# Patient Record
Sex: Female | Born: 1945
Health system: Southern US, Community
[De-identification: ages and names within clinical notes are randomized; demographics above are authoritative.]

## PROBLEM LIST (undated history)

## (undated) DIAGNOSIS — M5030 Other cervical disc degeneration, unspecified cervical region: Secondary | ICD-10-CM

## (undated) DIAGNOSIS — R112 Nausea with vomiting, unspecified: Secondary | ICD-10-CM

## (undated) DIAGNOSIS — M542 Cervicalgia: Secondary | ICD-10-CM

## (undated) DIAGNOSIS — J45909 Unspecified asthma, uncomplicated: Secondary | ICD-10-CM

## (undated) DIAGNOSIS — Z9889 Other specified postprocedural states: Secondary | ICD-10-CM

## (undated) DIAGNOSIS — F329 Major depressive disorder, single episode, unspecified: Secondary | ICD-10-CM

## (undated) DIAGNOSIS — M8580 Other specified disorders of bone density and structure, unspecified site: Secondary | ICD-10-CM

## (undated) DIAGNOSIS — R42 Dizziness and giddiness: Secondary | ICD-10-CM

## (undated) DIAGNOSIS — F419 Anxiety disorder, unspecified: Secondary | ICD-10-CM

## (undated) DIAGNOSIS — K602 Anal fissure, unspecified: Secondary | ICD-10-CM

## (undated) DIAGNOSIS — K648 Other hemorrhoids: Secondary | ICD-10-CM

## (undated) DIAGNOSIS — K449 Diaphragmatic hernia without obstruction or gangrene: Secondary | ICD-10-CM

## (undated) DIAGNOSIS — I10 Essential (primary) hypertension: Secondary | ICD-10-CM

## (undated) DIAGNOSIS — K209 Esophagitis, unspecified without bleeding: Secondary | ICD-10-CM

## (undated) DIAGNOSIS — K635 Polyp of colon: Secondary | ICD-10-CM

## (undated) DIAGNOSIS — K5790 Diverticulosis of intestine, part unspecified, without perforation or abscess without bleeding: Secondary | ICD-10-CM

## (undated) DIAGNOSIS — Z87898 Personal history of other specified conditions: Secondary | ICD-10-CM

## (undated) DIAGNOSIS — F32A Depression, unspecified: Secondary | ICD-10-CM

## (undated) HISTORY — DX: Other specified disorders of bone density and structure, unspecified site: M85.80

## (undated) HISTORY — DX: Unspecified asthma, uncomplicated: J45.909

## (undated) HISTORY — PX: CATARACT EXTRACTION, BILATERAL: SHX1313

## (undated) HISTORY — PX: HEMORRHOID SURGERY: SHX153

## (undated) HISTORY — DX: Anxiety disorder, unspecified: F41.9

## (undated) HISTORY — DX: Other hemorrhoids: K64.8

## (undated) HISTORY — DX: Cervicalgia: M54.2

## (undated) HISTORY — DX: Dizziness and giddiness: R42

## (undated) HISTORY — DX: Personal history of other specified conditions: Z87.898

## (undated) HISTORY — DX: Other cervical disc degeneration, unspecified cervical region: M50.30

## (undated) HISTORY — PX: LASIK: SHX215

## (undated) HISTORY — DX: Major depressive disorder, single episode, unspecified: F32.9

## (undated) HISTORY — DX: Nausea with vomiting, unspecified: R11.2

## (undated) HISTORY — DX: Other specified postprocedural states: Z98.890

## (undated) HISTORY — PX: KNEE ARTHROSCOPY: SHX127

## (undated) HISTORY — DX: Diaphragmatic hernia without obstruction or gangrene: K44.9

## (undated) HISTORY — DX: Diverticulosis of intestine, part unspecified, without perforation or abscess without bleeding: K57.90

## (undated) HISTORY — DX: Anal fissure, unspecified: K60.2

## (undated) HISTORY — DX: Polyp of colon: K63.5

## (undated) HISTORY — PX: NECK SURGERY: SHX720

## (undated) HISTORY — PX: TONSILLECTOMY: SUR1361

## (undated) HISTORY — DX: Esophagitis, unspecified without bleeding: K20.90

## (undated) HISTORY — DX: Depression, unspecified: F32.A

---

## 1984-02-29 HISTORY — PX: NASAL SEPTOPLASTY W/ TURBINOPLASTY: SHX2070

## 1998-01-06 ENCOUNTER — Other Ambulatory Visit: Admission: RE | Admit: 1998-01-06 | Discharge: 1998-01-06 | Payer: Self-pay | Admitting: Obstetrics and Gynecology

## 1999-02-01 ENCOUNTER — Encounter: Admission: RE | Admit: 1999-02-01 | Discharge: 1999-02-01 | Payer: Self-pay | Admitting: Family Medicine

## 1999-02-01 ENCOUNTER — Encounter: Payer: Self-pay | Admitting: Family Medicine

## 1999-05-17 ENCOUNTER — Encounter: Admission: RE | Admit: 1999-05-17 | Discharge: 1999-05-17 | Payer: Self-pay | Admitting: Family Medicine

## 1999-05-17 ENCOUNTER — Encounter: Payer: Self-pay | Admitting: Family Medicine

## 2000-02-02 ENCOUNTER — Encounter: Admission: RE | Admit: 2000-02-02 | Discharge: 2000-02-02 | Payer: Self-pay | Admitting: Family Medicine

## 2000-02-02 ENCOUNTER — Encounter: Payer: Self-pay | Admitting: Family Medicine

## 2001-01-10 ENCOUNTER — Encounter: Payer: Self-pay | Admitting: Internal Medicine

## 2001-01-10 ENCOUNTER — Encounter: Admission: RE | Admit: 2001-01-10 | Discharge: 2001-01-10 | Payer: Self-pay | Admitting: Internal Medicine

## 2001-03-28 ENCOUNTER — Encounter: Admission: RE | Admit: 2001-03-28 | Discharge: 2001-03-28 | Payer: Self-pay | Admitting: Internal Medicine

## 2001-03-28 ENCOUNTER — Encounter: Payer: Self-pay | Admitting: Internal Medicine

## 2001-04-17 ENCOUNTER — Encounter: Payer: Self-pay | Admitting: Neurosurgery

## 2001-04-17 ENCOUNTER — Inpatient Hospital Stay (HOSPITAL_COMMUNITY): Admission: RE | Admit: 2001-04-17 | Discharge: 2001-04-18 | Payer: Self-pay | Admitting: Neurosurgery

## 2001-04-30 ENCOUNTER — Encounter: Admission: RE | Admit: 2001-04-30 | Discharge: 2001-04-30 | Payer: Self-pay | Admitting: Internal Medicine

## 2001-04-30 ENCOUNTER — Encounter: Payer: Self-pay | Admitting: Internal Medicine

## 2001-12-03 ENCOUNTER — Other Ambulatory Visit: Admission: RE | Admit: 2001-12-03 | Discharge: 2001-12-03 | Payer: Self-pay | Admitting: Obstetrics and Gynecology

## 2002-09-26 ENCOUNTER — Encounter: Payer: Self-pay | Admitting: Internal Medicine

## 2002-09-26 ENCOUNTER — Encounter: Admission: RE | Admit: 2002-09-26 | Discharge: 2002-09-26 | Payer: Self-pay | Admitting: Internal Medicine

## 2002-12-04 ENCOUNTER — Encounter: Payer: Self-pay | Admitting: Radiology

## 2002-12-04 ENCOUNTER — Encounter: Admission: RE | Admit: 2002-12-04 | Discharge: 2002-12-04 | Payer: Self-pay | Admitting: Orthopaedic Surgery

## 2002-12-20 ENCOUNTER — Encounter: Admission: RE | Admit: 2002-12-20 | Discharge: 2002-12-20 | Payer: Self-pay | Admitting: Orthopaedic Surgery

## 2003-01-17 ENCOUNTER — Other Ambulatory Visit: Admission: RE | Admit: 2003-01-17 | Discharge: 2003-01-17 | Payer: Self-pay | Admitting: Obstetrics and Gynecology

## 2003-01-29 ENCOUNTER — Encounter: Admission: RE | Admit: 2003-01-29 | Discharge: 2003-01-29 | Payer: Self-pay | Admitting: Orthopaedic Surgery

## 2003-05-26 ENCOUNTER — Encounter: Admission: RE | Admit: 2003-05-26 | Discharge: 2003-05-26 | Payer: Self-pay | Admitting: Neurosurgery

## 2003-06-23 ENCOUNTER — Ambulatory Visit (HOSPITAL_COMMUNITY): Admission: RE | Admit: 2003-06-23 | Discharge: 2003-06-24 | Payer: Self-pay | Admitting: Neurosurgery

## 2004-03-16 ENCOUNTER — Other Ambulatory Visit: Admission: RE | Admit: 2004-03-16 | Discharge: 2004-03-16 | Payer: Self-pay | Admitting: Obstetrics and Gynecology

## 2004-04-02 ENCOUNTER — Encounter: Admission: RE | Admit: 2004-04-02 | Discharge: 2004-04-02 | Payer: Self-pay | Admitting: Obstetrics and Gynecology

## 2004-04-21 ENCOUNTER — Encounter: Admission: RE | Admit: 2004-04-21 | Discharge: 2004-04-21 | Payer: Self-pay | Admitting: Obstetrics and Gynecology

## 2005-05-11 ENCOUNTER — Encounter: Admission: RE | Admit: 2005-05-11 | Discharge: 2005-05-11 | Payer: Self-pay | Admitting: Internal Medicine

## 2005-06-01 ENCOUNTER — Encounter: Admission: RE | Admit: 2005-06-01 | Discharge: 2005-06-01 | Payer: Self-pay | Admitting: Internal Medicine

## 2005-06-27 ENCOUNTER — Other Ambulatory Visit: Admission: RE | Admit: 2005-06-27 | Discharge: 2005-06-27 | Payer: Self-pay | Admitting: Obstetrics & Gynecology

## 2006-01-12 ENCOUNTER — Encounter: Admission: RE | Admit: 2006-01-12 | Discharge: 2006-01-12 | Payer: Self-pay | Admitting: Specialist

## 2006-03-20 ENCOUNTER — Encounter: Admission: RE | Admit: 2006-03-20 | Discharge: 2006-03-20 | Payer: Self-pay | Admitting: Internal Medicine

## 2006-07-06 ENCOUNTER — Encounter: Admission: RE | Admit: 2006-07-06 | Discharge: 2006-07-06 | Payer: Self-pay | Admitting: Internal Medicine

## 2006-07-10 ENCOUNTER — Other Ambulatory Visit: Admission: RE | Admit: 2006-07-10 | Discharge: 2006-07-10 | Payer: Self-pay | Admitting: Obstetrics & Gynecology

## 2006-12-26 ENCOUNTER — Ambulatory Visit: Payer: Self-pay | Admitting: Physical Medicine and Rehabilitation

## 2006-12-26 ENCOUNTER — Encounter
Admission: RE | Admit: 2006-12-26 | Discharge: 2007-03-26 | Payer: Self-pay | Admitting: Physical Medicine and Rehabilitation

## 2006-12-27 ENCOUNTER — Ambulatory Visit (HOSPITAL_COMMUNITY)
Admission: RE | Admit: 2006-12-27 | Discharge: 2006-12-27 | Payer: Self-pay | Admitting: Physical Medicine and Rehabilitation

## 2007-01-30 ENCOUNTER — Encounter: Admission: RE | Admit: 2007-01-30 | Discharge: 2007-01-30 | Payer: Self-pay | Admitting: Obstetrics and Gynecology

## 2007-01-30 ENCOUNTER — Ambulatory Visit: Payer: Self-pay | Admitting: Physical Medicine and Rehabilitation

## 2007-02-13 ENCOUNTER — Ambulatory Visit: Payer: Self-pay | Admitting: Physical Medicine and Rehabilitation

## 2007-03-06 ENCOUNTER — Encounter
Admission: RE | Admit: 2007-03-06 | Discharge: 2007-06-04 | Payer: Self-pay | Admitting: Physical Medicine and Rehabilitation

## 2007-04-03 ENCOUNTER — Encounter
Admission: RE | Admit: 2007-04-03 | Discharge: 2007-05-23 | Payer: Self-pay | Admitting: Physical Medicine and Rehabilitation

## 2007-04-24 ENCOUNTER — Ambulatory Visit: Payer: Self-pay | Admitting: Physical Medicine and Rehabilitation

## 2007-05-28 ENCOUNTER — Encounter
Admission: RE | Admit: 2007-05-28 | Discharge: 2007-08-26 | Payer: Self-pay | Admitting: Physical Medicine and Rehabilitation

## 2007-05-30 ENCOUNTER — Ambulatory Visit: Payer: Self-pay | Admitting: Physical Medicine and Rehabilitation

## 2007-07-04 ENCOUNTER — Ambulatory Visit: Payer: Self-pay | Admitting: Physical Medicine and Rehabilitation

## 2007-07-12 ENCOUNTER — Other Ambulatory Visit: Admission: RE | Admit: 2007-07-12 | Discharge: 2007-07-12 | Payer: Self-pay | Admitting: Obstetrics & Gynecology

## 2007-08-14 ENCOUNTER — Encounter: Admission: RE | Admit: 2007-08-14 | Discharge: 2007-08-14 | Payer: Self-pay | Admitting: Internal Medicine

## 2007-08-29 HISTORY — PX: TOTAL HIP ARTHROPLASTY: SHX124

## 2007-09-03 ENCOUNTER — Inpatient Hospital Stay (HOSPITAL_COMMUNITY): Admission: AD | Admit: 2007-09-03 | Discharge: 2007-09-06 | Payer: Self-pay | Admitting: Orthopedic Surgery

## 2007-12-05 ENCOUNTER — Ambulatory Visit: Payer: Self-pay | Admitting: Pulmonary Disease

## 2007-12-05 DIAGNOSIS — R05 Cough: Secondary | ICD-10-CM

## 2007-12-05 DIAGNOSIS — J45909 Unspecified asthma, uncomplicated: Secondary | ICD-10-CM | POA: Insufficient documentation

## 2007-12-05 DIAGNOSIS — R059 Cough, unspecified: Secondary | ICD-10-CM | POA: Insufficient documentation

## 2008-01-08 ENCOUNTER — Encounter: Admission: RE | Admit: 2008-01-08 | Discharge: 2008-01-08 | Payer: Self-pay | Admitting: Orthopedic Surgery

## 2008-07-24 HISTORY — PX: REDUCTION MAMMAPLASTY: SUR839

## 2008-10-30 ENCOUNTER — Encounter: Admission: RE | Admit: 2008-10-30 | Discharge: 2008-10-30 | Payer: Self-pay | Admitting: Obstetrics and Gynecology

## 2008-11-06 ENCOUNTER — Encounter: Admission: RE | Admit: 2008-11-06 | Discharge: 2008-11-06 | Payer: Self-pay | Admitting: Obstetrics and Gynecology

## 2008-11-10 ENCOUNTER — Encounter: Admission: RE | Admit: 2008-11-10 | Discharge: 2008-11-10 | Payer: Self-pay | Admitting: Orthopedic Surgery

## 2009-08-28 HISTORY — PX: BREAST REDUCTION SURGERY: SHX8

## 2009-11-03 ENCOUNTER — Encounter: Admission: RE | Admit: 2009-11-03 | Discharge: 2009-11-03 | Payer: Self-pay | Admitting: Obstetrics and Gynecology

## 2010-03-21 ENCOUNTER — Encounter: Payer: Self-pay | Admitting: Orthopaedic Surgery

## 2010-03-21 ENCOUNTER — Encounter: Payer: Self-pay | Admitting: Internal Medicine

## 2010-03-21 ENCOUNTER — Encounter: Payer: Self-pay | Admitting: Obstetrics and Gynecology

## 2010-05-03 ENCOUNTER — Ambulatory Visit (INDEPENDENT_AMBULATORY_CARE_PROVIDER_SITE_OTHER): Payer: PRIVATE HEALTH INSURANCE | Admitting: Family Medicine

## 2010-05-03 DIAGNOSIS — R059 Cough, unspecified: Secondary | ICD-10-CM

## 2010-05-03 DIAGNOSIS — R05 Cough: Secondary | ICD-10-CM

## 2010-05-03 DIAGNOSIS — J209 Acute bronchitis, unspecified: Secondary | ICD-10-CM

## 2010-06-02 ENCOUNTER — Ambulatory Visit: Payer: PRIVATE HEALTH INSURANCE | Admitting: Family Medicine

## 2010-07-05 ENCOUNTER — Ambulatory Visit (INDEPENDENT_AMBULATORY_CARE_PROVIDER_SITE_OTHER): Payer: PRIVATE HEALTH INSURANCE | Admitting: Family Medicine

## 2010-07-05 ENCOUNTER — Other Ambulatory Visit: Payer: Self-pay | Admitting: Family Medicine

## 2010-07-05 DIAGNOSIS — R5383 Other fatigue: Secondary | ICD-10-CM

## 2010-07-05 DIAGNOSIS — R5381 Other malaise: Secondary | ICD-10-CM

## 2010-07-05 DIAGNOSIS — E559 Vitamin D deficiency, unspecified: Secondary | ICD-10-CM

## 2010-07-05 DIAGNOSIS — N390 Urinary tract infection, site not specified: Secondary | ICD-10-CM

## 2010-07-07 ENCOUNTER — Encounter: Payer: Self-pay | Admitting: *Deleted

## 2010-07-07 ENCOUNTER — Telehealth: Payer: Self-pay | Admitting: *Deleted

## 2010-07-07 NOTE — Telephone Encounter (Signed)
Pt notified that urine culture was negative, no evidence of UTI per Dr.Knapp.

## 2010-07-07 NOTE — Telephone Encounter (Signed)
By  Dennard Nip

## 2010-07-07 NOTE — Telephone Encounter (Signed)
Left message on pt's vm that her labs were all WNL.

## 2010-07-13 NOTE — Assessment & Plan Note (Signed)
[name] is a 65 year old, married female who is kindly referred by Dr.  Alveda Reasons.  She has been referred to our [facility] for  multiple pain complaints.  She has a history of a cervical fusion at C4-  5 in 2002.  She has a history of cervicalgia as well as some left  shoulder pain, milder right shoulder pain, and some left buttock and  lateral hip pain which radiates to the lateral knee, sometimes down to  the midcalf region.   She gives a history of a fall onto her left hip several years ago and  attributes much of her hip pain to this fall.   She has undergone MRI back in 02/2005 which showed some mild  degenerative changes bilaterally without any evidence of occult fracture  or AVN.   She has had the hip injected as well once into the trochanteric region  which she reports gave her a little relief for awhile.   She is known to have cervical spondylosis at C5-6 as well as C6-7.  X-  rays done by Dr. Alveda Reasons referring to his note also showed that she had a  solid arthrodesis at C4-5 with moderate degenerative changes at C5-6 and  C6-7.   She states her average pain is about a 7 or an 8 on a scale of 10.  Currently in our office, she states she is between a 5 and a 6.  Her  pain in her left hip region is a deep ache and is constant whether she  is sitting or standing radiating to the knee area.  She also states that  when she is up walking, it does get worse, and she has had to limit her  walking over the last half of the year.   Her general activity is interfered with a lot with this pain as well as  into overall enjoyment of life.   She is able to sleep fairly well.  Her left shoulder pain she complains  of a clicking sensation in the shoulder joint itself combined with some  discomfort there as well.   The pain typically improves with rest, heat, ice.  She dose have a TENS  unit which she does not use frequently.  She states that her biggest  problem is  that she is, I am very hyper, and there is a lot I want to  do.  She may have some difficulty pacing her activities.   She denies problems with numbness or tingling.  She denies weakness in  the upper extremities.  She states she does feel like she has some  weakness around the hip in the left.  Denies any problems controlling  bowel or bladder.  Denies any balance problems.   She is able to climb stairs and drives.   FUNCTIONAL STATUS:  She is independent with all of her self care as well  as high level activities.  She participates in yoga as well as church  activities, book club, and overall very active individual.   She admits to some depression but denies suicidal ideation.   REVIEW OF SYSTEMS:  Otherwise noncontributory.   PHYSICIANS INVOLVED WITH HER CARE:  Dr. Channing Mutters, Dr. Thomasena Edis, Dr. Jeanie Sewer,  and Dr. Renae Fickle.   PAST MEDICAL HISTORY:  The patient denies any problems with diabetes,  ulcers, cancers, kidney problems, thyroid problems, heart problems, high  blood pressure, or liver problems.   PAST SURGICAL HISTORY:  Positive for neck fusion 2002, shoulder surgery  2003.   The patient is married.  She occasionally drinks.  Denies smoking or  illegal substance use.   FAMILY HISTORY:  Positive for heart disease and psychiatric problems.   MEDICATIONS:  She comes in with at the time of this evaluation include  lamotrigine 150 daily, Centrum, vitamin D, magnesium, vitamin B12, folic  acid, and Osteo-Bi-Flex.   SHE REPORTS ONE DRUG ALLERGY TO AN ANTIBIOTIC, BUT SHE CANNOT REMEMBER  THE NAME OF THE ANTIBIOTIC.  It was several years ago.   PHYSICAL EXAMINATION:  VITAL SIGNS:  On exam today, her blood pressure  is 147/71, pulse 71, respirations 18, 97% saturated on room air.  GENERAL:  She is a well-developed, well-nourished female who appears her  stated age.  She is oriented x3.  Her speech is clear.  Her affect is  bright.  She is alert.  She is cooperative and pleasant.  She  follows  commands without difficulty.  MUSCULOSKELETAL:  She transitions from sitting to standing quite easily.  Her gait in the room is normal.  Tandem gait and Romberg's test are  performed adequately.  Her cervical range of motion is quite good with  rotation as well as extension and flexion and without discomfort at this  time.  She has full shoulder range of motion bilaterally.  She does  complain of some pain and clicking in the left shoulder, and she seems  to have a little bit of subluxation of her acromioclavicular joint on  the left when she abducts the shoulder.  She is tender over this  acromioclavicular joint as well.  Lumbar motion is quite good.  She does  not have any complaints with pain in her back when she extends or flexes  or laterally flexes.  Her reflexes are symmetric and intact.  No  abnormal tone is noted in the upper or lower extremities.  Motor  strength is good in the lower extremities with the exception of hip  abduction on the left is approximately half a grade weaker than on the  right, and she has some EHL weakness on the right as well.  Sensory exam  is overall intact to light touch and pinprick and proprioception.  Straight leg raise is negative.  Internal and external rotation of her  hips at this time does not exacerbate her pain other than along the  left.  She does get some radiation from the area of the trochanter down  to the left knee with internal rotation.   IMPRESSION:  1. Left hip pain status post fall 3 years ago.  Had MRI left hip which      showed degenerative changes bilaterally __________ .  She had an      injection to the trochanteric bursa several months ago which seemed      to help a little bit for her.  She is tender down both iliotibial      bands.  However, she does get some pain which is constant whether      she is sitting or standing and is much worse with ambulation.      Sometimes, the pain does go into the left calf a  little bit for      her.  This progression down the leg has been occurring over the      last 6 months and seems to have gotten even worse for her in the      last 2 months.  She is frustrated by her ability  to ambulate at      this point.  2. Cervicalgia.  She has a history of a C4-5 fusion which is solid per      radiographs read by Dr. Alveda Reasons with spondylotic changes at C5-6 and      C6-7.  Her pain in this region comes and goes.  She does wear a      cervical collar and uses a TENS unit intermittently and uses      ibuprofen 400 mg occasionally in the morning, not particularly with      any frequency at this point, however.  3. Left shoulder pain with tenderness over the acromioclavicular joint      and some subluxation during abduction noted on exam.  She does,      however, have excellent range of motion in both shoulders at this      point without any weakness.   PLAN:  We will check lumbar films to see if she is getting some  narrowing at L5-S1.  May move to MRI if she continues to have gait  difficulties.  We will trial her with some Lidoderm and Ultracet 1  tablet p.o. daily.  At next visit, we will get her into some physical  therapy to work on her iliotibial bands, some ultrasound on the  iliotibial bands, as well as icing on the trochanteric region.  May also  consider some shoulder radiographs, possibly left acromioclavicular  joint injection for this area which has also been giving her trouble.  I  will see her back in a month.  Risks and benefits of these medications were reviewed with her.  She was  given some samples of Lidoderm and also a prescription as well.           ______________________________  Brantley Stage, M.D.     DMK/MedQ  D:  12/27/2006 12:01:20  T:  12/27/2006 16:29:05  Job #:  [id number]   cc:   Maria Severe, MD  Fax: [phone]

## 2010-07-13 NOTE — Assessment & Plan Note (Signed)
[name] is a 65 year old, married woman, who is followed in our pain  and rehabilitative clinic.  She has multiple pain complaints, including  intermittent neck pain.  She is status post cervical spine surgery times  two, also has a history of left-shoulder problems and tendinitis.   Her main problem has been her left hip.  At her last visit with Dr.  Renae Fickle, she was told she has significant osteoarthritis of the left hip  and was offered hip replacement option.   She states her average pain is about a 7 on a scale of 10.  She gets a  little relief with current medications, prescribed by our clinic. Pain  is typically worse after she does some yoga or is up on her feet for a  prolonged period of time.   She has done some physical therapy.  However, she did not finish the  program; she missed several visits at the end.   She states she has started a water aerobic program and finds that to be  beneficial.   She is currently trialing a wheat-free diet, as well.   She is able to walk about 40 minutes at a time.  She has some difficulty  with stairs, especially descending them.  She is able to drive and she  is independent with her self-care and is a fairly high-functioning  woman.   She denies problems controlling bowel or bladder, denies tingling or  numbness, admits to some depression and anxiety, but denies suicidal  ideation.   REVIEW OF SYSTEMS:  Otherwise noncontributory.   PAST MEDICAL SOCIAL AND FAMILY HISTORY:  Unchanged from our previous  visits.   MEDICATIONS:  1. Medications from this clinic include Prilosec 20 mg daily.  2. Soma 350 one half to one tablet daily.  3. Naprosyn 500 mg one to two p.o. daily p.r.n.   EXAM:  Blood pressure is 130/76, pulse 73, respirations 18, 96%  saturated on room air.   GENERAL APPEARANCE:  She is well-developed, well-nourished female, who  does not appear in any distress.  She is smiling, cooperative, pleasant,  follows commands  easily.  She is oriented times three.  Speech is clear.  Her affect is bright.   She transitions from sitting to standing without difficulty.  Gait in  the room is nonantalgic.  Tandem gait and Romberg test are performed  adequately.  Heel-toe-walking is also performed adequately.   Mild limitations in lumbar extension are noted.  She has fairly well-  preserved range of motion in her neck.  She has full shoulder range of  motion without any pain behaviors today.   Reflexes are 2+ at the patella and Achilles tendons, tone is normal, no  clonus is noted.  Motor strength is good, 5/5 at hip flexors, knee  extensors, dorsiflexors, plantar-flexors, EHL.   No sensory deficits are appreciated.   Examination of the left hip reveals tenderness over the left trochanter  and especially midway down the iliotibial band.  She has some discomfort  at end range with internal rotation of the left hip.  External rotation  is not painful.  She has just mild limitations in internal rotation.   IMPRESSION:  1. Left-hip osteoarthritis.  2. Left trochanteric bursitis iliotibial band syndrome.  3. Mild degenerative disc disease, lumbar spine.  4. Cervical spondylosis, status post surgery times two, last surgery      2004.   PLAN:  I encouraged her to continue in her water aerobics program at  the  [employer], which she is currently attending.  I spent 45 minutes  with her today.  I have also reviewed various pain medication options  for her to help manage her pain.  She will continue with Naprosyn 500 mg  one p.o. b.i.d. p.r.n. left-hip pain #60 given to her today.  We will  also give her Prilosec 20 mg daily #30 with three refills.  Discussed  the use of Ultracet and acetaminophen with her.  We will give her one to  two tablets a day of Ultracet #60 per month, which she can take on a  p.r.n. basis, as well, and Soma 350 mg daily p.r.n.   I have also given her a pain log, which she can bring  back next time and  we can review which medications she has been taking and her activity  level.  She has been stable on the above medications.  Risks and  benefits of all these medications have been reviewed with her.  I have  answered all her questions.  She has not exhibited any aberrant  behavior.  We will see her back in a month.           ______________________________  Brantley Stage, M.D.     DMK/MedQ  D:  05/30/2007 09:43:26  T:  05/30/2007 10:04:29  Job #:  [id number]   cc:   Nelda Severe, MD  Fax: [phone]

## 2010-07-13 NOTE — Assessment & Plan Note (Signed)
HISTORY:  Ms. Liska is a 65 year old married female who is followed in  our pain and rehabilitative clinic for multiple chronic pain complaints  including left shoulder pain, low back pain and left lateral hip pain.  She also has some new complaints of bilateral foot pain which had its  onset in the last couple of weeks.  She has been working at the [employer] and has been on her feet a bit more.  She is  complaining of some swelling and has pain in the heel area of her foot  as well as in the area of the metatarsal heads bilaterally.  Her average  pain is about 6-7 on a scale of 10.  Pain is described as constant,  tingling, aching, burning.  Her pain in her feet is worse when she first  gets out of bed and improves as the day goes on.  She is able to walk at  least 30 minutes at a time.  She has some difficulty with stairs.  She  is able to drive.  She is independent with her self-care.  She is  complaining of some hand and foot swelling.   No other changes in past medical history, social or family history.   MEDICATIONS PROVIDED BY THIS CLINIC:  1. Prilosec 20 mg daily.  2. Carisoprodol 350 mg 1/2 to 1 tablet daily.  3. Naprosyn 500 mg 1/2 to 1 tablet p.o. b.i.d.  4. Ultracet 1-2 tablets p.o. daily.   PHYSICAL EXAMINATION:  VITAL SIGNS:  Blood pressure 141/86, pulse 26,  respirations 20, 97% saturated on room air.  GENERAL:  She is a well-developed, well-nourished female who actually  appears younger than her stated age.  She is oriented x3.  Speech is  clear.  Her affect is bright.  She is alert, cooperative and pleasant.  She follows commands without difficulty.  EXTREMITIES:  Transitioning from sitting to standing is done with ease.  Gait in the room is slightly antalgic.  Decreased weightbearing in the  left lower extremity.  Very slightly however, it is mildly asymmetric  gait.  Seated reflexes are symmetric and intact in the lower  extremities.  Motor  strength is quite good.  Tenderness is noted over  the left trochanter and down the iliotibial band.  She also has  tenderness at the metatarsal heads bilaterally and also tender along the  plantar fascia bilaterally from the medial heel throughout the mid arch  area.   IMPRESSION:  1. New bilateral mild plantar fasciitis.  2. Left trochanteric bursitis.  3. Mild degenerative disk disease of the lumbar spine.  4. Cervical spondylosis status post surgery x2, last surgery was 2004.   DISCHARGE MEDICATIONS:  We will refill the following medications for her  today:  1. Carisoprodol 350 mg 1 p.o. daily p.r.n. back spasm, number 30, no      refills.  2. Ultracet 1-2 p.o. daily p.r.n. hip pan, number 60.  3. Naprosyn 500 mg 1 p.o. daily b.i.d., number 60, 2 refills on each      of these.  4. Ambien 10 mg 1 p.o. q.h.s. p.r.n. insomnia, number 10, no refills      as she did relate some trouble sleeping with the left hip recently.   PLAN:  I have encouraged her to wear supportive shoes when she is up on  her feet more.  Consider supportive stockings also when she is on her  feet a good part of the day.  I also recommended adding a heel cup  bilaterally.  We will see her back in 2 months.  She will let me know if  she needs to be seen any sooner than that.           ______________________________  Brantley Stage, M.D.     DMK/MedQ  D:  07/04/2007 09:34:36  T:  07/04/2007 10:13:27  Job #:  [id number]

## 2010-07-13 NOTE — Assessment & Plan Note (Signed)
[name] is a 65 year old married female, who is followed in our pain  and rehabilitative clinic with multiple chronic pain complaints,  including a remote history of fibromyalgia, cervicalgia and left-  shoulder pain.   She underwent some trigger point injections on December 16, which have  not been significantly helpful in ameliorating her left-shoulder pain.   She continues to have some discomfort in the parascapular region on the  left, as well as in the left acromioclavicular region.   Average pain is about a 7 on a scale of 10, worse with movement.  A good  part of her pain is fairly constant and aching and burning in nature.   Her sleep is fairly good.  She is getting good relief with current  medications that she is on.  She reports overall improvement in her hip  pain, which has been a problem for her for several months and she  reports she is able to now do some exercise.  She has been walking and  can walk up to 30 minutes.   Pain interferes a lot with general activity, moderately with  relationships with others.   She is independent with her self-care.  She is a high-functioning  individual, independent with all ADLs and higher-level household  activities.   REVIEW OF SYSTEMS:  Unchanged since last visit.   PAST MEDICAL SOCIAL FAMILY HISTORY:  Unchanged, as well.   No new medications are reported, no medical problems have been reported  in the interim.   MEDICATIONS PROVIDED BY THIS CLINIC INCLUDE:  1. Naprosyn 500 mg one p.o. b.i.d. for 14 days a month.  However, she      is currently taking it just once a day on a p.r.n. basis.  2. She also uses Prilosec 20 mg one p.o. daily.  3. Soma 350 mg a half tablet q.h.s. on a p.r.n. basis, as well.   EXAM:  Her blood pressure is 121/56, pulse 82, respirations 18, 96%  saturated on room air.  She is a well-developed, well-nourished female, who does not appear in  any distress.  She is oriented times three.  Her speech  is clear.  Her  affect is bright.  She is alert, cooperative and pleasant.  She follows  commands without any difficulty.   Transitioning from sitting to standing is done quite easily.  Her gait  in the room is normal.  Tandem gait and Romberg test are all performed  adequately.  She has some limitations in her range of motion in her  neck, especially with rotation to the left.  She has full shoulder range  of motion.   Reflexes are symmetric and intact in the upper and lower extremities.  No abnormal tone is noted, no clonus is noted.  She has full strength in  upper and lower extremities without focal deficit.  No sensory deficits  are appreciated, as well.  She has exquisite tenderness over the left  acromioclavicular joint.  She does have some crepitus with internal and  external rotation of the left shoulder joint.   Her range of motion is fairly well-maintained, however.   IMPRESSION:  1. Left-shoulder pain with tenderness over the acromioclavicular      joint, consistent with some mild to moderate acromioclavicular      osteoarthritis.  2. Overall improvement in her left hip trochanteric bursitis and      iliotibial band syndrome.  3. Cervical spondylosis, status post fusion at C4-5 with radiation of  some pain into the scapular region.  4. Mild lumbar degenerative disc disease.  5. Mild osteoarthritis.   PLAN:  We will trial her on Neurontin in an attempt to alleviate some of  the pain into the periscapular region.  We will start with 100 mg one  p.o. q.h.s. times three days, then one p.o. b.i.d. times three days,  then one p.o. t.i.d. times three days and then one p.o. q.i.d. times  three days.  We will also refill her Naprosyn for her 500 mg one p.o.  daily on a p.r.n. basis #30 per month.  She does not need a refill on  her Carisoprodol today, her Soma.  She has enough Prilosec, as well.   I would also like her to get involved in a physical therapy program,   emphasis on scapular stabilization exercises and some stretches over the  iliotibial band area to help prevent her recurrence of the trochanteric  bursitis and iliotibial band syndrome.  Would like to progress her  eventually to more of a conditioning type program, which is what her  main goals are.  She would like to eventually get into some weight-loss  work.  She has been stable on the Naprosyn.  No abdominal complaints are  reported by her.  She understands the risks and benefits of taking  Naprosyn and finds that she is much more functional taking one Naprosyn  on a p.r.n. basis each day.   We will see her back in a month.           ______________________________  Brantley Stage, M.D.     DMK/MedQ  D:  03/07/2007 14:43:40  T:  03/07/2007 15:31:12  Job #:  [id number]

## 2010-07-13 NOTE — H&P (Signed)
[name], [name]                  ACCOUNT NO.:  [id number]   MEDICAL RECORD NO.:  [id number]         PATIENT TYPE:  LINP   LOCATION:                               FACILITY:  [facility]   PHYSICIAN:  Madlyn Frankel. Charlann Boxer, M.D.  DATE OF BIRTH:  26-Jun-1945   DATE OF ADMISSION:  09/03/2007  DATE OF DISCHARGE:                              HISTORY & PHYSICAL   PROCEDURE:  Left total hip arthroplasty.   CHIEF COMPLAINT:  Left hip pain.   HISTORY OF PRESENT ILLNESS:  A 65 year old female with a history of left  hip pain secondary to osteoarthritis.  It has been refractory to all  conservative treatment.  She has been presurgically assessed by her  primary care physician, Dr. Luanna Cole. [place].   PAST MEDICAL HISTORY:  Significant for:  1. Osteoarthritis.  2. Asthma.  3. Depression.  4. Degenerative disk disease.   PAST SURGICAL HISTORY:  Includes:  1. Neck surgery in 2001.  2. Shoulder surgery 2002.  3. Knee scope in the past.   FAMILY MEDICAL HISTORY:  Heart disease.   SOCIAL HISTORY:  She is married.  Primary caregiver will be her husband  after surgery.   DRUG ALLERGIES:  No known drug allergies.   MEDICATIONS:  Lamotrigine unknown dosage and frequency, verify with  patient at time of admission.   REVIEW OF SYSTEMS:  See HPI.   PHYSICAL EXAMINATION:  Pulse 64, respirations 18, blood pressure 155/90.  GENERAL:  Awake, alert and oriented, well-developed, well-nourished, no  acute distress.  NECK:  Supple.  No carotid bruits.  CHEST:  Lungs were clear to auscultation bilaterally.  BREASTS:  Deferred.  HEART:  Regular rate and rhythm.  S1-S2 distinct.  ABDOMEN:  Soft, nontender, nondistended.  Bowel sounds present.  GENITOURINARY:  Deferred.  EXTREMITIES:  Left hip has decreased range of motion and increased pain.  SKIN:  No cellulitis.  NEUROLOGIC:  Intact distal sensibilities.   IMPRESSION:  Left hip osteoarthritis.   PLAN OF ACTION:  Left total hip arthroplasty September 03, 2007 at  [facility], surgeon Dr. Durene Romans.  Risks and complications were  discussed.  Questions were encouraged, answered and reviewed.   Postoperative medications including Lovenox, Robaxin, iron, aspirin,  Colace, MiraLax provided at the time of history and physical.  Pain  medicines will be provided at the time of surgery.     ______________________________  Yetta Glassman [address], [place]      Madlyn Frankel. Charlann Boxer, M.D.  Electronically Signed    BLM/MEDQ  D:  08/20/2007  T:  08/20/2007  Job:  [id number]   cc:   Luanna Cole. Lenord Fellers, M.D.  Fax: [phone]

## 2010-07-13 NOTE — Assessment & Plan Note (Signed)
[name] is a 65 year old married female who is followed in our pain  and rehabilitative clinic for multiple chronic pain complaints including  cervicalgia/shoulder pain.  She has a history of cervical fusion at C4-5  and history of left rotator cuff surgery in the past.   She also has some low back pain and left lateral hip pain, which was  felt to be secondary to bursitis.  At the last visit, she was started on  some Naprosyn and she reports that, after the first day of Naprosyn, she  had an overall improvement in her lateral hip pain, and is quite pleased  about this.  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  She states her  average pain is about a 5/10.  Currently in the clinic, it is about a 2.  She is requesting trigger point injections into this area.   Activities tend to make the left shoulder pain a bit worse, improved  with heat and ice.   She has some limitations in walking.  She is able to climb stairs and  drive, however.  She is independent with her self care.  Denies  depression, anxiety, or suicidal ideations today.   PAST MEDICAL/SOCIAL/FAMILY HISTORY:  Unchanged.   MEDICATIONS PRESCRIBED BY THIS CLINIC:  1. Naprosyn 500 mg 1 p.o. b.i.d. #14 last month.  2. Prilosec 20 mg 1 p.o. daily p.r.n.   EXAM:  Her blood pressure is 142/95, pulse 78, respirations 18, 97%  saturation on room air.  She is a well-developed, well-nourished female who does not appear in  any distress.  She is oriented x3.  Speech is clear.  Affect is bright.  She is alert, cooperative, and pleasant.  She follows commands without  difficulty.   She is able to transition from sitting to standing quite easily.  gait  in the room is stable.  Tandem gait and Romberg test are performed  adequately.  She has limitations in cervical range of motion in all  planes, and  she has full shoulder range of motion, but does report some  discomfort in the left shoulder region, especially when she has some  popping in the left shoulder as well.  She has some tenderness along the  acromioclavicular joint.  No bicipital tenderness is appreciated.  She  has full range of motion with internal and external rotation, as well as  abduction with this shoulder.  She has multiple areas of tenderness  throughout the parascapular musculature, especially in the supraspinatus  region.   Reflexes are symmetric and intact in the upper and lower extremities.  No abnormal tone is noted.  No clonus is noted.  Motor strength is 5/5  in the upper and lower extremities.  No sensory abnormalities are  appreciated.   IMPRESSION:  1. Multiple tender points throughout the left parascapular muscles      today with full shoulder range of motion on the left.  2. Improvement overall in left hip trochanteric bursitis and      iliotibial band syndrome.  3. Cervical spondylosis status post fusion at C4-5.  4. Mild lumbar degenerative disk disease.  5. Mild osteoarthritis noted on x-ray 2 years ago.   PLAN:  The patient has done overall quite well with the Naprosyn for the  lateral hip pain.  She is having some complaints of some tender areas in  the parascapular muscles.  She would like to proceed with a trigger  point injection.  We reviewed treatment options for her at this point,  including doing nothing, continuing medications, considering trigger  point injections, which she would like to proceed with.  The specific  risks of this injection were reviewed with her, which include infection,  bleeding, increased pain, nerve damage, numbness, weakness, air in lung,  possibly requiring chest tube.   She would like to proceed with the injection.  1 mL of 1% lidocaine was  injected into 3 areas in the parascapular musculature without any  untoward effects overall.  She reported overall  improvement after the  injection.  These puncture wounds were covered with a sterile bandage.  Consent was signed as well.  She was given instructions for discharge.  A prescription for carisoprodol was written as well 350 mg 1/2 to 1  tablet p.o. daily #30 p.r.n. neck or shoulder spasm.  We will see her  back in a month.  She is stable on these medications.  Has no side  effects, such as abdominal complaints, dizziness.           ______________________________  Brantley Stage, M.D.     DMK/MedQ  D:  02/14/2007 11:55:03  T:  02/14/2007 17:01:31  Job #:  [id number]

## 2010-07-13 NOTE — Assessment & Plan Note (Signed)
[name] is a 65 year old married female who is being followed in our  pain and rehabilitative clinic for multiple pain complaints including  intermittent cervicalgia, left shoulder pain.  She has some left groin  pain as well as some left lateral hip pain which goes to the knee and  some low-back/left buttock pain as well.   Her biggest problem today, however, is her lateral hip pain which seems  to have gotten worse in the last few weeks.  She has a history of a fall  about 3 years ago onto this left hip.  She has had one round of physical  therapy to treat it back a couple of years ago, has not had any other  treatment.  She has had a recent injection several months ago which  seems to have helped a little bit and now she has had recurrence in the  pain.   Last month, lumbar radiographs were done which showed early degenerative  disk disease L4-5, L5-S1 indicated by early disk space loss at these  levels.  This was reviewed with her as well.   Her average pain is between a 6 and an 8 on a scale of 10.  She  describes it as aching and constant when she is up and walking.  It is  worse with walking, improves with rest, improves with heat and is  interfering quite a bit with her general activity currently.  She is  limited in her ability to ambulate, not much more than 15 minutes at  this point.  She is able to climb stairs and she is able to drive.  She  works as a housewife.  She is independent with her self-care and is able  to take care of her household activities.   She admits to some depression.  Denies suicidal ideation.   Review of systems otherwise negative.   She does follow up with Dr. Renae Fickle, was told that he can perform a hip  trochanteric bursectomy for her, which she is considering.   No other changes in social or family history since last visit.  Medications provided by our clinic currently include Ultracet on a  p.r.n. basis.   On exam today, her blood pressure  is 158/91, pulse 75, respirations 18,  97% saturated on room air.  She is a well-developed, well-nourished  female who does not appear in any distress.  She is oriented x3.  Her  speech is clear, her affect is bright.  She is alert, cooperative and  pleasant.  She follows commands without any problems.   She transitions from sitting to standing easily.  Her gait in the room  is slightly antalgic with decreased weightbearing and shortened stride  length on the left side.   Her balance is quite good.  However, lumbar motion is mildly limited.  Reflexes are symmetric and intact.  Motor strength is 5/5 in the lower  extremities at hip flexors, knee extensors, dorsiflexors, plantar  flexors, EHL.  Internal and external rotation about both right and left  hip do not exacerbate her pain in the groin at all.  She does complain  of some lateral discomfort with external rotation on the left.  Palpation over this left trochanter is quite tender and she does have  some tenderness which goes down the iliotibial band to the knee as well.   IMPRESSION:  1. The biggest problem today is left hip trochanteric bursitis and      iliotibial band syndrome.  2. Cervical spondylosis.  3. Status post cervical fusion at C4-5.  4. Mild lumbar degenerative disk disease.  5. Mild hip osteoarthritis noted on x-ray 2 years ago.   PLAN:  She states she has not had a trial of a nonsteroidal for several  months now.  She takes p.r.n. Aleve on occasional but not regularly.   I would like to start her on Naprosyn 500 mg one p.o. b.i.d. for at  least 7 days, give her a refill as well, and will start her on Prilosec  20 mg to go with that to protect her stomach while she is on it.  She is  using Lidoderm and finds it to be somewhat beneficial and finds the  Ultracet to be beneficial as well.  She really takes it rather  infrequently, not more than one tablet per day a couple of times per  week.  I have reviewed the  risks and benefits of the medications  prescribed with her.  She understands, would like to trial the Naprosyn.  We will see her back in 1 month.  I have also given her a prescription  for a lumbar support which she can use during household activities.  Apparently she got a new washing machine which is a front-loader and is  requiring her to do a bit more bending to get laundry in and out now.  I  reviewed proper body mechanics with her with respect to her lumbar spine  as well today.  Will see her back in 6-8 weeks.           ______________________________  Brantley Stage, M.D.     DMK/MedQ  D:  01/31/2007 11:51:54  T:  01/31/2007 14:23:19  Job #:  [id number]   cc:   Nelda Severe, MD  Fax: [phone]

## 2010-07-13 NOTE — Assessment & Plan Note (Signed)
[name] is back in our Pain and Rehabilitative Clinic today.  She has  two main complaints.  One is some left parascapular pain and also some  left lateral hip pain.   The lateral hip pain is new over the last several weeks.  She has  discontinued her anti-inflammatory medication for the most part.  She  has been using [facility].  She recently followed up with Dr. Renae Fickle who has  given her an option of surgery for the trochanteric bursitis.   [name] states her chief pain in her lateral hip region is actually  located about half way down her femur laterally, not as much over the  trochanter region.  Pain is worse with hip flexion.  [name] states  that she did have some recent x-rays with Dr. Renae Fickle and was told she has  significant hip arthritis on that side as well.   Her average pain is about a 7-8 on a scale of 10.  Pain is constant,  dull and aching in nature, worse with walking and bending, improves with  heat, medication and TENS unit.   She is currently involved in a rehabilitation program emphasizing  stabilization of the scapular region.  I would also like them to address  her bursitis and iliotibial band area and progress around towards a  cardiovascular conditioning program.   She is walking minimally at this time because of the lateral hip pain.  She is able to drive.  She is independent with her self-care.   REVIEW OF SYSTEMS:  Noncontributory.   PAST MEDICAL/SOCIAL/FAMILY HISTORY:  Noncontributory at this time.  No  changes are appreciated by the patient.   MEDICATIONS:  From this clinic include  1. Prilosec 20 mg daily.  2. Soma 350 one half to one tablet daily.  3. Naprosyn 500 one p.o. daily.  She has taken approximately five to      six times over the last month.  4. Neurontin 100 mg one p.o. q.i.d.  She has entirely stopped this      medication.   PHYSICAL EXAMINATION:  VITAL SIGNS:  Blood pressure today is 131/80,  pulse 68, respirations 18, 98% saturated on  room air.  GENERAL APPEARANCE:  She is a well-developed, well-nourished female who  does not appear in any distress.  She is smiling.  She appears quite  comfortable.   She is oriented x3.  Her speech is clear.  Her affect is bright.  She is  alert, cooperative and pleasant.  She follows commands easily.  Transitioning from sitting to standing is done with ease.  She does have  some pain complaints, however, on the left hip region when she stands  up.  Her gait is nonantalgic.  Tandem gait, Romberg's tests are  performed adequately.  Heel-to-toe walking is performed adequately.  Mild limitations with lumbar extension are noted.  She has approximately  70 degrees of forward flexion, reports some discomfort at end range.   Reflexes are 2+ in the patellar and Achilles tendons.  Normal tone is  noted.  No clonus is noted.  Motor strength is quite good, 5/5 at hip  flexors, knee extensors, dorsiflexors, plantar flexors.  No sensory  deficits are noted.   Left lateral hip is evaluated.  Internal rotation does bother her mildly  at end range.  External rotation does not bother her that much in either  hip.  Left  hip is mildly tender along the trochanter, however, most of  her  tenderness is noted about half way down the iliotibial band  approximately at mid femur.   IMPRESSION:  1. Left trochanteric bursitis/iliotibial band syndrome.  2. Left shoulder pain with tenderness over the acromioclavicular joint      in the past.  She may have some mild tendinitis as well.  3. Cervical spondylosis status post fusion at C4-5 with radiation of      some pain into the scapular region.  4. History of mild degenerative disk disease.  5. Mild osteoarthritis.   PLAN:  Will give her a prescription for Naprosyn again, 500 one p.o.  b.i.d. for left hip pain. I told her I would like her to take it twice a  day for 10 days and then go p.r.n. again with it.  However, if she does  have any abdominal  complaints while she takes this, she will need to  discontinue it.  I have also given her a prescription for Prilosec to go  with it one p.o. daily #30 with three refills.  She does not need  refills on her Soma or her Neurontin at this time.  I encouraged her to  continue in the rehabilitation program.  She is anxious to get involved  in an aerobic conditioning program to help with weight loss.  I will see  her back in four to five weeks.           ______________________________  Brantley Stage, M.D.     DMK/MedQ  D:  04/25/2007 12:54:13  T:  04/26/2007 08:57:40  Job #:  [id number]

## 2010-07-13 NOTE — Op Note (Signed)
NAMENELLINE, LIO                  ACCOUNT NO.:  [id number]   MEDICAL RECORD NO.:  [id number]          PATIENT TYPE:  INP   LOCATION:  0003                         FACILITY:  [facility]   PHYSICIAN:  Madlyn Frankel. Charlann Boxer, M.D.  DATE OF BIRTH:  1945/03/31   DATE OF PROCEDURE:  09/03/2007  DATE OF DISCHARGE:                               OPERATIVE REPORT   PREOPERATIVE DIAGNOSIS:  Left hip osteoarthritis.   POSTOPERATIVE DIAGNOSIS:  Left hip osteoarthritis.   PROCEDURE:  Left total hip replacement.   COMPONENTS USED:  DePuy hip system, size 50 Pinnacle cup, 36 metal  liner, a size three standard Tri-Lock stem, with a 36, 1.5 metal ball.   SURGEON:  Madlyn Frankel. Charlann Boxer, M.D.   ASSISTANT:  Yetta Glassman. Mann, PA.   ANESTHESIA:  Spinal.   BLOOD LOSS:  100 mL.   COMPLICATIONS:  None.   DRAINS:  x1.   INDICATIONS FOR PROCEDURE:  Ms. Herbel is a 65 year old female who  presented for evaluation of some posterolateral and anterolateral hip  pain.  She had had evidence of some problems with her back in the past,  but had radiographic evidence of an end-stage left hip osteoarthritis.  She had had some lateral hip complaints as well, and we reviewed the  differential diagnosis and the series of problems that was going on.  Based on the radiographic appearance and the persistence of her  discomfort for some time, I honestly feel that she was having problems  mainly with this left hip was, and that it was probably manifesting in  some other dimension.  Nonetheless, we reviewed the standard risks of  infection, DVT, component failure, dislocation, need for revision  surgery, and discussed the varying surfaces.  Risks and benefits were  discussed, and consent was obtained.   PROCEDURE IN DETAIL:  The patient was brought to the operative theater.  Once adequate anesthesia and preoperative antibiotics of Ancef were  administered, the patient was positioned in the right lateral decubitus  position, with  the left side up.  The left lower extremity was  prescrubbed, then prepped and draped in a sterile fashion.  A lateral-  based incision was made for a posterior approach to the hip.  The  iliotibial band and gluteal fascia were then incised posteriorly.  The  short external rotators were taken down separately to the posterior  capsule.  Initially I preserved the posterior capsule for potential  repair at the end of the case, but also to protect the sciatic nerve  from retractors.  The hip was dislocated.  Using a standard neck option  and a femoral head, I measured the neck cut based off the anatomic  landmarks.   At this point, attention was directed to the femur.  I used the femoral  canal initiator, followed by hand reamer once with irrigation.  I then  broached up to a size 3, which sat at the level of my neck cut.  I then  packed off the femur, then removed significant oozing, and attended to  the acetabulum.  Acetabular exposure was obtained.  The labrum was  removed.  I began reaming with a 43 reamer down to the medial wall, and  then reamed up to a 49 reamer.   I impacted a 50 mm cup.  It was positioned anatomically between the  ischium and the anterior wall.  There was some cup exposed  posterolateral.  I placed two cancellous bone screw.  The cup position  was approximately 20 degrees of forward flexion, 35-40 degrees of  abduction.  Trial liner was placed.  Trial reduction was now carried out  with a 3 broach, standard neck, and a 36, 1.5 ball.  I was very happy  with the leg length, stability of the hip, about 1 mm of shuck, and no  evidence of impingement in forward flexion, rotation, extension, and  external rotation.  Given all these parameters, all of the trial  components were removed.  A central hole eliminator was placed, and the  final 36 metal liner impacted into position without difficulty or  impingement of the soft tissues.  The final 3 standard stem was then   impacted level to the level where the broach.  Based on this, a final 36  metal, 1.5 ball was impacted onto a dry trunnion.  The hip was irrigated  throughout the case, and again at this point I was unable to  reapproximate the posterior leaflets.  I resected a portion to prevent  impingement.  I then re-placed a medium Hemovac drain deep.  The patient  had complaints of lateral hip pain.  I did remove her bursa sac and did  recess the iliotibial band.  I then used #1 Vicryl to reapproximate the  remaining iliotibial band and the gluteal fascia.  The remainder of the  wound was closed in layers with 2-0 Vicryl and a running 4-0 Monocryl.  The hip was cleaned, dried, and dressed sterilely, with a Steri-Strips  and a sterile dressing.  She was brought to the recovery room and  tolerated the procedure well.      Madlyn Frankel Charlann Boxer, M.D.  Electronically Signed     MDO/MEDQ  D:  09/03/2007  T:  09/03/2007  Job:  [id number]

## 2010-07-16 NOTE — Op Note (Signed)
[place]  Patient:    [name], [name] Visit Number: [id number] MRN: [id number]          Service Type: SUR Location: 3000 [id number] Attending Physician:  Emeterio Reeve Dictated by:   Payton Doughty, M.D. Proc. Date: 04/17/01 Admit Date:  04/17/2001                             Operative Report  PREOPERATIVE DIAGNOSIS:  Herniated disk, C4-C5.  POSTOPERATIVE DIAGNOSIS:  Herniated disk, C4-C5.  OPERATIVE PROCEDURE:  C4-C5 anterior cervical diskectomy and fusion with _______ plate.  SURGEON:  Payton Doughty, M.D.  ANESTHESIA:  General endotracheal.  PREPARATION:  The skin was prepped with alcohol wipe.  COMPLICATION:  None.  DOCTOR ASSISTANT:  Cristi Loron, M.D.  NURSE ASSISTANT:  [facility].  BODY OF TEXT:  This is a 65 year old lady with left C5 radiculopathy and herniated disk at C4-C5.  She was taken to the operating room, placed supine and intubated, and placed supine on the operating table with neck extended in the halter head traction.  Following shave, prep, and drape in the usual fashion, skin was incised in the midline at the medial border of the sternocleidomastoid muscle.  The platysma was identified, elected, divided, and undermined.  The sternocleidomastoid was identified, and medial dissection revealed the carotid artery which was retracted laterally to the left. Trachea and esophagus were retracted laterally to the right exposing the bones of the anterior cervical spine.  Intraoperative x-ray conformed correctness of the level.  Dissection was then carried out at C4-C5 under gross observation. The operating microscope was then brought in, and microdissection technique used to complete the diskectomy, remove the posterior longitudinal ligament, and explore the neural foramina.  On the left side, the C5 root was significantly encumbered with a foraminal herniated disk.  This was removed without difficulty and then the neural  foramen carefully explored with a hook and found to be open.  The right side was unencumbered.  Following complete decompression, meticulous hemostasis was obtained with Thrombin-soaked Gelfoam that was not left.  A 7-mm bone graft was then fashioned from patellar allograft and tapped into place.  A 12-mm _______ plate was then placed with 12-mm screws, two in C4, two in C5.  Intraoperative x-rays showed good placement of bone graft, plate, and screws.  The wound was irrigated and hemostasis assured.  The platysma was reapproximated with 3-0 Vicryl in interrupted fashion.  Subcutaneous tissues were reapproximated with 3-0 Vicryl in interrupted fashion.  The skin was closed with 4-0 Vicryl in a running subcuticular fashion.  Benzoin and Steri-Strips were placed make occlusive wit Telfa and OpSite.  The patient was then placed in an Aspen collar and returned to the recovery room in good condition. Dictated by:   Payton Doughty, M.D. Attending Physician:  Emeterio Reeve DD:  04/17/01 TD:  04/17/01 Job: [id number]

## 2010-07-16 NOTE — Discharge Summary (Signed)
[name], [name]                  ACCOUNT NO.:  [id number]   MEDICAL RECORD NO.:  [id number]          PATIENT TYPE:  INP   LOCATION:  1618                         FACILITY:  [facility]   PHYSICIAN:  Madlyn Frankel. Charlann Boxer, M.D.  DATE OF BIRTH:  12/06/1945   DATE OF ADMISSION:  09/03/2007  DATE OF DISCHARGE:  09/06/2007                               DISCHARGE SUMMARY   ADMITTING DIAGNOSES:  1. Osteoarthritis.  2. Asthma.  3. Depression.  4. Degenerative disk disease.   DISCHARGE DIAGNOSES:  1. Osteoarthritis.  2. Asthma.  3. Depression.  4. Degenerative disk disease.   HISTORY OF PRESENT ILLNESS:  A 65 year old female with a history of left  hip pain secondary to osteoarthritis.  Refractory to all conservative  treatment.   CONSULTATION:  None.   PROCEDURE:  Left total hip arthroplasty by surgeon Dr. Durene Romans.  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   LABORATORY DATA:  CBC upon admission, hemoglobin 12.2, hematocrit 36,  platelets 307.  At the time of discharge, hemoglobin 9, hematocrit 26.2,  platelets 244.  White tell differential all within normal limits.  Coagulation all within normal limits.  Routine C-met upon admission,  sodium 137, potassium 4.5, glucose 96, creatinine 0.77.  At time of  discharge, sodium 134, potassium 4.1, glucose 141, creatinine 0.64.  Kidney function normal.  Calcium 8.1 at time of discharge.  UA  preadmission showed a small hemoglobin, moderate leukocyte esterase,  negative for nitrites.   CARDIOLOGY:  EKG normal sinus rhythm.   RADIOLOGY:  Chest two-view no active disease.   HOSPITAL COURSE:  The patient underwent left total hip arthroplasty,  admitted to orthopedic floor.  Her stay was unremarkable.  She remained  afebrile, hemodynamically stable.  Dressing was changed on a daily basis  after day one with no significant ooze or sign of infection.  Hemovac  was discontinued day one.  She was weightbearing as tolerated with  physical therapy, occupational  therapy.  She had been on the PC  overnight, we stopped on day #1.  Started oral medications.  She did  have a little bit of nausea and vomiting while in the hospital. We gave  her some Zofran with oral dissolving tablets which helped relieve the  nausea.  We changed her over to Vicodin for pain control.  Hep-Lock for  IV.  On day #3, she did well.  Nausea was controlled with Zofran.  She  was ready for discharge home.   DISCHARGE DISPOSITION:  Discharged home, stable and improved condition  with Lovenox DVT prophylaxis.   DISCHARGE INSTRUCTIONS:  1. Activity:  Discharge physical therapy weightbearing as tolerated      with use of rolling walker.  2. Diet:  Regular.  3. Discharge wound care:  Keep wound dry.   DISCHARGE MEDICATIONS:  1. Lovenox 40 mg subcu q.24 x11 days.  2. Robaxin 500 mg p.o. q.6 h.  3. Enteric-coated aspirin 325 mg one p.o. daily x4 weeks after Lovenox      completed.  4. Iron 325 mg p.o. t.i.d. x2 weeks.  5. Colace 100 mg p.o.  b.i.d.  6. MiraLax 17 grams p.o. b.i.d.  7. Vicodin 5/325 one to two p.o. q. 4-6 p.r.n. pain.  8. Lamictal 150 mg one p.o. q.h.s.  9. Omeprazole 20 mg one p.o. q.a.m.  10.Osteo Bi-Flex two p.o. q.a.m.  11.Vitamin D 3000 units two every a.m.  12.Folic acid 800 mcg one p.o. q.a.m.  13.Multivitamin p.o. daily.   DISCHARGE FOLLOWUP:  Follow up with Dr. Charlann Boxer at phone number [phone] in  2 weeks.     ______________________________  Yetta Glassman [address], [place]      Madlyn Frankel. Charlann Boxer, M.D.  Electronically Signed    BLM/MEDQ  D:  10/02/2007  T:  10/02/2007  Job:  [id number]

## 2010-07-16 NOTE — Op Note (Signed)
NAMENYJAH, SCHWAKE                              ACCOUNT NO.:  [id number]   MEDICAL RECORD NO.:  [id number]                   PATIENT TYPE:  OIB   LOCATION:  2899                                 FACILITY:  [facility]   PHYSICIAN:  Kathaleen Maser. Pool, M.D.                 DATE OF BIRTH:  Dec 06, 1945   DATE OF PROCEDURE:  06/23/2003  DATE OF DISCHARGE:                                 OPERATIVE REPORT   PREOPERATIVE DIAGNOSIS:  Left C5-6 and C6-7 spondylosis with radiculopathy.   POSTOPERATIVE DIAGNOSIS:  Left C5-6 and C6-7 spondylosis with radiculopathy.   PROCEDURE:  Left C5-6 and left C6-7 laminotomy and foraminotomy with left C6-  7 microdiskectomy.   SURGEON:  Kathaleen Maser. Pool, M.D.   ASSISTANT:  Donalee Citrin, M.D.   ANESTHESIA:  General endotracheal.   INDICATIONS:  Ms. Peyser is a 65 year old female who is status post a  previous C4-5 anterior cervical diskectomy and fusion with allograft and  plating remotely in the past by another surgeon.  The patient awakened  postoperatively in reasonably good shape but however three to four days  after her shape developed bitter left upper extremity radicular pain which  has failed all efforts at conservative management.  These symptoms have been  ongoing for the past two years plus.  She has recently undergone a  myelogram, followed by a CT scan demonstrating significant spondylitic  compression of the exiting left-sided C6 and C7 nerve roots.  We discussed  the options available for management, including the possibility of  undergoing cervical foraminotomies and possible microdiskectomy at C6-7 for  hopeful improvement in her symptoms.  The patient is aware of the risks and  benefits and wishes to proceed.   OPERATIVE NOTE:  The patient was taken to the operating room and placed on  the table in supine position.  After an adequate level of anesthesia was  achieved, the patient was placed prone onto flat bolsters with her head  fixed in the neutral  head position and [place] pin head rest.  The  patient's posterior cervical region was prepped and draped sterilely.  A 10  blade was used to make a linear skin incision overlying C5, C6, and C7.  This was carried down sharply in the midline.  Subperiosteal dissection was  then performed, exposing the laminae and facet joints at C5, C6, and C7.  A  deep self-retaining retractor was placed, intraoperative x-ray was taken,  and levels were confirmed.  Laminotomies and foraminotomies were then  performed using the high-speed drill and Kerrison rongeurs to remove the  inferior aspect of the lamina of C5, superior aspect of the lamina of C6,  inferior aspect of the lamina of C6, and superior aspect of the lamina of  C7, as well as the medial aspect of the C5-6 and C6-7 facet joints.  The  ligamentum flavum was then elevated and  resected in piecemeal fashion using  Kerrison rongeurs.  The underlying thecal sac was identified.  The proximal  aspect of the C6 and C7 nerve roots were identified.  These were followed up  laterally by undercutting the facet joints using Kerrison rongeurs.  The  microscope was brought into the field and used for microdissection of the C6  and C7 nerve roots as they exited to their foramina.  Wide foraminotomies  were then performed along the course of the nerve roots using Kerrison  rongeurs to fully decompress the exiting nerve roots.  The epidural space at  C6-7 was then explored by coagulating the epidural venous plexus.  The nerve  root was retracted somewhat inferiorly.  Disk herniation was encountered and  dissected free using a micro-nerve hook.  This was then resected using a  micropituitary rongeur.  All elements of the disk herniation were completely  resected.  At this point a very thorough decompression had been achieved of  both nerve roots.  There was no evidence of injury to the thecal sac or  nerve roots.  The wound was then irrigated with antibiotic  solution.  Gelfoam was placed topically for hemostasis and found to be good.  The  microscope and retractor system were removed.  Hemostasis in the muscle  achieved with electrocautery.  The wound was then closed in layers with  Vicryl sutures.  Steri-Strips and sterile dressing were applied.  There were  no apparent complications.  The patient tolerated the procedure well, and  she returns to the recovery room postop.                                               Henry A. Pool, M.D.    HAP/MEDQ  D:  06/23/2003  T:  06/24/2003  Job:  [id number]

## 2010-07-16 NOTE — H&P (Signed)
[place]  Patient:    Maria, Huerta Visit Number: [id number] MRN: [id number]          Service Type: SUR Location: [id number] Attending Physician:  Emeterio Reeve Dictated by:   Payton Doughty, M.D. Admit Date:  04/17/2001                           History and Physical  ADMITTING DIAGNOSIS:  Herniated disk, C4-5.  HISTORY OF PRESENT ILLNESS:  A 65 year old right-handed white girl, in late teens took a fall down some steps and had left shoulder and neck pain ever since.  It has been much worse over the past two years with pain in the suprascapular region radiating down to about the deltoid prominence.  It does not seem to get down her arm much, and there is no numbness in the hand.  It has been disconcerting for her.  It is constant.  She has participated in chiropractic therapy, tai chi, and massage therapy, and undergone a stellate block to no avail.  MRI was obtained that shows a disk at 4-5.  She wanted to think it over in October, called me up and said it is still killing her and wanted to get something done, so she is here for an anterior cervical diskectomy and fusion.  PAST MEDICAL HISTORY:  Otherwise benign.  MEDICATIONS:  She is on Fosamax and Neurontin.  She suffers from a bit of depression and takes Zoloft for that.  ALLERGIES:  She has no allergies.  PAST SURGICAL HISTORY:  Her right knee.  FAMILY HISTORY:  Her mom is 59, in fair health.  Daddy died in his late 58s of an MI.  SOCIAL HISTORY:  She does not smoke, drinks only socially.  Quit her job because her neck is bothering her.  REVIEW OF SYSTEMS:  Remarkable for back pain, arm pain, and neck pain.  PHYSICAL EXAMINATION:  HEENT:  Within normal limits.  NECK:  She has reasonable range of motion of her neck, does reproduce some of her left shoulder pain.  CHEST:  Clear.  CARDIAC:  Regular rate and rhythm.  ABDOMEN:  Nontender, no  hepatosplenomegaly.  EXTREMITIES:  Without clubbing, cyanosis.  Peripheral pulses are good.  GENITOURINARY:  Deferred.  NEUROLOGIC:  She is awake, alert and oriented.  Cranial nerves are intact. Motor exam shows 5/5 strength throughout the upper extremities, a fair amount of give-away weakness of the left deltoid.  There is no sensory deficit. Reflexes are 1 at the biceps and triceps, 1 at the brachioradialis.  Lower extremities are non-myelopathic.  DIAGNOSTIC STUDIES:  MRI shows spondylitic change at 5-6 that is very mild and an MR that shows spondylosis at 5-6 with neural foraminal narrowing on the right as well as the left C4-5 neural foraminal disk.  CLINICAL IMPRESSION:  Mild left C5 radiculopathy related to disk disease.  The decision-making is reviewed above.  I told her without a deficit she did not have to have an operation, but she is tired of the pain and wants to do something.  PLAN:  Anterior cervical diskectomy and fusion at 4-5 with a Tether plate. The risks and benefits of this approach have been discussed with her, and she wishes to proceed. Dictated by:   Payton Doughty, M.D. Attending Physician:  Emeterio Reeve DD:  04/17/01 TD:  04/17/01 Job: [id number]

## 2010-11-25 LAB — BASIC METABOLIC PANEL
BUN: 11
BUN: 7
CO2: 29
Calcium: 9.2
Chloride: 101
Chloride: 102
Creatinine, Ser: 0.64
GFR calc Af Amer: >60
GFR calc non Af Amer: >60
Glucose, Bld: 141 — ABNORMAL HIGH
Glucose, Bld: 151 — ABNORMAL HIGH
Potassium: 4.7
Sodium: 137
Sodium: 137

## 2010-11-25 LAB — URINALYSIS, ROUTINE W REFLEX MICROSCOPIC
Bilirubin Urine: NEGATIVE
Ketones, ur: NEGATIVE
Nitrite: NEGATIVE
Protein, ur: NEGATIVE
Specific Gravity, Urine: 1.012
Urobilinogen, UA: 0.2

## 2010-11-25 LAB — PROTIME-INR
INR: 0.9
Prothrombin Time: 12.7

## 2010-11-25 LAB — ABO/RH: ABO/RH(D): O POS

## 2010-11-25 LAB — CBC
HCT: 36
HCT: 26.2 — ABNORMAL LOW
HCT: 27.6 — ABNORMAL LOW
Hemoglobin: 12.2
Hemoglobin: 9.3 — ABNORMAL LOW
MCHC: 34
MCHC: 34.5
MCV: 87.5
MCV: 87.7
MCV: 88.4
Platelets: 244
Platelets: 263
RDW: 13.7
RDW: 13.4
WBC: 7.4
WBC: 9.3

## 2010-11-25 LAB — DIFFERENTIAL
Basophils Absolute: 0.1
Basophils Relative: 1
Eosinophils Relative: 1
Monocytes Absolute: 0.3
Monocytes Relative: 5

## 2010-11-25 LAB — APTT: aPTT: 27

## 2010-11-29 LAB — HM PAP SMEAR

## 2010-11-30 ENCOUNTER — Other Ambulatory Visit: Payer: Self-pay | Admitting: Obstetrics & Gynecology

## 2010-11-30 DIAGNOSIS — N6489 Other specified disorders of breast: Secondary | ICD-10-CM

## 2011-01-14 ENCOUNTER — Ambulatory Visit
Admission: RE | Admit: 2011-01-14 | Discharge: 2011-01-14 | Disposition: A | Discharge status: Home / Self Care | Payer: Medicare Other | Source: Ambulatory Visit | Attending: Obstetrics & Gynecology | Admitting: Obstetrics & Gynecology

## 2011-01-14 DIAGNOSIS — N6489 Other specified disorders of breast: Secondary | ICD-10-CM

## 2011-04-27 ENCOUNTER — Encounter: Payer: Self-pay | Admitting: Family Medicine

## 2011-04-27 ENCOUNTER — Encounter: Payer: Self-pay | Admitting: *Deleted

## 2011-04-27 ENCOUNTER — Ambulatory Visit (INDEPENDENT_AMBULATORY_CARE_PROVIDER_SITE_OTHER): Payer: Medicare Other | Admitting: Family Medicine

## 2011-04-27 DIAGNOSIS — E559 Vitamin D deficiency, unspecified: Secondary | ICD-10-CM

## 2011-04-27 DIAGNOSIS — J309 Allergic rhinitis, unspecified: Secondary | ICD-10-CM

## 2011-04-27 DIAGNOSIS — Z79899 Other long term (current) drug therapy: Secondary | ICD-10-CM

## 2011-04-27 DIAGNOSIS — R5381 Other malaise: Secondary | ICD-10-CM

## 2011-04-27 DIAGNOSIS — R109 Unspecified abdominal pain: Secondary | ICD-10-CM

## 2011-04-27 DIAGNOSIS — R5383 Other fatigue: Secondary | ICD-10-CM

## 2011-04-27 LAB — COMPREHENSIVE METABOLIC PANEL
ALT: 20 U/L (ref 0–35)
Alkaline Phosphatase: 67 U/L (ref 39–117)
CO2: 27 mEq/L (ref 19–32)
Creat: 0.68 mg/dL (ref 0.50–1.10)
Glucose, Bld: 92 mg/dL (ref 70–99)
Sodium: 140 mEq/L (ref 135–145)
Total Bilirubin: 0.3 mg/dL (ref 0.3–1.2)
Total Protein: 6 g/dL (ref 6.0–8.3)

## 2011-04-27 LAB — POCT URINALYSIS DIPSTICK
Bilirubin, UA: NEGATIVE
Nitrite, UA: NEGATIVE
Protein, UA: NEGATIVE
Urobilinogen, UA: NEGATIVE
pH, UA: 6

## 2011-04-27 LAB — TSH: TSH: 1.543 u[IU]/mL (ref 0.350–4.500)

## 2011-04-27 NOTE — Progress Notes (Signed)
Chief complaint: Urinary Tract Infection has taken 2 rounds of abx from urgent care and still has symptoms. Also complains of chest congestion x 3 weeks and just not feeling well  HPI: Went to [facility] urgent care about 5 weeks ago with complaint of L sided pelvic pain.  Had same pain with a UTI in October.  Denied any dysuria, urgency or frequency.  Bowels are normal--denies constipation, blood in stools or any bowel changes.  She has been doing a lot of yoga, and doing a lot of core work, but denies pain with movement.  Denies nausea, vomiting, fevers, flank pain.  Treated with Cipro on 2/6, treated for 5 days, but had it refilled.  Has been off antibiotics for over 2 weeks.  Has had chest congestion for about 3 weeks.  Started out as a cold, used xicam and zyrtec, it got much better, but then started complaining of sore throat, and chest congestion, and dry cough. Denies heartburn.  Denies sniffling or sneezing, just complaining of dry nose.  Coughs when she lies down (ie doing yoga).  She is continuing to take the zyrtec. Denies any shortness of breath.  Past Medical History  Diagnosis Date  . Osteopenia stopped fosamax ~2006  . History of insomnia   . DDD (degenerative disc disease), cervical dr Noel Gerold    s/p surgery, ongoing pain  . Anxiety     sees [facility]  . Depression     h/o hospitalization with ECT  . Vitamin d deficiency borderline(28)-2010    Past Surgical History  Procedure Date  . Neck surgery 02/2010([facility])1/03,2005([facility])  . Lasik     Dr. Delaney Meigs  . Nasal septoplasty w/ turbinoplasty 1986  . Breast reduction surgery 08/2009  . Knee arthroscopy     right  . Total hip arthroplasty age 26    left  . Hemorrhoid surgery 2006    History   Social History  . Marital Status: Married    Spouse Name: N/A    Number of Children: 3  . Years of Education: N/A   Occupational History  . Not on file.   Social History Main Topics  . Smoking status: Never Smoker   .  Smokeless tobacco: Never Used  . Alcohol Use: Yes     maybe one drink per night.  . Drug Use: No  . Sexually Active: Not on file   Other Topics Concern  . Not on file   Social History Narrative   Lives with husband. Has 3 kids, 4 grandchildren    Family History  Problem Relation Age of Onset  . Arthritis Mother   . Depression Mother   . Heart disease Father 87    MI  . Diabetes Neg Hx   . Cancer Neg Hx     Current outpatient prescriptions:Calcium Carbonate-Vitamin D (CALCIUM-VITAMIN D) 500-200 MG-UNIT per tablet, Take 1 tablet by mouth 2 (two) times daily with a meal.  , Disp: , Rfl: ;  cetirizine (ZYRTEC) 10 MG tablet, Take 10 mg by mouth as needed.  , Disp: , Rfl: ;  lamoTRIgine (LAMICTAL) 150 MG tablet, Take 150 mg by mouth at bedtime.  , Disp: , Rfl:  zolpidem (AMBIEN) 10 MG tablet, Take 10 mg by mouth at bedtime as needed. Pt takes 1/4 tablet (2.5mg ) , Disp: , Rfl:   Allergies  Allergen Reactions  . Cefdinir     REACTION: rash  . Nitrofurantoin (Macrodantin)    ROS: She is feeling fatigued.  Denies nausea, vomiting,  diarrhea, skin rash, chest pain, shortness of breath. Denies fevers, bowel changes, urinary complaints.  See HPI.  Denies vaginal discharge or bleeding  PHYSICAL EXAM: BP 140/84  Pulse 72  Temp(Src) 97.9 F (36.6 C) (Oral)  Ht 5' 1.5" (1.562 m)  Wt 129 lb (58.514 kg)  BMI 23.98 kg/m2 Well developed, well appearing female in no distress HEENT:  PERRL, EOMI, conjunctiva clear.  Nasal mucosa mildly edematous with clear mucus.  OP clear.  Sinuses nontender.  TM's and EAC's normal. Neck: no lymphadenopathy, thyromegaly or mass Heart: regular rate and rhythm without murmur Lungs: clear bilaterally Back: no CVA tenderness, no spinal tenderness Abdomen: soft, normal bowel sounds.  Mild tenderness in LLQ.  No rebound tenderness or guarding.  No mass.  No suprapubic tenderness Pelvic exam:normal external genitalia.  Uterus normal in size, nontender, no adnexal  masses or tenderness Skin: no rash Psych: normal mood, affect, hygiene and grooming  ASSESSMENT/PLAN: 1. Abdominal pain  POCT Urinalysis Dipstick, CBC with Differential, Urine culture   LLQ  2. Other malaise and fatigue  CBC with Differential, Vitamin D 25 hydroxy, Comprehensive metabolic panel, TSH  3. Unspecified vitamin D deficiency  Vitamin D 25 hydroxy  4. Encounter for long-term (current) use of other medications  Comprehensive metabolic panel  5. Allergic rhinitis, cause unspecified     LLQ pain is similar to previously when diagnosed with UTI.  No other urinary symptoms.  Nonspecific urine dip.  Await culture results.  If urine culture negative, and ongoing LLQ pain, may need to evaluate further.  Fatigue--check labs.  May also be related to congestion/allergies.  Supportive measures reviewed.  No evidence of bacterial infection

## 2011-04-27 NOTE — Patient Instructions (Signed)
We are sending your urine for culture.  If there is an infection, we will contact you to put you on antibiotics.  Your symptoms are NOT classic for a bladder infection.  If you are having ongoing Left sided abdominal pain, and if urine culture is normal, we may need to do further testing (ie. Scan)  Your fatigue may be coming from allergies.  Continue the zyrtec and sinus rinses.  Add mucinex to help with chest congestion and cough.  If your mucus/phlegm become discolored, that could be a sign of a sinus infection or bronchitis that might need antibiotics, but there is no evidence of that now.  If you don't hear from [place] with blood and urine results within the next few days, please check back with [place].  It should take 2-3 days for culture results, and a day or two for blood tests.

## 2011-04-28 LAB — CBC WITH DIFFERENTIAL/PLATELET
Basophils Absolute: 0 10*3/uL (ref 0.0–0.1)
Basophils Relative: 1 % (ref 0–1)
Eosinophils Absolute: 0.2 10*3/uL (ref 0.0–0.7)
MCH: 29.7 pg (ref 26.0–34.0)
MCHC: 32.5 g/dL (ref 30.0–36.0)
Monocytes Relative: 7 % (ref 3–12)
Neutro Abs: 3.2 10*3/uL (ref 1.7–7.7)
Neutrophils Relative %: 58 % (ref 43–77)
Platelets: 293 10*3/uL (ref 150–400)
RDW: 12.7 % (ref 11.5–15.5)

## 2011-04-28 LAB — VITAMIN D 25 HYDROXY (VIT D DEFICIENCY, FRACTURES): Vit D, 25-Hydroxy: 47 ng/mL (ref 30–89)

## 2011-05-02 LAB — URINE CULTURE

## 2011-10-24 ENCOUNTER — Ambulatory Visit: Payer: Medicare Other | Admitting: Family Medicine

## 2011-11-14 ENCOUNTER — Ambulatory Visit: Payer: Medicare Other | Admitting: Family Medicine

## 2011-11-18 ENCOUNTER — Telehealth: Payer: Self-pay | Admitting: Family Medicine

## 2011-11-18 NOTE — Telephone Encounter (Signed)
Needs OV for evaluation (never seen by [place] for neck issues)

## 2011-11-18 NOTE — Telephone Encounter (Signed)
CALLED PT. LM TO CALL BACK TO MAKE APPOINTMENT TO BE EVALUATED FOR POSSIBLE REFERRAL.

## 2011-11-24 ENCOUNTER — Telehealth: Payer: Self-pay | Admitting: Family Medicine

## 2011-11-24 NOTE — Telephone Encounter (Signed)
wants

## 2011-11-30 ENCOUNTER — Encounter: Payer: Self-pay | Admitting: Family Medicine

## 2011-11-30 ENCOUNTER — Ambulatory Visit (INDEPENDENT_AMBULATORY_CARE_PROVIDER_SITE_OTHER): Payer: Medicare Other | Admitting: Family Medicine

## 2011-11-30 VITALS — BP 120/70 | HR 68 | Temp 97.8°F | Ht 62.0 in | Wt 127.0 lb

## 2011-11-30 DIAGNOSIS — M5030 Other cervical disc degeneration, unspecified cervical region: Secondary | ICD-10-CM

## 2011-11-30 DIAGNOSIS — M4800 Spinal stenosis, site unspecified: Secondary | ICD-10-CM | POA: Insufficient documentation

## 2011-11-30 DIAGNOSIS — F329 Major depressive disorder, single episode, unspecified: Secondary | ICD-10-CM

## 2011-11-30 DIAGNOSIS — F32A Depression, unspecified: Secondary | ICD-10-CM | POA: Insufficient documentation

## 2011-11-30 DIAGNOSIS — M542 Cervicalgia: Secondary | ICD-10-CM

## 2011-11-30 DIAGNOSIS — F3289 Other specified depressive episodes: Secondary | ICD-10-CM | POA: Insufficient documentation

## 2011-11-30 NOTE — Progress Notes (Signed)
Chief Complaint  Patient presents with  . Back Pain    would like referral to [facility] for injections.   HPI: She has had 3 neck surgeries, last surgery was about 2 years ago.  She has been followed by Dr. Sharolyn Douglas, and last saw him 3 months ago. (had earlier surgery by Dr. Channing Mutters).  Apparently x-rays were done at her last visit, and she was told things were unchanged. Dr. Noel Gerold rx's the hydrocodone for her.  She underwent a course of PT through his office, which didn't seem to help much.  Massages give temporary benefit, had 1 yesterday.  Taking 1/2 tablet of hydrocodone only once daily, about 3-4 times/week since July.  Sometimes she'll take 3 Aleve, but it doesn't seem to help. She also tried Celebrex--made her feel weird.  She has had epidural steroids in the past without benefit.  She plays bridge with Dr. Chari Manning mother, and would like to see him for a consultation to see what else there is to offer her for pain relief.  She doesn't really like to take medications.  She is active in doing yoga--feels worse afterwards, sore in muscles, if upper body work is done.  Pain is in her L neck, starting at occiput, into her trapezius, and to her L shoulder/acromion.  Pain is described as aching, throbbing; last night the pain was burning.  Denies any radiation of pain, numbness, tingling, weakness (just generalized).    Last MRI in computer was from 11/10/2008 (which may have been prior to her last surgery): IMPRESSION:  1. Mild acute marrow edema and enhancement involving the T1-T2  endplates. Favor degenerative etiology due to disc degeneration  and protrusion at this level which causes some T1 neural foraminal  stenosis, but no definite spinal stenosis.  2. Borderline spinal stenosis at C5-C6 is stable to mildly  progressed since 2009. Unchanged chronic moderate to severe  bilateral C6 foraminal stenosis.   Daughter hasn't been supportive, telling her pain is all in her head, psychosomatic, which  has made her feel worse, crying more.  She is under the care of a psychiatrist through [facility].  Past Medical History  Diagnosis Date  . Osteopenia stopped fosamax ~2006  . History of insomnia   . DDD (degenerative disc disease), cervical dr Noel Gerold    s/p surgery, ongoing pain  . Anxiety     sees [facility]  . Depression     h/o hospitalization with ECT  . Vitamin d deficiency borderline(28)-2010   Past Surgical History  Procedure Date  . Neck surgery 02/2010([facility])1/03,2005([facility])  . Lasik     Dr. Delaney Meigs  . Nasal septoplasty w/ turbinoplasty 1986  . Breast reduction surgery 08/2009  . Knee arthroscopy     right  . Total hip arthroplasty age 17    left  . Hemorrhoid surgery 2006   History   Social History  . Marital Status: Married    Spouse Name: N/A    Number of Children: 3  . Years of Education: N/A   Occupational History  . Not on file.   Social History Main Topics  . Smoking status: Never Smoker   . Smokeless tobacco: Never Used  . Alcohol Use: Yes     maybe one drink per night.  . Drug Use: No  . Sexually Active: Not on file   Other Topics Concern  . Not on file   Social History Narrative   Lives with husband. Has 3 kids, 4 grandchildren  ROS: +sore throat this morning (has been ongoing, has seen Dr. Jenne Pane and been treated with ABX).  Denies cough, fever, shortness of breath.  + depression.  No nausea, vomiting, diarrhea or new rashes (just DSAP on her lower extremities, under care of Dr. Nicholas Lose)  PHYSICAL EXAM: BP 120/70  Pulse 68  Temp 97.8 F (36.6 C) (Oral)  Ht 5\' 2"  (1.575 m)  Wt 127 lb (57.607 kg)  BMI 23.23 kg/m2 Well developed, pleasant female in no distress HEENT:  PERRL, EOMI, conjunctiva clear.  OP: clear Neck: no lymphadenopathy.  Spine nontender.  Area of pain starts at L occiput, and extends down paraspinous muscle and trapezius.  No significant spasm noted. Normal strength, sensation, and DTR's. Skin: no rash Psych: mildly  depressed, full range of affect  ASSESSMENT/PLAN:  1. Neck pain on left side  Ambulatory referral to Pain Clinic  2. Spinal stenosis  Ambulatory referral to Pain Clinic  3. DDD (degenerative disc disease), cervical  Ambulatory referral to Pain Clinic  4. Depressive disorder, not elsewhere classified     Chronic neck pain--refer to Dr. Murray Hodgkins for evaluation.  May need more current MRI, but will let him assess.  I recommend discussing with psychiatrist the possibility of using Cymbalta to help with her pain, as well as her moods, which haven't been as good lately.

## 2011-11-30 NOTE — Patient Instructions (Addendum)
Results from MRI from 10/2008 IMPRESSION:  1. Mild acute marrow edema and enhancement involving the T1-T2  endplates. Favor degenerative etiology due to disc degeneration  and protrusion at this level which causes some T1 neural foraminal  stenosis, but no definite spinal stenosis.  2. Borderline spinal stenosis at C5-C6 is stable to mildly  progressed since 2009. Unchanged chronic moderate to severe  bilateral C6 foraminal stenosis.  3. Stable chronic severe left C7 foraminal stenosis.  4. C4-C5 ACDF with no adverse features identified.   I recommend discussing with your psychiatrist the possibility of using Cymbalta to help with your pain (it is also helpful in treating moods; depression/anxiety, which sounds like you've been having some more trouble with lately).  We are referring you to Dr. Murray Hodgkins, and you will be called with appointment.  Continue heat, stretches, massage.

## 2011-12-07 ENCOUNTER — Other Ambulatory Visit: Payer: Self-pay | Admitting: Obstetrics & Gynecology

## 2011-12-07 DIAGNOSIS — Z1231 Encounter for screening mammogram for malignant neoplasm of breast: Secondary | ICD-10-CM

## 2011-12-08 ENCOUNTER — Telehealth: Payer: Self-pay | Admitting: Family Medicine

## 2011-12-08 NOTE — Telephone Encounter (Signed)
PT STATES DR KNAPP ADVISED THE SHE SHOULD SEE PAIN DR, PSYCHIATRIST TO HELP HER EVALUATE IF PT SHOULD START TAKING CYMBALTA OR NOT. PT HAS DONE THAT AND SHE HAS BEEN DX'D WITH FIBROMYALGIA AND OK'D TO TAKE CYMBALTA BUT WAS TOLD THAT IT MAY NOT VERY GOOD TO INTERACT IT WITH HYDROCODONE THAT PT TAKES SOMETIMES. WHAT DOES DR KNAPP THINK ABOUT THIS AND PT STARTING CYMBALTA?

## 2011-12-08 NOTE — Telephone Encounter (Signed)
Tried to call patient back and her phone was not currently accepting calls, Time [name]? IDK. I will try her again on Monday.

## 2011-12-08 NOTE — Telephone Encounter (Signed)
She doesn't use significant hydrocodone (only 1/2 tablet, not even daily), so there should be no problem with her taking cymbalta.  Her pain might improve on the cymbalta, so that she needs it even less frequently.  Okay to take cymbalta (I had recommended that she discuss this medication with her other doctors).

## 2011-12-12 ENCOUNTER — Telehealth: Payer: Self-pay | Admitting: *Deleted

## 2011-12-12 NOTE — Telephone Encounter (Signed)
Left message with patient's husband for her to please return my call.

## 2011-12-13 ENCOUNTER — Telehealth: Payer: Self-pay | Admitting: Family Medicine

## 2011-12-13 NOTE — Telephone Encounter (Signed)
Pt returned veronica's call and message was relayed. Pt will call and speak to veronica concerning any questions or comments tomorrow.

## 2012-01-17 ENCOUNTER — Ambulatory Visit
Admission: RE | Admit: 2012-01-17 | Discharge: 2012-01-17 | Disposition: A | Discharge status: Home / Self Care | Payer: Medicare Other | Source: Ambulatory Visit | Attending: Obstetrics & Gynecology | Admitting: Obstetrics & Gynecology

## 2012-01-17 DIAGNOSIS — Z1231 Encounter for screening mammogram for malignant neoplasm of breast: Secondary | ICD-10-CM

## 2012-03-14 ENCOUNTER — Telehealth: Payer: Self-pay | Admitting: *Deleted

## 2012-03-14 NOTE — Telephone Encounter (Signed)
She can try seeing a chiropractor who can do manipulation and active release technique (ie Dr. Thereasa Distance at [facility], [facility]).  The ART is a soft tissue technique that might help her, plus can do manipulation, if indicated.  Probably similar to DO.  I don't know of any DO's to refer to specifically, but I think she can benefit from the ART and/or other treatments as recommended by him.  I truly do not have any other recommendations for her.  She can consider following up with pain clinic.

## 2012-03-14 NOTE — Telephone Encounter (Signed)
Patient called and stated that she is still having severe neck and trap pain. She saw pain specialist and he did rx Cymbalta for her as you suggested might help. The medication only made her stomach upset. She does not want to see a chiropractor. She read online that she should see an Pharmacist, community, do you have any suggestions since she is in severe pain and cannot find any relief. Thanks.

## 2012-03-14 NOTE — Telephone Encounter (Signed)
Tried to call patient back, left message on answering machine and I will try in the morning.

## 2012-03-15 ENCOUNTER — Telehealth: Payer: Self-pay | Admitting: *Deleted

## 2012-03-15 NOTE — Telephone Encounter (Signed)
Patient given Dr.Knapp's recommendation.

## 2012-03-15 NOTE — Telephone Encounter (Signed)
Called patient and left message again for her to return my call.

## 2012-09-03 ENCOUNTER — Other Ambulatory Visit: Payer: Self-pay | Admitting: Nurse Practitioner

## 2012-09-10 ENCOUNTER — Encounter: Payer: Self-pay | Admitting: Family Medicine

## 2012-09-10 ENCOUNTER — Ambulatory Visit (INDEPENDENT_AMBULATORY_CARE_PROVIDER_SITE_OTHER): Payer: Medicare Other | Admitting: Family Medicine

## 2012-09-10 VITALS — BP 130/80 | HR 72 | Temp 98.1°F | Ht 62.0 in | Wt 130.0 lb

## 2012-09-10 DIAGNOSIS — R5381 Other malaise: Secondary | ICD-10-CM

## 2012-09-10 DIAGNOSIS — E559 Vitamin D deficiency, unspecified: Secondary | ICD-10-CM

## 2012-09-10 DIAGNOSIS — R5383 Other fatigue: Secondary | ICD-10-CM

## 2012-09-10 LAB — CBC WITH DIFFERENTIAL/PLATELET
Basophils Absolute: 0 10*3/uL (ref 0.0–0.1)
Eosinophils Absolute: 0.1 10*3/uL (ref 0.0–0.7)
Eosinophils Relative: 2 % (ref 0–5)
Lymphocytes Relative: 32 % (ref 12–46)
MCV: 85.8 fL (ref 78.0–100.0)
Platelets: 329 10*3/uL (ref 150–400)
RDW: 13.9 % (ref 11.5–15.5)
WBC: 6.5 10*3/uL (ref 4.0–10.5)

## 2012-09-10 NOTE — Patient Instructions (Addendum)
Ensure that you are eating regularly, with frequent snacks.  Drink plenty of fluids--especially in this heat and with exercise  Try moist heat to the pain at your lower ribs, and use ibuprofen or aleve as needed to help with inflammation (for a week at a time, in case there is recurrent costochondritis)

## 2012-09-10 NOTE — Progress Notes (Signed)
Chief Complaint  Patient presents with  . Advice Only    has been feeling sluggish lately, since about March or April. Wonders if you could possibly do some bloodwork.   Feeling sluggish since March or April.  Notices is most after meals, mainly in the afternoon.  She is hungry after yoga, so eats, and then is exhausted after.  Avoids carbs, mainly has salad and tuna for lunch.  Feels wiped out about 30 minutes after eating.  She feels like she drinks a lot of water.  Doesn't seem to occur with other meals or other times of day, just after lunch.  Chronic pain in L shoulder due to neck issues.  Saw Thereasa Distance, but it wasn't helpful.  Seeing ortho at [facility], had MRI, and nothing more could be done other than putting rods in her neck, which he didn't want to do.  She has seen pain management in the past x 2 years, and also saw Dr. Murray Hodgkins, which wasn't helpful.  Takes 1/2 hydrocodone which seems to help, but she tries not to take it often.  Takes pain medication approx every 2 days.  Takes 1/3 of an [place] every night.  Pain doesn't keep her awake at night.  She tried Cymbalta in the past, couldn't tolerate due to nausea.  Complaining of feeling sluggish--not sure if it is from fighting the pain, getting older, or something else.  Denies that it is related to depression.  She is followed by psychiatry, and anxiety is well controlled.  She is asking for bloodwork to be repeated today.  Vitamin D deficiency--This is treated and monitored by Shirlyn Goltz, her GYN.  She checked it last in January, per pt.  She has been on weekly rx therapy for years, although intermittently will forget to take it.  She would like level checked again today.  Pain at RUQ intermittently x 3-4 years.  Hurting today.  Not sure if yoga sometimes flares it up. She recalls being told she had costochondritis in the past (by me).  Past Medical History  Diagnosis Date  . Osteopenia stopped fosamax ~2006  . History of insomnia    . DDD (degenerative disc disease), cervical dr Noel Gerold    s/p surgery, ongoing pain  . Anxiety     sees [facility]  . Depression     h/o hospitalization with ECT  . Vitamin D deficiency borderline(28)-2010    treated by her GYN   Past Surgical History  Procedure Laterality Date  . Neck surgery  02/2010([facility])1/03,2005([facility])  . Lasik      Dr. Delaney Meigs  . Nasal septoplasty w/ turbinoplasty  1986  . Breast reduction surgery  08/2009  . Knee arthroscopy      right  . Total hip arthroplasty  age 29    left  . Hemorrhoid surgery  2006   History   Social History  . Marital Status: Married    Spouse Name: N/A    Number of Children: 3  . Years of Education: N/A   Occupational History  . Not on file.   Social History Main Topics  . Smoking status: Never Smoker   . Smokeless tobacco: Never Used  . Alcohol Use: Yes     Comment: maybe one drink per night.  . Drug Use: No  . Sexually Active: Not on file   Other Topics Concern  . Not on file   Social History Narrative   Lives with husband. Has 3 kids, 4 grandchildren  Current Outpatient Prescriptions on File Prior to Visit  Medication Sig Dispense Refill  . Calcium Carbonate-Vitamin D (CALCIUM-VITAMIN D) 500-200 MG-UNIT per tablet Take 1 tablet by mouth 2 (two) times daily with a meal.        . lamoTRIgine (LAMICTAL) 150 MG tablet Take 150 mg by mouth at bedtime.        Marland Kitchen zolpidem (AMBIEN) 10 MG tablet Take 10 mg by mouth at bedtime as needed. Pt takes 1/4 tablet (2.5mg )       . cetirizine (ZYRTEC) 10 MG tablet Take 10 mg by mouth as needed.         No current facility-administered medications on file prior to visit.   Allergies  Allergen Reactions  . Cefdinir     REACTION: rash  . Nitrofurantoin (Macrodantin)    ROS:  Denies fevers, chills, nausea, vomiting, bowel changes, urinary complaints, exertional chest pain, shortness of breath, cough, bleeding, bruising, focal neuro symptoms, headaches, dizziness.  See  HPI.  PHYSICAL EXAM: BP 130/80  Pulse 72  Temp(Src) 98.1 F (36.7 C) (Oral)  Ht 5\' 2"  (1.575 m)  Wt 130 lb (58.968 kg)  BMI 23.77 kg/m2 Pleasant elderly female in no distress HEENT:  PERRL, conjunctiva clear.  Neck: no lymphadenopathy, thyromegaly or mass Heart: regular rate and rhythm without murmur Lungs: clear bilaterally Abdomen: soft, nontender, no organomegaly or mass.  tender at RLQ at costochondral margin and floating ribs Extremities: no edema Skin: no bleeding/bruising, lesions Psych: flat affect, appears somewhat tired.  Normal hygiene, grooming, speech Neuro: alert and oriented.  Normal gait.  Cranial nerves grossly intact.   ASSESSMENT/PLAN:  Other malaise and fatigue - Plan: Comprehensive metabolic panel, CBC with Differential, Vitamin D 25 hydroxy, TSH  Unspecified vitamin D deficiency - Plan: Vitamin D 25 hydroxy  Fatigue--? Etiology?  Check labs.  ?depression due to chronic pain?  Doubt reactive hypoglycemia given history.  Encouraged to drink plenty of fluids, avoid dehydration due to exercise, heat.  Will advise of results when available.  Mild costochondritis vs strain.  Recommended moist heat, NSAID's prn  F/u prn

## 2012-09-11 LAB — COMPREHENSIVE METABOLIC PANEL
ALT: 16 U/L (ref 0–35)
CO2: 27 mEq/L (ref 19–32)
Chloride: 103 mEq/L (ref 96–112)
Potassium: 3.8 mEq/L (ref 3.5–5.3)
Sodium: 137 mEq/L (ref 135–145)
Total Bilirubin: 0.3 mg/dL (ref 0.3–1.2)
Total Protein: 6.1 g/dL (ref 6.0–8.3)

## 2012-10-18 ENCOUNTER — Encounter (HOSPITAL_COMMUNITY): Payer: Self-pay | Admitting: Emergency Medicine

## 2012-10-18 ENCOUNTER — Emergency Department (HOSPITAL_COMMUNITY)
Admission: EM | Admit: 2012-10-18 | Discharge: 2012-10-18 | Disposition: A | Discharge status: Home / Self Care | Payer: BLUE CROSS/BLUE SHIELD | Source: Home / Self Care | Attending: Family Medicine | Admitting: Family Medicine

## 2012-10-18 DIAGNOSIS — H6122 Impacted cerumen, left ear: Secondary | ICD-10-CM

## 2012-10-18 DIAGNOSIS — H6120 Impacted cerumen, unspecified ear: Secondary | ICD-10-CM

## 2012-10-18 DIAGNOSIS — J40 Bronchitis, not specified as acute or chronic: Secondary | ICD-10-CM

## 2012-10-18 MED ORDER — PREDNISONE 20 MG PO TABS: ORAL_TABLET | ORAL | Status: DC

## 2012-10-18 MED ORDER — BENZONATATE 100 MG PO CAPS: 100.0000 mg | ORAL_CAPSULE | Freq: Three times a day (TID) | ORAL | Status: DC

## 2012-10-18 MED ORDER — AZITHROMYCIN 250 MG PO TABS: 250.0000 mg | ORAL_TABLET | Freq: Every day | ORAL | Status: DC

## 2012-10-18 MED ORDER — ALBUTEROL SULFATE HFA 108 (90 BASE) MCG/ACT IN AERS: 1.0000 | INHALATION_SPRAY | Freq: Four times a day (QID) | RESPIRATORY_TRACT | Status: DC | PRN

## 2012-10-18 NOTE — ED Notes (Signed)
C/o dry productive cough with white mucous.  Fatigue.  OTC medication used but no relief.   Sore throat comes and goes.  Patient states she does have drainage.  Patient states she has gargled with warm salt water.

## 2012-10-18 NOTE — ED Provider Notes (Signed)
CSN: [id number]     Arrival date & time 10/18/12  4098 History     First MD Initiated Contact with Patient 10/18/12 [phone]     Chief Complaint  Patient presents with  . Sore Throat  . Cough   (Consider location/radiation/quality/duration/timing/severity/associated sxs/prior Treatment) HPI Comments: 67 year old female nonsmoker with remote history of asthma. Here complaining of persistent cough with white sputum associated with nasal congestion postnasal drip and intermittent sore throat for about a week. States that she was exposed to her grandchild over a week ago who had an upper respiratory infection. Her appetite remains stable as well as energy level. Denies chest pain. Reports shortness of breath and mild wheezing only with coughing spells. No abdominal pain nausea vomiting or diarrhea. No rashes. No fever. No headache. Also reports left ear pressure. Patient concerned as her symptoms appear to be worsening and she [place] about [place]    Past Medical History  Diagnosis Date  . Osteopenia stopped fosamax ~2006  . History of insomnia   . DDD (degenerative disc disease), cervical dr Noel Gerold    s/p surgery, ongoing pain  . Anxiety     sees [facility]  . Depression     h/o hospitalization with ECT  . Vitamin D deficiency borderline(28)-2010    treated by her GYN   Past Surgical History  Procedure Laterality Date  . Neck surgery  02/2010([facility])1/03,2005([facility])  . Lasik      Dr. Delaney Meigs  . Nasal septoplasty w/ turbinoplasty  1986  . Breast reduction surgery  08/2009  . Knee arthroscopy      right  . Total hip arthroplasty  age 58    left  . Hemorrhoid surgery  2006   Family History  Problem Relation Age of Onset  . Arthritis Mother   . Depression Mother   . Pernicious anemia Mother   . Heart disease Father 7    MI  . Cancer Neg Hx   . Diabetes Paternal Uncle    History  Substance Use Topics  . Smoking status: Never Smoker   .  Smokeless tobacco: Never Used  . Alcohol Use: Yes     Comment: maybe one drink per night.   OB History   Grav Para Term Preterm Abortions TAB SAB Ect Mult Living                 Review of Systems  Constitutional: Positive for fatigue. Negative for fever, chills, diaphoresis, activity change and appetite change.  HENT: Positive for ear pain, congestion, rhinorrhea and postnasal drip. Negative for trouble swallowing and sinus pressure.   Respiratory: Positive for cough and wheezing. Negative for stridor.   Cardiovascular: Negative for chest pain and leg swelling.  Gastrointestinal: Negative for nausea, vomiting, abdominal pain and diarrhea.  Endocrine: Negative for polydipsia, polyphagia and polyuria.  Skin: Negative for rash.  Neurological: Negative for dizziness and headaches.    Allergies  Cefdinir and Nitrofurantoin  Home Medications   Current Outpatient Rx  Name  Route  Sig  Dispense  Refill  . albuterol (PROVENTIL HFA;VENTOLIN HFA) 108 (90 BASE) MCG/ACT inhaler   Inhalation   Inhale 1-2 puffs into the lungs every 6 (six) hours as needed for wheezing (or cough spels).   1 Inhaler   0   . azithromycin (ZITHROMAX) 250 MG tablet   Oral   Take 1 tablet (250 mg total) by mouth daily. Take first 2 tablets together, then 1  every day until finished.   6 tablet   0   . benzonatate (TESSALON) 100 MG capsule   Oral   Take 1 capsule (100 mg total) by mouth every 8 (eight) hours.   21 capsule   0   . Calcium Carbonate-Vitamin D (CALCIUM-VITAMIN D) 500-200 MG-UNIT per tablet   Oral   Take 1 tablet by mouth 2 (two) times daily with a meal.           . cetirizine (ZYRTEC) 10 MG tablet   Oral   Take 10 mg by mouth as needed.           . lamoTRIgine (LAMICTAL) 150 MG tablet   Oral   Take 150 mg by mouth at bedtime.           . predniSONE (DELTASONE) 20 MG tablet      2 tabs po daily for 5 days   10 tablet   0   . Vitamin D, Ergocalciferol, (DRISDOL) 50000 UNITS  CAPS   Oral   Take 50,000 Units by mouth every 7 (seven) days.         Marland Kitchen zolpidem (AMBIEN) 10 MG tablet   Oral   Take 10 mg by mouth at bedtime as needed. Pt takes 1/4 tablet (2.5mg )           BP 142/85  Pulse 65  Temp(Src) 97.1 F (36.2 C) (Oral)  Resp 18  SpO2 91% Physical Exam  Nursing note and vitals reviewed. Constitutional: She is oriented to person, place, and time. She appears well-developed and well-nourished. No distress.  HENT:  Head: Normocephalic and atraumatic.  Right Ear: External ear normal.  Mouth/Throat: No oropharyngeal exudate.  Nose: Nasal congestion with mild erythema and swelling of the turbinates.   Left external ear canal. There is a wax plug. No is swelling or erythema of the ear canal. Normal bilateral TMs confirmed after left ear irrigation.  Mild pharyngeal erythema without exudates. There is postnasal drip.   Eyes: Conjunctivae and EOM are normal. Pupils are equal, round, and reactive to light. Right eye exhibits no discharge. Left eye exhibits no discharge. No scleral icterus.  Neck: Neck supple. No JVD present. No thyromegaly present.  Cardiovascular: Normal rate, regular rhythm and normal heart sounds.  Exam reveals no gallop and no friction rub.   No murmur heard. Pulmonary/Chest: Effort normal and breath sounds normal. No respiratory distress. She has no wheezes. She has no rales. She exhibits no tenderness.  Bronchitic productive cough of white/yellow sputum. No hemoptysis  Abdominal: Soft. There is no tenderness.  Lymphadenopathy:    She has no cervical adenopathy.  Neurological: She is alert and oriented to person, place, and time.  Skin: No rash noted. She is not diaphoretic.    ED Course   Procedures (including critical care time)  Labs Reviewed - No data to display No results found. 1. Bronchitis     MDM  Clinically well. Impress reactive airways disease likely related to asthma. Left ear irrigated and  disimpacted. Treated with prednisone, albuterol inhaler, azithromycin and Tessalon Perles. Supportive care and red flags that should prompt her return to medical attention discussed with patient and provided in writing.  Sharin Grave, MD 10/20/12 [phone]

## 2012-10-19 MED ORDER — HYDROCODONE-ACETAMINOPHEN 7.5-325 MG/15ML PO SOLN: 10.0000 mL | Freq: Three times a day (TID) | ORAL | Status: DC | PRN

## 2012-10-19 NOTE — ED Notes (Signed)
Pt called requesting cough syrup with codeine ,conferred with dr. Alfonse Ras who was the provider that that saw ms. [name] 8/21. Dr Alfonse Ras agreed to write the rx but to advise pt that there would be no refills and also to use the tesselon pearls and albuterol for cough.Relayed same to pt.

## 2012-10-30 ENCOUNTER — Telehealth: Payer: Self-pay | Admitting: Internal Medicine

## 2012-10-30 DIAGNOSIS — M8580 Other specified disorders of bone density and structure, unspecified site: Secondary | ICD-10-CM

## 2012-10-30 NOTE — Telephone Encounter (Signed)
Pt wants to know if she needs a bone density test done. Pt is going out of town soon so you can leave a message on her phone and when she get back she will get it

## 2012-10-30 NOTE — Telephone Encounter (Signed)
It appears that she is likely due for a DEXA scan.  She hasn't had a CPE here (which is why this hadn't been discussed)--reviewing the computer,looks like last one was in 2008, but if she had since then (elsewhere) then maybe not.  Looks like she goes to [employer] for [employer], so if that is the last time she had DEXA (2008), it did show low bone mass, and she should have another one.  If that is the case, you can enter order for DEXA, dx [facility] for [facility].  And please schedule pt for CPE.  I will also want a record of her last colonoscopy--nothing was abstracted into computer, see if any records are in chart, vs have pt sign release

## 2012-10-30 NOTE — Telephone Encounter (Signed)
I have put order in the computer and call and left message for pt to call back

## 2012-12-31 ENCOUNTER — Other Ambulatory Visit: Payer: Self-pay

## 2012-12-31 DIAGNOSIS — Z1231 Encounter for screening mammogram for malignant neoplasm of breast: Secondary | ICD-10-CM

## 2012-12-31 DIAGNOSIS — Z9889 Other specified postprocedural states: Secondary | ICD-10-CM

## 2013-02-04 ENCOUNTER — Other Ambulatory Visit: Payer: BLUE CROSS/BLUE SHIELD

## 2013-02-04 ENCOUNTER — Ambulatory Visit: Payer: BLUE CROSS/BLUE SHIELD

## 2013-02-14 ENCOUNTER — Encounter (HOSPITAL_COMMUNITY): Payer: Self-pay | Admitting: Emergency Medicine

## 2013-02-14 ENCOUNTER — Emergency Department (HOSPITAL_COMMUNITY)
Admission: EM | Admit: 2013-02-14 | Discharge: 2013-02-14 | Disposition: A | Discharge status: Home / Self Care | Payer: BLUE CROSS/BLUE SHIELD | Source: Home / Self Care

## 2013-02-14 DIAGNOSIS — J069 Acute upper respiratory infection, unspecified: Secondary | ICD-10-CM

## 2013-02-14 DIAGNOSIS — J45909 Unspecified asthma, uncomplicated: Secondary | ICD-10-CM

## 2013-02-14 MED ORDER — METHYLPREDNISOLONE 4 MG PO KIT: PACK | ORAL | Status: DC

## 2013-02-14 MED ORDER — ALBUTEROL SULFATE (5 MG/ML) 0.5% IN NEBU
5.0000 mg | INHALATION_SOLUTION | Freq: Once | RESPIRATORY_TRACT | Status: AC
  Administered 2013-02-14: 5 mg via RESPIRATORY_TRACT

## 2013-02-14 MED ORDER — ALBUTEROL SULFATE HFA 108 (90 BASE) MCG/ACT IN AERS: 2.0000 | INHALATION_SPRAY | RESPIRATORY_TRACT | Status: DC | PRN

## 2013-02-14 MED ORDER — ALBUTEROL SULFATE (5 MG/ML) 0.5% IN NEBU
INHALATION_SOLUTION | RESPIRATORY_TRACT | Status: AC
  Filled 2013-02-14: qty 1

## 2013-02-14 MED ORDER — IPRATROPIUM BROMIDE 0.02 % IN SOLN
RESPIRATORY_TRACT | Status: AC
  Filled 2013-02-14: qty 2.5

## 2013-02-14 MED ORDER — SODIUM CHLORIDE 0.9 % IN NEBU
INHALATION_SOLUTION | RESPIRATORY_TRACT | Status: AC
  Filled 2013-02-14: qty 6

## 2013-02-14 MED ORDER — IPRATROPIUM BROMIDE 0.02 % IN SOLN
0.5000 mg | Freq: Once | RESPIRATORY_TRACT | Status: AC
  Administered 2013-02-14: 0.5 mg via RESPIRATORY_TRACT

## 2013-02-14 NOTE — ED Notes (Signed)
Pt c/o cold sxs onset Tuesday... sxs include: dry cough, HA, congestion, chest tightness Denies: SOB, wheezing, f/v/n/d.... Has tried OTC cold meds w/no relief She is alert w/no signs of acute distress.

## 2013-02-14 NOTE — ED Provider Notes (Signed)
He is  Results for orders placed in visit on 09/10/12  COMPREHENSIVE METABOLIC PANEL      Result Value Range   Sodium 137  135 - 145 mEq/L   Potassium 3.8  3.5 - 5.3 mEq/L   Chloride 103  96 - 112 mEq/L   CO2 27  19 - 32 mEq/L   Glucose, Bld 104 (*) 70 - 99 mg/dL   BUN 18  6 - 23 mg/dL   Creat 0.45  4.09 - 8.11 mg/dL   Total Bilirubin 0.3  0.3 - 1.2 mg/dL   Alkaline Phosphatase 64  39 - 117 U/L   AST 17  0 - 37 U/L   ALT 16  0 - 35 U/L   Total Protein 6.1  6.0 - 8.3 g/dL   Albumin 4.2  3.5 - 5.2 g/dL   Calcium 9.3  8.4 - 91.4 mg/dL  CBC WITH DIFFERENTIAL      Result Value Range   WBC 6.5  4.0 - 10.5 K/uL   RBC 4.24  3.87 - 5.11 MIL/uL   Hemoglobin 12.4  12.0 - 15.0 g/dL   HCT 78.2  95.6 - 21.3 %   MCV 85.8  78.0 - 100.0 fL   MCH 29.2  26.0 - 34.0 pg   MCHC 34.1  30.0 - 36.0 g/dL   RDW 08.6  57.8 - 46.9 %   Platelets 329  150 - 400 K/uL   Neutrophils Relative % 58  43 - 77 %   Neutro Abs 3.8  1.7 - 7.7 K/uL   Lymphocytes Relative 32  12 - 46 %   Lymphs Abs 2.1  0.7 - 4.0 K/uL   Monocytes Relative 7  3 - 12 %   Monocytes Absolute 0.4  0.1 - 1.0 K/uL   Eosinophils Relative 2  0 - 5 %   Eosinophils Absolute 0.1  0.0 - 0.7 K/uL   Basophils Relative 1  0 - 1 %   Basophils Absolute 0.0  0.0 - 0.1 K/uL   Smear Review Criteria for review not met    VITAMIN D 25 HYDROXY      Result Value Range   Vit D, 25-Hydroxy 45  30 - 89 ng/mL  TSH      Result Value Range   TSH 1.317  0.350 - 4.500 uIU/mL   CSN: [id number]     Arrival date & time 02/14/13  0802 History   First MD Initiated Contact with Patient 02/14/13 0818     Chief Complaint  Patient presents with  . URI   (Consider location/radiation/quality/duration/timing/severity/associated sxs/prior Treatment) HPI Comments: 67 y o F with anterior chest tightness, cough, ST, H/A, nasal congestion, runny nose. No fever or ear aches. Hx of asthma/RAD.  Patient is a 67 y.o. female presenting with URI.  URI Presenting  symptoms: congestion, cough, rhinorrhea and sore throat   Presenting symptoms: no fatigue and no fever   Associated symptoms: no neck pain     Past Medical History  Diagnosis Date  . Osteopenia stopped fosamax ~2006  . History of insomnia   . DDD (degenerative disc disease), cervical dr Noel Gerold    s/p surgery, ongoing pain  . Anxiety     sees [facility]  . Depression     h/o hospitalization with ECT  . Vitamin D deficiency borderline(28)-2010    treated by her GYN   Past Surgical History  Procedure Laterality Date  . Neck surgery  02/2010([facility])1/03,2005([facility])  . Lasik  Dr. Delaney Meigs  . Nasal septoplasty w/ turbinoplasty  1986  . Breast reduction surgery  08/2009  . Knee arthroscopy      right  . Total hip arthroplasty  age 34    left  . Hemorrhoid surgery  2006   Family History  Problem Relation Age of Onset  . Arthritis Mother   . Depression Mother   . Pernicious anemia Mother   . Heart disease Father 8    MI  . Cancer Neg Hx   . Diabetes Paternal Uncle    History  Substance Use Topics  . Smoking status: Never Smoker   . Smokeless tobacco: Never Used  . Alcohol Use: Yes     Comment: maybe one drink per night.   OB History   Grav Para Term Preterm Abortions TAB SAB Ect Mult Living                 Review of Systems  Constitutional: Negative for fever, chills, activity change, appetite change and fatigue.  HENT: Positive for congestion, postnasal drip, rhinorrhea and sore throat. Negative for facial swelling.   Eyes: Negative.   Respiratory: Positive for cough and chest tightness. Negative for shortness of breath.   Cardiovascular: Negative.   Gastrointestinal: Negative.   Genitourinary: Negative.   Musculoskeletal: Negative for neck pain and neck stiffness.  Skin: Negative for pallor and rash.  Neurological: Negative.     Allergies  Cefdinir and Nitrofurantoin  Home Medications   Current Outpatient Rx  Name  Route  Sig  Dispense  Refill  .  lamoTRIgine (LAMICTAL) 150 MG tablet   Oral   Take 150 mg by mouth at bedtime.           Marland Kitchen zolpidem (AMBIEN) 10 MG tablet   Oral   Take 10 mg by mouth at bedtime as needed. Pt takes 1/4 tablet (2.5mg )          . albuterol (PROVENTIL HFA;VENTOLIN HFA) 108 (90 BASE) MCG/ACT inhaler   Inhalation   Inhale 1-2 puffs into the lungs every 6 (six) hours as needed for wheezing (or cough spels).   1 Inhaler   0   . azithromycin (ZITHROMAX) 250 MG tablet   Oral   Take 1 tablet (250 mg total) by mouth daily. Take first 2 tablets together, then 1 every day until finished.   6 tablet   0   . benzonatate (TESSALON) 100 MG capsule   Oral   Take 1 capsule (100 mg total) by mouth every 8 (eight) hours.   21 capsule   0   . Calcium Carbonate-Vitamin D (CALCIUM-VITAMIN D) 500-200 MG-UNIT per tablet   Oral   Take 1 tablet by mouth 2 (two) times daily with a meal.           . cetirizine (ZYRTEC) 10 MG tablet   Oral   Take 10 mg by mouth as needed.           Marland Kitchen HYDROcodone-acetaminophen (HYCET) 7.5-325 mg/15 ml solution   Oral   Take 10 mLs by mouth every 8 (eight) hours as needed for cough.   120 mL   0   . methylPREDNISolone (MEDROL DOSEPAK) 4 MG tablet      follow package directions   21 tablet   0   . predniSONE (DELTASONE) 20 MG tablet      2 tabs po daily for 5 days   10 tablet   0   . Vitamin D, Ergocalciferol, (DRISDOL) 50000  UNITS CAPS   Oral   Take 50,000 Units by mouth every 7 (seven) days.          BP 121/76  Pulse 109  Temp(Src) 99 F (37.2 C) (Oral)  Resp 16 Physical Exam  Nursing note and vitals reviewed. Constitutional: She is oriented to person, place, and time. She appears well-developed and well-nourished. No distress.  HENT:  Mouth/Throat: No oropharyngeal exudate.  Bilateral TMs are normal Oropharynx with minor erythema without swelling or exudates. Positive for clear PND.  Eyes: Conjunctivae and EOM are normal.  Neck: Normal range of  motion. Neck supple.  Cardiovascular: Normal rate and regular rhythm.   Pulmonary/Chest: Effort normal and breath sounds normal. No respiratory distress.  Diminished breath sounds prickly with expiration. Prolonged expiratory phase and coarseness bilaterally with forced expiration and cough.  Musculoskeletal: Normal range of motion. She exhibits no edema.  Lymphadenopathy:    She has no cervical adenopathy.  Neurological: She is alert and oriented to person, place, and time.  Skin: Skin is warm and dry. No rash noted.  Psychiatric: She has a normal mood and affect.    ED Course  Procedures (including critical care time) Labs Review Labs Reviewed - No data to display Imaging Review No results found.    MDM   1. URI (upper respiratory infection)   2. RAD (reactive airway disease) with wheezing    Post duo neb patient states she is breathing better. Auscultation reveals diminished wheezing and coarseness, and increased air movement. URI and bronchospasm instructions. Use your albuterol HFA 2 puffs every 4 hours when necessary cough and wheeze Medrol Dosepak Process or cold plus and Robitussin-DM Drink plenty of fluids stay well hydrated   Hayden Rasmussen, NP 02/14/13 [phone]

## 2013-02-16 NOTE — ED Provider Notes (Signed)
Medical screening examination/treatment/procedure(s) were performed by a resident physician or non-physician practitioner and as the supervising physician I was immediately available for consultation/collaboration.  Katha Kuehne, MD    Latara Micheli S Devario Bucklew, MD 02/16/13 0854 

## 2013-02-25 ENCOUNTER — Encounter: Payer: Self-pay | Admitting: Family Medicine

## 2013-02-25 ENCOUNTER — Ambulatory Visit (INDEPENDENT_AMBULATORY_CARE_PROVIDER_SITE_OTHER): Payer: Medicare Other | Admitting: Family Medicine

## 2013-02-25 VITALS — BP 126/78 | HR 84 | Temp 97.9°F | Wt 132.0 lb

## 2013-02-25 DIAGNOSIS — R059 Cough, unspecified: Secondary | ICD-10-CM

## 2013-02-25 DIAGNOSIS — J45909 Unspecified asthma, uncomplicated: Secondary | ICD-10-CM

## 2013-02-25 DIAGNOSIS — R05 Cough: Secondary | ICD-10-CM

## 2013-02-25 MED ORDER — BENZONATATE 200 MG PO CAPS: 200.0000 mg | ORAL_CAPSULE | Freq: Three times a day (TID) | ORAL | Status: DC | PRN

## 2013-02-25 NOTE — Patient Instructions (Signed)
  Drink plenty of fluids. Take Mucinex as directed (get the 12 hour kind, and take it twice daily). Take tessalon perles as needed for cough  Use the albuterol inhaler as needed for wheezing.  Call or return if you start having fever, discolored mucus.

## 2013-02-25 NOTE — Progress Notes (Signed)
Chief Complaint  Patient presents with  . bronchitis    bronchitis, cough in chest, not has deep as last week but its just there started getting sick 2 days after the flu shot back in september and  it just been here since   She was seen in the urgent care 12/18 with URI symptoms.  She was wheezing, treated with DuoNeb and prescribed a Medrol Dosepak.  She was not given antibiotics.  She has been using albuterol MDI twice a day at most, with this illness, feels a little shaky from it.  She feels like she just hasn't been well since she got the flu shot in October.  Currently she is complaining of ongoing cough, productive of thick, clear mucus.  It is much better than prior to taking the steroids, but chest still feels heavy.  She remains somewhat fatigued.  She denies any fevers, nausea, vomiting.  The runny nose and head congestion has resolved, denies sinus pain.  She has been using Tussin at bedtime (she believes it has DM, and maybe guaifenesin).  Her cough at night isn't as bad as it was.  +sick contacts.  Past Medical History  Diagnosis Date  . Osteopenia stopped fosamax ~2006  . History of insomnia   . DDD (degenerative disc disease), cervical dr Noel Gerold    s/p surgery, ongoing pain  . Anxiety     sees [facility]  . Depression     h/o hospitalization with ECT  . Vitamin D deficiency borderline(28)-2010    treated by her GYN   Past Surgical History  Procedure Laterality Date  . Neck surgery  02/2010([facility])1/03,2005([facility])  . Lasik      Dr. Delaney Meigs  . Nasal septoplasty w/ turbinoplasty  1986  . Breast reduction surgery  08/2009  . Knee arthroscopy      right  . Total hip arthroplasty  age 87    left  . Hemorrhoid surgery  2006   History   Social History  . Marital Status: Married    Spouse Name: N/A    Number of Children: 3  . Years of Education: N/A   Occupational History  . Not on file.   Social History Main Topics  . Smoking status: Never Smoker   . Smokeless  tobacco: Never Used  . Alcohol Use: Yes     Comment: maybe one drink per night.  . Drug Use: No  . Sexual Activity: Not on file   Other Topics Concern  . Not on file   Social History Narrative   Lives with husband. Has 3 kids, 4 grandchildren    Current outpatient prescriptions:albuterol (PROVENTIL HFA;VENTOLIN HFA) 108 (90 BASE) MCG/ACT inhaler, Inhale 1-2 puffs into the lungs every 6 (six) hours as needed for wheezing (or cough spels)., Disp: 1 Inhaler, Rfl: 0;  lamoTRIgine (LAMICTAL) 150 MG tablet, Take 150 mg by mouth at bedtime.  , Disp: , Rfl: ;  Vitamin D, Ergocalciferol, (DRISDOL) 50000 UNITS CAPS, Take 50,000 Units by mouth every 7 (seven) days., Disp: , Rfl:  zolpidem (AMBIEN) 10 MG tablet, Take 10 mg by mouth at bedtime as needed. Pt takes 1/4 tablet (2.5mg ) , Disp: , Rfl: ;  albuterol (PROVENTIL HFA;VENTOLIN HFA) 108 (90 BASE) MCG/ACT inhaler, Inhale 2 puffs into the lungs every 4 (four) hours as needed for wheezing or shortness of breath., Disp: 1 Inhaler, Rfl: 0 Calcium Carbonate-Vitamin D (CALCIUM-VITAMIN D) 500-200 MG-UNIT per tablet, Take 1 tablet by mouth 2 (two) times daily with a meal.  ,  Disp: , Rfl: ;  cetirizine (ZYRTEC) 10 MG tablet, Take 10 mg by mouth as needed.  , Disp: , Rfl: ;  methylPREDNISolone (MEDROL DOSEPAK) 4 MG tablet, follow package directions, Disp: 21 tablet, Rfl: 0;  predniSONE (DELTASONE) 20 MG tablet, 2 tabs po daily for 5 days, Disp: 10 tablet, Rfl: 0  Allergies  Allergen Reactions  . Cefdinir     REACTION: rash  . Nitrofurantoin [Macrodantin]    ROS:  Denies fevers, chills, nausea, vomiting, diarrhea, bleeding/bruising, rashes, chest pain, dizziness, headache.  Chronic pain in neck and shoulder.  PHYSICAL EXAM: BP 126/78  Pulse 84  Temp(Src) 97.9 F (36.6 C) (Oral)  Wt 132 lb (59.875 kg) Well developed, well-appearing female in no distress. No significant coughing. Speaking easily HEENT:  PERRL, EOMI, conjunctiva clear. TM's and EAC's  normal. Nasal mucosa normal, without erythema or purulence.  Sinuses nontender.  OP clear Neck: no lymphadenopathy, thyromegaly or mass Heart: regular rate and rhythm Lungs: clear bilaterally, with good air movement. Some dry hacky cough occurred later during exam. Peak flow 350 prior to treatment, 350 after.  She felt slightly better.  Lungs remained clear  ASSESSMENT/PLAN:  ASTHMA  Cough - Plan: benzonatate (TESSALON) 200 MG capsule  No evidence of bacterial infection.  Asthma is stable, not actively flaring. Continue current medications.  Drink plenty of fluids. Take Mucinex as directed (get the 12 hour kind, and take it twice daily). Take tessalon perles as needed for cough  F/u prn persistent/worsening cough, fever, shortness of breath, or other concerns

## 2013-03-14 ENCOUNTER — Ambulatory Visit: Payer: Self-pay | Admitting: Nurse Practitioner

## 2013-04-09 ENCOUNTER — Encounter: Payer: Self-pay | Admitting: Nurse Practitioner

## 2013-04-10 ENCOUNTER — Ambulatory Visit (INDEPENDENT_AMBULATORY_CARE_PROVIDER_SITE_OTHER): Payer: Medicare Other | Admitting: Nurse Practitioner

## 2013-04-10 ENCOUNTER — Encounter: Payer: Self-pay | Admitting: Nurse Practitioner

## 2013-04-10 VITALS — BP 140/74 | HR 64 | Ht 62.25 in | Wt 132.0 lb

## 2013-04-10 DIAGNOSIS — R1011 Right upper quadrant pain: Secondary | ICD-10-CM

## 2013-04-10 DIAGNOSIS — M949 Disorder of cartilage, unspecified: Secondary | ICD-10-CM

## 2013-04-10 DIAGNOSIS — M8580 Other specified disorders of bone density and structure, unspecified site: Secondary | ICD-10-CM

## 2013-04-10 DIAGNOSIS — Z1211 Encounter for screening for malignant neoplasm of colon: Secondary | ICD-10-CM

## 2013-04-10 DIAGNOSIS — M899 Disorder of bone, unspecified: Secondary | ICD-10-CM

## 2013-04-10 DIAGNOSIS — Z01419 Encounter for gynecological examination (general) (routine) without abnormal findings: Secondary | ICD-10-CM

## 2013-04-10 NOTE — Progress Notes (Signed)
Patient ID: [name], female   DOB: 02/12/1946, 68 y.o.   MRN: [id number] 67 y.o. G3P3003 Married Caucasian Fe here for annual exam.  Chronic neck and shoulder pain and had Botox at [facility] in December without help.  She has a  6- 12 month pain in right rib cage when she exercises and bends over to the right side. The pain is described as a sharp 'catch' that will take her breath away and has to stop whatever she is doing.  More noticeable at the gym but it can occur at any time bending over.  No history of trauma and or any injury to the ribs in the past.  Symptoms are not associated with bloating, GERD, or nausea.    No LMP recorded. Patient is postmenopausal.          Sexually active: no  The current method of family planning is none.    Exercising: yes  Gym/ health club routine includes yoga and walking. Smoker:  no  Health Maintenance: Pap:  11/29/10, WNL MMG:  01/17/12, Bi-Rads 1:  Negative getting done 04/26/13 Colonoscopy:  2007, recall in 10 years BMD:  12/08, -1.1/-1.5, is scheduled for repeat 03/2013 TDaP:  05/2007 Labs:  PCP    reports that she has never smoked. She has never used smokeless tobacco. She reports that she drinks alcohol. She reports that she does not use illicit drugs.  Past Medical History  Diagnosis Date  . Osteopenia stopped fosamax ~2006  . History of insomnia   . DDD (degenerative disc disease), cervical dr Patrice Paradise    s/p surgery, ongoing pain  . Anxiety     sees [facility]  . Depression     h/o hospitalization with ECT  . Vitamin D deficiency borderline(28)-2010    treated by her GYN    Past Surgical History  Procedure Laterality Date  . Neck surgery  02/2010([facility])1/03,2005([facility])  . Lasik      Dr. Lucita Ferrara  . Nasal septoplasty w/ turbinoplasty  1986  . Breast reduction surgery  08/2009  . Knee arthroscopy      right  . Total hip arthroplasty Right 08/2007       . Hemorrhoid surgery  2006    Current Outpatient Prescriptions  Medication Sig  Dispense Refill  . albuterol (PROVENTIL HFA;VENTOLIN HFA) 108 (90 BASE) MCG/ACT inhaler Inhale 2 puffs into the lungs every 4 (four) hours as needed for wheezing or shortness of breath.  1 Inhaler  0  . Calcium Carbonate-Vitamin D (CALCIUM-VITAMIN D) 500-200 MG-UNIT per tablet Take 1 tablet by mouth 2 (two) times daily with a meal.        . lamoTRIgine (LAMICTAL) 150 MG tablet Take 150 mg by mouth at bedtime.        Marland Kitchen LYRICA 25 MG capsule Take 1 capsule by mouth 2 (two) times daily.      . Vitamin D, Ergocalciferol, (DRISDOL) 50000 UNITS CAPS Take 50,000 Units by mouth every 7 (seven) days.      Marland Kitchen zolpidem (AMBIEN) 10 MG tablet Take 10 mg by mouth at bedtime as needed. Pt takes 1/4 tablet (2.5mg )        No current facility-administered medications for this visit.    Family History  Problem Relation Age of Onset  . Arthritis Mother   . Depression Mother   . Pernicious anemia Mother   . Heart disease Father 83    MI  . Diabetes Paternal Uncle   . Breast cancer Cousin  paternal    ROS:  Pertinent items are noted in HPI.  Otherwise, a comprehensive ROS was negative.  Exam:   BP 140/74  Pulse 64  Ht 5' 2.25" (1.581 m)  Wt 132 lb (59.875 kg)  BMI 23.95 kg/m2 Height: 5' 2.25" (158.1 cm)  Ht Readings from Last 3 Encounters:  04/10/13 5' 2.25" (1.581 m)  09/10/12 5\' 2"  (1.575 m)  11/30/11 5\' 2"  (1.575 m)    General appearance: alert, cooperative and appears stated age Head: Normocephalic, without obvious abnormality, atraumatic Neck: no adenopathy, supple, symmetrical, trachea midline and thyroid normal to inspection and palpation Lungs: clear to auscultation bilaterally Breasts: normal appearance, no masses or tenderness, brast reduction scars bilateral Heart: regular rate and rhythm Abdomen: soft, non-tender; no masses,  no organomegaly. To put any pressure over the right rib area will initiate the same pain.  No mass is felt. No organomegaly. Extremities: extremities normal,  atraumatic, no cyanosis or edema Skin: Skin color, texture, turgor normal. No rashes or lesions Lymph nodes: Cervical, supraclavicular, and axillary nodes normal. No abnormal inguinal nodes palpated Neurologic: Grossly normal   Pelvic: External genitalia:  no lesions              Urethra:  normal appearing urethra with no masses, tenderness or lesions              Bartholin's and Skene's: normal                 Vagina: normal appearing vagina with normal color and discharge, no lesions              Cervix: anteverted              Pap taken: no Bimanual Exam:  Uterus:  normal size, contour, position, consistency, mobility, non-tender              Adnexa: no mass, fullness, tenderness               Rectovaginal: Confirms               Anus:  normal sphincter tone, no lesions  A:  Well Woman with normal exam  Postmenopausal no HRT  Pain right rib area ? Etiology    P:   Pap smear as per guidelines Not done  IFOB given to patient  Referral to Dr. Collene Mares pain RUQ  Chest X ray with view of ribs on right - written note given  Mammogram due now and will schedule  Counseled on breast self exam, mammography screening, adequate intake of calcium and vitamin D, diet and exercise return annually or prn  An After Visit Summary was printed and given to the patient.

## 2013-04-10 NOTE — Patient Instructions (Signed)

## 2013-04-12 NOTE — Progress Notes (Signed)
Encounter reviewed by Dr. Demaria Deeney Silva.  

## 2013-04-15 ENCOUNTER — Ambulatory Visit
Admission: RE | Admit: 2013-04-15 | Discharge: 2013-04-15 | Disposition: A | Discharge status: Home / Self Care | Payer: Medicare Other | Source: Ambulatory Visit | Attending: Nurse Practitioner | Admitting: Nurse Practitioner

## 2013-04-15 ENCOUNTER — Other Ambulatory Visit: Payer: Self-pay | Admitting: Nurse Practitioner

## 2013-04-15 DIAGNOSIS — R52 Pain, unspecified: Secondary | ICD-10-CM

## 2013-04-24 LAB — FECAL OCCULT BLOOD, IMMUNOCHEMICAL: IMMUNOLOGICAL FECAL OCCULT BLOOD TEST: NEGATIVE

## 2013-05-28 ENCOUNTER — Ambulatory Visit
Admission: RE | Admit: 2013-05-28 | Discharge: 2013-05-28 | Disposition: A | Discharge status: Home / Self Care | Payer: Medicare Other | Source: Ambulatory Visit

## 2013-05-28 ENCOUNTER — Ambulatory Visit
Admission: RE | Admit: 2013-05-28 | Discharge: 2013-05-28 | Disposition: A | Discharge status: Home / Self Care | Payer: Medicare Other | Source: Ambulatory Visit | Attending: Family Medicine | Admitting: Family Medicine

## 2013-05-28 DIAGNOSIS — Z9889 Other specified postprocedural states: Secondary | ICD-10-CM

## 2013-05-28 DIAGNOSIS — Z1231 Encounter for screening mammogram for malignant neoplasm of breast: Secondary | ICD-10-CM

## 2013-05-28 DIAGNOSIS — M8580 Other specified disorders of bone density and structure, unspecified site: Secondary | ICD-10-CM

## 2013-05-28 LAB — HM DEXA SCAN: HM DEXA SCAN: NORMAL

## 2013-06-03 ENCOUNTER — Encounter: Payer: Self-pay | Admitting: Internal Medicine

## 2013-08-08 ENCOUNTER — Ambulatory Visit (INDEPENDENT_AMBULATORY_CARE_PROVIDER_SITE_OTHER): Payer: Medicare Other | Admitting: Podiatry

## 2013-08-08 ENCOUNTER — Encounter: Payer: Self-pay | Admitting: Podiatry

## 2013-08-08 ENCOUNTER — Ambulatory Visit (INDEPENDENT_AMBULATORY_CARE_PROVIDER_SITE_OTHER): Payer: Medicare Other

## 2013-08-08 VITALS — Ht 62.25 in | Wt 128.0 lb

## 2013-08-08 DIAGNOSIS — D3610 Benign neoplasm of peripheral nerves and autonomic nervous system, unspecified: Secondary | ICD-10-CM

## 2013-08-08 DIAGNOSIS — D219 Benign neoplasm of connective and other soft tissue, unspecified: Secondary | ICD-10-CM

## 2013-08-08 DIAGNOSIS — G5760 Lesion of plantar nerve, unspecified lower limb: Secondary | ICD-10-CM

## 2013-08-08 NOTE — Progress Notes (Signed)
   Subjective:    Patient ID: [name], female    DOB: 1945/03/13, 68 y.o.   MRN: [id number]  HPI Comments: Pt states began having throbbing, stinging pain in the right 4th MPJ area plantar, and believes it may have started due to sandal wear     Review of Systems     Objective:   Physical Exam        Assessment & Plan:

## 2013-08-08 NOTE — Progress Notes (Signed)
Subjective:     Patient ID: [name], female   DOB: 1945/06/02, 68 y.o.   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

## 2013-08-21 ENCOUNTER — Ambulatory Visit: Payer: Medicare Other | Admitting: Podiatry

## 2013-08-22 ENCOUNTER — Ambulatory Visit: Payer: Medicare Other | Admitting: Podiatry

## 2013-08-26 ENCOUNTER — Other Ambulatory Visit: Payer: Self-pay

## 2013-08-26 NOTE — Telephone Encounter (Signed)
She saw Dr. [name] last July for the Vit D blood test - we also need a copy for [place] to continue with the RX. Other option is to have Dr. Tomi Bamberger give her the RX since wants labs done there.   She can have enough for 3 months until we get Vit D results or she is seen at PCP.

## 2013-08-26 NOTE — Telephone Encounter (Signed)
LMOM to contact office concerning BE

## 2013-08-26 NOTE — Telephone Encounter (Signed)
Last AEX: 02/15 Last refill: 09/10/12 Current AEX:02/16 Vitamin D lab done: 09/10/12  Please advise  Routing to Provider

## 2013-08-26 NOTE — Telephone Encounter (Signed)
Patient returning Shannon's call.

## 2013-08-27 NOTE — Telephone Encounter (Signed)
Returned pt call. Pt stated that she didn't need the rx. She saw Dr. Tomi Bamberger office and had a physical done in November 2014. I explain to pt that if she need the Rx that Dr. Tomi Bamberger office would be the one to give it to her since she had labs done in November. Pt voiced understanding and agreed.   Encounter closed

## 2013-08-28 ENCOUNTER — Ambulatory Visit: Payer: Medicare Other | Admitting: Podiatry

## 2013-08-28 ENCOUNTER — Telehealth: Payer: Self-pay | Admitting: Nurse Practitioner

## 2013-08-28 NOTE — Telephone Encounter (Signed)
Spoke with Clair Gulling at [facility] and advised that patient no longer needs rx (see previous telephone encounter regarding refill) and is aware for further refills needs to be filled with another provider who has drawn that patient's labs. Clair Gulling is agreeable and verbalizes understanding.  Denyse Dago, CMA at 08/27/2013 4:44 PM     Status: Signed        Returned pt call. Pt stated that she didn't need the rx. She saw Dr. Tomi Bamberger office and had a physical done in November 2014. I explain to pt that if she need the Rx that Dr. Tomi Bamberger office would be the one to give it to her since she had labs done in November. Pt voiced understanding and agreed.  Encounter closed   Routing to provider for final review. Patient agreeable to disposition. Will close encounter

## 2013-08-28 NOTE — Telephone Encounter (Signed)
Robin at [facility] is calling for status of refill request. Shirlean Mylar says they have sent a request several times with no response.

## 2013-08-29 ENCOUNTER — Other Ambulatory Visit: Payer: Self-pay | Admitting: *Deleted

## 2013-08-29 ENCOUNTER — Encounter: Payer: Self-pay | Admitting: Podiatry

## 2013-08-29 ENCOUNTER — Ambulatory Visit (INDEPENDENT_AMBULATORY_CARE_PROVIDER_SITE_OTHER): Payer: Medicare Other | Admitting: Podiatry

## 2013-08-29 VITALS — BP 122/86 | HR 69 | Resp 12

## 2013-08-29 DIAGNOSIS — D219 Benign neoplasm of connective and other soft tissue, unspecified: Secondary | ICD-10-CM

## 2013-08-29 DIAGNOSIS — D3610 Benign neoplasm of peripheral nerves and autonomic nervous system, unspecified: Secondary | ICD-10-CM

## 2013-08-29 MED ORDER — VITAMIN D (ERGOCALCIFEROL) 1.25 MG (50000 UNIT) PO CAPS: 50000.0000 [IU] | ORAL_CAPSULE | ORAL | Status: DC

## 2013-08-29 NOTE — Telephone Encounter (Signed)
Fax From: [facility] for Vitamin D 50,000 Last Refilled: AEX 03/03/12  Lat AEX: 04/10/13 with Ms. Patty no rx was sent/given Aex Scheduled: 04/14/14  Last Vit D Level checked: 09/10/12 at 92  Okay to refill please advise.

## 2013-08-31 NOTE — Progress Notes (Signed)
Subjective:     Patient ID: [name], female   DOB: 07/10/1945, 68 y.o.   MRN: [id number]  HPI patient states right foot is doing okay and that the neuroma is still they are and she can feel it but it is not hurting like it did before   Review of Systems     Objective:   Physical Exam Positive Biagio Borg sign with continued shooting pains between the third and fourth toes but far better than it was in the beginning    Assessment:     Neuroma symptomatology right still present    Plan:     Reviewed that this may ultimately take surgery but we will continue to work with it conservatively and today I did a sterile prep I then injected directly into the nerve root prior to breaking his digital branches with a purified D. hydrated alcohol solution and Marcaine

## 2013-12-30 ENCOUNTER — Encounter: Payer: Self-pay | Admitting: Podiatry

## 2014-04-14 ENCOUNTER — Ambulatory Visit: Payer: Medicare Other | Admitting: Nurse Practitioner

## 2014-04-15 ENCOUNTER — Ambulatory Visit (INDEPENDENT_AMBULATORY_CARE_PROVIDER_SITE_OTHER): Payer: Medicare Other | Admitting: Nurse Practitioner

## 2014-04-15 ENCOUNTER — Encounter: Payer: Self-pay | Admitting: Nurse Practitioner

## 2014-04-15 VITALS — BP 132/74 | HR 64 | Ht 62.0 in | Wt 132.0 lb

## 2014-04-15 DIAGNOSIS — Z01419 Encounter for gynecological examination (general) (routine) without abnormal findings: Secondary | ICD-10-CM

## 2014-04-15 DIAGNOSIS — Z0000 Encounter for general adult medical examination without abnormal findings: Secondary | ICD-10-CM

## 2014-04-15 DIAGNOSIS — E559 Vitamin D deficiency, unspecified: Secondary | ICD-10-CM

## 2014-04-15 NOTE — Progress Notes (Signed)
Patient ID: [name], female   DOB: 17-Nov-1945, 69 y.o.   MRN: [id number] 69 y.o. G3P3003 Married  Caucasian Fe here for annual exam. Left cervical neck pain since [place]. Had cervical epidural facet injection before Christmas.  No help. Has seen Ortho, massage therapy, chiropractor.  Still in pain and taking Advil daily. If severe 1/2 codeine.   Patient's last menstrual period was 08/15/1988 (exact date).          Sexually active: No.  The current method of family planning is none.    Exercising: Yes.    Yoga Smoker:  no  Health Maintenance: Pap:  11/29/10, negative  MMG:  05/28/13, Bi-Rads 1:  Negative  Colonoscopy:  2007, repeat in 10 years BMD:   05/28/13, T-Score: -0.2S/-1.8R TDaP:  08/29/2007 Shingles: 08/29/2007 Pneumonia: 08/29/2007 Prevnar 13: not yet Labs:  PCP   reports that she has never smoked. She has never used smokeless tobacco. She reports that she drinks alcohol. She reports that she does not use illicit drugs.  Past Medical History  Diagnosis Date  . Osteopenia stopped fosamax ~2006  . History of insomnia   . DDD (degenerative disc disease), cervical dr Patrice Paradise    s/p surgery, ongoing pain  . Anxiety     sees [facility]  . Depression     h/o hospitalization with ECT  . Vitamin D deficiency borderline(28)-2010    treated by her GYN    Past Surgical History  Procedure Laterality Date  . Neck surgery  02/2010([facility])1/03,2005([facility])  . Lasik      Dr. Lucita Ferrara  . Nasal septoplasty w/ turbinoplasty  1986  . Breast reduction surgery  08/2009  . Knee arthroscopy      right  . Total hip arthroplasty Right 08/2007       . Hemorrhoid surgery  2006    Current Outpatient Prescriptions  Medication Sig Dispense Refill  . Calcium Carbonate-Vitamin D (CALCIUM-VITAMIN D) 500-200 MG-UNIT per tablet Take 1 tablet by mouth 2 (two) times daily with a meal.      . lamoTRIgine (LAMICTAL) 150 MG tablet Take 150 mg by mouth at bedtime.      Marland Kitchen zolpidem (AMBIEN) 10 MG tablet Take 10 mg  by mouth at bedtime as needed. Pt takes 1/4 tablet (2.5mg )      No current facility-administered medications for this visit.    Family History  Problem Relation Age of Onset  . Arthritis Mother   . Depression Mother   . Pernicious anemia Mother   . Heart disease Father 41    MI  . Diabetes Paternal Uncle   . Breast cancer Cousin     paternal    ROS:  Pertinent items are noted in HPI.  Otherwise, a comprehensive ROS was negative.  Exam:   BP 132/74 mmHg  Pulse 64  Ht 5\' 2"  (1.575 m)  Wt 132 lb (59.875 kg)  BMI 24.14 kg/m2  LMP 08/15/1988 (Exact Date) Height: 5\' 2"  (157.5 cm) Ht Readings from Last 3 Encounters:  04/15/14 5\' 2"  (1.575 m)  08/08/13 5' 2.25" (1.581 m)  04/10/13 5' 2.25" (1.581 m)    General appearance: alert, cooperative and appears stated age Head: Normocephalic, without obvious abnormality, atraumatic Neck: no adenopathy, supple, symmetrical, trachea midline and thyroid normal to inspection and palpation Lungs: clear to auscultation bilaterally Breasts: normal appearance, no masses or tenderness Heart: regular rate and rhythm Abdomen: soft, non-tender; no masses,  no organomegaly Extremities: extremities normal, atraumatic, no cyanosis or edema Skin: Skin  color, texture, turgor normal. No rashes or lesions Lymph nodes: Cervical, supraclavicular, and axillary nodes normal. No abnormal inguinal nodes palpated Neurologic: Grossly normal   Pelvic: External genitalia:  no lesions              Urethra:  normal appearing urethra with no masses, tenderness or lesions              Bartholin's and Skene's: normal                 Vagina: normal appearing vagina with normal color and discharge, no lesions              Cervix: anteverted              Pap taken: Yes.   Bimanual Exam:  Uterus:  normal size, contour, position, consistency, mobility, non-tender              Adnexa: no mass, fullness, tenderness               Rectovaginal: Confirms                Anus:  normal sphincter tone, no lesions  Chaperone present:  yes  A:  Well Woman with normal exam  Postmenopausal no HRT Pain left cervical neck  Vit D deficient   P:   Reviewed health and wellness pertinent to exam  Pap smear taken today  Mammogram is due 04/2014  Follow with Vit D and pap  Counseled on breast self exam, mammography screening, adequate intake of calcium and vitamin D, diet and exercise return annually or prn  An After Visit Summary was printed and given to the patient.

## 2014-04-15 NOTE — Patient Instructions (Signed)

## 2014-04-16 LAB — VITAMIN D 25 HYDROXY (VIT D DEFICIENCY, FRACTURES): VIT D 25 HYDROXY: 28 ng/mL — AB (ref 30–100)

## 2014-04-17 LAB — IPS PAP SMEAR ONLY

## 2014-04-20 NOTE — Progress Notes (Signed)
Encounter reviewed by Dr. Brook Silva.  

## 2014-04-22 ENCOUNTER — Telehealth: Payer: Self-pay | Admitting: *Deleted

## 2014-04-22 NOTE — Telephone Encounter (Signed)
I have attempted to contact this patient by phone with the following results: left message to return call to Candee Hoon at [phone] answering machine (home per DPR).  No personal information given. [phone] (Home) *Preferred* 

## 2014-04-22 NOTE — Telephone Encounter (Signed)
-----   Message from Regina Eck, CNM sent at 04/16/2014  8:35 AM EST ----- Notify Vitamin D is low again needs protocol

## 2014-04-23 MED ORDER — VITAMIN D (ERGOCALCIFEROL) 1.25 MG (50000 UNIT) PO CAPS: 50000.0000 [IU] | ORAL_CAPSULE | ORAL | Status: DC

## 2014-04-23 NOTE — Telephone Encounter (Signed)
Pt notified in result note.  Closing encounter. 

## 2014-04-23 NOTE — Addendum Note (Signed)
Addended by: Graylon Good on: 04/23/2014 09:54 AM   Modules accepted: Orders, SmartSet

## 2014-05-01 ENCOUNTER — Other Ambulatory Visit: Payer: Self-pay

## 2014-05-01 DIAGNOSIS — Z1231 Encounter for screening mammogram for malignant neoplasm of breast: Secondary | ICD-10-CM

## 2014-05-27 ENCOUNTER — Telehealth: Payer: Self-pay

## 2014-05-27 NOTE — Telephone Encounter (Signed)
05/27/14 Disc Received from unknown location and filed on shelf.Britt Bottom

## 2014-06-02 ENCOUNTER — Ambulatory Visit: Payer: Self-pay

## 2014-06-09 ENCOUNTER — Ambulatory Visit
Admission: RE | Admit: 2014-06-09 | Discharge: 2014-06-09 | Disposition: A | Discharge status: Home / Self Care | Payer: Medicare Other | Source: Ambulatory Visit

## 2014-06-09 DIAGNOSIS — Z1231 Encounter for screening mammogram for malignant neoplasm of breast: Secondary | ICD-10-CM

## 2014-07-02 ENCOUNTER — Encounter: Payer: Self-pay | Admitting: Family Medicine

## 2014-07-02 ENCOUNTER — Ambulatory Visit (INDEPENDENT_AMBULATORY_CARE_PROVIDER_SITE_OTHER): Payer: Medicare Other | Admitting: Family Medicine

## 2014-07-02 VITALS — BP 160/90 | HR 60 | Ht 62.0 in | Wt 130.8 lb

## 2014-07-02 DIAGNOSIS — Z1211 Encounter for screening for malignant neoplasm of colon: Secondary | ICD-10-CM

## 2014-07-02 DIAGNOSIS — Z0000 Encounter for general adult medical examination without abnormal findings: Secondary | ICD-10-CM | POA: Diagnosis not present

## 2014-07-02 DIAGNOSIS — Z23 Encounter for immunization: Secondary | ICD-10-CM

## 2014-07-02 DIAGNOSIS — IMO0001 Reserved for inherently not codable concepts without codable children: Secondary | ICD-10-CM

## 2014-07-02 DIAGNOSIS — M8580 Other specified disorders of bone density and structure, unspecified site: Secondary | ICD-10-CM | POA: Diagnosis not present

## 2014-07-02 DIAGNOSIS — Z13220 Encounter for screening for lipoid disorders: Secondary | ICD-10-CM

## 2014-07-02 DIAGNOSIS — R030 Elevated blood-pressure reading, without diagnosis of hypertension: Secondary | ICD-10-CM

## 2014-07-02 DIAGNOSIS — R5383 Other fatigue: Secondary | ICD-10-CM

## 2014-07-02 LAB — POCT URINALYSIS DIPSTICK
BILIRUBIN UA: NEGATIVE
GLUCOSE UA: NEGATIVE
Ketones, UA: NEGATIVE
LEUKOCYTES UA: NEGATIVE
NITRITE UA: NEGATIVE
Protein, UA: NEGATIVE
Spec Grav, UA: 1.025
Urobilinogen, UA: NEGATIVE
pH, UA: 6

## 2014-07-02 NOTE — Patient Instructions (Addendum)
  HEALTH MAINTENANCE RECOMMENDATIONS:  It is recommended that you get at least 30 minutes of aerobic exercise at least 5 days/week (for weight loss, you may need as much as 60-90 minutes). This can be any activity that gets your heart rate up. This can be divided in 10-15 minute intervals if needed, but try and build up your endurance at least once a week.  Weight bearing exercise is also recommended twice weekly.  Eat a healthy diet with lots of vegetables, fruits and fiber.  "Colorful" foods have a lot of vitamins (ie green vegetables, tomatoes, red peppers, etc).  Limit sweet tea, regular sodas and alcoholic beverages, all of which has a lot of calories and sugar.  Up to 1 alcoholic drink daily may be beneficial for women (unless trying to lose weight, watch sugars).  Drink a lot of water.  Calcium recommendations are 1200-1500 mg daily (1500 mg for postmenopausal women or women without ovaries), and vitamin D 1000 IU daily.  This should be obtained from diet and/or supplements (vitamins), and calcium should not be taken all at once, but in divided doses.  Monthly self breast exams and yearly mammograms for women over the age of 81 is recommended.  Sunscreen of at least SPF 30 should be used on all sun-exposed parts of the skin when outside between the hours of 10 am and 4 pm (not just when at beach or pool, but even with exercise, golf, tennis, and yard work!)  Use a sunscreen that says "broad spectrum" so it covers both UVA and UVB rays, and make sure to reapply every 1-2 hours.  Remember to change the batteries in your smoke detectors when changing your clock times in the spring and fall.  Use your seat belt every time you are in a car, and please drive safely and not be distracted with cell phones and texting while driving.   Please take your vitamin D as directed (take the prescription weekly until you finish it, and then do whatever [name] says after your next test in a few  weeks.)  We are referring you to get another colonoscopy.  Your lungs sounded great--finish out the medication prescribed by Urgent Care.

## 2014-07-02 NOTE — Progress Notes (Signed)
Chief Complaint  Patient presents with  . Annual Exam    nonfasting annual exam-no pap, sees Tracey Harries, NP and is UTD. Eye exam is UTD with Dr.Sally Sabra Heck. Has not felt good since last [name]-picked up kettle ball and feels like he did something to her neck. Was told it was muscle strain by Dr.Cohen. Saw [facility] and had MRI, has 2 shots from [facility] and did not help. Currently taking prednisone and feeling somewhat better.    [name] is a 69 y.o. female who presents for a complete physical.  She has the following concerns:  She is complaining of pain--got worse since [name].  She developed a knot in her neck, on the left, after lifting a kettle ball last [name]. She has had massage, therapy, and it is finally improving some. She thinks the prednisone she is taking for her bronchitis is helping with her neck pain.  She is also complaining of fatigue--she finds that her fatigue is completely related to pain--when the pain flares, she loses energy.  She is not fasting today.  Vitamin D deficiency:  This was last checked by [name].  She is on weekly prescription, but admits to sometimes forgetting to take it. She last had it checked in February, but was not given a new prescription at that time. Never got the results. Chart reviewed--last vitamin D was 28, and pt was notified, and is scheduled for repeat testing later this month. She still has some prescription vitamin D left  Went to [facility] 4/28 with cough x 1 week (husband had been ill with bronchitis).  She was treated with Augmentin x 7 days, prednisone (40mg  x 5 days followed by 20mg  x 5 days) and Tussionex (156mL), diagnosed with bronchitis.  She is feeling better.    She continues to suffer from some chronic pain, mostly in her neck (but now also complaining in her hips).  The prednisone she was given for her bronchitis has been helping some with her pain.  She previously had two steroid injections that didn't help.  Osteopenia:   Last DEXA was 04/2013, and showed T-1.8 at R femur neck.  She has been treated (as stated above) for vitamin D deficiency, and is still undergoing treatment and monitoring by her GYN. Review of the chart shows that she previously took fosamax, back in 2006.  She stopped it on her own, due to fear of side effects, but has discussed with her GYN who is aware.    Immunization History  Administered Date(s) Administered  . Influenza Split 11/29/2011, 03/14/2014  . Influenza-Unspecified 12/29/2012  . Pneumococcal Conjugate-13 08/29/2007  . Tdap 08/29/2007  . Zoster 08/29/2007  she received pneumovax (not prevnar) in 2009; never had prevnar Last Pap smear: with GYN, within the year Last mammogram: 06/09/14 Last colonoscopy: approximately 2005, she believes it was at [name].   Last DEXA: 05/28/13 (T-1.8 at R femur neck) Dentist: twice yearly Ophtho: once yearly Exercise: yoga 2-3 times/week, and starting walking again since the weather got nicer.  Past Medical History  Diagnosis Date  . Osteopenia stopped fosamax ~2006  . History of insomnia   . DDD (degenerative disc disease), cervical dr [name]    s/p surgery, ongoing pain  . Anxiety     sees [facility], [place])  . Depression     h/o hospitalization with ECT  . Vitamin D deficiency borderline(28)-2010    treated by her GYN    Past Surgical History  Procedure Laterality  Date  . Neck surgery  02/2010(cohen)1/03,2005([facility])  . Lasik      Dr. Lucita Huerta  . Nasal septoplasty w/ turbinoplasty  1986  . Breast reduction surgery  08/2009  . Knee arthroscopy Right   . Total hip arthroplasty Left 08/2007       . Hemorrhoid surgery  2006    History   Social History  . Marital Status: Married    Spouse Name: [name]  . Number of Children: 3  . Years of Education: [name]   Occupational History  . Not on file.   Social History Main Topics  . Smoking status: Never Smoker   . Smokeless tobacco: Never Used  . Alcohol Use: 0.0 oz/week     0 Standard drinks or equivalent per week     Comment: has 1 glass of wine most nights (occasionally 2)  . Drug Use: No  . Sexual Activity: Not on file   Other Topics Concern  . Not on file   Social History Narrative   Lives with husband. Has 3 kids, 4 grandchildren. Daughter ([name]--drug rep, lives here in [name])   Other children [place] in [name]    Family History  Problem Relation Age of Onset  . Arthritis Mother   . Depression Mother   . Pernicious anemia Mother   . Heart disease Father 11    MI  . Diabetes Paternal Uncle   . Breast cancer Cousin     paternal  . Depression Brother     Current Outpatient Prescriptions on File Prior to Visit  Medication Sig Dispense Refill  . Calcium Carbonate-Vitamin D (CALCIUM-VITAMIN D) 500-200 MG-UNIT per tablet Take 1 tablet by mouth 2 (two) times daily with a meal.      . lamoTRIgine (LAMICTAL) 150 MG tablet Take 150 mg by mouth at bedtime.      . Vitamin D, Ergocalciferol, (DRISDOL) 50000 UNITS CAPS capsule Take 1 capsule (50,000 Units total) by mouth every 7 (seven) days. 30 capsule 1  . zolpidem (AMBIEN) 10 MG tablet Take 10 mg by mouth at bedtime as needed. Pt takes 1/4 tablet (2.5mg )      No current facility-administered medications on file prior to visit.    Allergies  Allergen Reactions  . Cefdinir     REACTION: rash  . Cymbalta [Duloxetine Hcl]     Abdominal pain  . Nitrofurantoin [Macrodantin]     ROS:  The patient denies anorexia, fever, weight changes, headaches,  vision changes, decreased hearing, ear pain, sore throat, breast concerns, chest pain, palpitations, dizziness, syncope, dyspnea on exertion, cough, swelling, nausea, vomiting, diarrhea, constipation, abdominal pain, melena, hematochezia, indigestion/heartburn, hematuria, incontinence, dysuria,  vaginal bleeding, discharge, odor or itch, genital lesions, numbness, tingling, weakness, tremor, suspicious skin lesions, depression, anxiety,  abnormal bleeding/bruising, or enlarged lymph nodes.  Hip pains (laterally, bilateral) and left neck/shoulder pain.   She has seen Dr. Alvan Dame for hip pain in the past, and had injection about 8 months ago for bursitis.  No problems with asthma since the age of 64    PHYSICAL EXAM:  BP 160/90 mmHg  Pulse 60  Ht 5\' 2"  (1.575 m)  Wt 130 lb 12.8 oz (59.33 kg)  BMI 23.92 kg/m2  LMP 08/15/1988 (Exact Date)  General Appearance:    Alert, cooperative, no distress, appears stated age  Head:    Normocephalic, without obvious abnormality, atraumatic  Eyes:    PERRL, conjunctiva/corneas clear, EOM's intact, fundi    benign  Ears:  Normal TM's and external ear canals  Nose:   Nares normal, mucosa normal, no drainage or sinus   tenderness  Throat:   Lips, mucosa, and tongue normal; teeth and gums normal  Neck:   Supple, no lymphadenopathy;  thyroid:  no   enlargement/tenderness/nodules; no carotid   bruit or JVD. Mildly tender at left trapezius at upper shoulder.  Back:    Spine nontender, no curvature, ROM normal, no CVA     tenderness  Lungs:     Clear to auscultation bilaterally without wheezes, rales or     ronchi; respirations unlabored  Chest Wall:    No tenderness or deformity   Heart:    Regular rate and rhythm, S1 and S2 normal, no murmur, rub   or gallop  Breast Exam:    Deferred to GYN  Abdomen:     Soft, non-tender, nondistended, normoactive bowel sounds,    no masses, no hepatosplenomegaly  Genitalia:    Deferred to GYN     Extremities:   No clubbing, cyanosis or edema. Mildly tender over left greater trochanter  Pulses:   2+ and symmetric all extremities  Skin:   Skin color, texture, turgor normal, no rashes or lesions  Lymph nodes:   Cervical, supraclavicular, and axillary nodes normal  Neurologic:   CNII-XII intact, normal strength, sensation and gait; reflexes 2+ and symmetric throughout          Psych:   Normal mood, affect, hygiene and  grooming.    ASSESSMENT/PLAN:  Annual physical exam - Plan: POCT Urinalysis Dipstick, CBC with Differential/Platelet, Lipid panel, TSH, Comprehensive metabolic panel  Colon cancer screening - Plan: Ambulatory referral to Gastroenterology  Other fatigue - Plan: CBC with Differential/Platelet, TSH, Comprehensive metabolic panel  Screening for lipid disorders - Plan: Lipid panel  Immunization due - Plan: Pneumococcal conjugate vaccine 13-valent  Elevated blood pressure - not high in the past.  possibly related to pain, medications. Periodically check elsewhere.  recheck when returns for fasting labs  Osteopenia - due for DEXA 04/2015.  Continue calcium, vitamin D, weight-bearing exercise. Important to adequately replace her vitamin D; re-check as scheduled   Discussed monthly self breast exams and yearly mammograms; at least 30 minutes of aerobic activity at least 5 days/week, weight-bearing exercise at least 2x/wk; proper sunscreen use reviewed; healthy diet, including goals of calcium and vitamin D intake and alcohol recommendations (less than or equal to 1 drink/day) reviewed; regular seatbelt use; changing batteries in smoke detectors.  Immunization recommendations discussed--see below.  Colonoscopy recommendations reviewed--due (past due).  Refer back to [facility] (she wants [place] to arrange)--if it wasn't [facility], it may have been Dr. Earlean Shawl. [id number] today--risks/side effects reviewed. Continue yearly flu shots  Return for fasting labs. Recheck BP at [place] Low sodium diet, check BP elsewhere

## 2014-07-03 ENCOUNTER — Other Ambulatory Visit: Payer: Self-pay | Admitting: *Deleted

## 2014-07-03 ENCOUNTER — Telehealth: Payer: Self-pay | Admitting: *Deleted

## 2014-07-03 DIAGNOSIS — Z1211 Encounter for screening for malignant neoplasm of colon: Secondary | ICD-10-CM

## 2014-07-03 NOTE — Telephone Encounter (Signed)
Called patient to ask if she could remember where she had colonoscopy done, she said she really cannot remember. I checked with [facility] and [facility] and she did not have at either place. She is willing to go anywhere you like. Where should I send her? [facility]?

## 2014-07-03 NOTE — Telephone Encounter (Signed)
Yes, [name] is fine

## 2014-07-03 NOTE — Telephone Encounter (Signed)
Referral entered for [facility].

## 2014-07-04 ENCOUNTER — Encounter: Payer: Self-pay | Admitting: Internal Medicine

## 2014-07-07 ENCOUNTER — Other Ambulatory Visit: Payer: Medicare Other

## 2014-07-07 DIAGNOSIS — Z0000 Encounter for general adult medical examination without abnormal findings: Secondary | ICD-10-CM

## 2014-07-07 DIAGNOSIS — Z13220 Encounter for screening for lipoid disorders: Secondary | ICD-10-CM

## 2014-07-07 DIAGNOSIS — R5383 Other fatigue: Secondary | ICD-10-CM

## 2014-07-07 LAB — CBC WITH DIFFERENTIAL/PLATELET
BASOS ABS: 0 10*3/uL (ref 0.0–0.1)
Basophils Relative: 0 % (ref 0–1)
Eosinophils Absolute: 0.2 10*3/uL (ref 0.0–0.7)
Eosinophils Relative: 3 % (ref 0–5)
HCT: 38.8 % (ref 36.0–46.0)
Hemoglobin: 12.8 g/dL (ref 12.0–15.0)
LYMPHS PCT: 29 % (ref 12–46)
Lymphs Abs: 2.1 10*3/uL (ref 0.7–4.0)
MCH: 29.5 pg (ref 26.0–34.0)
MCHC: 33 g/dL (ref 30.0–36.0)
MCV: 89.4 fL (ref 78.0–100.0)
MPV: 8.8 fL (ref 8.6–12.4)
Monocytes Absolute: 0.5 10*3/uL (ref 0.1–1.0)
Monocytes Relative: 7 % (ref 3–12)
NEUTROS PCT: 61 % (ref 43–77)
Neutro Abs: 4.4 10*3/uL (ref 1.7–7.7)
PLATELETS: 337 10*3/uL (ref 150–400)
RBC: 4.34 MIL/uL (ref 3.87–5.11)
RDW: 13.6 % (ref 11.5–15.5)
WBC: 7.2 10*3/uL (ref 4.0–10.5)

## 2014-07-08 LAB — COMPREHENSIVE METABOLIC PANEL
ALT: 13 U/L (ref 0–35)
AST: 11 U/L (ref 0–37)
Albumin: 3.7 g/dL (ref 3.5–5.2)
Alkaline Phosphatase: 62 U/L (ref 39–117)
BILIRUBIN TOTAL: 0.5 mg/dL (ref 0.2–1.2)
BUN: 18 mg/dL (ref 6–23)
CALCIUM: 8.7 mg/dL (ref 8.4–10.5)
CHLORIDE: 100 meq/L (ref 96–112)
CO2: 29 meq/L (ref 19–32)
CREATININE: 0.7 mg/dL (ref 0.50–1.10)
GLUCOSE: 96 mg/dL (ref 70–99)
POTASSIUM: 4.3 meq/L (ref 3.5–5.3)
SODIUM: 139 meq/L (ref 135–145)
TOTAL PROTEIN: 5.7 g/dL — AB (ref 6.0–8.3)

## 2014-07-08 LAB — LIPID PANEL
Cholesterol: 187 mg/dL (ref 0–200)
HDL: 72 mg/dL (ref >=46)
LDL CALC: 94 mg/dL (ref 0–99)
Total CHOL/HDL Ratio: 2.6 Ratio
Triglycerides: 105 mg/dL (ref <150)
VLDL: 21 mg/dL (ref 0–40)

## 2014-07-08 LAB — TSH: TSH: 1.686 u[IU]/mL (ref 0.350–4.500)

## 2014-07-10 ENCOUNTER — Other Ambulatory Visit: Payer: Self-pay | Admitting: *Deleted

## 2014-07-14 ENCOUNTER — Other Ambulatory Visit: Payer: Self-pay

## 2014-08-11 ENCOUNTER — Other Ambulatory Visit (INDEPENDENT_AMBULATORY_CARE_PROVIDER_SITE_OTHER): Payer: Medicare Other

## 2014-08-11 DIAGNOSIS — E559 Vitamin D deficiency, unspecified: Secondary | ICD-10-CM

## 2014-08-12 LAB — VITAMIN D 25 HYDROXY (VIT D DEFICIENCY, FRACTURES): Vit D, 25-Hydroxy: 40 ng/mL (ref 30–100)

## 2014-08-14 ENCOUNTER — Telehealth: Payer: Self-pay | Admitting: *Deleted

## 2014-08-14 NOTE — Telephone Encounter (Signed)
-----   Message from Kem Boroughs, [place] sent at 08/12/2014  8:05 AM EDT ----- Please let pt. Know that Vit d did improve from 28 - 40.  She needs 1000 IU of Vit D daily.

## 2014-08-14 NOTE — Telephone Encounter (Signed)
I have attempted to contact this patient by phone with the following results: left message to return call to [place] at [phone] answering machine (home per [facility]).  Pt name verified in voicemail.  Advised Vit D looks better, but requested return call to discuss new medication dosage.  [phone] (home)

## 2014-08-18 NOTE — Telephone Encounter (Signed)
I have attempted to contact this patient by phone with the following results: left message to return call to [place] at [phone] answering machine (home per [facility]).  Advised call was regarding recent labs.  [phone] (home)

## 2014-08-18 NOTE — Telephone Encounter (Signed)
Returned call to pt.  She is unable to come to phone.  Husband took message and will have patient call back in a few minutes.

## 2014-08-18 NOTE — Telephone Encounter (Signed)
Pt notified in result note.  Closing encounter. 

## 2014-08-18 NOTE — Telephone Encounter (Signed)
Returning call.

## 2014-08-29 DIAGNOSIS — K635 Polyp of colon: Secondary | ICD-10-CM

## 2014-08-29 HISTORY — DX: Polyp of colon: K63.5

## 2014-09-02 ENCOUNTER — Ambulatory Visit (AMBULATORY_SURGERY_CENTER): Payer: Self-pay

## 2014-09-02 VITALS — Ht 62.0 in | Wt 132.2 lb

## 2014-09-02 DIAGNOSIS — Z1211 Encounter for screening for malignant neoplasm of colon: Secondary | ICD-10-CM

## 2014-09-02 NOTE — Progress Notes (Signed)
Per pt, no allergies to soy or egg products.Pt not taking any weight loss meds or using  O2 at home.   Per pt,she had a colonoscopy done over 11 years ago, but does not remember the doctor's name. She states the colonoscopy was "normal".

## 2014-09-16 ENCOUNTER — Encounter: Payer: Self-pay | Admitting: Internal Medicine

## 2014-09-16 ENCOUNTER — Ambulatory Visit (AMBULATORY_SURGERY_CENTER): Payer: Medicare Other | Admitting: Internal Medicine

## 2014-09-16 VITALS — BP 156/79 | HR 52 | Temp 98.1°F | Resp 22 | Ht 62.0 in | Wt 132.0 lb

## 2014-09-16 DIAGNOSIS — Z1211 Encounter for screening for malignant neoplasm of colon: Secondary | ICD-10-CM

## 2014-09-16 DIAGNOSIS — K635 Polyp of colon: Secondary | ICD-10-CM

## 2014-09-16 DIAGNOSIS — D127 Benign neoplasm of rectosigmoid junction: Secondary | ICD-10-CM | POA: Diagnosis not present

## 2014-09-16 MED ORDER — SODIUM CHLORIDE 0.9 % IV SOLN: 500.0000 mL | INTRAVENOUS | Status: DC

## 2014-09-16 NOTE — Op Note (Signed)
[place]  [address] [place], [id number]   COLONOSCOPY PROCEDURE REPORT  PATIENT: [name], [name]  MR#: [id number] BIRTHDATE: May 19, 1945 , 69  yrs. old GENDER: female ENDOSCOPIST: Jerene Bears, MD REFERRED Terrall Laity, M.D. PROCEDURE DATE:  09/16/2014 PROCEDURE:   Colonoscopy, screening and Colonoscopy with snare polypectomy First Screening Colonoscopy - Avg.  risk and is 50 yrs.  old or older - No.  Prior Negative Screening - Now for repeat screening. 10 or more years since last screening  History of Adenoma - Now for follow-up colonoscopy & has been > or = to 3 yrs.  N/A  Polyps removed today? Yes ASA CLASS:   Class III INDICATIONS:Screening for colonic neoplasia and  average risk, pt reports normal colonoscopy 11 years ago at [facility]. MEDICATIONS: Monitored anesthesia care and Propofol 175 mg IV  DESCRIPTION OF PROCEDURE:   After the risks benefits and alternatives of the procedure were thoroughly explained, informed consent was obtained.  The digital rectal exam revealed no rectal mass.   The LB 1528  endoscope was introduced through the anus and advanced to the cecum, which was identified by both the appendix and ileocecal valve. No adverse events experienced.   The quality of the prep was good.  (MiraLax was used)  The instrument was then slowly withdrawn as the colon was fully examined. Estimated blood loss is zero unless otherwise noted in this procedure report.  COLON FINDINGS: A pedunculated polyp measuring 9 mm in size was found in the rectosigmoid colon.  A polypectomy was performed using snare cautery.  The resection was complete, the polyp tissue was completely retrieved and sent to histology.   A sessile polyp measuring 3 mm in size was found in the rectosigmoid colon.  A polypectomy was performed with a cold snare.  The resection was complete, the polyp tissue was completely retrieved and sent to histology.   There was mild diverticulosis  noted in the left colon. Retroflexed views revealed external hemorrhoids. The time to cecum = 2.5 Withdrawal time = 12.0   The scope was withdrawn and the procedure completed. COMPLICATIONS: There were no immediate complications.  ENDOSCOPIC IMPRESSION: 1.   Pedunculated polyp was found in the rectosigmoid colon; polypectomy was performed using snare cautery 2.   Sessile polyp was found in the rectosigmoid colon; polypectomy was performed with a cold snare 3.   Mild diverticulosis was noted in the left colon  RECOMMENDATIONS: 1.  Avoid all NSAIDs for the next 2 weeks. 2.  Await pathology results 3.  Timing of repeat colonoscopy will be determined by pathology findings. 4.  You will receive a letter within 1-2 weeks with the results of your biopsy as well as final recommendations.  Please call my office if you have not received a letter after 3 weeks.  eSigned:  Jerene Bears, MD 09/16/2014 12:03 PM cc: [name], MD and The Patient

## 2014-09-16 NOTE — Progress Notes (Signed)
Transferred to recovery room. A/O x3, pleased with MAC.  VSS.  Report to Shelia, RN. 

## 2014-09-16 NOTE — Progress Notes (Signed)
Called to room to assist during endoscopic procedure.  Patient ID and intended procedure confirmed with present staff. Received instructions for my participation in the procedure from the performing physician.  

## 2014-09-16 NOTE — Patient Instructions (Signed)
Discharge instructions given. Handouts on polyps and diverticulosis. Resume previous medications. Avoid all NSAIDS for 2 weeks. YOU HAD AN ENDOSCOPIC PROCEDURE TODAY AT [facility]:   Refer to the procedure report that was given to you for any specific questions about what was found during the examination.  If the procedure report does not answer your questions, please call your gastroenterologist to clarify.  If you requested that your care partner not be given the details of your procedure findings, then the procedure report has been included in a sealed envelope for you to review at your convenience later.  YOU SHOULD EXPECT: Some feelings of bloating in the abdomen. Passage of more gas than usual.  Walking can help get rid of the air that was put into your GI tract during the procedure and reduce the bloating. If you had a lower endoscopy (such as a colonoscopy or flexible sigmoidoscopy) you may notice spotting of blood in your stool or on the toilet paper. If you underwent a bowel prep for your procedure, you may not have a normal bowel movement for a few days.  Please Note:  You might notice some irritation and congestion in your nose or some drainage.  This is from the oxygen used during your procedure.  There is no need for concern and it should clear up in a day or so.  SYMPTOMS TO REPORT IMMEDIATELY:   Following lower endoscopy (colonoscopy or flexible sigmoidoscopy):  Excessive amounts of blood in the stool  Significant tenderness or worsening of abdominal pains  Swelling of the abdomen that is new, acute  Fever of 100F or higher   For urgent or emergent issues, a gastroenterologist can be reached at any hour by calling [phone].   DIET: Your first meal following the procedure should be a small meal and then it is ok to progress to your normal diet. Heavy or fried foods are harder to digest and may make you feel nauseous or bloated.  Likewise, meals heavy in  dairy and vegetables can increase bloating.  Drink plenty of fluids but you should avoid alcoholic beverages for 24 hours.  ACTIVITY:  You should plan to take it easy for the rest of today and you should NOT DRIVE or use heavy machinery until tomorrow (because of the sedation medicines used during the test).    FOLLOW UP: Our staff will call the number listed on your records the next business day following your procedure to check on you and address any questions or concerns that you may have regarding the information given to you following your procedure. If we do not reach you, we will leave a message.  However, if you are feeling well and you are not experiencing any problems, there is no need to return our call.  We will assume that you have returned to your regular daily activities without incident.  If any biopsies were taken you will be contacted by phone or by letter within the next 1-3 weeks.  Please call us at [phone] if you have not heard about the biopsies in 3 weeks.    SIGNATURES/CONFIDENTIALITY: You and/or your care partner have signed paperwork which will be entered into your electronic medical record.  These signatures attest to the fact that that the information above on your After Visit Summary has been reviewed and is understood.  Full responsibility of the confidentiality of this discharge information lies with you and/or your care-partner.

## 2014-09-17 ENCOUNTER — Telehealth: Payer: Self-pay | Admitting: *Deleted

## 2014-09-17 NOTE — Telephone Encounter (Signed)
  Follow up Call-  Call back number 09/16/2014  Post procedure Call Back phone  # [phone]  Permission to leave phone message Yes     Patient questions:  Do you have a fever, pain , or abdominal swelling? No. Pain Score  0 *  Have you tolerated food without any problems? Yes.    Have you been able to return to your normal activities? Yes.    Do you have any questions about your discharge instructions: Diet   No. Medications  No. Follow up visit  No.  Do you have questions or concerns about your Care? No.  Actions: * If pain score is 4 or above: No action needed, pain <4.  Patient stating she has had a runny nose since procedure. No fever. Referred patient to discharge instructions and that it should be better today. Call if she needs [place].

## 2014-09-23 ENCOUNTER — Encounter: Payer: Self-pay | Admitting: Family Medicine

## 2014-09-23 ENCOUNTER — Encounter: Payer: Self-pay | Admitting: Internal Medicine

## 2014-10-02 ENCOUNTER — Telehealth: Payer: Self-pay | Admitting: Internal Medicine

## 2014-10-02 NOTE — Telephone Encounter (Signed)
Left message for pt to call back  °

## 2014-10-03 NOTE — Telephone Encounter (Signed)
Left message for pt to call back  °

## 2014-10-07 NOTE — Telephone Encounter (Signed)
Left message for pt to call back. After multiple attempts have been unable to reach pt.

## 2015-03-17 DIAGNOSIS — H903 Sensorineural hearing loss, bilateral: Secondary | ICD-10-CM | POA: Diagnosis not present

## 2015-04-06 DIAGNOSIS — J029 Acute pharyngitis, unspecified: Secondary | ICD-10-CM | POA: Diagnosis not present

## 2015-04-13 ENCOUNTER — Telehealth: Payer: Self-pay | Admitting: *Deleted

## 2015-04-13 ENCOUNTER — Ambulatory Visit: Payer: Self-pay | Admitting: Family Medicine

## 2015-04-13 NOTE — Telephone Encounter (Signed)
This patient no showed for their appointment today.Which of the following is necessary for this patient.   A) No follow-up necessary   B) Follow-up urgent. Locate Patient Immediately.   C) Follow-up necessary. Contact patient and Schedule visit in ____ Days.   D) Follow-up Advised. Contact patient and Schedule visit in ____ Days. 

## 2015-04-14 DIAGNOSIS — K6289 Other specified diseases of anus and rectum: Secondary | ICD-10-CM | POA: Diagnosis not present

## 2015-04-15 NOTE — Telephone Encounter (Signed)
No follow up necessary.  

## 2015-04-17 ENCOUNTER — Encounter: Payer: Self-pay | Admitting: Family Medicine

## 2015-04-20 ENCOUNTER — Ambulatory Visit: Payer: Medicare Other | Admitting: Nurse Practitioner

## 2015-05-13 ENCOUNTER — Ambulatory Visit (INDEPENDENT_AMBULATORY_CARE_PROVIDER_SITE_OTHER): Payer: PPO | Admitting: Nurse Practitioner

## 2015-05-13 ENCOUNTER — Encounter: Payer: Self-pay | Admitting: Nurse Practitioner

## 2015-05-13 VITALS — BP 134/86 | HR 72 | Ht 61.75 in | Wt 128.0 lb

## 2015-05-13 DIAGNOSIS — Z0000 Encounter for general adult medical examination without abnormal findings: Secondary | ICD-10-CM | POA: Diagnosis not present

## 2015-05-13 DIAGNOSIS — E559 Vitamin D deficiency, unspecified: Secondary | ICD-10-CM | POA: Diagnosis not present

## 2015-05-13 DIAGNOSIS — Z01419 Encounter for gynecological examination (general) (routine) without abnormal findings: Secondary | ICD-10-CM

## 2015-05-13 DIAGNOSIS — R8290 Unspecified abnormal findings in urine: Secondary | ICD-10-CM

## 2015-05-13 NOTE — Patient Instructions (Addendum)

## 2015-05-13 NOTE — Progress Notes (Signed)
Patient ID: [name], female   DOB: 1945-04-13, 70 y.o.   MRN: [id number]  70 y.o. [id number] Married  Caucasian Fe here for annual exam.  Had hemorrhoid surgery in January.  Taking a probiotic with some help from constipation.  She is having occasional dysuria and urine odor - not sure if this is from the probiotic.    Patient's last menstrual period was 08/15/1988 (exact date).          Sexually active: No.  The current method of family planning is none.    Exercising: Yes.    yoga and walking Smoker:  no  Health Maintenance: Pap:04/15/14, Negative  MMG: 06/09/14, Bi-Rads 1: Negative  Colonoscopy:09/16/14, Tubular Adenoma x 1, hyperplastic polyp x 1, repeat in 5 years BMD: 05/28/13, T-Score: -0.2 Spine / -1.8 Right Femur Neck TDaP: 08/29/2007 Shingles: 08/29/2007 Pneumonia: 08/29/2007 Prevnar 13: 07/02/14 Hep C: done today Labs: PCP, we follow Vit D  Urine: Trace RBC, Trace Leuks   reports that she has never smoked. She has never used smokeless tobacco. She reports that she drinks about 4.2 oz of alcohol per week. She reports that she does not use illicit drugs.  Past Medical History  Diagnosis Date  . Osteopenia stopped fosamax ~2006  . History of insomnia   . DDD (degenerative disc disease), cervical dr Patrice Paradise    s/p surgery, ongoing pain  . Anxiety     sees [facility] Debbe B[facility], [place])  . Depression     h/o hospitalization with ECT  . Vitamin D deficiency borderline(28)-2010    treated by her GYN  . Post-operative nausea and vomiting   . Neck pain   . Colon polyps 08/2014    tubular adenoma and hyperplastic polyps (Dr. Hilarie Fredrickson)    Past Surgical History  Procedure Laterality Date  . Neck surgery  02/2010([facility])1/03,2005([facility])  . Lasik      Dr. Lucita Ferrara  . Nasal septoplasty w/ turbinoplasty  1986  . Breast reduction surgery  08/2009  . Knee arthroscopy Right   . Total hip arthroplasty Left 08/2007       . Hemorrhoid surgery  2006    Current Outpatient Prescriptions   Medication Sig Dispense Refill  . cholecalciferol (VITAMIN D) 1000 UNITS tablet Take 1,000 Units by mouth daily.    . Cyanocobalamin (B-12) 5000 MCG CAPS Take by mouth daily.    Marland Kitchen HYDROcodone-acetaminophen (NORCO) 10-325 MG per tablet Take 0.5 tablets by mouth as needed.     . lamoTRIgine (LAMICTAL) 150 MG tablet Take 150 mg by mouth at bedtime.      . Multiple Vitamin (MULTIVITAMIN) tablet Take 1 tablet by mouth daily.    Marland Kitchen VITAMIN K PO Take 100 mg by mouth daily.    Marland Kitchen zolpidem (AMBIEN) 10 MG tablet Take 2.5 mg by mouth at bedtime as needed.      No current facility-administered medications for this visit.    Family History  Problem Relation Age of Onset  . Arthritis Mother   . Depression Mother   . Pernicious anemia Mother   . Heart disease Father 42    MI  . Diabetes Paternal Uncle   . Breast cancer Cousin     paternal  . Depression Brother     ROS:  Pertinent items are noted in HPI.  Otherwise, a comprehensive ROS was negative.  Exam:   BP 134/86 mmHg  Pulse 72  Ht 5' 1.75" (1.568 m)  Wt 128 lb (58.06 kg)  BMI 23.61 kg/m2  LMP 08/15/1988 (Exact Date) Height: 5' 1.75" (156.8 cm) Ht Readings from Last 3 Encounters:  05/13/15 5' 1.75" (1.568 m)  09/16/14 5\' 2"  (1.575 m)  09/02/14 5\' 2"  (1.575 m)    General appearance: alert, cooperative and appears stated age Head: Normocephalic, without obvious abnormality, atraumatic Neck: no adenopathy, supple, symmetrical, trachea midline and thyroid normal to inspection and palpation Lungs: clear to auscultation bilaterally Breasts: normal appearance, no masses or tenderness Heart: regular rate and rhythm Abdomen: soft, non-tender; no masses,  no organomegaly Extremities: extremities normal, atraumatic, no cyanosis or edema Skin: Skin color, texture, turgor normal. No rashes or lesions Lymph nodes: Cervical, supraclavicular, and axillary nodes normal. No abnormal inguinal nodes palpated Neurologic: Grossly  normal   Pelvic: External genitalia:  no lesions              Urethra:  normal appearing urethra with no masses, tenderness or lesions              Bartholin's and Skene's: normal                 Vagina: normal appearing vagina with normal color and discharge, no lesions              Cervix: anteverted              Pap taken: No. Bimanual Exam:  Uterus:  normal size, contour, position, consistency, mobility, non-tender              Adnexa: no mass, fullness, tenderness               Rectovaginal: Confirms               Anus:  normal sphincter tone, no lesions  Chaperone present: no  A:  Well Woman with normal exam  Postmenopausal no HRT Chronic cervical neck pain Vit D deficient  S/P Hemorrhoid surgery  R/O UTI   P:   Reviewed health and wellness pertinent to exam  Pap smear as above  Mammogram is due 05/2015  Follow with labs and urine  Counseled on breast self exam, mammography screening, adequate intake of calcium and vitamin D, diet and exercise return annually or prn  An After Visit Summary was printed and given to the patient.

## 2015-05-14 LAB — HEPATITIS C ANTIBODY: HCV Ab: NEGATIVE

## 2015-05-14 LAB — VITAMIN D 25 HYDROXY (VIT D DEFICIENCY, FRACTURES): Vit D, 25-Hydroxy: 34 ng/mL (ref 30–100)

## 2015-05-14 NOTE — Progress Notes (Signed)
Encounter reviewed by Dr. Brook Amundson C. Silva.  

## 2015-05-15 ENCOUNTER — Other Ambulatory Visit: Payer: Self-pay | Admitting: Nurse Practitioner

## 2015-05-15 LAB — URINE CULTURE: Colony Count: >=100000

## 2015-05-15 MED ORDER — CIPROFLOXACIN HCL 500 MG PO TABS: 500.0000 mg | ORAL_TABLET | Freq: Two times a day (BID) | ORAL | Status: DC

## 2015-05-18 ENCOUNTER — Other Ambulatory Visit: Payer: Self-pay

## 2015-05-18 DIAGNOSIS — Z1231 Encounter for screening mammogram for malignant neoplasm of breast: Secondary | ICD-10-CM

## 2015-06-02 ENCOUNTER — Ambulatory Visit: Payer: PPO

## 2015-06-04 ENCOUNTER — Ambulatory Visit (INDEPENDENT_AMBULATORY_CARE_PROVIDER_SITE_OTHER): Payer: PPO

## 2015-06-04 VITALS — BP 150/82 | HR 66 | Ht 61.75 in | Wt 129.4 lb

## 2015-06-04 DIAGNOSIS — R319 Hematuria, unspecified: Secondary | ICD-10-CM | POA: Diagnosis not present

## 2015-06-04 DIAGNOSIS — N390 Urinary tract infection, site not specified: Secondary | ICD-10-CM | POA: Diagnosis not present

## 2015-06-04 NOTE — Progress Notes (Signed)
Patient came in for [facility] urine culture.  Patient states she is feeling much better.  Clean catch urine collected and sent for urine culture.

## 2015-06-05 LAB — URINE CULTURE: Colony Count: 70000

## 2015-06-30 ENCOUNTER — Ambulatory Visit: Payer: Medicare Other

## 2015-07-06 ENCOUNTER — Ambulatory Visit
Admission: RE | Admit: 2015-07-06 | Discharge: 2015-07-06 | Disposition: A | Discharge status: Home / Self Care | Payer: PPO | Source: Ambulatory Visit

## 2015-07-06 ENCOUNTER — Encounter: Payer: Self-pay | Admitting: Family Medicine

## 2015-07-06 ENCOUNTER — Ambulatory Visit (INDEPENDENT_AMBULATORY_CARE_PROVIDER_SITE_OTHER): Payer: PPO | Admitting: Family Medicine

## 2015-07-06 VITALS — BP 140/80 | HR 60 | Ht 62.0 in | Wt 128.2 lb

## 2015-07-06 DIAGNOSIS — R5383 Other fatigue: Secondary | ICD-10-CM | POA: Diagnosis not present

## 2015-07-06 DIAGNOSIS — M542 Cervicalgia: Secondary | ICD-10-CM

## 2015-07-06 DIAGNOSIS — Z1231 Encounter for screening mammogram for malignant neoplasm of breast: Secondary | ICD-10-CM

## 2015-07-06 DIAGNOSIS — Z5181 Encounter for therapeutic drug level monitoring: Secondary | ICD-10-CM | POA: Diagnosis not present

## 2015-07-06 DIAGNOSIS — E559 Vitamin D deficiency, unspecified: Secondary | ICD-10-CM | POA: Diagnosis not present

## 2015-07-06 DIAGNOSIS — Z23 Encounter for immunization: Secondary | ICD-10-CM

## 2015-07-06 DIAGNOSIS — G8929 Other chronic pain: Secondary | ICD-10-CM | POA: Diagnosis not present

## 2015-07-06 DIAGNOSIS — Z0000 Encounter for general adult medical examination without abnormal findings: Secondary | ICD-10-CM | POA: Diagnosis not present

## 2015-07-06 DIAGNOSIS — M8580 Other specified disorders of bone density and structure, unspecified site: Secondary | ICD-10-CM

## 2015-07-06 LAB — CBC WITH DIFFERENTIAL/PLATELET
BASOS ABS: 0 {cells}/uL (ref 0–200)
Basophils Relative: 0 %
EOS PCT: 1 %
Eosinophils Absolute: 56 cells/uL (ref 15–500)
HCT: 39.4 % (ref 35.0–45.0)
Hemoglobin: 13.1 g/dL (ref 11.7–15.5)
LYMPHS ABS: 2016 {cells}/uL (ref 850–3900)
LYMPHS PCT: 36 %
MCH: 29.8 pg (ref 27.0–33.0)
MCHC: 33.2 g/dL (ref 32.0–36.0)
MCV: 89.5 fL (ref 80.0–100.0)
MONOS PCT: 7 %
MPV: 9.3 fL (ref 7.5–12.5)
Monocytes Absolute: 392 cells/uL (ref 200–950)
NEUTROS PCT: 56 %
Neutro Abs: 3136 cells/uL (ref 1500–7800)
PLATELETS: 333 10*3/uL (ref 140–400)
RBC: 4.4 MIL/uL (ref 3.80–5.10)
RDW: 13.2 % (ref 11.0–15.0)
WBC: 5.6 10*3/uL (ref 4.0–10.5)

## 2015-07-06 LAB — POCT URINALYSIS DIPSTICK
BILIRUBIN UA: NEGATIVE
Glucose, UA: NEGATIVE
KETONES UA: NEGATIVE
Nitrite, UA: NEGATIVE
PH UA: 8
Spec Grav, UA: 1.01
Urobilinogen, UA: NEGATIVE

## 2015-07-06 LAB — COMPREHENSIVE METABOLIC PANEL
ALK PHOS: 64 U/L (ref 33–130)
ALT: 17 U/L (ref 6–29)
AST: 18 U/L (ref 10–35)
Albumin: 4.4 g/dL (ref 3.6–5.1)
BILIRUBIN TOTAL: 0.5 mg/dL (ref 0.2–1.2)
BUN: 23 mg/dL (ref 7–25)
CO2: 30 mmol/L (ref 20–31)
CREATININE: 0.67 mg/dL (ref 0.60–0.93)
Calcium: 9 mg/dL (ref 8.6–10.4)
Chloride: 100 mmol/L (ref 98–110)
GLUCOSE: 94 mg/dL (ref 65–99)
POTASSIUM: 4.5 mmol/L (ref 3.5–5.3)
Sodium: 136 mmol/L (ref 135–146)
TOTAL PROTEIN: 6.6 g/dL (ref 6.1–8.1)

## 2015-07-06 LAB — TSH: TSH: 1.61 mIU/L

## 2015-07-06 NOTE — Patient Instructions (Signed)
  HEALTH MAINTENANCE RECOMMENDATIONS:  It is recommended that you get at least 30 minutes of aerobic exercise at least 5 days/week (for weight loss, you may need as much as 60-90 minutes). This can be any activity that gets your heart rate up. This can be divided in 10-15 minute intervals if needed, but try and build up your endurance at least once a week.  Weight bearing exercise is also recommended twice weekly.  Eat a healthy diet with lots of vegetables, fruits and fiber.  "Colorful" foods have a lot of vitamins (ie green vegetables, tomatoes, red peppers, etc).  Limit sweet tea, regular sodas and alcoholic beverages, all of which has a lot of calories and sugar.  Up to 1 alcoholic drink daily may be beneficial for women (unless trying to lose weight, watch sugars).  Drink a lot of water.  Calcium recommendations are 1200-1500 mg daily (1500 mg for postmenopausal women or women without ovaries), and vitamin D 1000 IU daily.  This should be obtained from diet and/or supplements (vitamins), and calcium should not be taken all at once, but in divided doses.  Monthly self breast exams and yearly mammograms for women over the age of 60 is recommended.  Sunscreen of at least SPF 30 should be used on all sun-exposed parts of the skin when outside between the hours of 10 am and 4 pm (not just when at beach or pool, but even with exercise, golf, tennis, and yard work!)  Use a sunscreen that says "broad spectrum" so it covers both UVA and UVB rays, and make sure to reapply every 1-2 hours.  Remember to change the batteries in your smoke detectors when changing your clock times in the spring and fall.  Use your seat belt every time you are in a car, and please drive safely and not be distracted with cell phones and texting while driving.   I don't think a rheumatologist will add anything to your care of neck pain.  Since you aren't getting adequate pain relief from your current regimen, I recommend  following up with providers that have been trying to treat your pain (ie Dr. Nelva Bush, or with pain clinic), to consider other treatment options.  You can also discuss Cymbalta with your psychiatrist (not sure what the issue was when you tried it in the past--side effect).    [name] , Thank you for taking time to come for your Medicare Wellness Visit. I appreciate your ongoing commitment to your health goals. Please review the following plan we discussed and let me know if I can assist you in the future.   These are the goals we discussed: Goals    None      This is a list of the screening recommended for you and due dates:  Health Maintenance  Topic Date Due  . Mammogram  06/09/2015  . Pneumonia vaccines (2 of 2 - PPSV23) 07/02/2015  . Flu Shot  09/29/2015  . Tetanus Vaccine  08/28/2017  . Colon Cancer Screening  09/16/2019  . DEXA scan (bone density measurement)  Completed  . Shingles Vaccine  Completed  .  Hepatitis C: One time screening is recommended by Center for Disease Control  (CDC) for  adults born from [birth year] through 1967.   Completed   Your mammogram is scheduled today--please schedule your bone density test when you are there. Another bone density is recommended due to osteopenia (bone thinning).

## 2015-07-06 NOTE — Progress Notes (Signed)
Chief Complaint  Patient presents with  . Medicare Wellness    fasting AWV/CPE, no pap sees [name] and is UTD. Would like to know if she should have a referral to a rheumatologist sine [name] could not help her.     [name] is a 70 y.o. female who presents for annual wellness visit and f/u chronic problems. She saw GYN in March for her annual GYN exam. She had an E. Coli UTI at that time (noted odor to urine), treated with Cipro and had f/u urine culture last month which showed resolution of infection.  She currently denies any urinary symptoms, no abnormal odor.  Complaining of some right lateral hip pain for about 2 weeks.  Feels similar to bursitis that [name] treated with injection on the left side about 2 years ago.  She did a lot of walking a couple of weeks ago that may have triggered this.  Vitamin D deficiency--this is monitored by GYN [name]).  Level was 34 in March 2017.  Osteopenia: Last DEXA was 04/2013, and showed T-1.8 at R femur neck. She has been treated vitamin D deficiency, now resolved. Review of the chart shows that she previously took fosamax, back in 2006. She stopped it on her own, due to fear of side effects. She did not discuss bone density (and when next due) with her GYN.  Chronic neck pain--Currently is only seeing [name], to get her hydrocodone.  She uses 1/2 tablet once daily, but she complains of a lot of ongoing pain.  She has had injections by [name], also had different type of injection at [name] office.  Hasn't gotten much benefit from these treatments. She feels like the hydrocodone makes her a little shaky--head didn't shake as much when she stopped it for a short while.  She also recalls being on Lyrica for just a short time (was afraid of the side effects), and also may have taken Cymbalta in the past. Pain is worse when it rains, over the last 2 weeks.  sometimes takes '600mg'$  ibuprofen with some relief. She is asking about  rheumatologist, as she has seen many doctors and hasn't gotten the pain relief she is seeking.  Depression and insomnia--sees psychiatrist, doing well on current regimen.     Immunization History  Administered Date(s) Administered  . Influenza Split 11/29/2011, 12/15/2014  . Influenza-Unspecified 12/29/2012  . Pneumococcal Conjugate-13 07/02/2014  . Pneumococcal Polysaccharide-23 08/29/2007  . Tdap 08/29/2007  . Zoster 08/29/2007  pneumovax was given at [facility] prior to travel. Appears to have been <65 yo Last Pap smear: 04/15/14 Last mammogram: 06/09/14, scheduled for today Last colonoscopy: 09/16/14--tubular adenoma, repeat in 5 yrs Last DEXA: 3/15, T-1.8 R fem neck Dentist: twice yearly Ophtho: yearly Exercise:  yoga 2-3 times/week, and starting walking again since the weather got nicer, but not much. Hep C screen: negative, done by GYN 04/2015 Lipids:  Lab Results  Component Value Date   CHOL 187 07/07/2014   HDL 72 07/07/2014   LDLCALC 94 07/07/2014   TRIG 105 07/07/2014   CHOLHDL 2.6 07/07/2014    Other doctors caring for patient include: GYN: [name] Dentist: [name] Ophtho: [name] Dermatologist: has seen both [name] and [name] Podiatrist: [name] Ortho-spine: [name] Ortho--[name] (seen in the past for L hip bursitis) Neurosurgeon--[name] (no longer sees) Pain doctor--[name], also has seen [name] in the past (no longer sees either) Psych:  [name] (PA at [facility]) GI: [name]; also saw doctor at [facility] (?) for hemorrhoid treatment ENT: [name]   Depression screen: negative Fall screen: negative Functional status screen: notable only for hearing loss . See questionnaires in epic.   End of Life Discussion:  Patient does not have a living will or medical power of attorney  Past Medical History  Diagnosis Date  . Osteopenia stopped fosamax ~2006  . History of insomnia   . DDD  (degenerative disc disease), cervical dr Patrice Huerta    s/p surgery, ongoing pain  . Anxiety     sees [facility] [name], [place])  . Depression     h/o hospitalization with ECT  . Vitamin D deficiency borderline(28)-2010    treated by her GYN  . Post-operative nausea and vomiting   . Neck pain   . Colon polyps 08/2014    tubular adenoma and hyperplastic polyps ([name])    Past Surgical History  Procedure Laterality Date  . Neck surgery  02/2010([name])1/03,2005([name])  . Lasik      Dr. Lucita Huerta  . Nasal septoplasty w/ turbinoplasty  1986  . Breast reduction surgery  08/2009  . Knee arthroscopy Right   . Total hip arthroplasty Left 08/2007       . Hemorrhoid surgery  2006, 12/2014    Social History   Social History  . Marital Status: Married    Spouse Name: N/A  . Number of Children: 3  . Years of Education: N/A   Occupational History  . Not on file.   Social History Main Topics  . Smoking status: Never Smoker   . Smokeless tobacco: Never Used  . Alcohol Use: 4.2 oz/week    7 Glasses of wine per week     Comment: has 1 glass of wine most nights (occasionally 2)  . Drug Use: No  . Sexual Activity: Not on file   Other Topics Concern  . Not on file   Social History Narrative   Lives with husband. Has 3 kids, 4 grandchildren. Daughter ([name]--drug rep, lives here in [place])   Other children live in [place] and [place].    Family History  Problem Relation Age of Onset  . Arthritis Mother   . Depression Mother   . Pernicious anemia Mother   . Heart disease Father 29    MI  . Diabetes Paternal Uncle   . Breast cancer Cousin     paternal  . Depression Brother     Outpatient Encounter Prescriptions as of 07/06/2015  Medication Sig Note  . Calcium Carbonate-Vit D-Min (CALCIUM 1200 PO) Take 1,200 mg by mouth daily.   . cholecalciferol (VITAMIN D) 1000 UNITS tablet Take 1,000 Units by mouth daily.   . Cyanocobalamin (B-12) 5000 MCG CAPS Take by mouth daily.    Marland Kitchen HYDROcodone-acetaminophen (NORCO) 10-325 MG per tablet Take 0.5 tablets by mouth as needed.  07/06/2015: Takes 1/2 tablet once daily  . lamoTRIgine (LAMICTAL) 150 MG tablet Take 150 mg by mouth at bedtime.     . Multiple Vitamin (MULTIVITAMIN) tablet Take 1 tablet by mouth daily.   Marland Kitchen VITAMIN K PO Take 100 mg by mouth daily.   Marland Kitchen zolpidem (AMBIEN) 10 MG tablet Take 2.5 mg by mouth at bedtime as needed.  07/06/2015: Takes 1/4th tablet every night (from psych)  . cetirizine (ZYRTEC) 10 MG tablet Take 10 mg by mouth daily. Reported on 07/06/2015 07/06/2015: Uses prn, not lately  . [DISCONTINUED] ciprofloxacin (CIPRO) 500  MG tablet Take 1 tablet (500 mg total) by mouth 2 (two) times daily. 06/04/2015: Patient has completed Rx.   No facility-administered encounter medications on file as of 07/06/2015.    Allergies  Allergen Reactions  . Cefdinir     REACTION: rash  . Cymbalta [Duloxetine Hcl]     Abdominal pain  . Nitrofurantoin [Macrodantin]   . Sulfamethoxazole Hives    ROS: The patient denies anorexia, fever, weight changes, headaches, vision changes, ear pain, sore throat, breast concerns, chest pain, palpitations, dizziness, syncope, dyspnea on exertion, cough, swelling, nausea, vomiting, diarrhea, constipation, abdominal pain, melena, hematochezia, indigestion/heartburn, hematuria, incontinence, dysuria, vaginal bleeding, discharge, odor or itch, genital lesions, numbness, tingling, weakness, tremor, suspicious skin lesions, depression, anxiety, abnormal bleeding/bruising, or enlarged lymph nodes. Slight headache today due to fasting, no coffee yet. +hearing loss (needs new hearing aids) Occasional mild hand tremor when drinking coffee, sometimes her daughter notices some slight head bobbing/shaking. +right lateral hip pain, chronic neck pain.  Very slight tingling on the left posterior neck. No problems with asthma since the age of 88  PHYSICAL EXAM:  BP 140/80 mmHg  Pulse 60  Ht '5\' 2"'$   (1.575 m)  Wt 128 lb 3.2 oz (58.151 kg)  BMI 23.44 kg/m2  LMP 08/15/1988 (Exact Date)  5-6/10 pain currently.  General Appearance:   Alert, cooperative, no distress, appears stated age  Head:   Normocephalic, without obvious abnormality, atraumatic  Eyes:   PERRL, conjunctiva/corneas clear, EOM's intact, fundi   benign  Ears:   Normal TM's and external ear canals  Nose:  Nares normal, mucosa normal, no drainage or sinus tenderness  Throat:  Lips, mucosa, and tongue normal; teeth and gums normal  Neck:  Supple, no lymphadenopathy; thyroid: no enlargement/tenderness/nodules; no carotid  bruit or JVD. Tender at scar posteriorly, and at C6-7  Back:  lumbar and thoracic spine nontender, no curvature, ROM normal, no CVA tenderness  Lungs:   Clear to auscultation bilaterally without wheezes, rales or ronchi; respirations unlabored  Chest Wall:   No tenderness or deformity  Heart:   Regular rate and rhythm, S1 and S2 normal, no murmur, rub  or gallop  Breast Exam:   Deferred to GYN  Abdomen:   Soft, non-tender, nondistended, normoactive bowel sounds,   no masses, no hepatosplenomegaly  Genitalia:   Deferred to GYN     Extremities:  No clubbing, cyanosis or edema. Mildly tender over left greater trochanter  Pulses:  2+ and symmetric all extremities  Skin:  Skin color, texture, turgor normal, no rashes or lesions  Lymph nodes:  Cervical, supraclavicular, and axillary nodes normal  Neurologic:  CNII-XII intact, normal strength, sensation and gait; reflexes 2+, slightly decreased DTR in LUE.   Psych: Normal mood, affect, hygiene and grooming.         ASSESSMENT/PLAN:  Encounter for Medicare annual wellness exam - Plan: POCT Urinalysis Dipstick, Visual acuity screening  Osteopenia - discussed Ca, Vit D, weight bearing exercise. due for f/u DEXA - Plan: DG Bone  Density  Immunization due - Plan: Pneumococcal polysaccharide vaccine 23-valent greater than or equal to 2yo subcutaneous/IM  Vitamin D deficiency - continue daily supplement, adequately replaced per March labs at [place]: DG Bone Density  Medication monitoring encounter - Plan: Comprehensive metabolic panel, CBC with Differential/Platelet  Other fatigue - Plan: Comprehensive metabolic panel, CBC with Differential/Platelet, TSH  Annual physical exam - Plan: Comprehensive metabolic panel, CBC with Differential/Platelet, TSH  Chronic neck pain - suboptimal pain control,  only taking minimal meds. Discussed potential treatments, including meds. f/u with [name] or pain clinic. rheum not appropriate.    Discussed monthly self breast exams and yearly mammograms; at least 30 minutes of aerobic activity at least 5 days/week, weight-bearing exercise at least 2x/wk; proper sunscreen use reviewed; healthy diet, including goals of calcium and vitamin D intake and alcohol recommendations (less than or equal to 1 drink/day) reviewed; regular seatbelt use; changing batteries in smoke detectors. Immunization recommendations discussed--pneumovax today (appears that first was given prior to the age of 61); continue yearly flu shots . Colonoscopy recommendations reviewed--UTD, due again 08/2019.   DEXA--send copy of results to [name].  She is going for mammo at [facility] today and will schedule it while there.  CBC, c-met, TSH   I don't think a rheumatologist will add anything to your care of neck pain.  Since you aren't getting adequate pain relief from your current regimen, I recommend following up with providers that have been trying to treat your pain (ie [name], or with pain clinic), to consider other treatment options.  You can also discuss Cymbalta with your psychiatrist (not sure what the issue was when you tried it in the past--side effect).  F/u ortho for hip pain.  Given  paperwork for living will and healthcare power of attorney--discussed importance in filling this out.  Full code/care.   Medicare Attestation I have personally reviewed: The patient's medical and social history Their use of alcohol, tobacco or illicit drugs Their current medications and supplements The patient's functional ability including ADLs,fall risks, home safety risks, cognitive, and hearing and visual impairment Diet and physical activities Evidence for depression or mood disorders  The patient's weight, height, BMI, and visual acuity have been recorded in the chart.  I have made referrals, counseling, and provided education to the patient based on review of the above and I have provided the patient with a written personalized care plan for preventive services.     Elesa Garman A, MD   07/06/2015

## 2015-07-07 ENCOUNTER — Encounter: Payer: Self-pay | Admitting: Family Medicine

## 2015-07-07 NOTE — Addendum Note (Signed)
Addended byRita Ohara on: 07/07/2015 12:51 PM   Modules accepted: Level of Service

## 2015-07-07 NOTE — Progress Notes (Signed)
Yes, they do cover physicals

## 2015-07-15 DIAGNOSIS — M25551 Pain in right hip: Secondary | ICD-10-CM | POA: Diagnosis not present

## 2015-07-15 DIAGNOSIS — M7061 Trochanteric bursitis, right hip: Secondary | ICD-10-CM | POA: Diagnosis not present

## 2015-07-22 ENCOUNTER — Ambulatory Visit
Admission: RE | Admit: 2015-07-22 | Discharge: 2015-07-22 | Disposition: A | Discharge status: Home / Self Care | Payer: PPO | Source: Ambulatory Visit | Attending: Family Medicine | Admitting: Family Medicine

## 2015-07-22 DIAGNOSIS — M8589 Other specified disorders of bone density and structure, multiple sites: Secondary | ICD-10-CM | POA: Diagnosis not present

## 2015-07-22 DIAGNOSIS — Z780 Asymptomatic menopausal state: Secondary | ICD-10-CM | POA: Diagnosis not present

## 2015-07-22 DIAGNOSIS — E559 Vitamin D deficiency, unspecified: Secondary | ICD-10-CM

## 2015-07-22 DIAGNOSIS — M8580 Other specified disorders of bone density and structure, unspecified site: Secondary | ICD-10-CM

## 2015-08-19 DIAGNOSIS — R6884 Jaw pain: Secondary | ICD-10-CM | POA: Diagnosis not present

## 2015-08-19 DIAGNOSIS — H6121 Impacted cerumen, right ear: Secondary | ICD-10-CM | POA: Diagnosis not present

## 2015-08-19 DIAGNOSIS — H938X1 Other specified disorders of right ear: Secondary | ICD-10-CM | POA: Diagnosis not present

## 2015-08-19 DIAGNOSIS — H903 Sensorineural hearing loss, bilateral: Secondary | ICD-10-CM | POA: Diagnosis not present

## 2015-09-23 DIAGNOSIS — L2084 Intrinsic (allergic) eczema: Secondary | ICD-10-CM | POA: Diagnosis not present

## 2015-09-23 DIAGNOSIS — L814 Other melanin hyperpigmentation: Secondary | ICD-10-CM | POA: Diagnosis not present

## 2015-09-23 DIAGNOSIS — Z411 Encounter for cosmetic surgery: Secondary | ICD-10-CM | POA: Diagnosis not present

## 2015-09-23 DIAGNOSIS — L821 Other seborrheic keratosis: Secondary | ICD-10-CM | POA: Diagnosis not present

## 2015-09-23 DIAGNOSIS — L720 Epidermal cyst: Secondary | ICD-10-CM | POA: Diagnosis not present

## 2015-10-13 DIAGNOSIS — J029 Acute pharyngitis, unspecified: Secondary | ICD-10-CM | POA: Diagnosis not present

## 2015-10-26 ENCOUNTER — Encounter: Payer: Self-pay | Admitting: Family Medicine

## 2015-10-26 ENCOUNTER — Ambulatory Visit (INDEPENDENT_AMBULATORY_CARE_PROVIDER_SITE_OTHER): Payer: PPO | Admitting: Family Medicine

## 2015-10-26 VITALS — BP 148/84 | HR 64 | Temp 97.6°F | Ht 62.0 in | Wt 131.0 lb

## 2015-10-26 DIAGNOSIS — K219 Gastro-esophageal reflux disease without esophagitis: Secondary | ICD-10-CM

## 2015-10-26 DIAGNOSIS — J029 Acute pharyngitis, unspecified: Secondary | ICD-10-CM | POA: Diagnosis not present

## 2015-10-26 NOTE — Progress Notes (Signed)
Chief Complaint  Patient presents with  . Sore Throat    started Aug 6th, went to UC and they her lidocaine. It's of and on. She has been using tylenol and gargling. Taking zyrtec. Not helping. 8/17 went to daughter's house and went to [place] and had fried seafood-got knot on her left side that was painful and made her nauseous. Went away but now she has a lot of gas(burping) and indigestion when she eats. Wonders if she is having some reflux? Lots of drainage as well this morning.    8/7 started with a slight sore throat.  It would come and go, worse in the mornings. Zyrtec didn't help. She went to urgent care on 8/11 for sore throat.  She was told to gargle and take aspirin.  She happened to have lidocaine at home.  Gargles with this helps temporarily.  8/17 she went out for seafood, and that night she developed a knot in her LLQ. This pain lasted x3-4 days and then resolved.  She had indigestion, vomiting.  Denies diarrhea.  She has been burping a lot, having a lot of gas and indigestion after eating.  She has taken Gas-X and Mylanta.  She got some relief from the Mylanta. She took Investment banker, operational for sore throat last night. She also took some advil, along with her zyrtec.  PMH, PSH, SH reviewed  Outpatient Encounter Prescriptions as of 10/26/2015  Medication Sig Note  . Calcium Carbonate-Vit D-Min (CALCIUM 1200 PO) Take 1,200 mg by mouth daily.   . cetirizine (ZYRTEC) 10 MG tablet Take 10 mg by mouth daily. Reported on 07/06/2015   . cholecalciferol (VITAMIN D) 1000 UNITS tablet Take 1,000 Units by mouth daily.   . Cyanocobalamin (B-12) 5000 MCG CAPS Take by mouth daily.   Marland Kitchen HYDROcodone-acetaminophen (NORCO) 10-325 MG per tablet Take 0.5 tablets by mouth as needed.  10/26/2015: Takes 1/2 tablet once a day or every other day, for neck pain  . lamoTRIgine (LAMICTAL) 150 MG tablet Take 150 mg by mouth at bedtime.     . Multiple Vitamin (MULTIVITAMIN) tablet Take 1 tablet by mouth daily.    . Probiotic Product (PROBIOTIC ADVANCED PO) Take 1 capsule by mouth daily.   Marland Kitchen zolpidem (AMBIEN) 10 MG tablet Take 2.5 mg by mouth at bedtime as needed.  07/06/2015: Takes 1/4th tablet every night (from psych)  . [DISCONTINUED] VITAMIN K PO Take 100 mg by mouth daily.    No facility-administered encounter medications on file as of 10/26/2015.    Allergies  Allergen Reactions  . Cefdinir     REACTION: rash  . Cymbalta [Duloxetine Hcl]     Abdominal pain  . Nitrofurantoin [Macrodantin]   . Sulfamethoxazole Hives    ROS: No fever, chills.  No vomiting since 8/20.  Denies diarrhea or constipation. No further abdominal pain. Denies urinary complaints, vaginal bleeding, discharge.  Some fatigue.  Some slight hoarseness and had some mucus in her throat this morning.  Denies runny nose, sniffling, sneezing, sinus pain, headaches, dizziness.  Denies chest pain, shortness of breath, cough.  PHYSICAL EXAM: BP (!) 148/84 (BP Location: Left Arm, Patient Position: Sitting, Cuff Size: Normal)   Pulse 64   Temp 97.6 F (36.4 C) (Tympanic)   Ht 5\' 2"  (1.575 m)   Wt 131 lb (59.4 kg)   LMP 08/15/1988 (Exact Date)   BMI 23.96 kg/m   Well developed, pleasant female in no distress HEENT: PERRL EOMI, conjunctiva and sclera are clear. TM's and EAC's  normal. Nasal mucosa is pale, no drainage.  OP is clear--no erythema, lesions or other abnormality. Sinuses nontender Neck: no lymphadenopathy, thyromegaly o mass Heart: regular rate and rhythm without murmur Abdomen: very mild epigatric tenderness. No organomegaly or mass, no rebound or guarding Extremities: no edema Skin: normal turgor, no rash Psych: normal mood, affect, hygiene and grooming Neuro: alert and oriented, cranial nerves intact, normal gait  ASSESSMENT/PLAN:   Sore throat - normal exam--likely related to combination of reflux and postnasal drainage  Gastroesophageal reflux disease, esophagitis presence not specified - reviewed proper  diet and recommend Prilosec OTC or 2-4 weeks   Declines flu shot--wants to wait until later in Sept/October.   Continue to take the zyrtec daily in case some of the sore throat is related to postnasal drainage. Reflux is also likely a contributing factor. Read the information provided--cut back on tomatoes, spicy foods, etc. Take Prilosec OTC 20mg  once daily.  Best to be taken in the morning prior to breakfast.  Switch to taking before dinner if your night-time symptoms are worse than daytime symptoms. If helping some, but not enough, you can take the prilosec twice daily for up to 2-4 weeks. I don't intend for you to have to take the prilosec long-term, since you haven't really had problems with a lot of reflux in the past. Hopefully some dietary modifications, and treating any inflammation related to a recent virus should get things under control.

## 2015-10-26 NOTE — Patient Instructions (Addendum)
Your blood pressure was a little elevated today.  This could be from any sinus medication/decongestant in the OTC medications you have been taking.  Eating a diet higher in salt/sodium (ie the salt on the tomatoes!) can also raise blood pressure. Try and follow a low sodium diet, and periodically check your blood pressure when you are feeling well.  Ideally we would like to have your blood pressure run <135/85.  Continue to take the zyrtec daily in case some of the sore throat is related to postnasal drainage. Reflux is also likely a contributing factor. Read the information provided--cut back on tomatoes, spicy foods, etc. Take Prilosec OTC 20mg  once daily.  Best to be taken in the morning prior to breakfast.  Switch to taking before dinner if your night-time symptoms are worse than daytime symptoms. If helping some, but not enough, you can take the prilosec twice daily for up to 2-4 weeks. I don't intend for you to have to take the prilosec long-term, since you haven't really had problems with a lot of reflux in the past. Hopefully some dietary modifications, and treating any inflammation related to a recent virus should get things under control.  Food Choices for Gastroesophageal Reflux Disease, Adult When you have gastroesophageal reflux disease (GERD), the foods you eat and your eating habits are very important. Choosing the right foods can help ease the discomfort of GERD. WHAT GENERAL GUIDELINES DO I NEED TO FOLLOW?  Choose fruits, vegetables, whole grains, low-fat dairy products, and low-fat meat, fish, and poultry.  Limit fats such as oils, salad dressings, butter, nuts, and avocado.  Keep a food diary to identify foods that cause symptoms.  Avoid foods that cause reflux. These may be different for different people.  Eat frequent small meals instead of three large meals each day.  Eat your meals slowly, in a relaxed setting.  Limit fried foods.  Cook foods using methods other  than frying.  Avoid drinking alcohol.  Avoid drinking large amounts of liquids with your meals.  Avoid bending over or lying down until 2-3 hours after eating. WHAT FOODS ARE NOT RECOMMENDED? The following are some foods and drinks that may worsen your symptoms: Vegetables Tomatoes. Tomato juice. Tomato and spaghetti sauce. Chili peppers. Onion and garlic. Horseradish. Fruits Oranges, grapefruit, and lemon (fruit and juice). Meats High-fat meats, fish, and poultry. This includes hot dogs, ribs, ham, sausage, salami, and bacon. Dairy Whole milk and chocolate milk. Sour cream. Cream. Butter. Ice cream. Cream cheese.  Beverages Coffee and tea, with or without caffeine. Carbonated beverages or energy drinks. Condiments Hot sauce. Barbecue sauce.  Sweets/Desserts Chocolate and cocoa. Donuts. Peppermint and spearmint. Fats and Oils High-fat foods, including [place] fries and potato chips. Other Vinegar. Strong spices, such as black pepper, white pepper, red pepper, cayenne, curry powder, cloves, ginger, and chili powder. The items listed above may not be a complete list of foods and beverages to avoid. Contact your dietitian for more information.   This information is not intended to replace advice given to you by your health care provider. Make sure you discuss any questions you have with your health care provider.   Document Released: 02/14/2005 Document Revised: 03/07/2014 Document Reviewed: 12/19/2012 Elsevier Interactive Patient Education 2016 Elsevier Inc.   Low-Sodium Eating Plan Sodium raises blood pressure and causes water to be held in the body. Getting less sodium from food will help lower your blood pressure, reduce any swelling, and protect your heart, liver, and kidneys. We get sodium by  adding salt (sodium chloride) to food. Most of our sodium comes from canned, boxed, and frozen foods. Restaurant foods, fast foods, and pizza are also very high in sodium. Even if you take  medicine to lower your blood pressure or to reduce fluid in your body, getting less sodium from your food is important. WHAT IS MY PLAN? Most people should limit their sodium intake to 2,300 mg a day. Your health care provider recommends that you limit your sodium intake to __________ a day.  WHAT DO I NEED TO KNOW ABOUT THIS EATING PLAN? For the low-sodium eating plan, you will follow these general guidelines:  Choose foods with a % Daily Value for sodium of less than 5% (as listed on the food label).   Use salt-free seasonings or herbs instead of table salt or sea salt.   Check with your health care provider or pharmacist before using salt substitutes.   Eat fresh foods.  Eat more vegetables and fruits.  Limit canned vegetables. If you do use them, rinse them well to decrease the sodium.   Limit cheese to 1 oz (28 g) per day.   Eat lower-sodium products, often labeled as "lower sodium" or "no salt added."  Avoid foods that contain monosodium glutamate (MSG). MSG is sometimes added to [place] food and some canned foods.  Check food labels (Nutrition Facts labels) on foods to learn how much sodium is in one serving.  Eat more home-cooked food and less restaurant, buffet, and fast food.  When eating at a restaurant, ask that your food be prepared with less salt, or no salt if possible.  HOW DO I READ FOOD LABELS FOR SODIUM INFORMATION? The Nutrition Facts label lists the amount of sodium in one serving of the food. If you eat more than one serving, you must multiply the listed amount of sodium by the number of servings. Food labels may also identify foods as:  Sodium free--Less than 5 mg in a serving.  Very low sodium--35 mg or less in a serving.  Low sodium--140 mg or less in a serving.  Light in sodium--50% less sodium in a serving. For example, if a food that usually has 300 mg of sodium is changed to become light in sodium, it will have 150 mg of sodium.  Reduced  sodium--25% less sodium in a serving. For example, if a food that usually has 400 mg of sodium is changed to reduced sodium, it will have 300 mg of sodium. WHAT FOODS CAN I EAT? Grains Low-sodium cereals, including oats, puffed wheat and rice, and shredded wheat cereals. Low-sodium crackers. Unsalted rice and pasta. Lower-sodium bread.  Vegetables Frozen or fresh vegetables. Low-sodium or reduced-sodium canned vegetables. Low-sodium or reduced-sodium tomato sauce and paste. Low-sodium or reduced-sodium tomato and vegetable juices.  Fruits Fresh, frozen, and canned fruit. Fruit juice.  Meat and Other Protein Products Low-sodium canned tuna and salmon. Fresh or frozen meat, poultry, seafood, and fish. Lamb. Unsalted nuts. Dried beans, peas, and lentils without added salt. Unsalted canned beans. Homemade soups without salt. Eggs.  Dairy Milk. Soy milk. Ricotta cheese. Low-sodium or reduced-sodium cheeses. Yogurt.  Condiments Fresh and dried herbs and spices. Salt-free seasonings. Onion and garlic powders. Low-sodium varieties of mustard and ketchup. Fresh or refrigerated horseradish. Lemon juice.  Fats and Oils Reduced-sodium salad dressings. Unsalted butter.  Other Unsalted popcorn and pretzels.  The items listed above may not be a complete list of recommended foods or beverages. Contact your dietitian for more options. WHAT FOODS  ARE NOT RECOMMENDED? Grains Instant hot cereals. Bread stuffing, pancake, and biscuit mixes. Croutons. Seasoned rice or pasta mixes. Noodle soup cups. Boxed or frozen macaroni and cheese. Self-rising flour. Regular salted crackers. Vegetables Regular canned vegetables. Regular canned tomato sauce and paste. Regular tomato and vegetable juices. Frozen vegetables in sauces. Salted [place] fries. Olives. Angie Fava. Relishes. Sauerkraut. Salsa. Meat and Other Protein Products Salted, canned, smoked, spiced, or pickled meats, seafood, or fish. Bacon, ham,  sausage, hot dogs, corned beef, chipped beef, and packaged luncheon meats. Salt pork. Jerky. Pickled herring. Anchovies, regular canned tuna, and sardines. Salted nuts. Dairy Processed cheese and cheese spreads. Cheese curds. Blue cheese and cottage cheese. Buttermilk.  Condiments Onion and garlic salt, seasoned salt, table salt, and sea salt. Canned and packaged gravies. Worcestershire sauce. Tartar sauce. Barbecue sauce. Teriyaki sauce. Soy sauce, including reduced sodium. Steak sauce. Fish sauce. Oyster sauce. Cocktail sauce. Horseradish that you find on the shelf. Regular ketchup and mustard. Meat flavorings and tenderizers. Bouillon cubes. Hot sauce. Tabasco sauce. Marinades. Taco seasonings. Relishes. Fats and Oils Regular salad dressings. Salted butter. Margarine. Ghee. Bacon fat.  Other Potato and tortilla chips. Corn chips and puffs. Salted popcorn and pretzels. Canned or dried soups. Pizza. Frozen entrees and pot pies.  The items listed above may not be a complete list of foods and beverages to avoid. Contact your dietitian for more information.   This information is not intended to replace advice given to you by your health care provider. Make sure you discuss any questions you have with your health care provider.   Document Released: 08/06/2001 Document Revised: 03/07/2014 Document Reviewed: 12/19/2012 Elsevier Interactive Patient Education [employer].

## 2015-10-27 ENCOUNTER — Encounter: Payer: Self-pay | Admitting: Family Medicine

## 2015-10-28 ENCOUNTER — Telehealth: Payer: Self-pay | Admitting: *Deleted

## 2015-10-28 NOTE — Telephone Encounter (Signed)
Patient advised.

## 2015-10-28 NOTE — Telephone Encounter (Signed)
Her exam was normal.  I did not see anything that would benefit from an antibiotic, and unnecessary/inappropriate use of antibiotics is not only dangerous to the patient, but causing resistance which could effect others in the community.   I would be happy to prescribe her some viscous lidocaine that she can swish and spit or swish and swallow (if pain goes farther down the back of the throat) to use as needed for severe pain.  She should use tylenol as needed for throat pain, and continue salt water gargles.  She needs to be re-assessed if worsening/new symptoms

## 2015-10-28 NOTE — Telephone Encounter (Signed)
Patient called and her throat is worse. She is requesting either and abx be called in or possibly coming in for a shot of some sort. She does not believe this is related this is related to reflux and needs to be better by the weekend.

## 2015-10-29 DIAGNOSIS — R490 Dysphonia: Secondary | ICD-10-CM | POA: Diagnosis not present

## 2015-10-29 DIAGNOSIS — J343 Hypertrophy of nasal turbinates: Secondary | ICD-10-CM | POA: Diagnosis not present

## 2015-10-29 DIAGNOSIS — R070 Pain in throat: Secondary | ICD-10-CM | POA: Diagnosis not present

## 2015-11-05 DIAGNOSIS — H25813 Combined forms of age-related cataract, bilateral: Secondary | ICD-10-CM | POA: Diagnosis not present

## 2015-11-05 DIAGNOSIS — H1789 Other corneal scars and opacities: Secondary | ICD-10-CM | POA: Diagnosis not present

## 2015-11-05 DIAGNOSIS — H5203 Hypermetropia, bilateral: Secondary | ICD-10-CM | POA: Diagnosis not present

## 2015-11-06 ENCOUNTER — Other Ambulatory Visit (INDEPENDENT_AMBULATORY_CARE_PROVIDER_SITE_OTHER): Payer: PPO

## 2015-11-06 ENCOUNTER — Ambulatory Visit (INDEPENDENT_AMBULATORY_CARE_PROVIDER_SITE_OTHER): Payer: PPO | Admitting: Internal Medicine

## 2015-11-06 ENCOUNTER — Encounter: Payer: Self-pay | Admitting: Internal Medicine

## 2015-11-06 VITALS — BP 154/82 | HR 72 | Ht 62.0 in | Wt 129.2 lb

## 2015-11-06 DIAGNOSIS — J029 Acute pharyngitis, unspecified: Secondary | ICD-10-CM

## 2015-11-06 DIAGNOSIS — R140 Abdominal distension (gaseous): Secondary | ICD-10-CM

## 2015-11-06 DIAGNOSIS — R1032 Left lower quadrant pain: Secondary | ICD-10-CM

## 2015-11-06 DIAGNOSIS — R142 Eructation: Secondary | ICD-10-CM | POA: Diagnosis not present

## 2015-11-06 DIAGNOSIS — Z8719 Personal history of other diseases of the digestive system: Secondary | ICD-10-CM

## 2015-11-06 LAB — CBC WITH DIFFERENTIAL/PLATELET
BASOS ABS: 0 10*3/uL (ref 0.0–0.1)
Basophils Relative: 0.2 % (ref 0.0–3.0)
Eosinophils Absolute: 0.1 10*3/uL (ref 0.0–0.7)
Eosinophils Relative: 2 % (ref 0.0–5.0)
HEMATOCRIT: 36.6 % (ref 36.0–46.0)
Hemoglobin: 12.5 g/dL (ref 12.0–15.0)
LYMPHS PCT: 33.4 % (ref 12.0–46.0)
Lymphs Abs: 1.9 10*3/uL (ref 0.7–4.0)
MCHC: 34.1 g/dL (ref 30.0–36.0)
MCV: 88.5 fl (ref 78.0–100.0)
MONOS PCT: 7 % (ref 3.0–12.0)
Monocytes Absolute: 0.4 10*3/uL (ref 0.1–1.0)
Neutro Abs: 3.3 10*3/uL (ref 1.4–7.7)
Neutrophils Relative %: 57.4 % (ref 43.0–77.0)
Platelets: 339 10*3/uL (ref 150.0–400.0)
RBC: 4.13 Mil/uL (ref 3.87–5.11)
RDW: 12.9 % (ref 11.5–15.5)
WBC: 5.8 10*3/uL (ref 4.0–10.5)

## 2015-11-06 LAB — COMPREHENSIVE METABOLIC PANEL
ALT: 20 U/L (ref 0–35)
AST: 17 U/L (ref 0–37)
Albumin: 4.2 g/dL (ref 3.5–5.2)
Alkaline Phosphatase: 65 U/L (ref 39–117)
BUN: 14 mg/dL (ref 6–23)
CHLORIDE: 100 meq/L (ref 96–112)
CO2: 30 meq/L (ref 19–32)
Calcium: 8.8 mg/dL (ref 8.4–10.5)
Creatinine, Ser: 0.61 mg/dL (ref 0.40–1.20)
GFR: 102.41 mL/min (ref >60.00)
GLUCOSE: 102 mg/dL — AB (ref 70–99)
Potassium: 3.3 mEq/L — ABNORMAL LOW (ref 3.5–5.1)
SODIUM: 135 meq/L (ref 135–145)
TOTAL PROTEIN: 6.6 g/dL (ref 6.0–8.3)
Total Bilirubin: 0.3 mg/dL (ref 0.2–1.2)

## 2015-11-06 MED ORDER — PANTOPRAZOLE SODIUM 40 MG PO TBEC: 40.0000 mg | DELAYED_RELEASE_TABLET | Freq: Every day | ORAL | 3 refills | Status: DC

## 2015-11-06 MED ORDER — HYOSCYAMINE SULFATE 0.125 MG SL SUBL: 0.1250 mg | SUBLINGUAL_TABLET | SUBLINGUAL | 2 refills | Status: DC | PRN

## 2015-11-06 NOTE — Progress Notes (Signed)
HPI: [name] is a 70 year old female with a history of adenomatous colon polyps, colonic diverticulosis, anxiety and depression, osteopenia and vitamin D deficiency who is seen in consultation at the request of Dr. Tomi Bamberger to evaluate left lower quadrant abdominal pain. She reports that about 6 weeks ago when her trouble seemed to start when developed a sore throat which she saw both primary care and Dr. Redmond Baseman with ear nose and throat. He thought she may have a bacterial pharyngitis and she has been on Augmentin 875 twice a day times the last 8 days. This helped her sore throat initially though the last day or 2 her sore throat has returned. GI perspective she states she developed left lower quadrant pain after eating seafood on 10/15/2015. This was associated with nausea and vomiting but not diarrhea. Symptoms lasted 48-72 hours and improved. Now over the last 2 days she's had recurrent left lower quadrant discomfort with gas and bloating. Her appetite has been decreased and she is also had increase in belching and burping. She took about 5 days of over-the-counter Prilosec on advice from primary care but didn't see much improvement. She also started a new probiotic recommended by her daughter after she became ill in mid-August she was taking this once a day and is now taking it 1-2 times per day. She denies fever or chills but just simply states she feels poorly from abdominal bloating, gas and left lower quadrant pain.  Past Medical History:  Diagnosis Date  . Anxiety    sees [facility], [place])  . Colon polyps 08/2014   tubular adenoma and hyperplastic polyps (Dr. Hilarie Fredrickson)  . DDD (degenerative disc disease), cervical dr Patrice Paradise   s/p surgery, ongoing pain  . Depression    h/o hospitalization with ECT  . History of insomnia   . Neck pain   . Osteopenia stopped fosamax ~2006  . Post-operative nausea and vomiting   . Vitamin D deficiency borderline(28)-2010   treated by her GYN    Past  Surgical History:  Procedure Laterality Date  . BREAST REDUCTION SURGERY  08/2009  . HEMORRHOID SURGERY  2006, 12/2014  . KNEE ARTHROSCOPY Right   . LASIK     Dr. Lucita Ferrara  . NASAL SEPTOPLASTY W/ TURBINOPLASTY  1986  . NECK SURGERY  02/2010([facility])1/03,2005([facility])  . TOTAL HIP ARTHROPLASTY Left 08/2007        Outpatient Medications Prior to Visit  Medication Sig Dispense Refill  . Calcium Carbonate-Vit D-Min (CALCIUM 1200 PO) Take 1,200 mg by mouth daily.    . cholecalciferol (VITAMIN D) 1000 UNITS tablet Take 1,000 Units by mouth daily.    . Cyanocobalamin (B-12) 5000 MCG CAPS Take by mouth daily.    Marland Kitchen HYDROcodone-acetaminophen (NORCO) 10-325 MG per tablet Take 0.5 tablets by mouth as needed.     . lamoTRIgine (LAMICTAL) 150 MG tablet Take 150 mg by mouth at bedtime.      . Multiple Vitamin (MULTIVITAMIN) tablet Take 1 tablet by mouth daily.    Marland Kitchen zolpidem (AMBIEN) 10 MG tablet Take 2.5 mg by mouth at bedtime as needed.     . Probiotic Product (PROBIOTIC ADVANCED PO) Take 1 capsule by mouth daily.    . cetirizine (ZYRTEC) 10 MG tablet Take 10 mg by mouth daily. Reported on 07/06/2015     No facility-administered medications prior to visit.     Allergies  Allergen Reactions  . Cefdinir     REACTION: rash  . Cymbalta [Duloxetine Hcl]  Abdominal pain  . Nitrofurantoin [Macrodantin]   . Sulfamethoxazole Hives    Family History  Problem Relation Age of Onset  . Arthritis Mother   . Depression Mother   . Pernicious anemia Mother   . Heart disease Father 9    MI  . Depression Brother   . Diabetes Paternal Uncle   . Breast cancer Cousin     paternal    Social History  Substance Use Topics  . Smoking status: Never Smoker  . Smokeless tobacco: Never Used  . Alcohol use 4.2 oz/week    7 Glasses of wine per week     Comment: has 1 glass of wine most nights (occasionally 2)    ROS: As per history of present illness, otherwise negative  BP (!) 154/82 (BP Location: Left  Arm, Patient Position: Sitting, Cuff Size: Normal)   Pulse 72   Ht '5\' 2"'$  (1.575 m) Comment: height measured without shoes  Wt 129 lb 4 oz (58.6 kg)   LMP 08/15/1988 (Exact Date)   BMI 23.64 kg/m  Constitutional: Well-developed and well-nourished. No distress. HEENT: Normocephalic and atraumatic. Oropharynx is clear and moist. No oropharyngeal exudate. Conjunctivae are normal.  No scleral icterus. Neck: Neck supple. Trachea midline. Cardiovascular: Normal rate, regular rhythm and intact distal pulses. No M/R/G Pulmonary/chest: Effort normal and breath sounds normal. No wheezing, rales or rhonchi. Abdominal: Soft, Moderate left lower quadrant tenderness without rebound or guarding,, nondistended. Bowel sounds active throughout. There are no masses palpable.  Extremities: no clubbing, cyanosis, or edema Neurological: Alert and oriented to person place and time. Skin: Skin is warm and dry.  Psychiatric: Normal mood and affect. Behavior is normal.  RELEVANT LABS AND IMAGING: CBC    Component Value Date/Time   WBC 5.8 11/06/2015 1457   RBC 4.13 11/06/2015 1457   HGB 12.5 11/06/2015 1457   HCT 36.6 11/06/2015 1457   PLT 339.0 11/06/2015 1457   MCV 88.5 11/06/2015 1457   MCH 29.8 07/06/2015 0837   MCHC 34.1 11/06/2015 1457   RDW 12.9 11/06/2015 1457   LYMPHSABS 1.9 11/06/2015 1457   MONOABS 0.4 11/06/2015 1457   EOSABS 0.1 11/06/2015 1457   BASOSABS 0.0 11/06/2015 1457    CMP     Component Value Date/Time   NA 135 11/06/2015 1457   K 3.3 (L) 11/06/2015 1457   CL 100 11/06/2015 1457   CO2 30 11/06/2015 1457   GLUCOSE 102 (H) 11/06/2015 1457   BUN 14 11/06/2015 1457   CREATININE 0.61 11/06/2015 1457   CREATININE 0.67 07/06/2015 0837   CALCIUM 8.8 11/06/2015 1457   PROT 6.6 11/06/2015 1457   ALBUMIN 4.2 11/06/2015 1457   AST 17 11/06/2015 1457   ALT 20 11/06/2015 1457   ALKPHOS 65 11/06/2015 1457   BILITOT 0.3 11/06/2015 1457   GFRNONAA >60 09/05/2007 0415   GFRAA   09/05/2007 0415    >60        The eGFR has been calculated using the MDRD equation. This calculation has not been validated in all clinical    ASSESSMENT/PLAN: 70 year old female with a history of adenomatous colon polyps, colonic diverticulosis, anxiety and depression, osteopenia and vitamin D deficiency who is seen in consultation at the request of Dr. Tomi Bamberger to evaluate left lower quadrant abdominal pain.  1. LLQ pain/abd bloating -- symptoms clinically are consistent with diverticulitis however she has been on Augmentin 875 twice a day 8 days which should be expected to resolve diverticulitis. Given her ongoing pain I recommended  CT scan of the abdomen and pelvis with IV contrast to rule out diverticulitis and complication thereof. Of also recommended CBC and CMP. Stop probiotic for now as this may be worsening bloating. Levsin 0.125 mg every 4 hours as needed for left lower quadrant crampy pain. She is advised to go to the ER this weekend if pain is worsening or should she develop fever greater than 101, nausea or vomiting or bleeding. She voiced understanding  2. Burping/belching/sore throat -- possibly related to reflux. Trial of pantoprazole 40 mg daily 30 minutes before breakfast 1-2 months.  Further recommendations after reviewing cross-sectional imaging     Cc:Eve [place], [address] [place], [place] [id number]

## 2015-11-06 NOTE — Patient Instructions (Signed)
Please discontinue your probiotic.  Finish Augmentin.  Your physician has requested that you go to the basement for the following lab work before leaving today: CBC, CMP  We have sent the following medications to your pharmacy for you to pick up at your convenience: Pantoprazole 40 mg daily Levsin 0.125-1 tablet   You have been scheduled for a CT scan of the abdomen and pelvis at [facility] ([address]---this is in the same building as Press [facility]).   You are scheduled on Friday, 11/13/15 at 2:30 pm. You should arrive 15 minutes prior to your appointment time for registration. Please follow the written instructions below on the day of your exam:  WARNING: IF YOU ARE ALLERGIC TO IODINE/X-RAY DYE, PLEASE NOTIFY RADIOLOGY IMMEDIATELY AT [phone]! YOU WILL BE GIVEN A 13 HOUR PREMEDICATION PREP.  1) Do not eat or drink anything after 10:30 am (4 hours prior to your test) 2) You have been given 2 bottles of oral contrast to drink. The solution may taste better if refrigerated, but do NOT add ice or any other liquid to this solution. Shake well before drinking.    Drink 1 bottle of contrast @ 12:30 pm (2 hours prior to your exam)  Drink 1 bottle of contrast @ 1:30 pm (1 hour prior to your exam)  You may take any medications as prescribed with a small amount of water except for the following: Metformin, Glucophage, Glucovance, Avandamet, Riomet, Fortamet, Actoplus Met, Janumet, Glumetza or Metaglip. The above medications must be held the day of the exam AND 48 hours after the exam.  The purpose of you drinking the oral contrast is to aid in the visualization of your intestinal tract. The contrast solution may cause some diarrhea. Before your exam is started, you will be given a small amount of fluid to drink. Depending on your individual set of symptoms, you may also receive an intravenous injection of x-ray contrast/dye. Plan on being at [facility] for 30  minutes or longer, depending on the type of exam you are having performed.  This test typically takes 30-45 minutes to complete.  If you have any questions regarding your exam or if you need to reschedule, you may call the CT department at [phone] between the hours of 8:00 am and 5:00 pm, Monday-Friday.  ________________________________________________________________________ If you are age 41 or older, your body mass index should be between 23-30. Your Body mass index is 23.64 kg/m. If this is out of the aforementioned range listed, please consider follow up with your Primary Care Provider.  If you are age 16 or younger, your body mass index should be between 19-25. Your Body mass index is 23.64 kg/m. If this is out of the aformentioned range listed, please consider follow up with your Primary Care Provider.

## 2015-11-09 ENCOUNTER — Telehealth: Payer: Self-pay | Admitting: Internal Medicine

## 2015-11-09 NOTE — Telephone Encounter (Signed)
Reviewed CT scan appointment information and instructions with pt and she verbalized understanding.

## 2015-11-13 ENCOUNTER — Ambulatory Visit (INDEPENDENT_AMBULATORY_CARE_PROVIDER_SITE_OTHER)
Admission: RE | Admit: 2015-11-13 | Discharge: 2015-11-13 | Disposition: A | Discharge status: Home / Self Care | Payer: PPO | Source: Ambulatory Visit | Attending: Internal Medicine | Admitting: Internal Medicine

## 2015-11-13 DIAGNOSIS — K5790 Diverticulosis of intestine, part unspecified, without perforation or abscess without bleeding: Secondary | ICD-10-CM | POA: Diagnosis not present

## 2015-11-13 DIAGNOSIS — R1032 Left lower quadrant pain: Secondary | ICD-10-CM | POA: Diagnosis not present

## 2015-11-13 MED ORDER — IOPAMIDOL (ISOVUE-300) INJECTION 61%
100.0000 mL | Freq: Once | INTRAVENOUS | Status: AC | PRN
  Administered 2015-11-13: 100 mL via INTRAVENOUS

## 2015-12-23 DIAGNOSIS — K219 Gastro-esophageal reflux disease without esophagitis: Secondary | ICD-10-CM | POA: Diagnosis not present

## 2015-12-23 DIAGNOSIS — R070 Pain in throat: Secondary | ICD-10-CM | POA: Diagnosis not present

## 2016-01-14 DIAGNOSIS — M542 Cervicalgia: Secondary | ICD-10-CM | POA: Diagnosis not present

## 2016-01-14 DIAGNOSIS — Z79891 Long term (current) use of opiate analgesic: Secondary | ICD-10-CM | POA: Diagnosis not present

## 2016-01-14 DIAGNOSIS — G894 Chronic pain syndrome: Secondary | ICD-10-CM | POA: Diagnosis not present

## 2016-01-14 DIAGNOSIS — M5002 Cervical disc disorder with myelopathy, mid-cervical region, unspecified level: Secondary | ICD-10-CM | POA: Diagnosis not present

## 2016-01-14 DIAGNOSIS — Z79899 Other long term (current) drug therapy: Secondary | ICD-10-CM | POA: Diagnosis not present

## 2016-01-28 DIAGNOSIS — M542 Cervicalgia: Secondary | ICD-10-CM | POA: Diagnosis not present

## 2016-01-28 DIAGNOSIS — F3342 Major depressive disorder, recurrent, in full remission: Secondary | ICD-10-CM | POA: Diagnosis not present

## 2016-02-03 DIAGNOSIS — M542 Cervicalgia: Secondary | ICD-10-CM | POA: Diagnosis not present

## 2016-02-17 DIAGNOSIS — J209 Acute bronchitis, unspecified: Secondary | ICD-10-CM | POA: Diagnosis not present

## 2016-03-02 ENCOUNTER — Ambulatory Visit (INDEPENDENT_AMBULATORY_CARE_PROVIDER_SITE_OTHER): Payer: PPO | Admitting: Family Medicine

## 2016-03-02 ENCOUNTER — Encounter: Payer: Self-pay | Admitting: Family Medicine

## 2016-03-02 VITALS — BP 122/80 | HR 72 | Temp 98.2°F | Ht 62.0 in | Wt 128.0 lb

## 2016-03-02 DIAGNOSIS — N644 Mastodynia: Secondary | ICD-10-CM

## 2016-03-02 NOTE — Progress Notes (Signed)
Chief Complaint  Patient presents with  . Breast Pain    left breast pain-like an ache, comes and goes. Very sore. No discharge.    She started with left breast pain about a month ago.  This persists, and now the right breast is also becoming painful.  She isn't sure if there is a lump.  It is sore below the breast, tender to touch.  She bought new bras, not using wired bra, but hasn't improved.  Also now has pain in the upper breast. Denies any nipple drainage/discharge.  H/o breast reduction in the past. Drinks 1.5 cups of caffeine/day.  Mammogram 07/06/15: ACR Breast Density Category b: There are scattered areas of fibroglandular density.  FINDINGS: There are no findings suspicious for malignancy. Images were processed with CAD.  IMPRESSION: No mammographic evidence of malignancy. A result letter of this screening mammogram will be mailed directly to the patient.   PMH, PSH, SH reviewed, no changes to FH.  Denies any breast cancer in the family.  Outpatient Encounter Prescriptions as of 03/02/2016  Medication Sig Note  . Calcium Carbonate-Vit D-Min (CALCIUM 1200 PO) Take 1,200 mg by mouth daily.   . cholecalciferol (VITAMIN D) 1000 UNITS tablet Take 1,000 Units by mouth daily.   . Cyanocobalamin (B-12) 5000 MCG CAPS Take by mouth daily.   Marland Kitchen HYDROcodone-acetaminophen (NORCO) 10-325 MG per tablet Take 0.5 tablets by mouth as needed.  10/26/2015: Takes 1/2 tablet once a day or every other day, for neck pain  . lamoTRIgine (LAMICTAL) 150 MG tablet Take 150 mg by mouth at bedtime.     . Multiple Vitamin (MULTIVITAMIN) tablet Take 1 tablet by mouth daily.   . pantoprazole (PROTONIX) 40 MG tablet Take 1 tablet (40 mg total) by mouth daily.   Marland Kitchen zolpidem (AMBIEN) 10 MG tablet Take 2.5 mg by mouth at bedtime as needed.  07/06/2015: Takes 1/4th tablet every night (from psych)  . [DISCONTINUED] hyoscyamine (LEVSIN SL) 0.125 MG SL tablet Place 1 tablet (0.125 mg total) under the tongue every 4  (four) hours as needed. (Patient not taking: Reported on 03/02/2016)    No facility-administered encounter medications on file as of 03/02/2016.    Allergies  Allergen Reactions  . Cefdinir     REACTION: rash  . Cymbalta [Duloxetine Hcl]     Abdominal pain  . Nitrofurantoin [Macrodantin]   . Sulfamethoxazole Hives    She was put on PPI by Dr. Hilarie Fredrickson. Sore throat resolved (why she was put on PPI).  She had bronchitis prior to Christmas.  Denies any URI symptoms currently. Denies fever, chills, heartburn, nausea, vomiting, diarrhea. Denies bleeding, bruising, rashes.   PHYSICAL EXAM:  BP 122/80 (BP Location: Left Arm, Patient Position: Sitting, Cuff Size: Normal)   Pulse 72   Temp 98.2 F (36.8 C) (Tympanic)   Ht 5\' 2"  (1.575 m)   Wt 128 lb (58.1 kg)   LMP 08/15/1988 (Exact Date)   BMI 23.41 kg/m   Pleasant female in no distress Breasts: WHSS bilaterally. No nipple discharge. She is diffusely tender at left breast at the 4-5 o'clock position, with a small focal area of tenderness inferiorly at 6 o'clock.  Diffuse fibroglandular changes noted L>R. Mild tenderness in R UOQ. Mildly tender at costochondral junctions at the level of the nipples and below, as well at the lower costal margin and floating ribs No axillary lymphadenopathy Abdomen: nontender  ASSESSMENT/PLAN:  Breast pain, left - Plan: US BREAST COMPLETE UNI LEFT INC AXILLA, MM DIAG BREAST  TOMO UNI LEFT, CANCELED: MM Digital Diagnostic Unilat L  Component of musculoskeletal pain  Diagnostic mammo and US of the left breast  We are scheduling you for a diagnostic mammogram and ultrasound of the left breast.  You will be due for your bilateral screening mammogram in May. I think there is also a musculoskeletal component to your discomfort in the chest.  Try taking 1-2 Aleve twice daily with food for 7-10 days (start with 1; if it doesn't help and doesn't bother your stomach, you can take 2 together).  You can also try  warm compresses to the tender areas of the chest.

## 2016-03-02 NOTE — Patient Instructions (Signed)
  We are scheduling you for a diagnostic mammogram and ultrasound of the left breast.  You will be due for your bilateral screening mammogram in May. I think there is also a musculoskeletal component to your discomfort in the chest.  Try taking 1-2 Aleve twice daily with food for 7-10 days (start with 1; if it doesn't help and doesn't bother your stomach, you can take 2 together).  You can also try warm compresses to the tender areas of the chest.

## 2016-03-03 ENCOUNTER — Other Ambulatory Visit: Payer: Self-pay | Admitting: Family Medicine

## 2016-03-03 ENCOUNTER — Ambulatory Visit
Admission: RE | Admit: 2016-03-03 | Discharge: 2016-03-03 | Disposition: A | Discharge status: Home / Self Care | Payer: PPO | Source: Ambulatory Visit | Attending: Family Medicine | Admitting: Family Medicine

## 2016-03-03 DIAGNOSIS — N644 Mastodynia: Secondary | ICD-10-CM

## 2016-03-08 ENCOUNTER — Other Ambulatory Visit: Payer: PPO

## 2016-03-09 DIAGNOSIS — M542 Cervicalgia: Secondary | ICD-10-CM | POA: Diagnosis not present

## 2016-03-18 DIAGNOSIS — J069 Acute upper respiratory infection, unspecified: Secondary | ICD-10-CM | POA: Diagnosis not present

## 2016-03-21 ENCOUNTER — Encounter: Payer: Self-pay | Admitting: Medical

## 2016-03-21 ENCOUNTER — Ambulatory Visit (INDEPENDENT_AMBULATORY_CARE_PROVIDER_SITE_OTHER): Payer: PPO | Admitting: Medical

## 2016-03-21 VITALS — BP 126/80 | HR 78 | Temp 97.8°F | Wt 128.6 lb

## 2016-03-21 DIAGNOSIS — J209 Acute bronchitis, unspecified: Secondary | ICD-10-CM | POA: Diagnosis not present

## 2016-03-21 MED ORDER — AZITHROMYCIN 250 MG PO TABS: ORAL_TABLET | ORAL | 0 refills | Status: DC

## 2016-03-21 MED ORDER — PROMETHAZINE-DM 6.25-15 MG/5ML PO SYRP: 5.0000 mL | ORAL_SOLUTION | Freq: Four times a day (QID) | ORAL | 0 refills | Status: DC | PRN

## 2016-03-21 NOTE — Progress Notes (Signed)
Subjective: Chief Complaint  Patient presents with  . chest congestion    chest congestion , coughing    Here for not feeling well.   Started a week ago with horrible cold symptoms, cough, congestion, and has persisted with worsening head and chest congestion, sinus pressure, sore throat, ear pressure, feels like its going into her chest.    Went to urgent care last week, was given inhaler and tessalon Perles.   The inhaler helps some.  The Perles aren't helping.   Hasn't been out of the house since Friday not feeling well, no energy.   No fever, no NVD.   No sick contacts, nonsmoker.  No other aggravating or relieving factors. No other complaint.  Past Medical History:  Diagnosis Date  . Anxiety    sees [facility], [place])  . Colon polyps 08/2014   tubular adenoma and hyperplastic polyps (Dr. Hilarie Fredrickson)  . DDD (degenerative disc disease), cervical dr Patrice Paradise   s/p surgery, ongoing pain  . Depression    h/o hospitalization with ECT  . History of insomnia   . Neck pain   . Osteopenia stopped fosamax ~2006  . Post-operative nausea and vomiting   . Vitamin D deficiency borderline(28)-2010   treated by her GYN    Current Outpatient Prescriptions on File Prior to Visit  Medication Sig Dispense Refill  . Calcium Carbonate-Vit D-Min (CALCIUM 1200 PO) Take 1,200 mg by mouth daily.    . cholecalciferol (VITAMIN D) 1000 UNITS tablet Take 1,000 Units by mouth daily.    . Cyanocobalamin (B-12) 5000 MCG CAPS Take by mouth daily.    Marland Kitchen HYDROcodone-acetaminophen (NORCO) 10-325 MG per tablet Take 0.5 tablets by mouth as needed.     . lamoTRIgine (LAMICTAL) 150 MG tablet Take 150 mg by mouth at bedtime.      . Multiple Vitamin (MULTIVITAMIN) tablet Take 1 tablet by mouth daily.    . pantoprazole (PROTONIX) 40 MG tablet Take 1 tablet (40 mg total) by mouth daily. 90 tablet 3  . zolpidem (AMBIEN) 10 MG tablet Take 2.5 mg by mouth at bedtime as needed.      No current facility-administered  medications on file prior to visit.    ROS as in subjective   Objective: BP 126/80   Pulse 78   Temp 97.8 F (36.6 C)   Wt 128 lb 9.6 oz (58.3 kg)   LMP 08/15/1988 (Exact Date)   SpO2 96%   BMI 23.52 kg/m   General appearance: Alert, WD/WN, no distress, ill appearing                             Skin: warm, no rash, no diaphoresis                           Head: no sinus tenderness                            Eyes: conjunctiva normal, corneas clear, PERRLA                            Ears: pearly TMs, external ear canals normal                          Nose: septum midline, turbinates swollen, with erythema and clear  discharge             Mouth/throat: MMM, tongue normal, mild pharyngeal erythema                           Neck: supple, no adenopathy, no thyromegaly, non tender                          Heart: RRR, normal S1, S2, no murmurs                         Lungs: +bronchial breath sounds, faint dullness right lower field with percussion, no wheezes, no rales                Extremities: no edema, non tender        Assessment: Encounter Diagnosis  Name Primary?  . Acute bronchitis, unspecified organism Yes     Plan: Vitals stable, discussed symtpoms, concerns.  given worsening symptoms, exam findings, begin zpak, can use the Promethazine DM since the Perles aren't helping.  C/t albuterol inhaler 2 puffs every q4-6 hours.  Rest, hydrate well.   Call/return if worse or not improving in the next several days.

## 2016-03-25 ENCOUNTER — Encounter: Payer: Self-pay | Admitting: Medical

## 2016-03-25 ENCOUNTER — Telehealth: Payer: Self-pay

## 2016-03-25 ENCOUNTER — Other Ambulatory Visit: Payer: Self-pay

## 2016-03-25 ENCOUNTER — Ambulatory Visit
Admission: RE | Admit: 2016-03-25 | Discharge: 2016-03-25 | Disposition: A | Discharge status: Home / Self Care | Payer: PPO | Source: Ambulatory Visit | Attending: Medical | Admitting: Medical

## 2016-03-25 ENCOUNTER — Other Ambulatory Visit: Payer: Self-pay | Admitting: Medical

## 2016-03-25 ENCOUNTER — Ambulatory Visit (INDEPENDENT_AMBULATORY_CARE_PROVIDER_SITE_OTHER): Payer: PPO | Admitting: Medical

## 2016-03-25 VITALS — BP 134/84 | HR 79 | Temp 97.7°F | Wt 128.2 lb

## 2016-03-25 DIAGNOSIS — R059 Cough, unspecified: Secondary | ICD-10-CM

## 2016-03-25 DIAGNOSIS — R05 Cough: Secondary | ICD-10-CM | POA: Diagnosis not present

## 2016-03-25 DIAGNOSIS — R0602 Shortness of breath: Secondary | ICD-10-CM

## 2016-03-25 MED ORDER — PROMETHAZINE-DM 6.25-15 MG/5ML PO SYRP: 5.0000 mL | ORAL_SOLUTION | Freq: Four times a day (QID) | ORAL | 0 refills | Status: DC | PRN

## 2016-03-25 MED ORDER — BUDESONIDE-FORMOTEROL FUMARATE 160-4.5 MCG/ACT IN AERO: 2.0000 | INHALATION_SPRAY | Freq: Two times a day (BID) | RESPIRATORY_TRACT | 0 refills | Status: DC

## 2016-03-25 MED ORDER — PROAIR HFA 108 (90 BASE) MCG/ACT IN AERS: 2.0000 | INHALATION_SPRAY | RESPIRATORY_TRACT | 1 refills | Status: DC | PRN

## 2016-03-25 NOTE — Telephone Encounter (Signed)
Pt states that she has taken azithromycin last dose today. She states chest still hurts.Not improved since Monday. Advised pt to make appt. Appt scheduled for today 1145. [address]

## 2016-03-25 NOTE — Progress Notes (Signed)
Subjective: Chief Complaint  Patient presents with  . congestion , coughing    congestion , coughing x 2 weeks    Here for not feeling well.   I saw her 03/21/16 for same.  Doesn't feel much improved at all.  Thursday had a little improvement.  Has worse cough, sinus pressure, ear pressure.  Started almost 2 weeks ago with horrible cold symptoms, cough, congestion, and has persisted with worsening head and chest congestion, sinus pressure, sore throat, ear pressure, feels like its going into her chest.   Hasn't been out of the house since Friday not feeling well, no energy.   No fever, no NVD.   No sick contacts, nonsmoker.  Had asthma until 71yo.  No other aggravating or relieving factors. No other complaint.  Past Medical History:  Diagnosis Date  . Anxiety    sees [facility], [place])  . Colon polyps 08/2014   tubular adenoma and hyperplastic polyps (Dr. Hilarie Fredrickson)  . DDD (degenerative disc disease), cervical dr Patrice Paradise   s/p surgery, ongoing pain  . Depression    h/o hospitalization with ECT  . History of insomnia   . Neck pain   . Osteopenia stopped fosamax ~2006  . Post-operative nausea and vomiting   . Vitamin D deficiency borderline(28)-2010   treated by her GYN    Current Outpatient Prescriptions on File Prior to Visit  Medication Sig Dispense Refill  . azithromycin (ZITHROMAX) 250 MG tablet 2 tablets day 1, then 1 tablet days 2-4 6 tablet 0  . Calcium Carbonate-Vit D-Min (CALCIUM 1200 PO) Take 1,200 mg by mouth daily.    . cholecalciferol (VITAMIN D) 1000 UNITS tablet Take 1,000 Units by mouth daily.    . Cyanocobalamin (B-12) 5000 MCG CAPS Take by mouth daily.    Marland Kitchen lamoTRIgine (LAMICTAL) 150 MG tablet Take 150 mg by mouth at bedtime.      . Multiple Vitamin (MULTIVITAMIN) tablet Take 1 tablet by mouth daily.    . pantoprazole (PROTONIX) 40 MG tablet Take 1 tablet (40 mg total) by mouth daily. 90 tablet 3  . promethazine-dextromethorphan (PROMETHAZINE-DM) 6.25-15 MG/5ML  syrup Take 5 mLs by mouth 4 (four) times daily as needed for cough. 60 mL 0  . zolpidem (AMBIEN) 10 MG tablet Take 2.5 mg by mouth at bedtime as needed.     Marland Kitchen HYDROcodone-acetaminophen (NORCO) 10-325 MG per tablet Take 0.5 tablets by mouth as needed.      No current facility-administered medications on file prior to visit.    ROS as in subjective   Objective: BP 134/84   Pulse 79   Temp 97.7 F (36.5 C)   Wt 128 lb 3.2 oz (58.2 kg)   LMP 08/15/1988 (Exact Date)   SpO2 98%   BMI 23.45 kg/m   General appearance: Alert, WD/WN, no distress, ill appearing                             Skin: warm, no rash, no diaphoresis                           Head: no sinus tenderness                            Eyes: conjunctiva normal, corneas clear, PERRLA  Ears: pearly TMs, external ear canals normal                          Nose: septum midline, turbinates swollen, with erythema and clear discharge             Mouth/throat: MMM, tongue normal, mild pharyngeal erythema                           Neck: supple, no adenopathy, no thyromegaly, non tender                          Heart: RRR, normal S1, S2, no murmurs                         Lungs: +bronchial breath sounds, faint dullness right lower field with percussion, no wheezes, no rales                Extremities: no edema, non tender        Assessment: Encounter Diagnoses  Name Primary?  . Cough Yes  . SOB (shortness of breath)      Plan: Gave sample of Symbicort, discussed proper use.   Will send for CXR. Rest, hydrate well, and f/u pending CXR.   reviewed most recent labs in the chart.   [name] was seen today for congestion , coughing.  Diagnoses and all orders for this visit:  Cough -     DG Chest 2 View; Future  SOB (shortness of breath) -     DG Chest 2 View; Future  Other orders -     budesonide-formoterol (SYMBICORT) 160-4.5 MCG/ACT inhaler; Inhale 2 puffs into the lungs 2 (two) times  daily.

## 2016-04-13 DIAGNOSIS — K219 Gastro-esophageal reflux disease without esophagitis: Secondary | ICD-10-CM | POA: Diagnosis not present

## 2016-04-13 DIAGNOSIS — R05 Cough: Secondary | ICD-10-CM | POA: Diagnosis not present

## 2016-04-13 DIAGNOSIS — R070 Pain in throat: Secondary | ICD-10-CM | POA: Diagnosis not present

## 2016-05-18 ENCOUNTER — Ambulatory Visit: Payer: PPO | Admitting: Nurse Practitioner

## 2016-05-23 ENCOUNTER — Ambulatory Visit: Payer: PPO | Admitting: Family Medicine

## 2016-05-23 ENCOUNTER — Ambulatory Visit (INDEPENDENT_AMBULATORY_CARE_PROVIDER_SITE_OTHER): Payer: PPO | Admitting: Medical

## 2016-05-23 ENCOUNTER — Encounter: Payer: Self-pay | Admitting: Medical

## 2016-05-23 VITALS — BP 126/70 | HR 64 | Temp 98.4°F | Wt 126.2 lb

## 2016-05-23 DIAGNOSIS — R5383 Other fatigue: Secondary | ICD-10-CM | POA: Diagnosis not present

## 2016-05-23 DIAGNOSIS — R05 Cough: Secondary | ICD-10-CM | POA: Diagnosis not present

## 2016-05-23 DIAGNOSIS — R6889 Other general symptoms and signs: Secondary | ICD-10-CM | POA: Diagnosis not present

## 2016-05-23 DIAGNOSIS — R059 Cough, unspecified: Secondary | ICD-10-CM

## 2016-05-23 LAB — CBC WITH DIFFERENTIAL/PLATELET
BASOS ABS: 40 {cells}/uL (ref 0–200)
Basophils Relative: 1 %
EOS ABS: 40 {cells}/uL (ref 15–500)
Eosinophils Relative: 1 %
HCT: 37.2 % (ref 35.0–45.0)
Hemoglobin: 12.5 g/dL (ref 11.7–15.5)
Lymphocytes Relative: 38 %
Lymphs Abs: 1520 cells/uL (ref 850–3900)
MCH: 29.4 pg (ref 27.0–33.0)
MCHC: 33.6 g/dL (ref 32.0–36.0)
MCV: 87.5 fL (ref 80.0–100.0)
MONOS PCT: 10 %
MPV: 9.3 fL (ref 7.5–12.5)
Monocytes Absolute: 400 cells/uL (ref 200–950)
NEUTROS ABS: 2000 {cells}/uL (ref 1500–7800)
NEUTROS PCT: 50 %
PLATELETS: 244 10*3/uL (ref 140–400)
RBC: 4.25 MIL/uL (ref 3.80–5.10)
RDW: 13.6 % (ref 11.0–15.0)
WBC: 4 10*3/uL (ref 4.0–10.5)

## 2016-05-23 LAB — BASIC METABOLIC PANEL
BUN: 16 mg/dL (ref 7–25)
CHLORIDE: 99 mmol/L (ref 98–110)
CO2: 26 mmol/L (ref 20–31)
Calcium: 8.5 mg/dL — ABNORMAL LOW (ref 8.6–10.4)
Creat: 0.65 mg/dL (ref 0.60–0.93)
GLUCOSE: 90 mg/dL (ref 65–99)
Potassium: 3.7 mmol/L (ref 3.5–5.3)
Sodium: 136 mmol/L (ref 135–146)

## 2016-05-23 LAB — TSH: TSH: 0.97 mIU/L

## 2016-05-23 MED ORDER — AMOXICILLIN 875 MG PO TABS: 875.0000 mg | ORAL_TABLET | Freq: Two times a day (BID) | ORAL | 0 refills | Status: DC

## 2016-05-23 MED ORDER — PREDNISONE 20 MG PO TABS: ORAL_TABLET | ORAL | 0 refills | Status: DC

## 2016-05-23 MED ORDER — HYDROCOD POLST-CPM POLST ER 10-8 MG/5ML PO SUER: 5.0000 mL | Freq: Two times a day (BID) | ORAL | 0 refills | Status: DC

## 2016-05-23 NOTE — Progress Notes (Signed)
Subjective: Chief Complaint  Patient presents with  . URI    congestion , coughing x 1 week , no fever , sore throat earaches   Here for cough, congestion.    I saw her twice in January for cough, bronchitis.   She saw allergist after last visit, was given a different inhaler and prednisone and finally got better.  Had 8 weeks of ongoing cough.  She had gotten improvement for about 2-3 weeks until now.   Been doing ok until a week ago.   Husband was sick last week, and they were on a bus trip with others this past, around some other sick contacts.  She notes deep cough, sore throat, ear ache, head pressure, nasal discharge.  Is getting productive cough.  no energy, started a week ago.  Using Proair inhaler 2 puffs BID since last week.  Feels worsening.   No swelling in legs, no chest pain, no DOE.     Past Medical History:  Diagnosis Date  . Anxiety    sees [facility], [place])  . Colon polyps 08/2014   tubular adenoma and hyperplastic polyps (Dr. Hilarie Fredrickson)  . DDD (degenerative disc disease), cervical dr Patrice Paradise   s/p surgery, ongoing pain  . Depression    h/o hospitalization with ECT  . History of insomnia   . Neck pain   . Osteopenia stopped fosamax ~2006  . Post-operative nausea and vomiting   . Vitamin D deficiency borderline(28)-2010   treated by her GYN   Current Outpatient Prescriptions on File Prior to Visit  Medication Sig Dispense Refill  . Calcium Carbonate-Vit D-Min (CALCIUM 1200 PO) Take 1,200 mg by mouth daily.    . cholecalciferol (VITAMIN D) 1000 UNITS tablet Take 1,000 Units by mouth daily.    . Cyanocobalamin (B-12) 5000 MCG CAPS Take by mouth daily.    Marland Kitchen lamoTRIgine (LAMICTAL) 150 MG tablet Take 150 mg by mouth at bedtime.      . Multiple Vitamin (MULTIVITAMIN) tablet Take 1 tablet by mouth daily.    Marland Kitchen PROAIR HFA 108 (90 Base) MCG/ACT inhaler Inhale 2 puffs into the lungs every 4 (four) hours as needed for wheezing or shortness of breath. 1 Inhaler 1  .  promethazine-dextromethorphan (PROMETHAZINE-DM) 6.25-15 MG/5ML syrup Take 5 mLs by mouth 4 (four) times daily as needed for cough. 60 mL 0  . zolpidem (AMBIEN) 10 MG tablet Take 2.5 mg by mouth at bedtime as needed.      No current facility-administered medications on file prior to visit.    Review of Systems Constitutional: -fever, -chills, -sweats, -unexpected weight change,-fatigue Cardiology:  -chest pain, -palpitations, -edema Respiratory: -shortness of breath, -wheezing Gastroenterology: -abdominal pain, -nausea, -vomiting, -diarrhea, -constipation Hematology: -bleeding or bruising problems Musculoskeletal: -arthralgias, -myalgias, -joint swelling, -back pain Ophthalmology: -vision changes Urology: -dysuria, -difficulty urinating, -hematuria, -urinary frequency, -urgency Neurology: -headache, -weakness, -tingling, -numbness    Objective: BP 126/70   Pulse 64   Temp 98.4 F (36.9 C)   Wt 126 lb 3.2 oz (57.2 kg)   LMP 08/15/1988 (Exact Date)   SpO2 96%   BMI 23.08 kg/m   Wt Readings from Last 3 Encounters:  05/23/16 126 lb 3.2 oz (57.2 kg)  03/25/16 128 lb 3.2 oz (58.2 kg)  03/21/16 128 lb 9.6 oz (58.3 kg)   General appearance: Alert, WD/WN, no distress,somewhat ill appearing  Skin: warm, no rash, no diaphoresis                           Head: no sinus tenderness                            Eyes: conjunctiva normal, corneas clear, PERRLA                            Ears: pearly TMs, external ear canals normal                          Nose: septum midline, turbinates swollen, with erythema and clear discharge             Mouth/throat: MMM, tongue normal, mild pharyngeal erythema                           Neck: supple, no adenopathy, no thyromegaly, non tender                          Heart: RRR, normal S1, S2, no murmurs                         Lungs: +bronchial breath sounds, no rhonchi, no wheezes, no rales                Extremities: no edema,  non tender       Assessment: Encounter Diagnoses  Name Primary?  . Cough Yes  . Cold intolerance   . Fatigue, unspecified type      Plan: Discussed symptoms, cough, and how long it took her to get over the illness back in January.  Advised rest, hydration, c/t Proair inhaler 2 puffs every 6 hours prn, begin medications below prednisolone short term 3 day burst and tussionex prn..   Can use OTC robitussin DM.  If worse or not improving in the next few days, then begin amoxicillin.    F/u if worse or not improving within the week     Layann was seen today for uri.  Diagnoses and all orders for this visit:  Cough -     Basic metabolic panel -     CBC with Differential/Platelet -     TSH  Cold intolerance -     Basic metabolic panel -     CBC with Differential/Platelet -     TSH  Fatigue, unspecified type  Other orders -     chlorpheniramine-HYDROcodone (TUSSIONEX PENNKINETIC ER) 10-8 MG/5ML SUER; Take 5 mLs by mouth 2 (two) times daily. -     predniSONE (DELTASONE) 20 MG tablet; 1 tablet daily for 3 days -     amoxicillin (AMOXIL) 875 MG tablet; Take 1 tablet (875 mg total) by mouth 2 (two) times daily.

## 2016-05-24 ENCOUNTER — Ambulatory Visit: Payer: PPO | Admitting: Nurse Practitioner

## 2016-06-15 ENCOUNTER — Encounter: Payer: Self-pay | Admitting: Nurse Practitioner

## 2016-06-15 ENCOUNTER — Ambulatory Visit (INDEPENDENT_AMBULATORY_CARE_PROVIDER_SITE_OTHER): Payer: PPO | Admitting: Nurse Practitioner

## 2016-06-15 VITALS — BP 142/84 | HR 76 | Ht 61.75 in | Wt 128.0 lb

## 2016-06-15 DIAGNOSIS — Z0000 Encounter for general adult medical examination without abnormal findings: Secondary | ICD-10-CM | POA: Diagnosis not present

## 2016-06-15 DIAGNOSIS — Z01411 Encounter for gynecological examination (general) (routine) with abnormal findings: Secondary | ICD-10-CM

## 2016-06-15 DIAGNOSIS — E559 Vitamin D deficiency, unspecified: Secondary | ICD-10-CM | POA: Diagnosis not present

## 2016-06-15 NOTE — Patient Instructions (Signed)

## 2016-06-15 NOTE — Progress Notes (Signed)
Patient ID: [name], female   DOB: 1946-02-03, 70 y.o.   MRN: [id number]  71 y.o. [id number] Married  Caucasian Fe here for annual exam.  No new problems.  Still having cervical neck pain - now aggravated with doing yoga on Monday.  May need to stop yoga.  She and husband are going on [employer] in [place] with friends.  Patient's last menstrual period was 08/15/1988 (exact date).          Sexually active: No.  The current method of family planning is abstinence.    Exercising: Yes.    yoga and walking Smoker:  no  Health Maintenance: Pap: 04/15/14, Negative  11/29/10, Negative History of Abnormal Pap: no MMG: 07/06/15, Bi-Rads 1: Negative, Diagnostic left breast mammogram and ultrasound 03/03/16 due to pain and nodule; Bi-Rads 1:  Negative, return to bilateral screening 06/2016 Self Breast exams: yes Colonoscopy:09/16/14, Tubular Adenoma x 1, hyperplastic polyp x 1, repeat in 5 years BMD: 07/22/15 T Score: Spine not testing / -1.9 Right Femur Neck / Left Femur Neck not done / -1.9 Left 1/3 Radius TDaP: 08/29/2007 Shingles: 08/29/2007 Pneumonia: 07/06/15 Pneumovax, 07/02/14 Prevnar-13, 08/29/2007 Pneumovax Hep C: 05/13/15 Labs: PCP takes care of screening labs, we follow Vit D   reports that she has never smoked. She has never used smokeless tobacco. She reports that she drinks about 4.2 oz of alcohol per week . She reports that she does not use drugs.  Past Medical History:  Diagnosis Date  . Anxiety    sees [facility]be [place], [place])  . Colon polyps 08/2014   tubular adenoma and hyperplastic polyps (Dr. Hilarie Fredrickson)  . DDD (degenerative disc disease), cervical dr Patrice Paradise   s/p surgery, ongoing pain  . Depression    h/o hospitalization with ECT  . History of insomnia   . Neck pain   . Osteopenia stopped fosamax ~2006  . Post-operative nausea and vomiting   . Vitamin D deficiency borderline(28)-2010   treated by her GYN    Past Surgical History:  Procedure Laterality Date  . BREAST  REDUCTION SURGERY  08/2009  . HEMORRHOID SURGERY  2006, 12/2014  . KNEE ARTHROSCOPY Right   . LASIK     Dr. Lucita Ferrara  . NASAL SEPTOPLASTY W/ TURBINOPLASTY  1986  . NECK SURGERY  02/2010([facility])1/03,2005([facility])  . TOTAL HIP ARTHROPLASTY Left 08/2007        Current Outpatient Prescriptions  Medication Sig Dispense Refill  . Calcium Carbonate-Vit D-Min (CALCIUM 1200 PO) Take 1,200 mg by mouth daily.    . cholecalciferol (VITAMIN D) 1000 UNITS tablet Take 1,000 Units by mouth daily.    . Cyanocobalamin (B-12) 5000 MCG CAPS Take by mouth daily.    Marland Kitchen lamoTRIgine (LAMICTAL) 150 MG tablet Take 150 mg by mouth at bedtime.      . Multiple Vitamin (MULTIVITAMIN) tablet Take 1 tablet by mouth daily.    Marland Kitchen PROAIR HFA 108 (90 Base) MCG/ACT inhaler Inhale 2 puffs into the lungs every 4 (four) hours as needed for wheezing or shortness of breath. 1 Inhaler 1  . zolpidem (AMBIEN) 10 MG tablet Take 2.5 mg by mouth at bedtime as needed.      No current facility-administered medications for this visit.     Family History  Problem Relation Age of Onset  . Arthritis Mother   . Depression Mother   . Pernicious anemia Mother   . Heart disease Father 77    MI  . Depression Brother   . Diabetes  Paternal Uncle   . Breast cancer Cousin     paternal    ROS:  Pertinent items are noted in HPI.  Otherwise, a comprehensive ROS was negative.  Exam:   BP (!) 142/84 (BP Location: Right Arm, Patient Position: Sitting, Cuff Size: Normal)   Pulse 76   Ht 5' 1.75" (1.568 m)   Wt 128 lb (58.1 kg)   LMP 08/15/1988 (Exact Date)   BMI 23.60 kg/m  Height: 5' 1.75" (156.8 cm) Ht Readings from Last 3 Encounters:  06/15/16 5' 1.75" (1.568 m)  03/02/16 5\' 2"  (1.575 m)  11/06/15 5\' 2"  (1.575 m)    General appearance: alert, cooperative and appears stated age Head: Normocephalic, without obvious abnormality, atraumatic Neck: no adenopathy, supple, symmetrical, trachea midline and thyroid normal to inspection and  palpation Lungs: clear to auscultation bilaterally Breasts: normal appearance, no masses or tenderness Heart: regular rate and rhythm Abdomen: soft, non-tender; no masses,  no organomegaly Extremities: extremities normal, atraumatic, no cyanosis or edema Skin: Skin color, texture, turgor normal. No rashes or lesions Lymph nodes: Cervical, supraclavicular, and axillary nodes normal. No abnormal inguinal nodes palpated Neurologic: Grossly normal   Pelvic: External genitalia:  no lesions              Urethra:  normal appearing urethra with no masses, tenderness or lesions              Bartholin's and Skene's: normal                 Vagina: normal appearing vagina with normal color and discharge, no lesions              Cervix: anteverted              Pap taken: No. Bimanual Exam:  Uterus:  normal size, contour, position, consistency, mobility, non-tender              Adnexa: no mass, fullness, tenderness               Rectovaginal: Confirms               Anus:  normal sphincter tone, no lesions  Chaperone present: yes  A:  Well Woman with normal exam  Postmenopausal no HRT Chronic cervical neck pain Vit D deficient             S/P Hemorrhoid surgery              P:   Reviewed health and wellness pertinent to exam  Pap smear: no  Mammogram is due 06/2016  Will check Vit D and follow  Counseled on breast self exam, mammography screening, adequate intake of calcium and vitamin D, diet and exercise return annually or prn  An After Visit Summary was printed and given to the patient.

## 2016-06-16 LAB — VITAMIN D 25 HYDROXY (VIT D DEFICIENCY, FRACTURES): Vit D, 25-Hydroxy: 29 ng/mL — ABNORMAL LOW (ref 30–100)

## 2016-06-16 NOTE — Progress Notes (Signed)
Encounter reviewed by Dr. Lusine Corlett Amundson C. Silva.  

## 2016-06-17 ENCOUNTER — Telehealth: Payer: Self-pay | Admitting: *Deleted

## 2016-06-17 NOTE — Telephone Encounter (Signed)
Spoke with patient. Advised of results as seen below from [place]. Patient verbalizes understanding. Aware she will need to be taking Vitamin D 1000 IU daily without missing doses will need Vitamin D recheck at next aex. Patient is agreeable.  Routing to provider for final review. Patient agreeable to disposition. Will close encounter.

## 2016-06-17 NOTE — Telephone Encounter (Signed)
I have attempted to contact this patient by phone with the following results: left message to return call to [place] at [phone] answering machine (home per [facility]).  No personal information given. [phone] (Home) *Preferred*

## 2016-06-17 NOTE — Telephone Encounter (Signed)
-----   Message from Regina Eck, CNM sent at 06/16/2016  1:15 PM EDT ----- Notify patient that vitamin D is 29 needs protocol and consistent use

## 2016-06-17 NOTE — Telephone Encounter (Signed)
Patient returned call

## 2016-06-24 ENCOUNTER — Telehealth: Payer: Self-pay

## 2016-06-24 NOTE — Telephone Encounter (Signed)
Records faxed to [facility] [phone] per signed release. Victorino December

## 2016-07-04 DIAGNOSIS — Z0279 Encounter for issue of other medical certificate: Secondary | ICD-10-CM

## 2016-07-05 NOTE — Progress Notes (Signed)
Chief Complaint  Patient presents with  . Medicare Wellness    fasting AWV/CPE no pelvic sees [name] and is UTD. Sees [name] for eye exams and is also UTD. Saw [name] times and still has lingering cough.     [name] is a 71 y.o. female who presents for annual exam, Medicare wellness visit and follow-up on chronic medical conditions.  She has the following concerns:  "Lingering cough". She saw [name] in January (twice), and last Huerta/26/18. She had seen allergist after January visits, given different inhaler and prednisone, cough resolved.  Cough recurred a week before her March visit, with new sick contacts, felt to have URI. Given rx's for amoxil and prednisone '20mg'$  x 3d.  She reports she got better, but not completely, and now cough is getting worse.  Cough started getting worse last night when at someone's house for a church meeting--not sure if related to a candle or the cat that was there. Dry hacky, wheezy cough since last night.  She didn't use her inhaler yesterday (but didn't find it much help with cough at other times). She is having slight discomfort in her chest.  She has been taking zyrtec, but hasn't affected the cough. She also tried Flonase--has been using daily for a week. She took some leftover cough syrup (not the last one prescribed). She denies any reflux or heartburn. Cough is often triggered when in church or bible study.  Vitamin D deficiency--this is monitored by GYN [name]).  Level was 29 on 06/15/16. Advised to be compliant with taking 1000 IU daily, with plans to recheck in a year (she had missed some).   Osteopenia: Last DEXA was 07/22/15.  T-1.9 at R fem neck and L forearm.  There was no significant change from 2 years prior.  FRAX score was normal. Review of the chart shows that she previously took fosamax, back in 2006. She stopped it on her own, due to fear of side effects.   Chronic neck pain--Seeing physical therapist ([name], [name]) and  getting dry needling.  She has this done only twice, it is painful to do, but helps. She sees Dr. Towanda Malkin Huerta (and get her hydrocodone from them).  She uses 1/2 tablet --no longer needs daily, now about 3x/week at most. She has had injections by [name], also had different type of injection at [name] office.  Hasn't gotten much benefit from these treatments. She previously mentioned that the hydrocodone makes her a little shaky--head didn't shake as much when she stopped it for a short while. She hasn't noticed any head tremor in a while, but has slight tremor in her hands sometimes. She also recalls being on Lyrica for just a short time (was afraid of the side effects), and also may have taken Cymbalta in the past--there is documented side effect of abdominal pain in her allergy list. Pain is worse when it rains.  sometimes takes '600mg'$  ibuprofen with some relief.   Depression and insomnia--sees psychiatrist, doing well on current regimen. Stable on current regimen, goes once yearly.  Immunization History  Administered Date(s) Administered  . Influenza Split 11/13/2015  . Influenza, High Dose Seasonal PF 03/05/2014, 12/15/2014  . Influenza-Unspecified 12/29/2012  . Pneumococcal Conjugate-13 07/02/2014  . Pneumococcal Polysaccharide-23 08/29/2007, 07/06/2015  . Tdap 08/29/2007  . Zoster 08/29/2007   Last Pap smear: 03/2014 with GYN, normal; last GYN exam 05/2016 Last mammogram: 07/06/15, Bi-Rads 1: Negative, Diagnostic left breast mammogram and ultrasound 03/03/16  due to pain and nodule; Bi-Rads 1:  Negative, return to bilateral screening 06/2016 Last colonoscopy: 09/16/14, Tubular Adenoma x 1, hyperplastic polyp x 1, repeat in 5 years Last DEXA:  07/22/15 T Score: Spine not testing / -1.9 Right Femur Neck / Left Femur Neck not done / -1.9 Left 1/Huerta Radius Dentist: twice yearly Ophtho: yearly Exercise:  yoga 2 times/week, some weights, and limited walking due to weather. Hep C screen: negative,  done by GYN Huerta/2017 Lipids:  Lab Results  Component Value Date   CHOL 187 07/07/2014   HDL 72 07/07/2014   LDLCALC 94 07/07/2014   TRIG 105 07/07/2014   CHOLHDL 2.6 07/07/2014    Other doctors caring for patient include: GYN: [name] Dentist: [name] Ophtho: [name] Dermatologist: [name] Podiatrist: [name] Ortho-spine: [name] Ortho--[name] (seen in the past for L hip bursitis) Neurosurgeon--[name] (no longer sees) Pain doctor--[name], also has seen [name] in the past (no longer sees either) Psych: [name] ([facility]) GI: [name]; ENT: [name] Allergist: [name] Physical Therapist: Linton Huerta ([place] Huerta)   Depression screen: negative Fall screen: negative Functional status screen: unremarkable .  See questionnaires in epic.   End of Life Discussion:  Patient does have a living will and medical power of attorney   Past Medical History:  Diagnosis Date  . Anxiety    sees [name])  . Colon polyps 08/2014   tubular adenoma and hyperplastic polyps ([name])  . DDD (degenerative disc disease), cervical dr Patrice Paradise   s/p surgery, ongoing pain  . Depression    h/o hospitalization with ECT  . History of insomnia   . Neck pain   . Osteopenia stopped fosamax ~2006  . Post-operative nausea and vomiting   . Vitamin D deficiency borderline(28)-2010   treated by her GYN    Past Surgical History:  Procedure Laterality Date  . BREAST REDUCTION SURGERY  08/2009  . HEMORRHOID SURGERY  2006, 12/2014  . KNEE ARTHROSCOPY Right   . LASIK     [name]  . NASAL SEPTOPLASTY W/ TURBINOPLASTY  1986  . NECK SURGERY  02/2010([facility])1/03,2005([facility])  . TOTAL HIP ARTHROPLASTY Left 08/2007        Social History   Social History  . Marital status: Married    Spouse name: N/A  . Number of children: Huerta  . Years of education: N/A   Occupational History  . Not on file.    Social History Main Topics  . Smoking status: Never Smoker  . Smokeless tobacco: Never Used  . Alcohol use 4.2 oz/week    7 Glasses of wine per week     Comment: has 1 glass of wine most nights (occasionally 2) or 1/2 martini  . Drug use: No  . Sexual activity: Not Currently    Birth control/ protection: Post-menopausal   Other Topics Concern  . Not on file   Social History Narrative   Lives with husband. Has Huerta kids, 4 grandchildren. Daughter (Kendra--drug rep, lives here in [place])   Other children live in [place] and [place].    Family History  Problem Relation Age of Onset  . Arthritis Mother   . Depression Mother   . Pernicious anemia Mother   . Heart disease Father 100    MI  . Depression Brother   . Diabetes Paternal Uncle   . Breast cancer Cousin  paternal    Outpatient Encounter Prescriptions as of 07/06/2016  Medication Sig Note  . Calcium Carbonate-Vit D-Min (CALCIUM 1200 PO) Take 1,200 mg by mouth daily.   . cholecalciferol (VITAMIN D) 1000 UNITS tablet Take 2,000 Units by mouth daily.    . Cyanocobalamin (B-12) 5000 MCG CAPS Take by mouth daily.   Marland Kitchen lamoTRIgine (LAMICTAL) 150 MG tablet Take 150 mg by mouth at bedtime.     . Multiple Vitamin (MULTIVITAMIN) tablet Take 1 tablet by mouth daily.   Marland Kitchen PROAIR HFA 108 (90 Base) MCG/ACT inhaler Inhale 2 puffs into the lungs every 4 (four) hours as needed for wheezing or shortness of breath. 07/06/2016: Using prn, twice daily--hasn't used it much in the last 2 weeks  . zolpidem (AMBIEN) 10 MG tablet Take 2.5 mg by mouth at bedtime as needed.  07/06/2015: Takes 1/4th tablet every night (from psych)   No facility-administered encounter medications on file as of 07/06/2016.     Allergies  Allergen Reactions  . Cefdinir     REACTION: rash  . Cymbalta [Duloxetine Hcl]     Abdominal pain  . Nitrofurantoin [Macrodantin]   . Sulfamethoxazole Hives    ROS: The patient denies anorexia, fever, weight changes,  headaches, vision changes, ear pain, sore throat, breast concerns, chest pain, palpitations, dizziness, syncope, dyspnea on exertion, swelling, nausea, vomiting, diarrhea, constipation, abdominal pain, melena, hematochezia, indigestion/heartburn, hematuria, incontinence, dysuria, vaginal bleeding, discharge, odor or itch, genital lesions, numbness, tingling, weakness, tremor, suspicious skin lesions, depression, anxiety, abnormal bleeding/bruising, or enlarged lymph nodes. +hearing loss (has hearing aids, uses intermittently) Occasional mild hand tremor when drinking coffee. Her daughter hasn't mentioned any head-bob/tremor recently h/o childhood asthma, better after age 9 until recent bouts requiring inhaler. +cough since last night.  See HPI  PHYSICAL EXAM:  BP 128/76 (BP Location: Left Arm, Patient Position: Sitting, Cuff Size: Normal)   Pulse 68   Ht '5\' 2"'$  (1.575 m)   Wt 126 lb 12.8 oz (57.5 kg)   LMP 08/15/1988 (Exact Date)   BMI 23.19 kg/m    Wt Readings from Last Huerta Encounters:  07/06/16 126 lb 12.8 oz (57.5 kg)  06/15/16 128 lb (58.1 kg)  05/23/16 126 lb Huerta.2 oz (57.2 kg)   Patient reports current pain level to be 7/10. She appears comfortable.  General Appearance:   Alert, cooperative, no distress, appears stated age. Frequent throat clearing during visit. No coughing.  Head:   Normocephalic, without obvious abnormality, atraumatic  Eyes:   PERRL, conjunctiva/corneas clear, EOM's intact, fundi benign  Ears:   Normal TM's and external ear canals  Nose:  Nares normal, mucosa normal, no drainage or sinus tenderness  Throat:  Lips, mucosa, and tongue normal; teeth and gums normal  Neck:  Supple, no lymphadenopathy; thyroid: no enlargement/tenderness/nodules; no carotid bruit or JVD. WHSS posteriorly (nontender)  Back:  lumbar and thoracic spine nontender, no curvature, ROM normal, no CVA tenderness. tender L trapezius muscle/upper rhomboid  Lungs:    Clear to auscultation bilaterally without wheezes, rales or ronchi; respirations unlabored  Chest Wall:   No tenderness or deformity  Heart:   Regular rate and rhythm, S1 and S2 normal, no murmur, rub or gallop  Breast Exam:   Deferred to GYN  Abdomen:   Soft, non-tender, nondistended, normoactive bowel sounds, no masses, no hepatosplenomegaly  Genitalia:   Deferred to GYN     Extremities:  No clubbing, cyanosis or edema.   Pulses:  2+ and symmetric all extremities  Skin:  Skin color, texture, turgor normal, no rashes or lesions  Lymph nodes:  Cervical, supraclavicular, and axillary nodes normal  Neurologic:  CNII-XII intact, normal strength, sensation and gait; reflexes 2+, slightly decreased DTR in LUE.   Psych: Normal mood, affect, hygiene and grooming.  ASSESSMENT/PLAN:  Annual physical exam - Plan: POCT Urinalysis Dipstick  Encounter for Medicare annual wellness exam  Vitamin D deficiency - encouraged compliance with daily supplement  Cough - reassurred no e/o infection. Given throat clearing, discussed PND/allergies vs reflux. Continue allergy meds; mucinex prn. Tessalon prn  Osteopenia, unspecified location - discussed Ca, D, weight-bearing exercise. recheck DEXA due next year  Chronic neck pain - continue Huerta/dry needling, advil prn, heat, stretches     use benzonatate (and call for refill if needed). use guaifenesin (ie Mucinex) regularly until cough better and as needed.  no labs needed (had TSH, CBC, b-met in March, D in April, last lipids fine)  Full Code, Full Care Encouraged to get [place] copies of her Living Will, healthcare power of attorney (additional paperwork given in case what she just recently did with lawyer was different).  Discussed monthly self breast exams and yearly mammograms; at least 30 minutes of aerobic activity at least 5 days/week, weight-bearing exercise at least 2x/wk; proper sunscreen  use reviewed; healthy diet, including goals of calcium and vitamin D intake and alcohol recommendations (less than or equal to 1 drink/day) reviewed; regular seatbelt use; changing batteries in smoke detectors. Immunization recommendations discussed--continue yearly flu shots . Td next year. Shingrix recommended, risks/side effects discussed. Colonoscopy recommendations reviewed--UTD, due again 08/2019.     Medicare Attestation I have personally reviewed: The patient's medical and social history Their use of alcohol, tobacco or illicit drugs Their current medications and supplements The patient's functional ability including ADLs,fall risks, home safety risks, cognitive, and hearing and visual impairment Diet and physical activities Evidence for depression or mood disorders  The patient's weight, height, BMI, and visual acuity have been recorded in the chart.  I have made referrals, counseling, and provided education to the patient based on review of the above and I have provided the patient with a written personalized care plan for preventive services.

## 2016-07-06 ENCOUNTER — Ambulatory Visit (INDEPENDENT_AMBULATORY_CARE_PROVIDER_SITE_OTHER): Payer: PPO | Admitting: Family Medicine

## 2016-07-06 ENCOUNTER — Encounter: Payer: Self-pay | Admitting: Family Medicine

## 2016-07-06 ENCOUNTER — Other Ambulatory Visit: Payer: Self-pay | Admitting: Family Medicine

## 2016-07-06 VITALS — BP 128/76 | HR 68 | Ht 62.0 in | Wt 126.8 lb

## 2016-07-06 DIAGNOSIS — R05 Cough: Secondary | ICD-10-CM

## 2016-07-06 DIAGNOSIS — M8580 Other specified disorders of bone density and structure, unspecified site: Secondary | ICD-10-CM | POA: Diagnosis not present

## 2016-07-06 DIAGNOSIS — E559 Vitamin D deficiency, unspecified: Secondary | ICD-10-CM | POA: Diagnosis not present

## 2016-07-06 DIAGNOSIS — Z0000 Encounter for general adult medical examination without abnormal findings: Secondary | ICD-10-CM

## 2016-07-06 DIAGNOSIS — R059 Cough, unspecified: Secondary | ICD-10-CM

## 2016-07-06 DIAGNOSIS — M542 Cervicalgia: Secondary | ICD-10-CM

## 2016-07-06 DIAGNOSIS — G8929 Other chronic pain: Secondary | ICD-10-CM | POA: Diagnosis not present

## 2016-07-06 DIAGNOSIS — Z1231 Encounter for screening mammogram for malignant neoplasm of breast: Secondary | ICD-10-CM

## 2016-07-06 LAB — POCT URINALYSIS DIPSTICK
Bilirubin, UA: NEGATIVE
Glucose, UA: NEGATIVE
Ketones, UA: NEGATIVE
LEUKOCYTES UA: NEGATIVE
Nitrite, UA: NEGATIVE
Protein, UA: NEGATIVE
Spec Grav, UA: 1.02 (ref 1.010–1.025)
UROBILINOGEN UA: NEGATIVE U/dL — AB
pH, UA: 6.5 (ref 5.0–8.0)

## 2016-07-06 NOTE — Patient Instructions (Addendum)
  HEALTH MAINTENANCE RECOMMENDATIONS:  It is recommended that you get at least 30 minutes of aerobic exercise at least 5 days/week (for weight loss, you may need as much as 60-90 minutes). This can be any activity that gets your heart rate up. This can be divided in 10-15 minute intervals if needed, but try and build up your endurance at least once a week.  Weight bearing exercise is also recommended twice weekly.  Eat a healthy diet with lots of vegetables, fruits and fiber.  "Colorful" foods have a lot of vitamins (ie green vegetables, tomatoes, red peppers, etc).  Limit sweet tea, regular sodas and alcoholic beverages, all of which has a lot of calories and sugar.  Up to 1 alcoholic drink daily may be beneficial for women (unless trying to lose weight, watch sugars).  Drink a lot of water.  Calcium recommendations are 1200-1500 mg daily (1500 mg for postmenopausal women or women without ovaries), and vitamin D 1000 IU daily.  This should be obtained from diet and/or supplements (vitamins), and calcium should not be taken all at once, but in divided doses.  Monthly self breast exams and yearly mammograms for women over the age of 44 is recommended. DUE NOW--please schedule  Sunscreen of at least SPF 30 should be used on all sun-exposed parts of the skin when outside between the hours of 10 am and 4 pm (not just when at beach or pool, but even with exercise, golf, tennis, and yard work!)  Use a sunscreen that says "broad spectrum" so it covers both UVA and UVB rays, and make sure to reapply every 1-2 hours.  Remember to change the batteries in your smoke detectors when changing your clock times in the spring and fall.  Use your seat belt every time you are in a car, and please drive safely and not be distracted with cell phones and texting while driving.   [name] , Thank you for taking time to come for your Medicare Wellness Visit. I appreciate your ongoing commitment to your health goals.  Please review the following plan we discussed and let me know if I can assist you in the future.   These are the goals we discussed: Goals    None      This is a list of the screening recommended for you and due dates:  Health Maintenance  Topic Date Due  . Mammogram  07/05/2016  . Flu Shot  09/28/2016  . Tetanus Vaccine  08/28/2017  . Colon Cancer Screening  09/16/2019  . DEXA scan (bone density measurement)  Completed  .  Hepatitis C: One time screening is recommended by Center for Disease Control  (CDC) for  adults born from [birth year] through 1965.   Completed  . Pneumonia vaccines  Completed   Please schedule your mammogram. Continue yearly high dose flu shots (in the fall)  I recommend getting the new shingles vaccine (Shingrix). You will need to check with your insurance to see if it is covered, and if covered by Medicare Part D, you need to get from the pharmacy rather than our office.  It is a series of 2 injections, spaced 2 months apart.  Cough: Continue Flonase and Zyrtec. use benzonatate (and call for refill if needed). use guaifenesin (ie Mucinex) regularly until cough better and as needed. Use the albuterol for any chest tightness, shortness of breath.

## 2016-07-13 DIAGNOSIS — M25512 Pain in left shoulder: Secondary | ICD-10-CM | POA: Diagnosis not present

## 2016-07-13 DIAGNOSIS — M542 Cervicalgia: Secondary | ICD-10-CM | POA: Diagnosis not present

## 2016-07-21 ENCOUNTER — Ambulatory Visit
Admission: RE | Admit: 2016-07-21 | Discharge: 2016-07-21 | Disposition: A | Discharge status: Home / Self Care | Payer: PPO | Source: Ambulatory Visit | Attending: Family Medicine | Admitting: Family Medicine

## 2016-07-21 DIAGNOSIS — Z1231 Encounter for screening mammogram for malignant neoplasm of breast: Secondary | ICD-10-CM | POA: Diagnosis not present

## 2016-07-26 DIAGNOSIS — F3342 Major depressive disorder, recurrent, in full remission: Secondary | ICD-10-CM | POA: Diagnosis not present

## 2016-08-04 ENCOUNTER — Encounter: Payer: Self-pay | Admitting: Podiatry

## 2016-08-04 ENCOUNTER — Ambulatory Visit (INDEPENDENT_AMBULATORY_CARE_PROVIDER_SITE_OTHER): Payer: PPO | Admitting: Podiatry

## 2016-08-04 ENCOUNTER — Ambulatory Visit (INDEPENDENT_AMBULATORY_CARE_PROVIDER_SITE_OTHER): Payer: PPO

## 2016-08-04 DIAGNOSIS — M79672 Pain in left foot: Secondary | ICD-10-CM | POA: Diagnosis not present

## 2016-08-04 DIAGNOSIS — D3610 Benign neoplasm of peripheral nerves and autonomic nervous system, unspecified: Secondary | ICD-10-CM

## 2016-08-04 DIAGNOSIS — M79671 Pain in right foot: Secondary | ICD-10-CM | POA: Diagnosis not present

## 2016-08-04 NOTE — Progress Notes (Signed)
Subjective:    Patient ID: [name], female   DOB: 71 y.o.   MRN: [id number]   HPI patient states she's not been seen for several years and she started develop a lot of sharp shooting pains in both feet with the right being worse and she is due to go on a big trip at the end of the month    ROS      Objective:  Physical Exam neurovascular status intact with positive Biagio Borg sign with shooting pains third interspace both feet with right hurting more than the left and radiating discomfort with a palpable mass     Assessment:    Probable neuroma symptomatology bilateral     Plan:   H&P x-rays reviewed and today injected directly into the nerve prior to breaking into digital branches with a purified alcohol Marcaine solution 1.5 mL bilateral. We will consider second injection most likely just in the right one in 2 weeks depending on response  X-ray report indicates no signs stress fracture arthritis or any other kind of bone pathology

## 2016-08-05 DIAGNOSIS — M5441 Lumbago with sciatica, right side: Secondary | ICD-10-CM | POA: Diagnosis not present

## 2016-08-10 ENCOUNTER — Telehealth: Payer: Self-pay | Admitting: *Deleted

## 2016-08-10 ENCOUNTER — Telehealth: Payer: Self-pay | Admitting: Family Medicine

## 2016-08-10 NOTE — Telephone Encounter (Signed)
Patient called and is going on a 2 week cruise to [place] and [place] and she always gets sick when she goes on cruises. Wants to know if you would call her in a zpak to take with her just in case she gets sick? [facility]ses [facility] [address].

## 2016-08-10 NOTE — Telephone Encounter (Signed)
Pt is preparing to go on a cruise to [place]. She still has the chest congestion that she has been suffering with since around January. Pt requesting a Zpak or some antibiotic to take while on the cruise to help fight off what she has and the other germs on the ship that she may be susceptible to getting.

## 2016-08-10 NOTE — Telephone Encounter (Signed)
I saw the other message first. She was asking for zpak to have just in case for cruise.  This message is a little different.  When she was seen last month, there was no evidence of infection.  If she develops fever, discolored mucus, then okay to take z-pak now, otherwise, take it with her to use prn developing an infection, NOT to try and prevent anything.  Many germs she will be exposed to on the ship are viruses (especially GI viruses such as norovirus), and this medication will not help treat or prevent those.  Strict hand washing, hand sanitizer, etc is most important in preventing these type of illnesses.  Consider bringing a mask on the plane, if there are others near her that are sick/coughing, to prevent exposure as well.

## 2016-08-10 NOTE — Telephone Encounter (Signed)
zpak is just to cover sinus and respiratory infections. If that is what she usually comes down with, okay to send in one z-pak.  She shouldn't take just at onset of cold symptoms/congestion, but for persistently discolored mucus/phlegm, facial pain, fever, wheezing.

## 2016-08-11 ENCOUNTER — Other Ambulatory Visit: Payer: Self-pay

## 2016-08-11 MED ORDER — AZITHROMYCIN 250 MG PO TABS: ORAL_TABLET | ORAL | 0 refills | Status: DC

## 2016-08-11 NOTE — Telephone Encounter (Signed)
Pt informed word for word and she verbalized understanding

## 2016-08-11 NOTE — Telephone Encounter (Signed)
Pt has been informed word for word and verbalized understanding

## 2016-08-18 ENCOUNTER — Encounter: Payer: Self-pay | Admitting: Podiatry

## 2016-08-18 ENCOUNTER — Ambulatory Visit (INDEPENDENT_AMBULATORY_CARE_PROVIDER_SITE_OTHER): Payer: PPO | Admitting: Podiatry

## 2016-08-18 DIAGNOSIS — D3610 Benign neoplasm of peripheral nerves and autonomic nervous system, unspecified: Secondary | ICD-10-CM

## 2016-08-18 NOTE — Progress Notes (Signed)
Subjective:    Patient ID: [name], female   DOB: 71 y.o.   MRN: [id number]   HPI patient presents stating she's getting ready to go to [place] and still has quite a bit of pain in her right foot    ROS      Objective:  Physical Exam neurovascular status intact with continued shooting discomforts third interspace right foot with radiating discomfort into the adjacent digits     Assessment:    Neuroma symptomatology right with shooting-like discomfort and positive Biagio Borg sign     Plan:    H&P condition reviewed and went ahead today and did a injection into the third interspace with a purified alcohol Marcaine solution which was tolerated well and patient will be seen back to recheck

## 2016-09-08 ENCOUNTER — Ambulatory Visit (INDEPENDENT_AMBULATORY_CARE_PROVIDER_SITE_OTHER): Payer: PPO | Admitting: Podiatry

## 2016-09-08 DIAGNOSIS — D3610 Benign neoplasm of peripheral nerves and autonomic nervous system, unspecified: Secondary | ICD-10-CM | POA: Diagnosis not present

## 2016-09-08 NOTE — Progress Notes (Signed)
Subjective:    Patient ID: [name], female   DOB: 71 y.o.   MRN: [id number]   HPI patient states feeling quite a bit better with occasional shooting pains between the third and fourth toes right    ROS      Objective:  Physical Exam neurovascular status intact with continued discomfort in the third interspace right with mild shooting radiating discomfort     Assessment:    Neuroma symptomatology improved but present     Plan:    Sterile prep right and administered injection of her 5 alcohol Marcaine solution third interspace right which was tolerated well

## 2016-09-20 DIAGNOSIS — R05 Cough: Secondary | ICD-10-CM | POA: Diagnosis not present

## 2016-09-20 DIAGNOSIS — K219 Gastro-esophageal reflux disease without esophagitis: Secondary | ICD-10-CM | POA: Diagnosis not present

## 2016-09-20 DIAGNOSIS — R070 Pain in throat: Secondary | ICD-10-CM | POA: Diagnosis not present

## 2016-09-20 DIAGNOSIS — H66001 Acute suppurative otitis media without spontaneous rupture of ear drum, right ear: Secondary | ICD-10-CM | POA: Diagnosis not present

## 2016-09-27 ENCOUNTER — Telehealth: Payer: Self-pay | Admitting: Obstetrics & Gynecology

## 2016-09-27 NOTE — Telephone Encounter (Signed)
Left message for patient to reschedule Patty Grubb appt. °

## 2016-09-29 DIAGNOSIS — H90A31 Mixed conductive and sensorineural hearing loss, unilateral, right ear with restricted hearing on the contralateral side: Secondary | ICD-10-CM | POA: Diagnosis not present

## 2016-09-29 DIAGNOSIS — H6501 Acute serous otitis media, right ear: Secondary | ICD-10-CM | POA: Diagnosis not present

## 2016-10-20 DIAGNOSIS — K219 Gastro-esophageal reflux disease without esophagitis: Secondary | ICD-10-CM | POA: Diagnosis not present

## 2016-10-20 DIAGNOSIS — R062 Wheezing: Secondary | ICD-10-CM | POA: Diagnosis not present

## 2016-10-20 DIAGNOSIS — R070 Pain in throat: Secondary | ICD-10-CM | POA: Diagnosis not present

## 2016-10-21 ENCOUNTER — Other Ambulatory Visit: Payer: Self-pay | Admitting: Allergy and Immunology

## 2016-10-21 ENCOUNTER — Ambulatory Visit
Admission: RE | Admit: 2016-10-21 | Discharge: 2016-10-21 | Disposition: A | Discharge status: Home / Self Care | Payer: PPO | Source: Ambulatory Visit | Attending: Allergy and Immunology | Admitting: Allergy and Immunology

## 2016-10-21 DIAGNOSIS — R062 Wheezing: Secondary | ICD-10-CM

## 2016-10-21 DIAGNOSIS — R059 Cough, unspecified: Secondary | ICD-10-CM

## 2016-10-21 DIAGNOSIS — R05 Cough: Secondary | ICD-10-CM

## 2016-11-17 DIAGNOSIS — Z79899 Other long term (current) drug therapy: Secondary | ICD-10-CM | POA: Diagnosis not present

## 2016-11-17 DIAGNOSIS — M4712 Other spondylosis with myelopathy, cervical region: Secondary | ICD-10-CM | POA: Diagnosis not present

## 2016-11-17 DIAGNOSIS — M542 Cervicalgia: Secondary | ICD-10-CM | POA: Diagnosis not present

## 2016-11-17 DIAGNOSIS — Z79891 Long term (current) use of opiate analgesic: Secondary | ICD-10-CM | POA: Diagnosis not present

## 2016-11-17 DIAGNOSIS — G894 Chronic pain syndrome: Secondary | ICD-10-CM | POA: Diagnosis not present

## 2016-11-24 DIAGNOSIS — J3089 Other allergic rhinitis: Secondary | ICD-10-CM | POA: Diagnosis not present

## 2016-11-24 DIAGNOSIS — R070 Pain in throat: Secondary | ICD-10-CM | POA: Diagnosis not present

## 2016-11-24 DIAGNOSIS — R062 Wheezing: Secondary | ICD-10-CM | POA: Diagnosis not present

## 2016-11-24 DIAGNOSIS — K219 Gastro-esophageal reflux disease without esophagitis: Secondary | ICD-10-CM | POA: Diagnosis not present

## 2016-12-07 DIAGNOSIS — M25512 Pain in left shoulder: Secondary | ICD-10-CM | POA: Diagnosis not present

## 2016-12-07 DIAGNOSIS — M19012 Primary osteoarthritis, left shoulder: Secondary | ICD-10-CM | POA: Diagnosis not present

## 2016-12-07 DIAGNOSIS — G8929 Other chronic pain: Secondary | ICD-10-CM | POA: Diagnosis not present

## 2016-12-27 DIAGNOSIS — M542 Cervicalgia: Secondary | ICD-10-CM | POA: Diagnosis not present

## 2016-12-27 DIAGNOSIS — M47812 Spondylosis without myelopathy or radiculopathy, cervical region: Secondary | ICD-10-CM | POA: Diagnosis not present

## 2017-01-09 DIAGNOSIS — M542 Cervicalgia: Secondary | ICD-10-CM | POA: Diagnosis not present

## 2017-01-09 DIAGNOSIS — M25512 Pain in left shoulder: Secondary | ICD-10-CM | POA: Diagnosis not present

## 2017-01-12 DIAGNOSIS — M9901 Segmental and somatic dysfunction of cervical region: Secondary | ICD-10-CM | POA: Diagnosis not present

## 2017-01-12 DIAGNOSIS — M5030 Other cervical disc degeneration, unspecified cervical region: Secondary | ICD-10-CM | POA: Diagnosis not present

## 2017-01-12 DIAGNOSIS — M542 Cervicalgia: Secondary | ICD-10-CM | POA: Diagnosis not present

## 2017-01-16 DIAGNOSIS — M5030 Other cervical disc degeneration, unspecified cervical region: Secondary | ICD-10-CM | POA: Diagnosis not present

## 2017-01-16 DIAGNOSIS — M9901 Segmental and somatic dysfunction of cervical region: Secondary | ICD-10-CM | POA: Diagnosis not present

## 2017-01-16 DIAGNOSIS — M542 Cervicalgia: Secondary | ICD-10-CM | POA: Diagnosis not present

## 2017-01-18 DIAGNOSIS — M542 Cervicalgia: Secondary | ICD-10-CM | POA: Diagnosis not present

## 2017-01-18 DIAGNOSIS — G894 Chronic pain syndrome: Secondary | ICD-10-CM | POA: Diagnosis not present

## 2017-01-18 DIAGNOSIS — M4712 Other spondylosis with myelopathy, cervical region: Secondary | ICD-10-CM | POA: Diagnosis not present

## 2017-01-18 DIAGNOSIS — M5106 Intervertebral disc disorders with myelopathy, lumbar region: Secondary | ICD-10-CM | POA: Diagnosis not present

## 2017-03-07 DIAGNOSIS — M542 Cervicalgia: Secondary | ICD-10-CM | POA: Diagnosis not present

## 2017-03-07 DIAGNOSIS — R030 Elevated blood-pressure reading, without diagnosis of hypertension: Secondary | ICD-10-CM | POA: Diagnosis not present

## 2017-03-07 DIAGNOSIS — M47812 Spondylosis without myelopathy or radiculopathy, cervical region: Secondary | ICD-10-CM | POA: Diagnosis not present

## 2017-03-20 DIAGNOSIS — F3342 Major depressive disorder, recurrent, in full remission: Secondary | ICD-10-CM | POA: Diagnosis not present

## 2017-03-21 ENCOUNTER — Ambulatory Visit (INDEPENDENT_AMBULATORY_CARE_PROVIDER_SITE_OTHER): Payer: PPO | Admitting: Family Medicine

## 2017-03-21 ENCOUNTER — Encounter: Payer: Self-pay | Admitting: Family Medicine

## 2017-03-21 VITALS — BP 160/90 | HR 80 | Ht 62.0 in | Wt 128.0 lb

## 2017-03-21 DIAGNOSIS — M542 Cervicalgia: Secondary | ICD-10-CM

## 2017-03-21 DIAGNOSIS — G8929 Other chronic pain: Secondary | ICD-10-CM

## 2017-03-21 DIAGNOSIS — R030 Elevated blood-pressure reading, without diagnosis of hypertension: Secondary | ICD-10-CM

## 2017-03-21 MED ORDER — METHOCARBAMOL 500 MG PO TABS: 500.0000 mg | ORAL_TABLET | Freq: Four times a day (QID) | ORAL | 0 refills | Status: DC | PRN

## 2017-03-21 NOTE — Patient Instructions (Addendum)
We are going to try a muscle relaxer to see if this helps with your pain (since massages do).  If you get zero benefit, you don't need to continue to use it.  Use it just as needed for pain over the muscle in your upper shoulder/neck. You can use this together with the patches, and pain medications and Cymbalta. Use it only if needed.  Continue the cymbalta--taking it at night is a good idea.  Work with the pain doctors--you likely will need to have this dose gradually increased.  Keep a list of blood pressures, and record your pain score.  Check it sometimes after you have taken your pain medication and don't have pain.  I'd like to see how low it is, when pain is not a factor.  This would limit the use of blood pressure medications, as your blood pressure would get too low when you take pain medications.  Bring the blood pressure log with you to your visit. Contact us sooner if having frequent headaches, chest pain or other heart-related problems.   Low-Sodium Eating Plan Sodium, which is an element that makes up salt, helps you maintain a healthy balance of fluids in your body. Too much sodium can increase your blood pressure and cause fluid and waste to be held in your body. Your health care provider or dietitian may recommend following this plan if you have high blood pressure (hypertension), kidney disease, liver disease, or heart failure. Eating less sodium can help lower your blood pressure, reduce swelling, and protect your heart, liver, and kidneys. What are tips for following this plan? General guidelines  Most people on this plan should limit their sodium intake to 1,500-2,000 mg (milligrams) of sodium each day. Reading food labels  The Nutrition Facts label lists the amount of sodium in one serving of the food. If you eat more than one serving, you must multiply the listed amount of sodium by the number of servings.  Choose foods with less than 140 mg of sodium per  serving.  Avoid foods with 300 mg of sodium or more per serving. Shopping  Look for lower-sodium products, often labeled as "low-sodium" or "no salt added."  Always check the sodium content even if foods are labeled as "unsalted" or "no salt added".  Buy fresh foods. ? Avoid canned foods and premade or frozen meals. ? Avoid canned, cured, or processed meats  Buy breads that have less than 80 mg of sodium per slice. Cooking  Eat more home-cooked food and less restaurant, buffet, and fast food.  Avoid adding salt when cooking. Use salt-free seasonings or herbs instead of table salt or sea salt. Check with your health care provider or pharmacist before using salt substitutes.  Cook with plant-based oils, such as canola, sunflower, or olive oil. Meal planning  When eating at a restaurant, ask that your food be prepared with less salt or no salt, if possible.  Avoid foods that contain MSG (monosodium glutamate). MSG is sometimes added to [place] food, bouillon, and some canned foods. What foods are recommended? The items listed may not be a complete list. Talk with your dietitian about what dietary choices are best for you. Grains Low-sodium cereals, including oats, puffed wheat and rice, and shredded wheat. Low-sodium crackers. Unsalted rice. Unsalted pasta. Low-sodium bread. Whole-grain breads and whole-grain pasta. Vegetables Fresh or frozen vegetables. "No salt added" canned vegetables. "No salt added" tomato sauce and paste. Low-sodium or reduced-sodium tomato and vegetable juice. Fruits Fresh, frozen, or canned  fruit. Fruit juice. Meats and other protein foods Fresh or frozen (no salt added) meat, poultry, seafood, and fish. Low-sodium canned tuna and salmon. Unsalted nuts. Dried peas, beans, and lentils without added salt. Unsalted canned beans. Eggs. Unsalted nut butters. Dairy Milk. Soy milk. Cheese that is naturally low in sodium, such as ricotta cheese, fresh mozzarella, or  Swiss cheese Low-sodium or reduced-sodium cheese. Cream cheese. Yogurt. Fats and oils Unsalted butter. Unsalted margarine with no trans fat. Vegetable oils such as canola or olive oils. Seasonings and other foods Fresh and dried herbs and spices. Salt-free seasonings. Low-sodium mustard and ketchup. Sodium-free salad dressing. Sodium-free light mayonnaise. Fresh or refrigerated horseradish. Lemon juice. Vinegar. Homemade, reduced-sodium, or low-sodium soups. Unsalted popcorn and pretzels. Low-salt or salt-free chips. What foods are not recommended? The items listed may not be a complete list. Talk with your dietitian about what dietary choices are best for you. Grains Instant hot cereals. Bread stuffing, pancake, and biscuit mixes. Croutons. Seasoned rice or pasta mixes. Noodle soup cups. Boxed or frozen macaroni and cheese. Regular salted crackers. Self-rising flour. Vegetables Sauerkraut, pickled vegetables, and relishes. Olives. [place] fries. Onion rings. Regular canned vegetables (not low-sodium or reduced-sodium). Regular canned tomato sauce and paste (not low-sodium or reduced-sodium). Regular tomato and vegetable juice (not low-sodium or reduced-sodium). Frozen vegetables in sauces. Meats and other protein foods Meat or fish that is salted, canned, smoked, spiced, or pickled. Bacon, ham, sausage, hotdogs, corned beef, chipped beef, packaged lunch meats, salt pork, jerky, pickled herring, anchovies, regular canned tuna, sardines, salted nuts. Dairy Processed cheese and cheese spreads. Cheese curds. Blue cheese. Feta cheese. String cheese. Regular cottage cheese. Buttermilk. Canned milk. Fats and oils Salted butter. Regular margarine. Ghee. Bacon fat. Seasonings and other foods Onion salt, garlic salt, seasoned salt, table salt, and sea salt. Canned and packaged gravies. Worcestershire sauce. Tartar sauce. Barbecue sauce. Teriyaki sauce. Soy sauce, including reduced-sodium. Steak sauce. Fish  sauce. Oyster sauce. Cocktail sauce. Horseradish that you find on the shelf. Regular ketchup and mustard. Meat flavorings and tenderizers. Bouillon cubes. Hot sauce and Tabasco sauce. Premade or packaged marinades. Premade or packaged taco seasonings. Relishes. Regular salad dressings. Salsa. Potato and tortilla chips. Corn chips and puffs. Salted popcorn and pretzels. Canned or dried soups. Pizza. Frozen entrees and pot pies. Summary  Eating less sodium can help lower your blood pressure, reduce swelling, and protect your heart, liver, and kidneys.  Most people on this plan should limit their sodium intake to 1,500-2,000 mg (milligrams) of sodium each day.  Canned, boxed, and frozen foods are high in sodium. Restaurant foods, fast foods, and pizza are also very high in sodium. You also get sodium by adding salt to food.  Try to cook at home, eat more fresh fruits and vegetables, and eat less fast food, canned, processed, or prepared foods. This information is not intended to replace advice given to you by your health care provider. Make sure you discuss any questions you have with your health care provider. Document Released: 08/06/2001 Document Revised: 02/08/2016 Document Reviewed: 02/08/2016 Elsevier Interactive Patient Education  [employer].

## 2017-03-21 NOTE — Progress Notes (Signed)
Chief Complaint  Patient presents with  . Hypertension    has been having high blood pressure readings at home. Took one time at home, one time at pain doctor, one time at church and at her psych yesterday. Taking cymbalta and hasn' t been feeling great since starting about 3 weeks ago.     She has been monitoring her BP at home over the past couple of weeks, running 160/90's. It was noted to be high when at her psychiatrist yesterday.  She has chronic pain, which has been worse recently, is worse in colder weather. She sees Dr. Lovenia Shuck for pain management.  She reports she was put on Celebrex before Christmas, didn't take it long, didn't get much benefit.  He then started her on Cymbalta--which caused increased fatigue.  Started it 2.5 weeks ago. Denies nausea.  Switched to taking it at bedtime just last night, upon recommendation from psych.See Dr. Maryjean Ka again the end of the month.  She tries to limit her use of hydrocodone, only takes 1/2 tablet every other day, max 1/day. Pain is currently 7/10.  She reports zero pain when she takes the pain medication.  Pain is in her neck, radiating to the left shoulder. Denies numbness, tingling.  Pain is throbbing. SalonPas patches help some, as does getting massages. She cannot recall which muscle relaxants, if any, she may have tried.   Prevously had injections from Dr Nelva Bush x 3, no benefit and was painful.  Also had dry needling x 2, didn't get much benefit (also was very painful).  Review of chart shows allergy to cymbalta, described as abdominal pain in 2015. She denies having any GI issues currently, on the 20mg  dose.  Review of chart shows fluctuating BP's, many normal.   BP Readings from Last 3 Encounters:  03/21/17 (!) 160/90  07/06/16 128/76  06/15/16 (!) 142/84   Denies high sodium foods or any change in diet.  Denies headaches, chest pain.   PMH, PSH, SH reviewed  Outpatient Encounter Medications as of 03/21/2017  Medication Sig Note  .  Calcium Carbonate-Vit D-Min (CALCIUM 1200 PO) Take 1,200 mg by mouth daily.   . cholecalciferol (VITAMIN D) 1000 UNITS tablet Take 2,000 Units by mouth daily.    . Cyanocobalamin (B-12) 5000 MCG CAPS Take by mouth daily.   . DULoxetine (CYMBALTA) 20 MG capsule Take by mouth daily.   Marland Kitchen HYDROcodone-acetaminophen (NORCO) 10-325 MG tablet Take 0.5 tablets by mouth as needed. 03/21/2017: Takes 1/2 tablet once daily if needed, sometimes 3x/week  . lamoTRIgine (LAMICTAL) 150 MG tablet Take 150 mg by mouth at bedtime.     . Multiple Vitamin (MULTIVITAMIN) tablet Take 1 tablet by mouth daily.   Marland Kitchen zolpidem (AMBIEN) 10 MG tablet Take 2.5 mg by mouth at bedtime as needed.  07/06/2015: Takes 1/4th tablet every night (from psych)  . azithromycin (ZITHROMAX) 250 MG tablet Take as directed by manufacture (Patient not taking: Reported on 03/21/2017) 03/21/2017: Has rx for travel, never needed to take it  . methocarbamol (ROBAXIN) 500 MG tablet Take 1-2 tablets (500-1,000 mg total) by mouth every 6 (six) hours as needed for muscle spasms.   Marland Kitchen PROAIR HFA 108 (90 Base) MCG/ACT inhaler Inhale 2 puffs into the lungs every 4 (four) hours as needed for wheezing or shortness of breath. (Patient not taking: Reported on 03/21/2017) 07/06/2016: Using prn, twice daily--hasn't used it much in the last 2 weeks  . [DISCONTINUED] LYRICA 25 MG capsule TAKE ONE CAPSULE AT BEDTIME FOR 1  WEEK, THEN INCREASE TO 2 CAPSULES AT BEDTIME    No facility-administered encounter medications on file as of 03/21/2017.    (robaxin prescribed today, not taking prior to visit)  Allergies  Allergen Reactions  . Cefdinir     REACTION: rash  . Cymbalta [Duloxetine Hcl]     Abdominal pain  . Nitrofurantoin [Macrodantin]   . Sulfamethoxazole Hives   ROS: no fever, chills, URI symptoms, headaches, chest pain, shortness of breath. +fatigue (worse since recently starting cymbalta).  Moods are good. Denies nausea, vomiting, abdominal pain, diarrhea or  constipation. Denies bleeding, bruising, rashes. Denies numbness, tingling or GU complaints   PHYSICAL EXAM:  BP (!) 160/90 (BP Location: Right Arm, Cuff Size: Normal)   Pulse 80   Ht 5\' 2"  (1.575 m)   Wt 128 lb (58.1 kg)   LMP 08/15/1988 (Exact Date)   BMI 23.41 kg/m   170/80 on repeat by MD  Well appearing, pleasant female, in no apparent distress HEENT: PERRL, EOMI, conjunctiva and sclera are clear Neck: no lymphadenopathy, thyromegaly or mass. Spine nontender. WHSS left posterior neck Tender at trapezius muscle, L>R, firmer on right.  No other msucle tenderness or spasm noted. No CVA tenderness or spinal tenderness Heart: regular rate and rhythm, no murmur Lungs: clear bilaterally Abdomen: soft, nontender Extremities: no edema Psych: normal mood, affect, hygiene and grooming Skin: normal turgor, no rash Neuro: alert and oriented, cranial nerves intact, normal gait  ASSESSMENT/PLAN:  Neck pain - chronic, under care of pain management, actively seeing/adjusting meds. Trial of muscle relaxants in addition to cymbalta, heat, massage, patches - Plan: methocarbamol (ROBAXIN) 500 MG tablet  Elevated blood pressure reading - suspect related to pain, which resolves with hydrocodone; continue to monitor (and record pain score), low sodium diet. Risk for hypotension when takes pain med  Other chronic pain - contributing to elevated blood pressure, normal in past when pain was better controlled; discussed risks hypotension if BP meds started and pain improves.   25 min visit, more than 1/2 spent counseling re: medications, risks/side effects, blood pressure and various factors which affect it, risk of hypotension if BP med started to control BP related to pain, when she takes pain med and pain resolves.  All questions answered. Continue cymbalta in the evening. Trial of muscle relaxant, since massages are helpful and likely has muscular component. F/u as scheduled with pain  management. Monitor BP and record, bring list to f/u in 4 weeks.   We are going to try a muscle relaxer to see if this helps with your pain (since massages do).  If you get zero benefit, you don't need to continue to use it.  Use it just as needed for pain over the muscle in your upper shoulder/neck. You can use this together with the patches, and pain medications and Cymbalta. Use it only if needed.   Keep a list of blood pressures, and record your pain score.  Check it sometimes after you have taken your pain medication and don't have pain.  I'd like to see how low it is, when pain is not a factor.  This would limit the use of blood pressure medications, as your blood pressure would get too low when you take pain medications.

## 2017-03-31 DIAGNOSIS — H5203 Hypermetropia, bilateral: Secondary | ICD-10-CM | POA: Diagnosis not present

## 2017-03-31 DIAGNOSIS — H1789 Other corneal scars and opacities: Secondary | ICD-10-CM | POA: Diagnosis not present

## 2017-03-31 DIAGNOSIS — H25813 Combined forms of age-related cataract, bilateral: Secondary | ICD-10-CM | POA: Diagnosis not present

## 2017-04-12 DIAGNOSIS — M19012 Primary osteoarthritis, left shoulder: Secondary | ICD-10-CM | POA: Diagnosis not present

## 2017-04-19 NOTE — Progress Notes (Signed)
Chief Complaint  Patient presents with  . Follow-up    had cortisone shot on left side of shoulder that has not helped. Last 3 days she thinks she has a pinched nerve in her neck. Muscle relaxants have helped. Blood pressure readings have been all over the place.     Patient presents for 4 week follow-up on her blood pressures.  They were noted to be high, possibly related to pain.  She was asked to keep track, along with her pain score.  We gave her robaxin muscle relaxant to see if that helped with some of her neck pain (some muscular component, given that massages were helpful), in addition to her other medications including cymbalta.  She brings in list of blood pressures (but NO documented pain scores). Pain has been worse in the last 2 months, now also has a pinched nerve on the left side, and has pain going down to her fingers. She reports checking BP when not in pain after a pain pill.  Not recorded on sheet, but there are no blood pressure that are particularly low noted.  Range from 132-163/70-99.  Mostly 150's/90.  1.5-2 weeks ago she had a cortisone shot but it only helped for 1 day (Dr. Onnie Graham). She states he thinks she needs a shoulder replacement.  Today she is having symptoms all the way down her left arm, left hand feels cold.  Arm is "zinging". The robaxin has been helping with the pinched nerve feeling she has going on today, but hasn't heped with her general neck (chronic) pain.  She can only take 1/2 tablet at a time, as it makes her sleepy with the full pill.   Patient brought in monitor to verify the accuracy.  BP per nurse 144/90; pt monitor 156/94 (higher than nurses, but similar to mine when rechecked later in visit).  Celebrex made her feel sick to her stomach, so stopped by Celebrex, switched to Cymbalta, which she took for a month (was taking when she was here last).  She states she didn't like how she felt on it. "I felt so crazy".  It was prescribed by pain doctor,  Dr. Maryjean Ka. She last saw the PA, didn't have a good interaction and doesn't want to go back there. She continues on her lamictal per psych.   She has previously taken steroids for bronchial infections/sinuses, "a little wild at night", but has ambien to help with the insomnia.  PMH, PSH, SH reviewed  Outpatient Encounter Medications as of 04/20/2017  Medication Sig Note  . Calcium Carbonate-Vit D-Min (CALCIUM 1200 PO) Take 1,200 mg by mouth daily.   . cholecalciferol (VITAMIN D) 1000 UNITS tablet Take 2,000 Units by mouth daily.    . Cyanocobalamin (B-12) 5000 MCG CAPS Take by mouth daily.   Marland Kitchen HYDROcodone-acetaminophen (NORCO) 10-325 MG tablet Take 0.5 tablets by mouth as needed. 03/21/2017: Takes 1/2 tablet once daily if needed, sometimes 3x/week  . lamoTRIgine (LAMICTAL) 150 MG tablet Take 150 mg by mouth at bedtime.     . methocarbamol (ROBAXIN) 500 MG tablet Take 1-2 tablets (500-1,000 mg total) by mouth every 6 (six) hours as needed for muscle spasms.   . Multiple Vitamin (MULTIVITAMIN) tablet Take 1 tablet by mouth daily.   Marland Kitchen zolpidem (AMBIEN) 10 MG tablet Take 2.5 mg by mouth at bedtime as needed.  07/06/2015: Takes 1/4th tablet every night (from psych)  . azithromycin (ZITHROMAX) 250 MG tablet Take as directed by manufacture (Patient not taking: Reported on 03/21/2017) 03/21/2017:  Has rx for travel, never needed to take it  . DULoxetine (CYMBALTA) 20 MG capsule Take by mouth daily. 04/20/2017: Stopped taking it "I couldn't think", didn't feel well taking it  . PROAIR HFA 108 (90 Base) MCG/ACT inhaler Inhale 2 puffs into the lungs every 4 (four) hours as needed for wheezing or shortness of breath. (Patient not taking: Reported on 03/21/2017) 07/06/2016: Using prn, twice daily--hasn't used it much in the last 2 weeks   No facility-administered encounter medications on file as of 04/20/2017.    Allergies  Allergen Reactions  . Cefdinir     REACTION: rash  . Cymbalta [Duloxetine Hcl]      Abdominal pain  . Nitrofurantoin [Macrodantin]   . Sulfamethoxazole Hives   ROS: no fever, chills, URI symptoms.  +neck pain, with pain/tingling radiating into the left arm and hand, hand feels cold.  No weakness. No chest pain, palpitations, GI or GU complaints.  No other neuro complaints  PHYSICAL EXAM:  BP (!) 144/90   Pulse 80   Ht 5\' 2"  (1.575 m)   Wt 127 lb 9.6 oz (57.9 kg)   LMP 08/15/1988 (Exact Date)   BMI 23.34 kg/m   154/94 on repeat by MD Pain 8/10 currently  Well appearing female. HEENT: conjunctiva and sclera are clear, EOMI Neck: no spinal tenderness. Tender at left trapezius, pretty widespread, but some focal trigger points that were more tender No lymphadenopathy or mass Heart: regular rate and rhythm Lungs: clear bilaterally Neuro: alert and oriented. Normal strength, some subjective change in sensation, intact to light touch.  Feels "zinging" in all 5 fingertips. DTR's symmetric Normal pulses, brisk cap refill.  Hands are warm Psych: normal mood, affect, though seems somewhat frustrated/hopeless and anxious regarding medications.  Normal hygiene, grooming, eye contact and speech  ASSESSMENT/PLAN:  Cervical radiculopathy - new/acute; treat with steroids--risks/SE reviewed. Cont robaxin prn for muscle spasm - Plan: methylPREDNISolone (MEDROL DOSEPAK) 4 MG TBPK tablet  Elevated blood pressure reading - seems constant now; reviewed risks from HTN, suggested low dose medication--she declines (consider BB, due to mild tremor, has h/o asthma)  Other chronic pain - discussed f/u with Dr. Maryjean Ka (not PA)--only going to hurt her to not go back; rec d/w office mgr last interaction if a problem  Neck pain   Declines bp meds--discussed in detail (and recommended at this point, given list of all elevated BP's at this visit. Consider beta blocker (but caution re: asthma, depression), as she is concerned about mild tremor, and has in family.  F/u as scheduled in May.  To bring list of BP's, and to include comments/pain scores.  25 min visit, more than 1/2 spent counseling   You declined starting blood pressure medication today.  I do think that pain is a factor. However, your blood pressure at home was consistently high. Please continue to monitor--hopefully if your pain improves after the course of steroids, maybe your pressures will come down some. If they don't, and they remain >140/90, then please return to discuss the blood pressure medication. I recommend returning to the pain doctor (Dr. Maryjean Ka, not necessarily the PA if your interaction wasn't good) for ongoing pain control, if needed.  The risks of continuous high blood pressure (even if pain is the reason) is stiffness and thickening of the heart, congestive heart failure and risks for blockages/heart disease.

## 2017-04-20 ENCOUNTER — Ambulatory Visit: Payer: PPO | Admitting: Family Medicine

## 2017-04-20 ENCOUNTER — Encounter: Payer: Self-pay | Admitting: Family Medicine

## 2017-04-20 ENCOUNTER — Ambulatory Visit (INDEPENDENT_AMBULATORY_CARE_PROVIDER_SITE_OTHER): Payer: PPO | Admitting: Family Medicine

## 2017-04-20 VITALS — BP 144/90 | HR 80 | Ht 62.0 in | Wt 127.6 lb

## 2017-04-20 DIAGNOSIS — G8929 Other chronic pain: Secondary | ICD-10-CM | POA: Diagnosis not present

## 2017-04-20 DIAGNOSIS — M5412 Radiculopathy, cervical region: Secondary | ICD-10-CM

## 2017-04-20 DIAGNOSIS — M542 Cervicalgia: Secondary | ICD-10-CM | POA: Diagnosis not present

## 2017-04-20 DIAGNOSIS — R030 Elevated blood-pressure reading, without diagnosis of hypertension: Secondary | ICD-10-CM

## 2017-04-20 MED ORDER — METHYLPREDNISOLONE 4 MG PO TBPK: ORAL_TABLET | ORAL | 0 refills | Status: DC

## 2017-04-20 NOTE — Patient Instructions (Signed)
  You declined starting blood pressure medication today.  I do think that pain is a factor. However, your blood pressure at home was consistently high. Please continue to monitor--hopefully if your pain improves after the course of steroids, maybe your pressures will come down some. If they don't, and they remain >140/90, then please return to discuss the blood pressure medication. I recommend returning to the pain doctor (Dr. Maryjean Ka, not necessarily the PA if your interaction wasn't good) for ongoing pain control, if needed.  The risks of continuous high blood pressure (even if pain is the reason) is stiffness and thickening of the heart, congestive heart failure and risks for blockages/heart disease.

## 2017-05-29 ENCOUNTER — Encounter: Payer: Self-pay | Admitting: Podiatry

## 2017-05-29 ENCOUNTER — Ambulatory Visit (INDEPENDENT_AMBULATORY_CARE_PROVIDER_SITE_OTHER): Payer: PPO | Admitting: Podiatry

## 2017-05-29 DIAGNOSIS — D3610 Benign neoplasm of peripheral nerves and autonomic nervous system, unspecified: Secondary | ICD-10-CM

## 2017-05-30 NOTE — Progress Notes (Signed)
Subjective:   Patient ID: [name], female   DOB: 72 y.o.   MRN: [id number]   HPI Patient presents stating that the nerves have really started to become active again right over left but they are both sore and states that ultimately she may require surgery on the right but she wants to continue to try conservative care   ROS      Objective:  Physical Exam  Neurovascular status intact with positive Biagio Borg sign noted bilateral right over left shooting pains between the third and fourth toes     Assessment:  Neuroma symptomatology bilateral right over left     Plan:  H&P condition reviewed and I did do sterile injections of a dehydrated alcohol Marcaine solution which was tolerated well for neural lysis procedure.  May ultimately require surgical excision

## 2017-06-07 DIAGNOSIS — J3089 Other allergic rhinitis: Secondary | ICD-10-CM | POA: Diagnosis not present

## 2017-06-07 DIAGNOSIS — R070 Pain in throat: Secondary | ICD-10-CM | POA: Diagnosis not present

## 2017-06-07 DIAGNOSIS — R062 Wheezing: Secondary | ICD-10-CM | POA: Diagnosis not present

## 2017-06-07 DIAGNOSIS — K219 Gastro-esophageal reflux disease without esophagitis: Secondary | ICD-10-CM | POA: Diagnosis not present

## 2017-06-08 ENCOUNTER — Ambulatory Visit (INDEPENDENT_AMBULATORY_CARE_PROVIDER_SITE_OTHER): Payer: PPO | Admitting: Podiatry

## 2017-06-08 ENCOUNTER — Encounter: Payer: Self-pay | Admitting: Podiatry

## 2017-06-08 DIAGNOSIS — D3610 Benign neoplasm of peripheral nerves and autonomic nervous system, unspecified: Secondary | ICD-10-CM | POA: Diagnosis not present

## 2017-06-09 NOTE — Progress Notes (Signed)
Subjective:   Patient ID: [name], female   DOB: 72 y.o.   MRN: [id number]   HPI Patient presents stating the nerves are still bothering her and her son is getting married next week   ROS      Objective:  Physical Exam  Neurovascular status intact with continued discomfort third interspace of both feet with radiating discomfort     Assessment:  Neuroma symptomatology bilateral which in the end may require correction if it does not get better with injections     Plan:  Sterile prep to each foot and I then did injections in the third interspace with a purified alcohol Marcaine solution 1.5 cc into each third interspace.  Tolerated well applied sterile dressing and reappoint as needed

## 2017-06-14 ENCOUNTER — Other Ambulatory Visit: Payer: Self-pay | Admitting: Family Medicine

## 2017-06-14 DIAGNOSIS — Z139 Encounter for screening, unspecified: Secondary | ICD-10-CM

## 2017-06-19 ENCOUNTER — Ambulatory Visit: Payer: PPO | Admitting: Nurse Practitioner

## 2017-06-19 ENCOUNTER — Ambulatory Visit: Payer: PPO | Admitting: Obstetrics and Gynecology

## 2017-06-22 ENCOUNTER — Ambulatory Visit: Payer: PPO | Admitting: Obstetrics and Gynecology

## 2017-06-27 ENCOUNTER — Telehealth: Payer: Self-pay | Admitting: *Deleted

## 2017-06-28 ENCOUNTER — Other Ambulatory Visit: Payer: Self-pay

## 2017-06-28 ENCOUNTER — Ambulatory Visit (INDEPENDENT_AMBULATORY_CARE_PROVIDER_SITE_OTHER): Payer: PPO | Admitting: Obstetrics and Gynecology

## 2017-06-28 ENCOUNTER — Encounter: Payer: Self-pay | Admitting: Obstetrics and Gynecology

## 2017-06-28 VITALS — BP 138/80 | HR 72 | Resp 16 | Ht 61.5 in | Wt 125.0 lb

## 2017-06-28 DIAGNOSIS — Z01419 Encounter for gynecological examination (general) (routine) without abnormal findings: Secondary | ICD-10-CM

## 2017-06-28 DIAGNOSIS — M8580 Other specified disorders of bone density and structure, unspecified site: Secondary | ICD-10-CM | POA: Diagnosis not present

## 2017-06-28 DIAGNOSIS — N816 Rectocele: Secondary | ICD-10-CM

## 2017-06-28 NOTE — Patient Instructions (Signed)
EXERCISE AND DIET:  We recommended that you start or continue a regular exercise program for good health. Regular exercise means any activity that makes your heart beat faster and makes you sweat.  We recommend exercising at least 30 minutes per day at least 3 days a week, preferably 4 or 5.  We also recommend a diet low in fat and sugar.  Inactivity, poor dietary choices and obesity can cause diabetes, heart attack, stroke, and kidney damage, among others.    ALCOHOL AND SMOKING:  Women should limit their alcohol intake to no more than 7 drinks/beers/glasses of wine (combined, not each!) per week. Moderation of alcohol intake to this level decreases your risk of breast cancer and liver damage. And of course, no recreational drugs are part of a healthy lifestyle.  And absolutely no smoking or even second hand smoke. Most people know smoking can cause heart and lung diseases, but did you know it also contributes to weakening of your bones? Aging of your skin?  Yellowing of your teeth and nails?  CALCIUM AND VITAMIN D:  Adequate intake of calcium and Vitamin D are recommended.  The recommendations for exact amounts of these supplements seem to change often, but generally speaking 600 mg of calcium (either carbonate or citrate) and 800 units of Vitamin D per day seems prudent. Certain women may benefit from higher intake of Vitamin D.  If you are among these women, your doctor will have told you during your visit.    PAP SMEARS:  Pap smears, to check for cervical cancer or precancers,  have traditionally been done yearly, although recent scientific advances have shown that most women can have pap smears less often.  However, every woman still should have a physical exam from her gynecologist every year. It will include a breast check, inspection of the vulva and vagina to check for abnormal growths or skin changes, a visual exam of the cervix, and then an exam to evaluate the size and shape of the uterus and  ovaries.  And after 74 years of age, a rectal exam is indicated to check for rectal cancers. We will also provide age appropriate advice regarding health maintenance, like when you should have certain vaccines, screening for sexually transmitted diseases, bone density testing, colonoscopy, mammograms, etc.   MAMMOGRAMS:  All women over 40 years old should have a yearly mammogram. Many facilities now offer a "3D" mammogram, which may cost around $50 extra out of pocket. If possible,  we recommend you accept the option to have the 3D mammogram performed.  It both reduces the number of women who will be called back for extra views which then turn out to be normal, and it is better than the routine mammogram at detecting truly abnormal areas.    COLONOSCOPY:  Colonoscopy to screen for colon cancer is recommended for all women at age 50.  We know, you hate the idea of the prep.  We agree, BUT, having colon cancer and not knowing it is worse!!  Colon cancer so often starts as a polyp that can be seen and removed at colonscopy, which can quite literally save your life!  And if your first colonoscopy is normal and you have no family history of colon cancer, most women don't have to have it again for 10 years.  Once every ten years, you can do something that may end up saving your life, right?  We will be happy to help you get it scheduled when you are ready.    Be sure to check your insurance coverage so you understand how much it will cost.  It may be covered as a preventative service at no cost, but you should check your particular policy.   ? ?About Rectocele ? ?Overview ? ?A rectocele is a type of hernia which causes different degrees of bulging of the rectal tissues into the vaginal wall.  You may even notice that it presses against the vaginal wall so much that some vaginal tissues droop outside of the opening of your vagina. ? ?Causes of Rectocele ? ?The most common cause is childbirth.  The muscles and ligaments  in the pelvis that hold up and support the female organs and vagina become stretched and weakened during labor and delivery.  The more babies you have, the more the support tissues are stretched and weakened.  Not everyone who has a baby will develop a rectocele.  Some women have stronger supporting tissue in the pelvis and may not have as much of a problem as others.  Women who have a Cesarean section usually do not get rectocele's unless they pushed a long time prior to the cesarean delivery. ? ?Other conditions that can cause a rectocele include chronic constipation, a chronic cough, a lot of heavy lifting, and obesity.  Older women may have this problem because the loss of female hormones causes the vaginal tissue to become weaker. ? ?Symptoms ? ?There may not be any symptoms.  If you do have symptoms, they may include: ?Pelvic pressure in the rectal area ?Protrusion of the lower part of the vagina through the opening of the vagina ?Constipation and trapping of the stool, making it difficult to have a bowel movement.  In severe cases, you may have to press on the lower part of your vagina to help push the stool out of you rectum.  This is called splinting to empty. ? ?Diagnosing Rectocele ? ?Your health care provider will ask about your symptoms and perform a pelvic exam.  S/he will ask you to bear down, pushing like you are having a bowel movement so as to see how far the lower part of the vagina protrudes into the vagina and possible outside of the vagina.  Your provider will also ask you to contract the muscles of your pelvis (like you are stopping the stream in the middle of urinating) to determine the strength of your pelvic muscles.  Your provider may also do a rectal exam. ? ?Treatment Options ? ?If you do not have any symptoms, no treatment may be necessary.  Other treatment options include: ?Pelvic floor exercises: Contracting the muscles in your genital area may help strengthen your muscles and support  the organs.  Be sure to get proper exercise instruction from you physical therapist. ?A pessary (removealbe pelvic support device) sometimes helps rectocele symptoms. ?Surgery: Surgical repair may be necessary. In some cases the uterus may need to be taken out ( a hysterectomy) as well.  There are many types of surgery for pelvic support problems.  Look for physicians who specialize in repair procedures. ? ?You can take care of yourself by: ?Treating and preventing constipation ?Avoiding heavy lifting, and lifting correctly (with your legs, not with you waist or back) ?Treating a chronic cough or bronchitis ?Not smoking ?avoiding too much weight gain ?Doing pelvic floor exercises ? ?? 2007, [employer] Doc.33 ?

## 2017-06-28 NOTE — Progress Notes (Signed)
72 y.o. H2C9470 MarriedCaucasianF here for annual exam.   She has chronic neck pain (fell down the stairs at 15, has had 3 surgeries). Able to function.  No vaginal bleeding. Not sexually active.  She has controlled depression.  She needs cataract surgery this year and foot surgery.     Patient's last menstrual period was 08/15/1988 (exact date).          Sexually active: No.  The current method of family planning is post menopausal status.    Exercising: Yes.    yoga/walking Smoker:  no  Health Maintenance: Pap:  04-15-14 WNL  History of abnormal Pap:  no MMG:  07-22-16 WNL  Colonoscopy:  09-16-14 polyps  BMD:   07-22-15 Osteopenia, FRAX 11.5%/2.4% TDaP:  2009 Gardasil: N/A   reports that she has never smoked. She has never used smokeless tobacco. She reports that she drinks about 4.2 oz of alcohol per week. She reports that she does not use drugs.She has 3 kids, and 4 grandchildren (all teenagers). 16 year old son just got married, would like to have kids.    Past Medical History:  Diagnosis Date  . Anxiety    sees [facility], [place])  . Colon polyps 08/2014   tubular adenoma and hyperplastic polyps (Dr. Hilarie Fredrickson)  . DDD (degenerative disc disease), cervical dr Patrice Paradise   s/p surgery, ongoing pain  . Depression    h/o hospitalization with ECT  . History of insomnia   . Neck pain   . Osteopenia stopped fosamax ~2006  . Post-operative nausea and vomiting   . Vitamin D deficiency borderline(28)-2010   treated by her GYN    Past Surgical History:  Procedure Laterality Date  . BREAST REDUCTION SURGERY  08/2009  . HEMORRHOID SURGERY  2006, 12/2014  . KNEE ARTHROSCOPY Right   . LASIK     Dr. Lucita Ferrara  . NASAL SEPTOPLASTY W/ TURBINOPLASTY  1986  . NECK SURGERY  02/2010([facility])1/03,2005([place])  . REDUCTION MAMMAPLASTY Bilateral 07/24/2008  . TOTAL HIP ARTHROPLASTY Left 08/2007        Current Outpatient Medications  Medication Sig Dispense Refill  . Calcium  Carbonate-Vit D-Min (CALCIUM 1200 PO) Take 1,200 mg by mouth daily.    . cholecalciferol (VITAMIN D) 1000 UNITS tablet Take 2,000 Units by mouth daily.     . Cyanocobalamin (B-12) 5000 MCG CAPS Take by mouth daily.    Marland Kitchen HYDROcodone-acetaminophen (NORCO) 10-325 MG tablet Take 0.5 tablets by mouth as needed.    . lamoTRIgine (LAMICTAL) 150 MG tablet Take 150 mg by mouth at bedtime.      . Multiple Vitamin (MULTIVITAMIN) tablet Take 1 tablet by mouth daily.    Marland Kitchen PROAIR HFA 108 (90 Base) MCG/ACT inhaler Inhale 2 puffs into the lungs every 4 (four) hours as needed for wheezing or shortness of breath. 1 Inhaler 1  . zolpidem (AMBIEN) 10 MG tablet Take 2.5 mg by mouth at bedtime as needed.      No current facility-administered medications for this visit.     Family History  Problem Relation Age of Onset  . Arthritis Mother   . Depression Mother   . Pernicious anemia Mother   . Heart disease Father 76       MI  . Depression Brother   . Diabetes Paternal Uncle   . Breast cancer Cousin        paternal  Brother died last year, he had severe depression, stopped eating.  Review of Systems  Constitutional: Negative.  HENT: Negative.   Eyes: Negative.   Respiratory: Negative.   Cardiovascular: Negative.   Gastrointestinal: Negative.   Endocrine: Negative.   Genitourinary: Negative.   Musculoskeletal: Positive for myalgias.  Skin: Negative.   Allergic/Immunologic: Negative.   Neurological: Negative.   Psychiatric/Behavioral: Negative.     Exam:   BP 138/80 (BP Location: Right Arm, Patient Position: Sitting, Cuff Size: Normal)   Pulse 72   Resp 16   Ht 5' 1.5" (1.562 m)   Wt 125 lb (56.7 kg)   LMP 08/15/1988 (Exact Date)   BMI 23.24 kg/m   Weight change: @WEIGHTCHANGE @ Height:   Height: 5' 1.5" (156.2 cm)  Ht Readings from Last 3 Encounters:  06/28/17 5' 1.5" (1.562 m)  04/20/17 5\' 2"  (1.575 m)  03/21/17 5\' 2"  (1.575 m)    General appearance: alert, cooperative and appears  stated age Head: Normocephalic, without obvious abnormality, atraumatic Neck: no adenopathy, supple, symmetrical, trachea midline and thyroid normal to inspection and palpation Lungs: clear to auscultation bilaterally Cardiovascular: regular rate and rhythm Breasts: normal appearance, no masses or tenderness, evidence of bilateral breast reduction Abdomen: soft, non-tender; non distended,  no masses,  no organomegaly Extremities: extremities normal, atraumatic, no cyanosis or edema Skin: Skin color, texture, turgor normal. No rashes or lesions Lymph nodes: Cervical, supraclavicular, and axillary nodes normal. No abnormal inguinal nodes palpated Neurologic: Grossly normal   Pelvic: External genitalia:  no lesions              Urethra:  normal appearing urethra with no masses, tenderness or lesions              Bartholins and Skenes: normal                 Vagina: atrophic appearing vagina with a small grade 1-2 rectocele with valsalva.               Cervix: no lesions               Bimanual Exam:  Uterus:  normal size, contour, position, consistency, mobility, non-tender              Adnexa: no mass, fullness, tenderness               Rectovaginal: Confirms               Anus:  normal sphincter tone, no lesions  Chaperone was present for exam.  A:  Well Woman with normal exam  Rectocele, not symptomatic  P:   No pap  Mammogram and DEXA due  Colonoscopy in 2016, she will check on recommended f/u  Discussed breast self exam  Discussed calcium and vit D intake  Labs with primary (including vit D)  Discussed rectocele, call with any concerns

## 2017-06-29 ENCOUNTER — Ambulatory Visit: Payer: PPO | Admitting: Podiatry

## 2017-06-29 ENCOUNTER — Ambulatory Visit (INDEPENDENT_AMBULATORY_CARE_PROVIDER_SITE_OTHER): Payer: PPO

## 2017-06-29 ENCOUNTER — Encounter: Payer: Self-pay | Admitting: Podiatry

## 2017-06-29 DIAGNOSIS — D3610 Benign neoplasm of peripheral nerves and autonomic nervous system, unspecified: Secondary | ICD-10-CM

## 2017-06-29 NOTE — Progress Notes (Signed)
Subjective:   Patient ID: [name], female   DOB: 72 y.o.   MRN: [id number]   HPIpatient presents stating she's ready for surgery on this right foot and that it increasingly bothers her and she wants it fixed in [name]   ROS      Objective:  Physical Exam  Neurovascular status intact with patient's right third interspace over the left third interspace showing quite a bit of discomfort with shooting pains and positive Biagio Borg sign     Assessment:  Neuroma symptomatology right over left     Plan:  H&P condition reviewed and recommended excision of nerve. I explained procedure and risk to patient and she reviewed consent form going over alternative treatments and complications. She stands no long-term guarantees and the told can take 6 months andshe signed consent form today after review

## 2017-06-29 NOTE — Patient Instructions (Signed)
Pre-Operative Instructions  Congratulations, you have decided to take an important step towards improving your quality of life.  You can be assured that the doctors and staff at [facility] will be with you every step of the way.  Here are some important things you should know:  1. Plan to be at the surgery center/hospital at least 1 (one) hour prior to your scheduled time, unless otherwise directed by the surgical center/hospital staff.  You must have a responsible adult accompany you, remain during the surgery and drive you home.  Make sure you have directions to the surgical center/hospital to ensure you arrive on time. 2. If you are having surgery at [facility] or [facility] hospitals, you will need a copy of your medical history and physical form from your family physician within one month prior to the date of surgery. We will give you a form for your primary physician to complete.  3. We make every effort to accommodate the date you request for surgery.  However, there are times where surgery dates or times have to be moved.  We will contact you as soon as possible if a change in schedule is required.   4. No aspirin/ibuprofen for one week before surgery.  If you are on aspirin, any non-steroidal anti-inflammatory medications (Mobic, Aleve, Ibuprofen) should not be taken seven (7) days prior to your surgery.  You make take Tylenol for pain prior to surgery.  5. Medications - If you are taking daily heart and blood pressure medications, seizure, reflux, allergy, asthma, anxiety, pain or diabetes medications, make sure you notify the surgery center/hospital before the day of surgery so they can tell you which medications you should take or avoid the day of surgery. 6. No food or drink after midnight the night before surgery unless directed otherwise by surgical center/hospital staff. 7. No alcoholic beverages 24-hours prior to surgery.  No smoking 24-hours prior or 24-hours after  surgery. 8. Wear loose pants or shorts. They should be loose enough to fit over bandages, boots, and casts. 9. Don't wear slip-on shoes. Sneakers are preferred. 10. Bring your boot with you to the surgery center/hospital.  Also bring crutches or a walker if your physician has prescribed it for you.  If you do not have this equipment, it will be provided for you after surgery. 11. If you have not been contacted by the surgery center/hospital by the day before your surgery, call to confirm the date and time of your surgery. 12. Leave-time from work may vary depending on the type of surgery you have.  Appropriate arrangements should be made prior to surgery with your employer. 13. Prescriptions will be provided immediately following surgery by your doctor.  Fill these as soon as possible after surgery and take the medication as directed. Pain medications will not be refilled on weekends and must be approved by the doctor. 14. Remove nail polish on the operative foot and avoid getting pedicures prior to surgery. 15. Wash the night before surgery.  The night before surgery wash the foot and leg well with water and the antibacterial soap provided. Be sure to pay special attention to beneath the toenails and in between the toes.  Wash for at least three (3) minutes. Rinse thoroughly with water and dry well with a towel.  Perform this wash unless told not to do so by your physician.  Enclosed: 1 Ice pack (please put in freezer the night before surgery)   1 Hibiclens skin cleaner     Pre-op instructions  If you have any questions regarding the instructions, please do not hesitate to call our office.  [place]: [address], [place], [place] [address] -- [phone]  [place]: [address] [place], [place] [address] -- [phone]  [place]: [address]  [place], [place] [address] -- [phone]  [place]: [address], [address], [place],  [address] -- [phone]  Website:  [email or web address] 

## 2017-06-30 NOTE — Telephone Encounter (Signed)
"  I was there yesterday and they scheduled me for surgery on Desare 11.  I need to reschedule that.  Does he have anything available the week of May 20?"  Yes, he can do it on May 21.  "Oh okay, that's perfect!  What time?"  I cannot give you a time, someone from the surgical center will call and give you the arrival time either the Friday before your surgery date or the Monday before the surgery date.  "Okay, thank you .  I didn't know."

## 2017-07-03 ENCOUNTER — Telehealth: Payer: Self-pay | Admitting: *Deleted

## 2017-07-03 NOTE — Telephone Encounter (Signed)
"  I'm scheduled to have Neuroma surgery on my right foot on May 20.  My husband just informed me this morning that he may have to be away the 28th.  Dr. Paulla Dolly said it wasn't going to be a big surgery at all, not even stitches.  I was wondering if it would be okay for me to drive a small amount after my husband leaves on the 28th until he gets back.  Please let me know.  How long would it be if I could not drive for a small amount?"

## 2017-07-04 ENCOUNTER — Telehealth: Payer: Self-pay | Admitting: *Deleted

## 2017-07-04 NOTE — Telephone Encounter (Signed)
Pt asked how long would it be before she could drive after neuroma surgery, he husband would need to be out-of-town 8 days after.

## 2017-07-05 NOTE — Telephone Encounter (Signed)
"  I called the other day about my surgery."  Yes, you will be able to drive.  You are not having any bone cutting.  You will not be able to drive if you are taking narcotics.  "Yes, I know.  This is good to know because my daughter is saying mom you can't have surgery if dad is going to be going away."  You do not need to do any long distance driving, just locally.  "Okay great, thank you so much."

## 2017-07-11 ENCOUNTER — Other Ambulatory Visit: Payer: Self-pay | Admitting: Obstetrics and Gynecology

## 2017-07-11 DIAGNOSIS — M8580 Other specified disorders of bone density and structure, unspecified site: Secondary | ICD-10-CM

## 2017-07-11 NOTE — Progress Notes (Signed)
Chief Complaint  Patient presents with  . Medicare Wellness    fasting AWV no GYN, sees Dr.Jertson. No eye exam as she just had one. She does mention that she has a cough for a few weeks that comes and goes during the day. No other concerns.     [name] is a 72 y.o. female who presents for annual physical exam, Medicare wellness visit and follow-up on chronic medical conditions.  She has the following concerns:  Cough for a few weeks, comes/goes. She saw Dr Harold Hedge in April, with acute symptoms. Treated with steroid injection .Got better until a few weeks agoy.  Denies sniffling/sneezing. Allergies are to dust, no known seasonal allergies.  Cough is non-productive. She has used her inhaler, which seems to help stop coughing for about 3 hours.  Denies shortness of breath. Feels a little tight in her chest.  She has been taking Mucinex twice daily for about a week. She takes Delsym at night, which helps. She has been taking claritin daily for 2 weeks.  Chronic neck pain--used to be under the care of Dr. Maryjean Ka, doesn't want to go back.  At her last visit here 3 months ago she had cervical radiculopathy, that was treated with a course of steroids. She also was using 1/2 tablet of robaxin as needed for muscular pain. No longer has any pain or symptoms into her arm. Still has pain in her left neck/shoulder.  She hasn't been back to Dr. Maryjean Ka.  She has been going back to Dr. Patrice Paradise, getting her hydrocodone from him, sees his PA every 6 months. Only takes 1/2 pill every other day. SalonPas helps a lot, as well as other topical meds and massage. Reviewing her BP list, it shows pain levels chronically 3-7, sometimes up to 8.  She continues to have pain in her feet from neuroma and plans to have right side fixed next week.  Vitamin D deficiency--this is previously was monitored by GYN.  Last level was 29 on 06/15/16. Advised to be compliant with taking 1000 IU daily, with plans to recheck in a year (she  had missed some).  Labs were not done at GYN's with recent visit, due for recheck today. She has been taking 1000 IU daily.  Elevated BP's--discussed in detail at her February visit.  BP's were consistently elevated.  Pain may have been a contributing factor.  We discussed starting medication but she declined.  BP's have been running 130-171/80-91, mostly 140-150/85.   Tremor is much better since she cut back on caffeine.  Hands are no longer shaking.  Osteopenia: Last DEXA was 07/22/15.  T-1.9 at R fem neck and L forearm.  There was no significant change from 2 years prior.  FRAX score was normal. Review of the chart shows that she previously took fosamax, back in 2006. She stopped it on her own, due to fear of side effects.   Depression and insomnia--sees psychiatrist, doing well on current regimen. Stable on current regimen, goes twice yearly.  Immunization History  Administered Date(s) Administered  . Influenza Split 11/13/2015  . Influenza, High Dose Seasonal PF 03/05/2014, 12/15/2014  . Influenza-Unspecified 12/29/2012, 11/25/2016  . Pneumococcal Conjugate-13 07/02/2014  . Pneumococcal Polysaccharide-23 08/29/2007, 07/06/2015  . Tdap 08/29/2007  . Zoster 08/29/2007   Last Pap smear: 03/2014 with GYN, normal; last GYN exam 06/2017 Last mammogram: 06/2016, scheduled for 3D mammo 09/2017 Last colonoscopy: 09/16/14, Tubular Adenoma x 1, hyperplastic polyp x 1, repeat in 5 years Last DEXA:  07/22/15 T Score: Spine not testing / -1.9 Right Femur Neck / Left Femur Neck not done / -1.9 Left 1/3 Radius. Repeat is scheduled for 09/2017 Dentist: twice yearly Ophtho: yearly; plans L cataract surgery soon with Dr. Lucita Ferrara Exercise: yoga 2-3 times/week. Walks 15 minutes and some weights at the gym.  Hep C screen: negative, done by GYN 04/2015 Lipids:  Lab Results  Component Value Date   CHOL 187 07/07/2014   HDL 72 07/07/2014   LDLCALC 94 07/07/2014   TRIG 105 07/07/2014   CHOLHDL 2.6  07/07/2014    Other doctors caring for patient include: GYN: Dr. Talbert Nan Dentist: Dr. Jule Ser Ophtho: Dr. Marica Otter ([place] to do her surgery) Dermatologist: Dr. Renda Rolls Podiatrist: Dr. Paulla Dolly Ortho-spine: Dr. Rennis Harding Ortho--Dr. Alvan Dame (seen in the past for L hip bursitis) Neurosurgeon--Dr. Sherwood Gambler (no longer sees) Pain doctor--Dr. Maryjean Ka (previously saw Dr. Nelva Bush, also has seen Dr. Brien Few in the past) Psych: Debbe Bales (PA at [facility]) GI: Dr. Hilarie Fredrickson ENT: Dr. Redmond Baseman Allergist: Dr. Harold Hedge Physical Therapist: previously saw Linton Rump ([facility])   Depression screen: negative Fall screen: slipped on her stool in her closet, no injury. Functional status screen: unremarkable Mini-Cog screening: normal (5) See questionnaires in epic.   End of Life Discussion: Patient does havea living will and medical power of attorney  Past Medical History:  Diagnosis Date  . Anxiety    sees [facility] Debbe Bales, [place])  . Colon polyps 08/2014   tubular adenoma and hyperplastic polyps (Dr. Hilarie Fredrickson)  . DDD (degenerative disc disease), cervical dr Patrice Paradise   s/p surgery, ongoing pain  . Depression    h/o hospitalization with ECT  . History of insomnia   . Neck pain   . Osteopenia stopped fosamax ~2006  . Post-operative nausea and vomiting   . Vitamin D deficiency borderline(28)-2010   treated by her GYN    Past Surgical History:  Procedure Laterality Date  . BREAST REDUCTION SURGERY  08/2009  . HEMORRHOID SURGERY  2006, 12/2014  . KNEE ARTHROSCOPY Right   . LASIK     Dr. Lucita Ferrara  . NASAL SEPTOPLASTY W/ TURBINOPLASTY  1986  . NECK SURGERY  02/2010([facility])1/03,2005([place])  . REDUCTION MAMMAPLASTY Bilateral 07/24/2008  . TOTAL HIP ARTHROPLASTY Left 08/2007        Social History   Socioeconomic History  . Marital status: Married    Spouse name: Not on file  . Number of children: 3  . Years of education: Not on file  . Highest education level: Not on  file  Occupational History  . Not on file  Social Needs  . Financial resource strain: Not on file  . Food insecurity:    Worry: Not on file    Inability: Not on file  . Transportation needs:    Medical: Not on file    Non-medical: Not on file  Tobacco Use  . Smoking status: Never Smoker  . Smokeless tobacco: Never Used  Substance and Sexual Activity  . Alcohol use: Yes    Alcohol/week: 4.2 oz    Types: 7 Glasses of wine per week    Comment: has 1 glass of wine most nights (occasionally 2) or 1/2 martini  . Drug use: No  . Sexual activity: Not Currently    Partners: Male    Birth control/protection: Post-menopausal  Lifestyle  . Physical activity:    Days per week: Not on file    Minutes per session: Not on file  . Stress:  Not on file  Relationships  . Social connections:    Talks on phone: Not on file    Gets together: Not on file    Attends religious service: Not on file    Active member of club or organization: Not on file    Attends meetings of clubs or organizations: Not on file    Relationship status: Not on file  . Intimate partner violence:    Fear of current or ex partner: Not on file    Emotionally abused: Not on file    Physically abused: Not on file    Forced sexual activity: Not on file  Other Topics Concern  . Not on file  Social History Narrative   Lives with husband. Has 3 kids, 4 grandchildren. Daughter (Kendra--drug rep, lives here in [place])   Other children live in [place] and [place].    Family History  Problem Relation Age of Onset  . Arthritis Mother   . Depression Mother   . Pernicious anemia Mother   . Heart disease Father 7       MI  . Depression Brother   . Diabetes Paternal Uncle   . Breast cancer Cousin        paternal    Outpatient Encounter Medications as of 07/12/2017  Medication Sig Note  . Calcium Carbonate-Vit D-Min (CALCIUM 1200 PO) Take 1,200 mg by mouth daily.   . cholecalciferol (VITAMIN D) 1000 UNITS tablet Take  2,000 Units by mouth daily.    . Cyanocobalamin (B-12) 5000 MCG CAPS Take by mouth daily.   Marland Kitchen HYDROcodone-acetaminophen (NORCO) 10-325 MG tablet Take 0.5 tablets by mouth as needed. 03/21/2017: Takes 1/2 tablet once daily if needed, sometimes 3x/week  . lamoTRIgine (LAMICTAL) 150 MG tablet Take 150 mg by mouth at bedtime.     . Multiple Vitamin (MULTIVITAMIN) tablet Take 1 tablet by mouth daily.   Marland Kitchen PROAIR HFA 108 (90 Base) MCG/ACT inhaler Inhale 2 puffs into the lungs every 4 (four) hours as needed for wheezing or shortness of breath. 07/12/2017: Using twice daily for the last 2 weeks  . zolpidem (AMBIEN) 10 MG tablet Take 2.5 mg by mouth at bedtime as needed.  07/06/2015: Takes 1/4th tablet every night (from psych)   No facility-administered encounter medications on file as of 07/12/2017.     Allergies  Allergen Reactions  . Cefdinir     REACTION: rash  . Cymbalta [Duloxetine Hcl]     Abdominal pain  . Nitrofurantoin [Macrodantin]   . Sulfamethoxazole Hives    ROS: The patient denies anorexia, fever, weight changes, headaches, vision changes (somewhat harder to see at night due to cataract), ear pain, sore throat, breast concerns, chest pain, palpitations, dizziness, syncope, dyspnea on exertion, swelling, nausea, vomiting, diarrhea, constipation, abdominal pain, melena, hematochezia, indigestion/heartburn, hematuria, incontinence, dysuria, vaginal bleeding, discharge, odor or itch, genital lesions, numbness, tingling, weakness, tremor, suspicious skin lesions, depression, anxiety, abnormal bleeding/bruising, or enlarged lymph nodes. +hearing loss (has hearing aids, uses intermittently) Denies tremor (resolved after stopping coffee) h/o childhood asthma, better after age 60 until recent bouts requiring inhaler. Under care of allergist. Neck and foot pain per HPI   PHYSICAL EXAM:  BP (!) 154/96   Pulse 84   Temp (!) 97.4 F (36.3 C) (Tympanic)   Ht 5' 1.75" (1.568 m)   Wt 125 lb  (56.7 kg)   LMP 08/15/1988 (Exact Date)   BMI 23.05 kg/m   Wt Readings from Last 3 Encounters:  07/12/17 125 lb (56.7  kg)  06/28/17 125 lb (56.7 kg)  04/20/17 127 lb 9.6 oz (57.9 kg)    General Appearance:   Alert, cooperative, no distress, appears stated age. Frequent throat clearing during visit. No coughing.  Head:   Normocephalic, without obvious abnormality, atraumatic  Eyes:   PERRL, conjunctiva/corneas clear, EOM's intact, fundi benign, cataract noted on the left  Ears:   Normal TM's and external ear canals  Nose:  Nares normal, mucosa with mild edema, some yellow crusting on right, inflamed on left; no sinus tenderness  Throat:  Lips, mucosa, and tongue normal; teeth and gums normal  Neck:  Supple, no lymphadenopathy; thyroid: no enlargement/tenderness/nodules; no carotid bruit or JVD. WHSS posteriorly (nontender). Tender trigger point/nodule at her left upper neck, below occiput.  Spine nontender  Back:  lumbar and thoracic spine nontender, no curvature, ROM normal, no CVA tenderness. Area of discomfort at L trapezius, no spasm noted today  Lungs:   Clear to auscultation bilaterally without wheezes, rales or ronchi; respirations unlabored  Chest Wall:   No tenderness or deformity  Heart:   Regular rate and rhythm, S1 and S2 normal, no murmur, rub or gallop  Breast Exam:   Deferred to GYN  Abdomen:   Soft, non-tender, nondistended, normoactive bowel sounds, no masses, no hepatosplenomegaly  Genitalia:   Deferred to GYN     Extremities:  No clubbing, cyanosis or edema.   Pulses:  2+ and symmetric all extremities  Skin:  Skin color, texture, turgor normal, no rashes or lesions  Lymph nodes:  Cervical, supraclavicular, and axillary nodes normal  Neurologic:  CNII-XII intact, normal strength, sensation and gait; reflexes 2+, slightly decreased DTR in BUE's.   Psych: Normal mood, affect, hygiene  and grooming.   ASSESSMENT/PLAN:  Annual physical exam - Plan: POCT Urinalysis DIP (Proadvantage Device), CBC with Differential/Platelet, Comprehensive metabolic panel, TSH, VITAMIN D 25 Hydroxy (Vit-D Deficiency, Fractures)  Encounter for Medicare annual wellness exam  Vitamin D deficiency - continue daily supplements - Plan: VITAMIN D 25 Hydroxy (Vit-D Deficiency, Fractures)  Osteopenia, unspecified location - discussed Ca, D, weight-bearing exercise.  DEXA scheduled for August  Chronic neck pain - not adequately controlled per pain scores which may be affecting her BP. Consider f/u with pain doc; for now, agrees to see PT  Essential hypertension - new diagnosis. Start low dose amlodipine, monitor at home, f/u 4wks - Plan: Comprehensive metabolic panel, amLODipine (NORVASC) 2.5 MG tablet  Fatigue, unspecified type - Plan: CBC with Differential/Platelet, Comprehensive metabolic panel, TSH, VITAMIN D 25 Hydroxy (Vit-D Deficiency, Fractures)  Cough - suspect related to PND. Cont mucinex, Delsym. treat allergies. reviewed s/sx infection, return if develop - Plan: benzonatate (TESSALON) 200 MG capsule  Immunization due - to get tetanus booster (Td or TdaP) and shingrix from pharmacy. written rx given for tetanus   CBC, c-met, TSH, Vit D  Needs Td vs Tdap from pharmacy Shingrix recommended, from pharmacy  Full Code, Full Care  Start amlodipine 2.'5mg'$ --discussed side effects, monitoring BP, treating pain. Check at home and bring list to f/u. Avoiding ACEI due to already having cough. Avoiding BB due to RAD, and tremor resolved.  Discussed monthly self breast exams and yearly mammograms; at least 30 minutes of aerobic activity at least 5 days/week, weight-bearing exercise at least 2x/wk; proper sunscreen use reviewed; healthy diet, including goals of calcium and vitamin D intake and alcohol recommendations (less than or equal to 1 drink/day) reviewed; regular seatbelt use; changing  batteries in smoke detectors. Immunization recommendations discussed--continue  yearly flu shots. Td vs TdaP from pharmacy. Shingrix recommended, risks/side effects discussed. Colonoscopy recommendations reviewed--UTD, due again 08/2019.   Due for f/u DEXA, scheduled for August (along with mammo)  F/u 4 wks on BP.  Visit length over an hour, more than 1/2 spent counseling.   Medicare Attestation I have personally reviewed: The patient's medical and social history Their use of alcohol, tobacco or illicit drugs Their current medications and supplements The patient's functional ability including ADLs,fall risks, home safety risks, cognitive, and hearing and visual impairment Diet and physical activities Evidence for depression or mood disorders  The patient's weight, height and BMI have been recorded in the chart.  I have made referrals, counseling, and provided education to the patient based on review of the above and I have provided the patient with a written personalized care plan for preventive services.

## 2017-07-12 ENCOUNTER — Ambulatory Visit (INDEPENDENT_AMBULATORY_CARE_PROVIDER_SITE_OTHER): Payer: PPO | Admitting: Family Medicine

## 2017-07-12 ENCOUNTER — Encounter: Payer: Self-pay | Admitting: Family Medicine

## 2017-07-12 VITALS — BP 154/96 | HR 84 | Temp 97.4°F | Ht 61.75 in | Wt 125.0 lb

## 2017-07-12 DIAGNOSIS — M542 Cervicalgia: Secondary | ICD-10-CM

## 2017-07-12 DIAGNOSIS — Z0000 Encounter for general adult medical examination without abnormal findings: Secondary | ICD-10-CM | POA: Diagnosis not present

## 2017-07-12 DIAGNOSIS — G8929 Other chronic pain: Secondary | ICD-10-CM | POA: Diagnosis not present

## 2017-07-12 DIAGNOSIS — I10 Essential (primary) hypertension: Secondary | ICD-10-CM | POA: Diagnosis not present

## 2017-07-12 DIAGNOSIS — E559 Vitamin D deficiency, unspecified: Secondary | ICD-10-CM | POA: Diagnosis not present

## 2017-07-12 DIAGNOSIS — M8580 Other specified disorders of bone density and structure, unspecified site: Secondary | ICD-10-CM

## 2017-07-12 DIAGNOSIS — R05 Cough: Secondary | ICD-10-CM | POA: Diagnosis not present

## 2017-07-12 DIAGNOSIS — Z23 Encounter for immunization: Secondary | ICD-10-CM

## 2017-07-12 DIAGNOSIS — R059 Cough, unspecified: Secondary | ICD-10-CM

## 2017-07-12 DIAGNOSIS — R5383 Other fatigue: Secondary | ICD-10-CM

## 2017-07-12 LAB — POCT URINALYSIS DIP (PROADVANTAGE DEVICE)
GLUCOSE UA: NEGATIVE mg/dL
Ketones, POC UA: NEGATIVE mg/dL
NITRITE UA: NEGATIVE
PH UA: 6 (ref 5.0–8.0)
Specific Gravity, Urine: NEGATIVE
UUROB: NEGATIVE

## 2017-07-12 MED ORDER — BENZONATATE 200 MG PO CAPS: 200.0000 mg | ORAL_CAPSULE | Freq: Three times a day (TID) | ORAL | 0 refills | Status: DC | PRN

## 2017-07-12 MED ORDER — AMLODIPINE BESYLATE 2.5 MG PO TABS: 2.5000 mg | ORAL_TABLET | Freq: Every day | ORAL | 1 refills | Status: DC

## 2017-07-12 NOTE — Patient Instructions (Signed)
HEALTH MAINTENANCE RECOMMENDATIONS:  It is recommended that you get at least 30 minutes of aerobic exercise at least 5 days/week (for weight loss, you may need as much as 60-90 minutes). This can be any activity that gets your heart rate up. This can be divided in 10-15 minute intervals if needed, but try and build up your endurance at least once a week.  Weight bearing exercise is also recommended twice weekly.  Eat a healthy diet with lots of vegetables, fruits and fiber.  "Colorful" foods have a lot of vitamins (ie green vegetables, tomatoes, red peppers, etc).  Limit sweet tea, regular sodas and alcoholic beverages, all of which has a lot of calories and sugar.  Up to 1 alcoholic drink daily may be beneficial for women (unless trying to lose weight, watch sugars).  Drink a lot of water.  Calcium recommendations are 1200-1500 mg daily (1500 mg for postmenopausal women or women without ovaries), and vitamin D 1000 IU daily.  This should be obtained from diet and/or supplements (vitamins), and calcium should not be taken all at once, but in divided doses.  Monthly self breast exams and yearly mammograms for women over the age of 41 is recommended.  Sunscreen of at least SPF 30 should be used on all sun-exposed parts of the skin when outside between the hours of 10 am and 4 pm (not just when at beach or pool, but even with exercise, golf, tennis, and yard work!)  Use a sunscreen that says "broad spectrum" so it covers both UVA and UVB rays, and make sure to reapply every 1-2 hours.  Remember to change the batteries in your smoke detectors when changing your clock times in the spring and fall.  Use your seat belt every time you are in a car, and please drive safely and not be distracted with cell phones and texting while driving.   [name] , Thank you for taking time to come for your Medicare Wellness Visit. I appreciate your ongoing commitment to your health goals. Please review the following  plan we discussed and let me know if I can assist you in the future.   These are the goals we discussed: Goals    None      This is a list of the screening recommended for you and due dates:  Health Maintenance  Topic Date Due  . Mammogram  07/21/2017  . Tetanus Vaccine  08/28/2017  . Flu Shot  09/28/2017  . Colon Cancer Screening  09/16/2019  . DEXA scan (bone density measurement)  Completed  .  Hepatitis C: One time screening is recommended by Center for Disease Control  (CDC) for  adults born from [birth year] through 1967.   Completed  . Pneumonia vaccines  Completed   Take the prescription to your pharmacy and get either Td or Tdap (tetanus booster). Let us know the date and type that you get.  I recommend getting the new shingles vaccine (Shingrix). You will need to check with your insurance to see if it is covered, and if covered by Medicare Part D, you need to get from the pharmacy rather than our office.  It is a series of 2 injections, spaced 2 months apart.  Keep your bone density test and mammogram as scheduled.  Drink plenty of water. Continue Mucinex twice daily and delsym if needed for cough. You can also try the tessalon perles to see if they help, especially during the day. If you develop fever, discolored mucus or  phlegm, please return.   Use saline spray and consider sinus rinses once or twice daily.  Consider following up with either physical therapy or your pain doctors for trigger point releases and/or to discuss other treatment options if you don't want to live with pain levels of 3-6 most of the time. Continue your patches, topical therapies and massages.  Bring your blood pressure monitor and list of blood pressures to your next visit. Start the amlodipine once daily. This is a very low dose. Feel free to return later this week to have your monitor checked sooner, if you prefer.

## 2017-07-13 LAB — COMPREHENSIVE METABOLIC PANEL
ALT: 16 IU/L (ref 0–32)
AST: 16 IU/L (ref 0–40)
Albumin/Globulin Ratio: 2.4 — ABNORMAL HIGH (ref 1.2–2.2)
Albumin: 4.5 g/dL (ref 3.5–4.8)
Alkaline Phosphatase: 77 IU/L (ref 39–117)
BUN / CREAT RATIO: 21 (ref 12–28)
BUN: 14 mg/dL (ref 8–27)
Bilirubin Total: 0.4 mg/dL (ref 0.0–1.2)
CALCIUM: 9.2 mg/dL (ref 8.7–10.3)
CHLORIDE: 95 mmol/L — AB (ref 96–106)
CO2: 27 mmol/L (ref 20–29)
Creatinine, Ser: 0.68 mg/dL (ref 0.57–1.00)
GFR, EST AFRICAN AMERICAN: 100 mL/min/{1.73_m2} (ref >59)
GFR, EST NON AFRICAN AMERICAN: 87 mL/min/{1.73_m2} (ref >59)
Globulin, Total: 1.9 g/dL (ref 1.5–4.5)
Glucose: 88 mg/dL (ref 65–99)
POTASSIUM: 4.6 mmol/L (ref 3.5–5.2)
Sodium: 135 mmol/L (ref 134–144)
TOTAL PROTEIN: 6.4 g/dL (ref 6.0–8.5)

## 2017-07-13 LAB — CBC WITH DIFFERENTIAL/PLATELET
BASOS: 0 %
Basophils Absolute: 0 10*3/uL (ref 0.0–0.2)
EOS (ABSOLUTE): 0.3 10*3/uL (ref 0.0–0.4)
EOS: 5 %
Hematocrit: 40.4 % (ref 34.0–46.6)
Hemoglobin: 13.3 g/dL (ref 11.1–15.9)
IMMATURE GRANS (ABS): 0 10*3/uL (ref 0.0–0.1)
IMMATURE GRANULOCYTES: 0 %
LYMPHS: 33 %
Lymphocytes Absolute: 1.8 10*3/uL (ref 0.7–3.1)
MCH: 29.2 pg (ref 26.6–33.0)
MCHC: 32.9 g/dL (ref 31.5–35.7)
MCV: 89 fL (ref 79–97)
Monocytes Absolute: 0.4 10*3/uL (ref 0.1–0.9)
Monocytes: 7 %
NEUTROS PCT: 55 %
Neutrophils Absolute: 3 10*3/uL (ref 1.4–7.0)
Platelets: 344 10*3/uL (ref 150–379)
RBC: 4.56 x10E6/uL (ref 3.77–5.28)
RDW: 14 % (ref 12.3–15.4)
WBC: 5.4 10*3/uL (ref 3.4–10.8)

## 2017-07-13 LAB — TSH: TSH: 1.53 u[IU]/mL (ref 0.450–4.500)

## 2017-07-13 LAB — VITAMIN D 25 HYDROXY (VIT D DEFICIENCY, FRACTURES): Vit D, 25-Hydroxy: 29.4 ng/mL — ABNORMAL LOW (ref 30.0–100.0)

## 2017-07-17 ENCOUNTER — Telehealth: Payer: Self-pay | Admitting: *Deleted
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
## 2017-07-20 DIAGNOSIS — R070 Pain in throat: Secondary | ICD-10-CM | POA: Diagnosis not present

## 2017-07-20 DIAGNOSIS — K219 Gastro-esophageal reflux disease without esophagitis: Secondary | ICD-10-CM | POA: Diagnosis not present

## 2017-07-20 DIAGNOSIS — J3089 Other allergic rhinitis: Secondary | ICD-10-CM | POA: Diagnosis not present

## 2017-07-20 DIAGNOSIS — R062 Wheezing: Secondary | ICD-10-CM | POA: Diagnosis not present

## 2017-07-26 ENCOUNTER — Other Ambulatory Visit: Payer: PPO

## 2017-07-27 DIAGNOSIS — G894 Chronic pain syndrome: Secondary | ICD-10-CM | POA: Diagnosis not present

## 2017-07-27 DIAGNOSIS — M542 Cervicalgia: Secondary | ICD-10-CM | POA: Diagnosis not present

## 2017-07-28 DIAGNOSIS — K644 Residual hemorrhoidal skin tags: Secondary | ICD-10-CM | POA: Diagnosis not present

## 2017-08-17 ENCOUNTER — Encounter: Payer: Self-pay | Admitting: Family Medicine
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
## 2017-08-21 ENCOUNTER — Encounter: Payer: Self-pay | Admitting: Family Medicine

## 2017-08-21 ENCOUNTER — Ambulatory Visit (INDEPENDENT_AMBULATORY_CARE_PROVIDER_SITE_OTHER): Payer: PPO | Admitting: Family Medicine

## 2017-08-21 VITALS — BP 138/90 | HR 64 | Ht 61.75 in | Wt 127.2 lb

## 2017-08-21 DIAGNOSIS — M542 Cervicalgia: Secondary | ICD-10-CM

## 2017-08-21 DIAGNOSIS — G8929 Other chronic pain: Secondary | ICD-10-CM | POA: Diagnosis not present

## 2017-08-21 DIAGNOSIS — I10 Essential (primary) hypertension: Secondary | ICD-10-CM | POA: Diagnosis not present

## 2017-08-21 MED ORDER — AMLODIPINE BESYLATE 2.5 MG PO TABS: 2.5000 mg | ORAL_TABLET | Freq: Every day | ORAL | 1 refills | Status: DC

## 2017-08-21 NOTE — Patient Instructions (Signed)
  Please try and remember to get your tetanus shot from the pharmacy as we previously discussed (due in July).  Avoid doing the new yoga, which may be triggering more neck pain. I recommend following up either with Brad for physical therapy, or with the pain doctor. We can refer you to the neurosurgeon Dr. Aris Lot, but that make take a few months to get in, so try and work on getting pain reduced in the interim.  Continue to periodically monitor your blood pressure.  Return if it is persistently over 140/90, or if it is low, and you are feeling lightheaded and dizzy.  I would expect it to get low if your pain is adequately treated.

## 2017-08-28 DIAGNOSIS — M542 Cervicalgia: Secondary | ICD-10-CM | POA: Diagnosis not present

## 2017-08-28 DIAGNOSIS — Z981 Arthrodesis status: Secondary | ICD-10-CM | POA: Diagnosis not present

## 2017-09-05 DIAGNOSIS — M542 Cervicalgia: Secondary | ICD-10-CM | POA: Diagnosis not present

## 2017-09-05 DIAGNOSIS — Z981 Arthrodesis status: Secondary | ICD-10-CM | POA: Diagnosis not present

## 2017-09-05 DIAGNOSIS — M4802 Spinal stenosis, cervical region: Secondary | ICD-10-CM | POA: Diagnosis not present

## 2017-09-05 DIAGNOSIS — M435X3 Other recurrent vertebral dislocation, cervicothoracic region: Secondary | ICD-10-CM | POA: Diagnosis not present

## 2017-09-05 DIAGNOSIS — M4310 Spondylolisthesis, site unspecified: Secondary | ICD-10-CM | POA: Diagnosis not present

## 2017-09-05 DIAGNOSIS — M4803 Spinal stenosis, cervicothoracic region: Secondary | ICD-10-CM | POA: Diagnosis not present

## 2017-09-18 DIAGNOSIS — F3342 Major depressive disorder, recurrent, in full remission: Secondary | ICD-10-CM | POA: Diagnosis not present

## 2017-10-05 DIAGNOSIS — M47812 Spondylosis without myelopathy or radiculopathy, cervical region: Secondary | ICD-10-CM | POA: Diagnosis not present

## 2017-10-09 ENCOUNTER — Ambulatory Visit: Payer: PPO

## 2017-10-09 ENCOUNTER — Other Ambulatory Visit: Payer: PPO

## 2017-10-09 DIAGNOSIS — H2512 Age-related nuclear cataract, left eye: Secondary | ICD-10-CM | POA: Diagnosis not present

## 2017-10-09 DIAGNOSIS — H2513 Age-related nuclear cataract, bilateral: Secondary | ICD-10-CM | POA: Diagnosis not present

## 2017-10-10 ENCOUNTER — Ambulatory Visit: Payer: PPO

## 2017-10-10 ENCOUNTER — Other Ambulatory Visit: Payer: PPO

## 2017-10-16 DIAGNOSIS — M19012 Primary osteoarthritis, left shoulder: Secondary | ICD-10-CM | POA: Diagnosis not present

## 2017-10-26 DIAGNOSIS — M47812 Spondylosis without myelopathy or radiculopathy, cervical region: Secondary | ICD-10-CM | POA: Diagnosis not present

## 2017-11-06 DIAGNOSIS — J3089 Other allergic rhinitis: Secondary | ICD-10-CM | POA: Diagnosis not present

## 2017-11-06 DIAGNOSIS — J209 Acute bronchitis, unspecified: Secondary | ICD-10-CM | POA: Diagnosis not present

## 2017-11-06 DIAGNOSIS — R062 Wheezing: Secondary | ICD-10-CM | POA: Diagnosis not present

## 2017-11-06 DIAGNOSIS — K219 Gastro-esophageal reflux disease without esophagitis: Secondary | ICD-10-CM | POA: Diagnosis not present

## 2017-11-21 DIAGNOSIS — Z961 Presence of intraocular lens: Secondary | ICD-10-CM | POA: Diagnosis not present

## 2017-11-21 DIAGNOSIS — H2511 Age-related nuclear cataract, right eye: Secondary | ICD-10-CM | POA: Diagnosis not present

## 2017-11-21 DIAGNOSIS — H25812 Combined forms of age-related cataract, left eye: Secondary | ICD-10-CM | POA: Diagnosis not present

## 2017-11-21 DIAGNOSIS — H2512 Age-related nuclear cataract, left eye: Secondary | ICD-10-CM | POA: Diagnosis not present

## 2017-11-21 DIAGNOSIS — Z9842 Cataract extraction status, left eye: Secondary | ICD-10-CM | POA: Diagnosis not present

## 2017-11-22 DIAGNOSIS — H2511 Age-related nuclear cataract, right eye: Secondary | ICD-10-CM | POA: Diagnosis not present

## 2017-11-27 DIAGNOSIS — Z23 Encounter for immunization: Secondary | ICD-10-CM | POA: Diagnosis not present

## 2017-11-27 DIAGNOSIS — L565 Disseminated superficial actinic porokeratosis (DSAP): Secondary | ICD-10-CM | POA: Diagnosis not present

## 2017-11-27 DIAGNOSIS — L821 Other seborrheic keratosis: Secondary | ICD-10-CM | POA: Diagnosis not present

## 2017-11-28 ENCOUNTER — Ambulatory Visit: Payer: PPO

## 2017-11-28 ENCOUNTER — Other Ambulatory Visit: Payer: PPO

## 2017-11-28 DIAGNOSIS — H2511 Age-related nuclear cataract, right eye: Secondary | ICD-10-CM | POA: Diagnosis not present

## 2017-11-28 DIAGNOSIS — H25811 Combined forms of age-related cataract, right eye: Secondary | ICD-10-CM | POA: Diagnosis not present

## 2017-11-30 ENCOUNTER — Telehealth: Payer: Self-pay | Admitting: Family Medicine

## 2017-11-30 NOTE — Telephone Encounter (Signed)
Pt called and stated that she recently developed a skin issue. She did see dermatologist and was told that it was due to genetics and sun exposure. She did tell them that she was recently put on Amlodipine, about 3 months ago. She wants to know if it could be a reaction to this medication. Please advise at [phone].

## 2017-11-30 NOTE — Telephone Encounter (Signed)
Advise pt--this medication is not usually associated with any skin issues (doesn't cause sun sensitivity like diuretics);  There is some reported eczema type problem in elderly patients taking it chronically, but she hasn't been on it that long (I don't know what type of rash she had, but not likely related to this medication).

## 2017-11-30 NOTE — Telephone Encounter (Signed)
Patient advised.

## 2017-12-04 ENCOUNTER — Ambulatory Visit
Admission: RE | Admit: 2017-12-04 | Discharge: 2017-12-04 | Disposition: A | Discharge status: Home / Self Care | Payer: PPO | Source: Ambulatory Visit | Attending: Family Medicine | Admitting: Family Medicine

## 2017-12-04 ENCOUNTER — Encounter: Payer: Self-pay | Admitting: Family Medicine

## 2017-12-04 ENCOUNTER — Ambulatory Visit (INDEPENDENT_AMBULATORY_CARE_PROVIDER_SITE_OTHER): Payer: PPO | Admitting: Family Medicine

## 2017-12-04 VITALS — BP 138/86 | HR 84 | Temp 98.7°F | Ht 61.75 in | Wt 124.6 lb

## 2017-12-04 DIAGNOSIS — L820 Inflamed seborrheic keratosis: Secondary | ICD-10-CM | POA: Diagnosis not present

## 2017-12-04 DIAGNOSIS — J40 Bronchitis, not specified as acute or chronic: Secondary | ICD-10-CM | POA: Diagnosis not present

## 2017-12-04 DIAGNOSIS — M542 Cervicalgia: Secondary | ICD-10-CM | POA: Diagnosis not present

## 2017-12-04 DIAGNOSIS — I10 Essential (primary) hypertension: Secondary | ICD-10-CM

## 2017-12-04 DIAGNOSIS — G8929 Other chronic pain: Secondary | ICD-10-CM

## 2017-12-04 DIAGNOSIS — J9811 Atelectasis: Secondary | ICD-10-CM | POA: Diagnosis not present

## 2017-12-04 DIAGNOSIS — Z23 Encounter for immunization: Secondary | ICD-10-CM

## 2017-12-04 NOTE — Progress Notes (Signed)
Chief Complaint  Patient presents with  . Rash    believes this is from her amlodipine. Did she derm but still feels this is from her medication. Also mentions that she has had a bunch of steroid shots as well.    Patient presents with complaint of rash on her legs and arms.  She reports that on 9/7 [place], came [place] sick with bronchitis. She went to the doctor 9/9 (Dr. Harold Hedge), treated with an injection (steroids) and prednisone (no antibiotics) and inhalers.  Rash on her legs "popped out all at one time" 3 days after being on the prednisone.  She saw her dermatologist for this rash--she usually sees Dr. Delman Cheadle or Dr. Renda Rolls.  She got in with the PA, who apparently showed a picture of the rash and discussed with Dr. Renda Rolls.  She reports being told it was "genetic", and they treated a bunch of the lesions with liquid nitrogen.  They are now red.  She is very embarrassed with the appearance of the rash/lesions.  She has had some similar skin problems in the past, showing me an AVS with diagnosis of intrinsic eczema, seborrheic keratoses, lentigines, milia.  Rash started prior to her cataract surgery. Vision is improved.  She continues to have chronic pain.  Had 2 rounds of facet shots as recommended by the doctors at [place], done in [place]. Didn't help with her pain.  She is due for f/u. She continues to see Dr. Maryjean Ka.  She was started on amlodipine for HTN, and she wonders if this medication can be causing the rash. She did research, and brings in printed papers about amlodpine eczema. She denies the rash being itchy at all.  Again, started while on prednisone course for bronchitis. Has been on amlodipine since May.  She denies edema or other side effects, just worries that this can be causing the rash.  Current pain score 6-7/10  PMH, PSH, SH reviewed  Outpatient Encounter Medications as of 12/04/2017  Medication Sig Note  . amLODipine (NORVASC)  2.5 MG tablet Take 1 tablet (2.5 mg total) by mouth daily.   . Calcium Carbonate-Vit D-Min (CALCIUM 1200 PO) Take 1,200 mg by mouth daily.   . cholecalciferol (VITAMIN D) 1000 UNITS tablet Take 1,000 Units by mouth daily.    . Cyanocobalamin (B-12) 5000 MCG CAPS Take by mouth daily.   Marland Kitchen HYDROcodone-acetaminophen (NORCO) 10-325 MG tablet Take 0.5 tablets by mouth as needed. 03/21/2017: Takes 1/2 tablet once daily if needed, sometimes 3x/week  . lamoTRIgine (LAMICTAL) 150 MG tablet Take 150 mg by mouth at bedtime.     . Multiple Vitamin (MULTIVITAMIN) tablet Take 1 tablet by mouth daily.   . Multiple Vitamins-Minerals (HAIR SKIN AND NAILS FORMULA PO) Take 1 tablet by mouth daily.   Marland Kitchen PROAIR HFA 108 (90 Base) MCG/ACT inhaler Inhale 2 puffs into the lungs every 4 (four) hours as needed for wheezing or shortness of breath. 07/12/2017: Using twice daily for the last 2 weeks  . zolpidem (AMBIEN) 10 MG tablet Take 2.5 mg by mouth at bedtime as needed.  07/06/2015: Takes 1/4th tablet every night (from psych)   No facility-administered encounter medications on file as of 12/04/2017.    Allergies  Allergen Reactions  . Cefdinir     REACTION: rash  . Cymbalta [Duloxetine Hcl]     Abdominal pain  . Nitrofurantoin [Macrodantin]   . Sulfamethoxazole Hives    ROS: no fever, chills, URI symptoms.  No headaches. +chronic neck/back pain.  +rash on arms, and lower legs, extending to mid-thighs.  Denies itching. Denies chest pain, shortness of breath, or other concerns except as noted in HPI   PHYSICAL EXAM:  BP 138/86   Pulse 84   Temp 98.7 F (37.1 C) (Tympanic)   Ht 5' 1.75" (1.568 m)   Wt 124 lb 9.6 oz (56.5 kg)   LMP 08/15/1988 (Exact Date)   BMI 22.97 kg/m   Well appearing, thin female, mildly anxious, and very self-conscious about the appearance of her arms/legs Heart: regular rate and rhythm Lungs: clear bilaterally, no wheezes, rales, ronchi Neck: no lymphadenopathy or mass Skin: many  scattered pigmented lesions on lower legs and arms.  Many or somewhat raised/roughened, consisted with faint SK's, also has some solar lentingines.  There are many scattered erythematous round areas, that are healing from treatment with cryotherapy.  There is no crusting, surrounding erythema or tenderness on exam. Rash involves both forearms, and up to mid-thighs bilaterally Neuro: alert and oriented, cranial nerves intact.  Extremities: no edema, thin legs Psych: somewhat anxious, full range of motion. Normal speech, eye contact.   ASSESSMENT/PLAN:  Seborrheic keratosis, inflamed - fairly acute onset on extremities, per pt--worrisome for underlying process? check CXR. inflamed areas are from recent cryo--use antibact ointment. f/u w/derm - Plan: DG Chest 2 View  Need for influenza vaccination - Plan: Flu vaccine HIGH DOSE PF (Fluzone High dose)  Essential hypertension - BP improved, borderline (still in pain). This does not appear to be eczemaous, which would be c/w issue with amlodipine. Cont for now, consider change at fu  Chronic neck pain - encouraged her to f/u with pain doctor  Bronchitis - resolved with steroids/inhalers - Plan: DG Chest 2 View  She is UTD on screening for colon and breast cancer. Colonoscopy 2016 Scheduled for mammo and DEXA 10/9  Continue amlodipine for now--this doesn't appear to be eczema.  It does appear to be due to some seborrheic keratosis and maybe some solar lentigines (age spots). We are going to send you for an x-ray of your chest just to make sure we aren't missing anything.  Sometimes these can flare suddenly due to an underlying cancer, so make sure you are up to date on all of your routine screenings.   F/u January, sooner prn. To f/u with derm in the near future Counseled re: her embarrassment over appearance, self esteem, etc

## 2017-12-04 NOTE — Patient Instructions (Signed)
Continue amlodipine for now--this doesn't appear to be eczema.  It does appear to be due to some seborrheic keratosis and maybe some solar lentigines (age spots). The acute nature of the onset of the SK's can be concerning.  We are going to send you for an x-ray of your chest just to make sure we aren't missing anything.  Sometimes these can flare suddenly due to an underlying cancer, so make sure you are up to date on all of your routine screenings. (you are)  Go to [address] or [place] for the x-ray. Follow up as scheduled with the dermatologist.  Return here as needed, especially if they agree that your medication should be changed.

## 2017-12-06 ENCOUNTER — Ambulatory Visit
Admission: RE | Admit: 2017-12-06 | Discharge: 2017-12-06 | Disposition: A | Discharge status: Home / Self Care | Payer: PPO | Source: Ambulatory Visit | Attending: Obstetrics and Gynecology | Admitting: Obstetrics and Gynecology

## 2017-12-06 ENCOUNTER — Ambulatory Visit
Admission: RE | Admit: 2017-12-06 | Discharge: 2017-12-06 | Disposition: A | Discharge status: Home / Self Care | Payer: PPO | Source: Ambulatory Visit | Attending: Family Medicine | Admitting: Family Medicine

## 2017-12-06 DIAGNOSIS — Z780 Asymptomatic menopausal state: Secondary | ICD-10-CM | POA: Diagnosis not present

## 2017-12-06 DIAGNOSIS — Z139 Encounter for screening, unspecified: Secondary | ICD-10-CM

## 2017-12-06 DIAGNOSIS — Z1231 Encounter for screening mammogram for malignant neoplasm of breast: Secondary | ICD-10-CM | POA: Diagnosis not present

## 2017-12-06 DIAGNOSIS — M8589 Other specified disorders of bone density and structure, multiple sites: Secondary | ICD-10-CM | POA: Diagnosis not present

## 2017-12-06 DIAGNOSIS — M8580 Other specified disorders of bone density and structure, unspecified site: Secondary | ICD-10-CM

## 2017-12-11 NOTE — Progress Notes (Signed)
GYNECOLOGY  VISIT   HPI: 72 y.o.   Married White or Caucasian Not Hispanic or Latino  female   [phone] with Patient's last menstrual period was 08/15/1988 (exact date).   here for BMD consult. The patient's recent dexa returned with a T score of -2.1 in her hip, FRAX risk was 13.4% for any fracture and 3.6% for a hip fracture.  She had lab work with her primary in 5/19, she had a mildly low vit d, other lab work was normal. She isn't taking calcium supplements, she is getting calcium in her diet. She started a vit d supplement years ago, didn't increase her supplementation after her blood work in 5/19. She does a lot of yoga.   GYNECOLOGIC HISTORY: Patient's last menstrual period was 08/15/1988 (exact date). Contraception: Post menopausal Menopausal hormone therapy: None        OB History    Gravida  3   Para  3   Term  3   Preterm  0   AB  0   Living  3     SAB  0   TAB  0   Ectopic  0   Multiple  0   Live Births  3              Patient Active Problem List   Diagnosis Date Noted  . Fatigue 05/23/2016  . Cold intolerance 05/23/2016  . Depressive disorder, not elsewhere classified 11/30/2011  . Spinal stenosis 11/30/2011  . Neck pain on left side 11/30/2011  . ASTHMA 12/05/2007  . COUGH 12/05/2007    Past Medical History:  Diagnosis Date  . Anxiety    sees [facility], [place])  . Colon polyps 08/2014   tubular adenoma and hyperplastic polyps (Dr. Hilarie Fredrickson)  . DDD (degenerative disc disease), cervical dr Patrice Paradise   s/p surgery, ongoing pain  . Depression    h/o hospitalization with ECT  . History of insomnia   . Neck pain   . Osteopenia stopped fosamax ~2006  . Post-operative nausea and vomiting   . Vitamin D deficiency borderline(28)-2010   treated by her GYN    Past Surgical History:  Procedure Laterality Date  . BREAST REDUCTION SURGERY  08/2009  . CATARACT EXTRACTION, BILATERAL Bilateral Sept/October 2019  . HEMORRHOID SURGERY  2006,  12/2014  . KNEE ARTHROSCOPY Right   . LASIK     Dr. Lucita Ferrara  . NASAL SEPTOPLASTY W/ TURBINOPLASTY  1986  . NECK SURGERY  02/2010([facility])1/03,2005([facility])  . REDUCTION MAMMAPLASTY Bilateral 07/24/2008  . TOTAL HIP ARTHROPLASTY Left 08/2007        Current Outpatient Medications  Medication Sig Dispense Refill  . amLODipine (NORVASC) 2.5 MG tablet Take 1 tablet (2.5 mg total) by mouth daily. 90 tablet 1  . cholecalciferol (VITAMIN D) 1000 UNITS tablet Take 1,000 Units by mouth daily.     . Cyanocobalamin (B-12) 5000 MCG CAPS Take by mouth daily.    Marland Kitchen HYDROcodone-acetaminophen (NORCO) 10-325 MG tablet Take 0.5 tablets by mouth as needed.    . lamoTRIgine (LAMICTAL) 150 MG tablet Take 150 mg by mouth at bedtime.      . Multiple Vitamin (MULTIVITAMIN) tablet Take 1 tablet by mouth daily.    . Multiple Vitamins-Minerals (HAIR SKIN AND NAILS FORMULA PO) Take 1 tablet by mouth daily.    Marland Kitchen PROAIR HFA 108 (90 Base) MCG/ACT inhaler Inhale 2 puffs into the lungs every 4 (four) hours as needed for wheezing or shortness of breath. 1 Inhaler  1  . zolpidem (AMBIEN) 10 MG tablet Take 2.5 mg by mouth at bedtime as needed.     . Calcium Carbonate-Vit D-Min (CALCIUM 1200 PO) Take 1,200 mg by mouth daily.     No current facility-administered medications for this visit.   She isn't taking the calcium pills, too large to swallow.    ALLERGIES: Cefdinir; Cymbalta [duloxetine hcl]; Nitrofurantoin [macrodantin]; and Sulfamethoxazole  Family History  Problem Relation Age of Onset  . Arthritis Mother   . Depression Mother   . Pernicious anemia Mother   . Heart disease Father 51       MI  . Depression Brother   . Diabetes Paternal Uncle   . Breast cancer Cousin        paternal    Social History   Socioeconomic History  . Marital status: Married    Spouse name: Not on file  . Number of children: 3  . Years of education: Not on file  . Highest education level: Not on file  Occupational History  .  Not on file  Social Needs  . Financial resource strain: Not on file  . Food insecurity:    Worry: Not on file    Inability: Not on file  . Transportation needs:    Medical: Not on file    Non-medical: Not on file  Tobacco Use  . Smoking status: Never Smoker  . Smokeless tobacco: Never Used  Substance and Sexual Activity  . Alcohol use: Yes    Alcohol/week: 7.0 standard drinks    Types: 7 Glasses of wine per week    Comment: has 1 glass of wine most nights (occasionally 2) or 1/2 martini  . Drug use: No  . Sexual activity: Not Currently    Partners: Male    Birth control/protection: Post-menopausal  Lifestyle  . Physical activity:    Days per week: Not on file    Minutes per session: Not on file  . Stress: Not on file  Relationships  . Social connections:    Talks on phone: Not on file    Gets together: Not on file    Attends religious service: Not on file    Active member of club or organization: Not on file    Attends meetings of clubs or organizations: Not on file    Relationship status: Not on file  . Intimate partner violence:    Fear of current or ex partner: Not on file    Emotionally abused: Not on file    Physically abused: Not on file    Forced sexual activity: Not on file  Other Topics Concern  . Not on file  Social History Narrative   Lives with husband. Has 3 kids, 4 grandchildren. Daughter (Kendra--drug rep, lives here in [place])   Other children live in [place] and [place].    Review of Systems  Constitutional: Negative.   HENT: Negative.   Eyes: Negative.   Respiratory: Negative.   Cardiovascular: Negative.   Gastrointestinal: Negative.   Genitourinary: Negative.   Musculoskeletal: Positive for neck pain.  Skin: Negative.   Neurological: Negative.   Endo/Heme/Allergies: Negative.   Psychiatric/Behavioral: Positive for depression.    PHYSICAL EXAMINATION:    BP 130/72 (BP Location: Right Arm, Patient Position: Sitting, Cuff Size: Normal)    Pulse 80   Ht 5' 1.42" (1.56 m)   Wt 126 lb 6.4 oz (57.3 kg)   LMP 08/15/1988 (Exact Date)   BMI 23.56 kg/m     General  appearance: alert, cooperative and appears stated age   ASSESSMENT Osteopenia with elevated risk of fracture H/O vit d def    PLAN Discussed calcium, vit d and exercise Check vit D, calcium, phosphorous (other blood work normal in 5/19) Discussed options for treatment Will start Actonel (fosamax not covered), discussed risks and side effects (she will wait until her labs return to start) Calcium information given Information given on osteoporosis and treatment of osteoporosis (UTD handout given as well as epic handout)   An After Visit Summary was printed and given to the patient.  ~15 minutes face to face time of which over 50% was spent in counseling.

## 2017-12-12 DIAGNOSIS — H6123 Impacted cerumen, bilateral: Secondary | ICD-10-CM | POA: Diagnosis not present

## 2017-12-13 ENCOUNTER — Ambulatory Visit (INDEPENDENT_AMBULATORY_CARE_PROVIDER_SITE_OTHER): Payer: PPO | Admitting: Obstetrics and Gynecology

## 2017-12-13 ENCOUNTER — Encounter: Payer: Self-pay | Admitting: Obstetrics and Gynecology

## 2017-12-13 ENCOUNTER — Other Ambulatory Visit: Payer: Self-pay

## 2017-12-13 VITALS — BP 130/72 | HR 80 | Ht 61.42 in | Wt 126.4 lb

## 2017-12-13 DIAGNOSIS — L565 Disseminated superficial actinic porokeratosis (DSAP): Secondary | ICD-10-CM | POA: Diagnosis not present

## 2017-12-13 DIAGNOSIS — M8580 Other specified disorders of bone density and structure, unspecified site: Secondary | ICD-10-CM | POA: Diagnosis not present

## 2017-12-13 MED ORDER — RISEDRONATE SODIUM 35 MG PO TABS: 35.0000 mg | ORAL_TABLET | ORAL | 3 refills | Status: DC

## 2017-12-13 NOTE — Patient Instructions (Signed)
Osteoporosis Osteoporosis is the thinning and loss of density in the bones. Osteoporosis makes the bones more brittle, fragile, and likely to break (fracture). Over time, osteoporosis can cause the bones to become so weak that they fracture after a simple fall. The bones most likely to fracture are the bones in the hip, wrist, and spine. What are the causes? The exact cause is not known. What increases the risk? Anyone can develop osteoporosis. You may be at greater risk if you have a family history of the condition or have poor nutrition. You may also have a higher risk if you are:  Female.  50 years old or older.  A smoker.  Not physically active.  White or Asian.  Slender.  What are the signs or symptoms? A fracture might be the first sign of the disease, especially if it results from a fall or injury that would not usually cause a bone to break. Other signs and symptoms include:  Low back and neck pain.  Stooped posture.  Height loss.  How is this diagnosed? To make a diagnosis, your health care provider may:  Take a medical history.  Perform a physical exam.  Order tests, such as: ? A bone mineral density test. ? A dual-energy X-ray absorptiometry test.  How is this treated? The goal of osteoporosis treatment is to strengthen your bones to reduce your risk of a fracture. Treatment may involve:  Making lifestyle changes, such as: ? Eating a diet rich in calcium. ? Doing weight-bearing and muscle-strengthening exercises. ? Stopping tobacco use. ? Limiting alcohol intake.  Taking medicine to slow the process of bone loss or to increase bone density.  Monitoring your levels of calcium and vitamin D.  Follow these instructions at home:  Include calcium and vitamin D in your diet. Calcium is important for bone health, and vitamin D helps the body absorb calcium.  Perform weight-bearing and muscle-strengthening exercises as directed by your health care  provider.  Do not use any tobacco products, including cigarettes, chewing tobacco, and electronic cigarettes. If you need help quitting, ask your health care provider.  Limit your alcohol intake.  Take medicines only as directed by your health care provider.  Keep all follow-up visits as directed by your health care provider. This is important.  Take precautions at home to lower your risk of falling, such as: ? Keeping rooms well lit and clutter free. ? Installing safety rails on stairs. ? Using rubber mats in the bathroom and other areas that are often wet or slippery. Get help right away if: You fall or injure yourself. This information is not intended to replace advice given to you by your health care provider. Make sure you discuss any questions you have with your health care provider. Document Released: 11/24/2004 Document Revised: 07/20/2015 Document Reviewed: 07/25/2013 Elsevier Interactive Patient Education  2018 Elsevier Inc.  

## 2017-12-14 ENCOUNTER — Telehealth: Payer: Self-pay | Admitting: Obstetrics and Gynecology

## 2017-12-14 ENCOUNTER — Telehealth: Payer: Self-pay

## 2017-12-14 LAB — CALCIUM: Calcium: 8.9 mg/dL (ref 8.7–10.3)

## 2017-12-14 LAB — PHOSPHORUS: Phosphorus: 3.5 mg/dL (ref 2.5–4.5)

## 2017-12-14 LAB — VITAMIN D 25 HYDROXY (VIT D DEFICIENCY, FRACTURES): Vit D, 25-Hydroxy: 23.2 ng/mL — ABNORMAL LOW (ref 30.0–100.0)

## 2017-12-14 NOTE — Telephone Encounter (Signed)
-----   Message from Salvadore Dom, MD sent at 12/13/2017  1:34 PM EDT ----- Please let the patient know that she may be able to get her actonel cheaper with a mail order pharmacy.  Thanks, Sharee Pimple

## 2017-12-14 NOTE — Telephone Encounter (Signed)
Spoke with patient. Patient state that she spoke with her insurance and it will be $90 for Actonel through mail order or local pharmacy. Alendronate or Calcitriol are cheaper per patient. Wondering if these could be an alternative. Advised will review with Dr.Jertson and return call.

## 2017-12-14 NOTE — Telephone Encounter (Signed)
Left message to call Krishiv Sandler at [phone]. 

## 2017-12-14 NOTE — Telephone Encounter (Signed)
Patient returning call to [facility] regarding price of medication.

## 2017-12-14 NOTE — Telephone Encounter (Signed)
Patient calling [name] back with further questions. See prior telephone note.

## 2017-12-14 NOTE — Telephone Encounter (Signed)
Spoke with patient. Patient verbalizes understanding. Encounter closed.

## 2017-12-14 NOTE — Telephone Encounter (Signed)
Yes, please send in fosamax 70 mg, take 1 x a week first thing in the morning on an empty stomach with 8 oz of water. Needs to remain upright for 30 minutes. Call in 12 with 3 refills.

## 2017-12-14 NOTE — Telephone Encounter (Signed)
Left message to call Kaitlyn at [phone]. 

## 2017-12-15 MED ORDER — ALENDRONATE SODIUM 70 MG PO TABS: ORAL_TABLET | ORAL | 3 refills | Status: DC

## 2017-12-15 NOTE — Telephone Encounter (Signed)
Message left to return call to [place] at [phone].   Order [facility]  Need to advise patient and give instructions.

## 2017-12-15 NOTE — Telephone Encounter (Signed)
-----   Message from Salvadore Dom, MD sent at 12/14/2017  4:53 PM EDT ----- Please inform the patient that her vit d is low. She should increase her current vit D 3 intake by 1,000 IU daily, long term. Her other lab work is okay. She can start the Actonel

## 2017-12-15 NOTE — Telephone Encounter (Signed)
Spoke with patient and message from Dr. Talbert Nan given regarding Fosamax. She verbalized understanding of instructions.  Aware of Vitamin D results. Will call back with any questions or concerns.  Encounter closed.

## 2017-12-16 ENCOUNTER — Other Ambulatory Visit: Payer: Self-pay | Admitting: Family Medicine

## 2017-12-16 DIAGNOSIS — I10 Essential (primary) hypertension: Secondary | ICD-10-CM

## 2017-12-21 ENCOUNTER — Other Ambulatory Visit: Payer: Self-pay | Admitting: *Deleted

## 2017-12-21 ENCOUNTER — Telehealth: Payer: Self-pay | Admitting: *Deleted

## 2017-12-21 MED ORDER — LOSARTAN POTASSIUM 25 MG PO TABS: 25.0000 mg | ORAL_TABLET | Freq: Every day | ORAL | 0 refills | Status: DC

## 2017-12-21 NOTE — Telephone Encounter (Signed)
Patient called and still concerned about her skin. States that she saw Dr. Delman Cheadle again, after seeing you-about 2 weeks ago and is using some peel cream and it is not working. She is in fact, getting more places. She cannot help think this is related to the amlodipine-please advise on what to do.

## 2017-12-21 NOTE — Telephone Encounter (Signed)
Patient advised, sent rx to [facility] and patient scheduled for 11/11/9.

## 2017-12-21 NOTE — Telephone Encounter (Signed)
Advise pt--we can stop the amlodipine and change to a different blood pressure medication.  Have her stop the 2.5mg  amlodipine, and change to losartan 25mg .  She needs to schedule f/u in 2 weeks after changing the med (dose may need to be increased, and bloodwork may be needed to monitor kidney tests and potassium.  Okay to rx #30, no refill

## 2018-01-06 NOTE — Progress Notes (Signed)
Chief Complaint  Patient presents with  . Hypertension    follow up and also having some B/L lbp pain and thinks her urine is really yellow.    Patient presents to follow up on hypertension. She called 10/24 upset that her skin wasn't improving, and she really thought it was due to the amlodipine.  Therefore, we had her stop the 2.5mg  of amlodipine, and start losartan 25mg .  She is here for 2 week follow-up since starting the losartan. She has not checked her blood pressure since switching medication.  Denies dizziness.  She has a slight headache on the back of her head on the right--she wonders if is arthritis or related to the weather. No chest pain, shortness of breath. No changes to her skin yet. Still seeing dermatologist, being treated with 5-FU. She remains very embarrassed and upset with the appearance of the skin, how it seemed to start "overnight"   She had a cough that started last night.  She states it is "bronchitis". Inhaler helps.  Has some chest congestion, and some coughing (nonproductive). Using Flovent (BID) and ProAir prn, about 2x/day. No fevers. She just got mucinex yesterday, hasn't taken yet.  She reports she took a hard yoga class earlier, rushing to eat and get here (to explain higher BP, but no other values for comparison on this med).  She also has urinary complaint--she reports she was having to push down to void, had a hard time voiding/emptying.  Then she started having lower back pain, worried she could have a urinary tract infection.  Back pain has resolved. She isn't aware of any prolapse or cystocele that would contribute to bladder issues (she sees GYN). Trouble voiding has also resolved.  Wonders if it could have been related to the [place], when she had increased frequency as well.  Better now.  She complains of feeling sluggish after lunch. Denies eating a lot of sugar/carbs.  Feels like she needs to nap after eating.  Today she pushed through and went to  yoga and felt fine.  Started Savella about 3 weeks ago, felt a little "low", more depressed, was getting up frequently to void, which was affecting her sleep. She took it only for 2.5 weeks, then stopped (due to worsening depression and possible frequency/urinary side effects). She has not communicated this to her pain doctor.  Alendronate was started after her last bone density test by her GYN. Denies heartburn, chest pain, dysphagia. She also had her increase her calcium and D (said her D was a little low)--chart reviewed, level 23.2.  Review of DEXA shows T-2.1 at R femoral neck (was -1.9 in 06/2015).  L wrist was -1.7 (was -1.9 in 06/2015). FRAX score was elevated at 3.6% for hip fracture (normal for major osteoporotic fx, 13.4%).   PMH, PSH, SH reviewed  Outpatient Encounter Medications as of 01/08/2018  Medication Sig Note  . alendronate (FOSAMAX) 70 MG tablet 1 tab po on empty stomach q 7 days with a full glass of water. Remain upright for 30 minutes   . Calcium Carbonate-Vit D-Min (CALCIUM 1200 PO) Take 1,200 mg by mouth daily.   . cholecalciferol (VITAMIN D) 1000 UNITS tablet Take 1,000 Units by mouth daily.    . Cyanocobalamin (B-12) 5000 MCG CAPS Take by mouth daily.   Marland Kitchen HYDROcodone-acetaminophen (NORCO) 10-325 MG tablet Take 0.5 tablets by mouth as needed. 03/21/2017: Takes 1/2 tablet once daily if needed, sometimes 3x/week  . lamoTRIgine (LAMICTAL) 150 MG tablet Take 150 mg by  mouth at bedtime.     Marland Kitchen losartan (COZAAR) 25 MG tablet Take 1 tablet (25 mg total) by mouth daily.   . Multiple Vitamin (MULTIVITAMIN) tablet Take 1 tablet by mouth daily.   . Multiple Vitamins-Minerals (HAIR SKIN AND NAILS FORMULA PO) Take 1 tablet by mouth daily.   Marland Kitchen PROAIR HFA 108 (90 Base) MCG/ACT inhaler Inhale 2 puffs into the lungs every 4 (four) hours as needed for wheezing or shortness of breath. 07/12/2017: Using twice daily for the last 2 weeks  . zolpidem (AMBIEN) 10 MG tablet Take 2.5 mg by mouth at  bedtime as needed.  07/06/2015: Takes 1/4th tablet every night (from psych)  . [DISCONTINUED] SAVELLA 50 MG TABS tablet TAKE 1 TABLET AT BEDTIME FOR 2 WEEKS, THEN INCREASE TO 2 TABLETS AT BEDTIME    No facility-administered encounter medications on file as of 01/08/2018.    Allergies  Allergen Reactions  . Cefdinir     REACTION: rash  . Cymbalta [Duloxetine Hcl]     Abdominal pain  . Nitrofurantoin [Macrodantin]   . Sulfamethoxazole Hives   ROS:  No fever, chills, allergy symptoms.  +cough per HPI. Denies headaches, dizziness, chest pain, edema.  Urinary symptoms and low back pain resolved.  Intermittent fatigue after eating.  Unsure of what may have been SE's from [place], which she recently discontinued.  Has persistent skin discoloration which is causing her embarrassment, and keeps it covered. +chronic pain.  +worsening depression when on the [facility].   PHYSICAL EXAM:  BP (!) 160/90   Pulse 80   Temp 98.9 F (37.2 C) (Tympanic)   Ht 5' 1.75" (1.568 m)   Wt 126 lb (57.2 kg)   LMP 08/15/1988 (Exact Date)   BMI 23.23 kg/m   150/74 on repeat by MD  Well appearing, thin, pleasant female, in yoga clothes. She has full range of affect, but appears mildly depressed overall. Normal hygiene, grooming.  Wearing long sleeves and pants to cover her skin. Multitude of various complaints as in HPI HEENT: conjunctiva and sclera are clear, EOMI. TM's and EAC's normal, nose without drainage, OP clear. Sinuses nontender Neck: no lymphadenopathy or mass Heart: regular rate and rhythm Lungs: clear bilaterally, no wheezes, rales, ronchi Abdomen: soft, nontender, no mass Back: no CVA tenderness. Minimally tender in lower back Extremities: no edema Skin: scattered hyperpigmented and erythematous patches, some hypopigmented areas (that were treated with cryotherapy and healed).   Urine dip: trace blood, o/w normal   ASSESSMENT/PLAN:  Essential hypertension - elevated today, but pain level not  great, not checking elsewhere. To monitor BP over next 10d, contact us w/results to determine if dose needs to be changed  Bilateral low back pain, unspecified chronicity, unspecified whether sciatica present - lower back discomfort resolved, as did most urinary complaints. urine is okay, reassured no infection. - Plan: POCT Urinalysis DIP (Proadvantage Device)  Medication monitoring encounter - Plan: Basic metabolic panel  Osteopenia, unspecified location - worsening, with abnormal FRAX for hip; agree with recommendation to take fosamax, discussed Ca, D, weight-bearing exercise  Cough - no evidence of bacterial infection; continue inhaled steroids, albuterol prn; encouraged use of mucinex. f/u if sx persist/worsen  Chronic neck pain - to discuss issues with Savella with her pain doc.  Consider restarting at lower dose/slower titration  Fatigue, unspecified type - after eating. Discussed diet, limiting carbs/sweets, going for walk after meal  Vitamin D deficiency - agree with increased dose per GYN, continue    Continue the losartan 25mg .  Monitor your blood pressure regularly and record on the sheet of paper given.  Mail/fax/drop off this list within the next 10 days (so we can decide on your refill before you run out).  We will base our decision about whether or not your dose needs to be raised on what those numbers are, not just the one time value in the office today (which was high initially, but improved, but still above goal).  Your urine was normal, no evidence of any infection.  With regard to your sluggishness after meals. Try and eat more fruits/vegetables and protein and limit the carb portions in the meal in order to feel less sluggish.   Sometimes eating a smaller meal, but more frequent snacks can even things out a little (preventing the fatigue after eating).    30 min visit, more than 1/2 spent counseling for her various complaints.

## 2018-01-08 ENCOUNTER — Ambulatory Visit (INDEPENDENT_AMBULATORY_CARE_PROVIDER_SITE_OTHER): Payer: PPO | Admitting: Family Medicine

## 2018-01-08 ENCOUNTER — Encounter: Payer: Self-pay | Admitting: Family Medicine

## 2018-01-08 VITALS — BP 150/74 | HR 80 | Temp 98.9°F | Ht 61.75 in | Wt 126.0 lb

## 2018-01-08 DIAGNOSIS — E559 Vitamin D deficiency, unspecified: Secondary | ICD-10-CM | POA: Diagnosis not present

## 2018-01-08 DIAGNOSIS — R059 Cough, unspecified: Secondary | ICD-10-CM

## 2018-01-08 DIAGNOSIS — R5383 Other fatigue: Secondary | ICD-10-CM

## 2018-01-08 DIAGNOSIS — I10 Essential (primary) hypertension: Secondary | ICD-10-CM

## 2018-01-08 DIAGNOSIS — G8929 Other chronic pain: Secondary | ICD-10-CM

## 2018-01-08 DIAGNOSIS — M545 Low back pain, unspecified: Secondary | ICD-10-CM

## 2018-01-08 DIAGNOSIS — R05 Cough: Secondary | ICD-10-CM

## 2018-01-08 DIAGNOSIS — M542 Cervicalgia: Secondary | ICD-10-CM

## 2018-01-08 DIAGNOSIS — M8580 Other specified disorders of bone density and structure, unspecified site: Secondary | ICD-10-CM | POA: Diagnosis not present

## 2018-01-08 DIAGNOSIS — Z5181 Encounter for therapeutic drug level monitoring: Secondary | ICD-10-CM | POA: Diagnosis not present

## 2018-01-08 LAB — POCT URINALYSIS DIP (PROADVANTAGE DEVICE)
BILIRUBIN UA: NEGATIVE mg/dL
Bilirubin, UA: NEGATIVE
Glucose, UA: NEGATIVE mg/dL
Leukocytes, UA: NEGATIVE
Nitrite, UA: NEGATIVE
PH UA: 6 (ref 5.0–8.0)
PROTEIN UA: NEGATIVE mg/dL
SPECIFIC GRAVITY, URINE: 1.015
Urobilinogen, Ur: NEGATIVE

## 2018-01-08 NOTE — Patient Instructions (Addendum)
  Continue the losartan 25mg .  Monitor your blood pressure regularly and record on the sheet of paper given.  Mail/fax/drop off this list within the next 10 days (so we can decide on your refill before you run out).  We will base our decision about whether or not your dose needs to be raised on what those numbers are, not just the one time value in the office today (which was high initially, but improved, but still above goal).  Your urine was normal, no evidence of any infection.  With regard to your sluggishness after meals. Try and eat more fruits/vegetables and protein and limit the carb portions in the meal in order to feel less sluggish.   Sometimes eating a smaller meal, but more frequent snacks can even things out a little (preventing the fatigue after eating).

## 2018-01-09 LAB — BASIC METABOLIC PANEL
BUN/Creatinine Ratio: 19 (ref 12–28)
BUN: 11 mg/dL (ref 8–27)
CALCIUM: 9.3 mg/dL (ref 8.7–10.3)
CO2: 25 mmol/L (ref 20–29)
CREATININE: 0.59 mg/dL (ref 0.57–1.00)
Chloride: 97 mmol/L (ref 96–106)
GFR calc Af Amer: 104 mL/min/{1.73_m2} (ref >59)
GFR, EST NON AFRICAN AMERICAN: 91 mL/min/{1.73_m2} (ref >59)
GLUCOSE: 95 mg/dL (ref 65–99)
Potassium: 4.6 mmol/L (ref 3.5–5.2)
SODIUM: 137 mmol/L (ref 134–144)

## 2018-01-13 ENCOUNTER — Other Ambulatory Visit: Payer: Self-pay | Admitting: Family Medicine

## 2018-01-18 ENCOUNTER — Telehealth: Payer: Self-pay | Admitting: *Deleted

## 2018-01-18 DIAGNOSIS — I10 Essential (primary) hypertension: Secondary | ICD-10-CM

## 2018-01-18 NOTE — Telephone Encounter (Signed)
Patient stopped by to drop off bp log and I did verify her machine. Reading is on paper and I put in your folder on your shelf.

## 2018-01-22 MED ORDER — LOSARTAN POTASSIUM 50 MG PO TABS: 50.0000 mg | ORAL_TABLET | Freq: Every day | ORAL | 1 refills | Status: DC

## 2018-01-22 NOTE — Telephone Encounter (Signed)
Patient advised.

## 2018-01-22 NOTE — Addendum Note (Signed)
Addended by: [name] on: 01/22/2018 08:46 AM   Modules accepted: Orders

## 2018-01-22 NOTE — Telephone Encounter (Signed)
Advise pt--BP's remain above goal (a few were good, but most were 358-251 systolic).  I'd like for her to increase losartan to 50mg  daily.  Looks like her 25mg  dose was just refilled on 11/18. I sent in a new rx to her pharmacy, since her last rx will only last 2 weeks, please let her know.  Follow up in January as scheduled, sooner if BP's running high or low, or other problems.

## 2018-01-22 NOTE — Telephone Encounter (Signed)
Left message for pt to return my call.

## 2018-01-30 ENCOUNTER — Telehealth: Payer: Self-pay | Admitting: Obstetrics and Gynecology

## 2018-01-30 NOTE — Telephone Encounter (Signed)
I don't think medicare is covering prolia for osteopenia with a high risk of fracture. The guidelines would be to use the fosamax first, prolia would be if the fosamax wasn't tolerated. There has been some new data out on prolia that if you start it, you probably won't be able to come off of it. Typically with a biphsophonate you are on it for a period of time and then come off.  She could switch to actonel 150 mg q month if desired.

## 2018-01-30 NOTE — Telephone Encounter (Signed)
Spoke with patient. Patient asking if Prolia is an appropriate alternative to alendronate PO tab?   Patient denies any side effects of medication, just has hard time remembering to take the medication. Advised I will review with Dr. Talbert Nan and return call with recommendations. Patient agreeable.   Dr. Talbert Nan -please advise on prolia.

## 2018-01-30 NOTE — Telephone Encounter (Signed)
Patient returned call

## 2018-01-30 NOTE — Telephone Encounter (Signed)
Left message to call Recie Cirrincione, RN at [facility] [phone].   

## 2018-01-30 NOTE — Telephone Encounter (Signed)
Spoke with patient, advised as seen below per Dr. Talbert Nan. Patient declines Rx for Actonel 150 mg q month at this time. Patient states she will complete Rx she currently has, will return call to office if she desires new Rx. Patient verbalizes understanding.   Routing to provider for final review. Patient is agreeable to disposition. Will close encounter.

## 2018-01-30 NOTE — Telephone Encounter (Signed)
Patient would like to discuss getting the "injection " for osteopenia.

## 2018-02-05 ENCOUNTER — Telehealth: Payer: Self-pay | Admitting: Obstetrics and Gynecology

## 2018-02-05 ENCOUNTER — Telehealth: Payer: Self-pay | Admitting: *Deleted

## 2018-02-05 NOTE — Telephone Encounter (Signed)
Patient called requesting to speak with the nurse about concerns she has about hair loss. She says she thinks it may be as side effect from the alendronate she is taking.

## 2018-02-05 NOTE — Telephone Encounter (Signed)
Call to patient. Patient states that she has been experiencing hair loss, "so much more than it's usually been." Patient states that when she brushes her hair the brush is full of hair and then when she is shampooing she loses a lot of hair. Patient also experiencing dry skin and constipation. Read that these could possibly be SE of fosamax. Patient states she last took the fosamax yesterday, so not due to take again until 12-15. OV offered to patient to discuss possible SE. Patient agreeable. Patient scheduled for Wednesday 02-07-18 at 1000. Patient agreeable to date and time of appointment.   Routing to provider and will close encounter.

## 2018-02-05 NOTE — Telephone Encounter (Signed)
She had normal thyroid tests in May.   Hair loss is not mentioned as side effect of alendronate or losartan.  Stress and dietary changes can cause (takes about 3 months to see), and hair products can contribute. If she has patches of hair loss, that is alopecia and should be seen by a dermatologist. She should continue her hair, skin, nails vitamin

## 2018-02-05 NOTE — Telephone Encounter (Signed)
Patient advised.

## 2018-02-05 NOTE — Telephone Encounter (Signed)
Patient called and she is losing a lot of hair over the last month. I asked if her skin was dry or having any constipation-she said yes to both. She said she is always cold as well-but that is not new. She was just put on alendronate by Dr Talbert Nan, she put in a call to her earlier today (but wanted to call you as well to see what your thoughts were). Please advise, thanks. She thinks it's her alendronate or losartan.

## 2018-02-06 NOTE — Progress Notes (Deleted)
GYNECOLOGY  VISIT   HPI: 72 y.o.   Married White or Caucasian Not Hispanic or Latino  female   [phone] with Patient's last menstrual period was 08/15/1988 (exact date).   here for     GYNECOLOGIC HISTORY: Patient's last menstrual period was 08/15/1988 (exact date). Contraception:*** Menopausal hormone therapy: ***        OB History    Gravida  3   Para  3   Term  3   Preterm  0   AB  0   Living  3     SAB  0   TAB  0   Ectopic  0   Multiple  0   Live Births  3              Patient Active Problem List   Diagnosis Date Noted  . Vitamin D deficiency 01/08/2018  . Osteopenia 01/08/2018  . Essential hypertension 01/08/2018  . Fatigue 05/23/2016  . Cold intolerance 05/23/2016  . Depressive disorder, not elsewhere classified 11/30/2011  . Spinal stenosis 11/30/2011  . Neck pain on left side 11/30/2011  . ASTHMA 12/05/2007  . COUGH 12/05/2007    Past Medical History:  Diagnosis Date  . Anxiety    sees [facility] [place], [place])  . Colon polyps 08/2014   tubular adenoma and hyperplastic polyps (Dr. Hilarie Fredrickson)  . DDD (degenerative disc disease), cervical dr Patrice Paradise   s/p surgery, ongoing pain  . Depression    h/o hospitalization with ECT  . History of insomnia   . Neck pain   . Osteopenia stopped fosamax ~2006  . Post-operative nausea and vomiting   . Vitamin D deficiency borderline(28)-2010   treated by her GYN    Past Surgical History:  Procedure Laterality Date  . BREAST REDUCTION SURGERY  08/2009  . CATARACT EXTRACTION, BILATERAL Bilateral Sept/October 2019  . HEMORRHOID SURGERY  2006, 12/2014  . KNEE ARTHROSCOPY Right   . LASIK     Dr. Lucita Ferrara  . NASAL SEPTOPLASTY W/ TURBINOPLASTY  1986  . NECK SURGERY  02/2010([place])1/03,2005([place])  . REDUCTION MAMMAPLASTY Bilateral 07/24/2008  . TOTAL HIP ARTHROPLASTY Left 08/2007        Current Outpatient Medications  Medication Sig Dispense Refill  . alendronate (FOSAMAX) 70 MG tablet 1 tab po on  empty stomach q 7 days with a full glass of water. Remain upright for 30 minutes 12 tablet 3  . Calcium Carbonate-Vit D-Min (CALCIUM 1200 PO) Take 1,200 mg by mouth daily.    . cholecalciferol (VITAMIN D) 1000 UNITS tablet Take 1,000 Units by mouth daily.     . Cyanocobalamin (B-12) 5000 MCG CAPS Take by mouth daily.    Marland Kitchen HYDROcodone-acetaminophen (NORCO) 10-325 MG tablet Take 0.5 tablets by mouth as needed.    . lamoTRIgine (LAMICTAL) 150 MG tablet Take 150 mg by mouth at bedtime.      Marland Kitchen losartan (COZAAR) 50 MG tablet Take 1 tablet (50 mg total) by mouth daily. 30 tablet 1  . Multiple Vitamin (MULTIVITAMIN) tablet Take 1 tablet by mouth daily.    . Multiple Vitamins-Minerals (HAIR SKIN AND NAILS FORMULA PO) Take 1 tablet by mouth daily.    Marland Kitchen PROAIR HFA 108 (90 Base) MCG/ACT inhaler Inhale 2 puffs into the lungs every 4 (four) hours as needed for wheezing or shortness of breath. 1 Inhaler 1  . zolpidem (AMBIEN) 10 MG tablet Take 2.5 mg by mouth at bedtime as needed.      No current facility-administered medications  for this visit.      ALLERGIES: Cefdinir; Cymbalta [duloxetine hcl]; Nitrofurantoin [macrodantin]; and Sulfamethoxazole  Family History  Problem Relation Age of Onset  . Arthritis Mother   . Depression Mother   . Pernicious anemia Mother   . Heart disease Father 59       MI  . Depression Brother   . Diabetes Paternal Uncle   . Breast cancer Cousin        paternal    Social History   Socioeconomic History  . Marital status: Married    Spouse name: Not on file  . Number of children: 3  . Years of education: Not on file  . Highest education level: Not on file  Occupational History  . Not on file  Social Needs  . Financial resource strain: Not on file  . Food insecurity:    Worry: Not on file    Inability: Not on file  . Transportation needs:    Medical: Not on file    Non-medical: Not on file  Tobacco Use  . Smoking status: Never Smoker  . Smokeless tobacco:  Never Used  Substance and Sexual Activity  . Alcohol use: Yes    Alcohol/week: 7.0 standard drinks    Types: 7 Glasses of wine per week    Comment: has 1 glass of wine most nights (occasionally 2) or 1/2 martini  . Drug use: No  . Sexual activity: Not Currently    Partners: Male    Birth control/protection: Post-menopausal  Lifestyle  . Physical activity:    Days per week: Not on file    Minutes per session: Not on file  . Stress: Not on file  Relationships  . Social connections:    Talks on phone: Not on file    Gets together: Not on file    Attends religious service: Not on file    Active member of club or organization: Not on file    Attends meetings of clubs or organizations: Not on file    Relationship status: Not on file  . Intimate partner violence:    Fear of current or ex partner: Not on file    Emotionally abused: Not on file    Physically abused: Not on file    Forced sexual activity: Not on file  Other Topics Concern  . Not on file  Social History Narrative   Lives with husband. Has 3 kids, 4 grandchildren. Daughter (Kendra--drug rep, lives here in [place])   Other children live in [place] and [place].    ROS  PHYSICAL EXAMINATION:    LMP 08/15/1988 (Exact Date)     General appearance: alert, cooperative and appears stated age Neck: no adenopathy, supple, symmetrical, trachea midline and thyroid {CHL AMB PHY EX THYROID NORM DEFAULT:[phone]::"normal to inspection and palpation"} Breasts: {Exam; breast:13139::"normal appearance, no masses or tenderness"} Abdomen: soft, non-tender; non distended, no masses,  no organomegaly  Pelvic: External genitalia:  no lesions              Urethra:  normal appearing urethra with no masses, tenderness or lesions              Bartholins and Skenes: normal                 Vagina: normal appearing vagina with normal color and discharge, no lesions              Cervix: {CHL AMB PHY EX CERVIX NORM DEFAULT:[phone]::"no  lesions"}  Bimanual Exam:  Uterus:  {CHL AMB PHY EX UTERUS NORM DEFAULT:[id number]::"normal size, contour, position, consistency, mobility, non-tender"}              Adnexa: {CHL AMB PHY EX ADNEXA NO MASS DEFAULT:[phone]::"no mass, fullness, tenderness"}              Rectovaginal: {yes no:[id number]}.  Confirms.              Anus:  normal sphincter tone, no lesions  Chaperone was present for exam.  ASSESSMENT     PLAN    An After Visit Summary was printed and given to the patient.  *** minutes face to face time of which over 50% was spent in counseling.

## 2018-02-07 ENCOUNTER — Telehealth: Payer: Self-pay | Admitting: Obstetrics and Gynecology

## 2018-02-07 ENCOUNTER — Ambulatory Visit: Payer: PPO | Admitting: Obstetrics and Gynecology

## 2018-02-07 NOTE — Telephone Encounter (Signed)
Patient is sick today and rescheduled appointment to 12/18 at 2:15.

## 2018-02-08 ENCOUNTER — Other Ambulatory Visit: Payer: Self-pay | Admitting: Family Medicine

## 2018-02-08 DIAGNOSIS — I10 Essential (primary) hypertension: Secondary | ICD-10-CM

## 2018-02-08 NOTE — Telephone Encounter (Signed)
Is this okay?

## 2018-02-08 NOTE — Telephone Encounter (Signed)
Yes this is okay--changing only due to backorder of the 50mg  tablets. Be sure that it is clear to the patient with the change in directions, to take TWO

## 2018-02-14 ENCOUNTER — Other Ambulatory Visit: Payer: Self-pay

## 2018-02-14 ENCOUNTER — Ambulatory Visit (INDEPENDENT_AMBULATORY_CARE_PROVIDER_SITE_OTHER): Payer: PPO | Admitting: Obstetrics and Gynecology

## 2018-02-14 ENCOUNTER — Ambulatory Visit: Payer: PPO | Admitting: Obstetrics and Gynecology

## 2018-02-14 ENCOUNTER — Encounter: Payer: Self-pay | Admitting: Obstetrics and Gynecology

## 2018-02-14 VITALS — BP 122/78 | HR 76 | Wt 128.0 lb

## 2018-02-14 DIAGNOSIS — L659 Nonscarring hair loss, unspecified: Secondary | ICD-10-CM | POA: Diagnosis not present

## 2018-02-14 DIAGNOSIS — E559 Vitamin D deficiency, unspecified: Secondary | ICD-10-CM | POA: Diagnosis not present

## 2018-02-14 DIAGNOSIS — M791 Myalgia, unspecified site: Secondary | ICD-10-CM | POA: Diagnosis not present

## 2018-02-14 DIAGNOSIS — M8580 Other specified disorders of bone density and structure, unspecified site: Secondary | ICD-10-CM

## 2018-02-14 NOTE — Patient Instructions (Signed)
Alendronate tablets  What is this medicine?  ALENDRONATE (a LEN droe nate) slows calcium loss from bones. It helps to make normal healthy bone and to slow bone loss in people with Paget's disease and osteoporosis. It may be used in others at risk for bone loss.  This medicine may be used for other purposes; ask your health care provider or pharmacist if you have questions.  COMMON BRAND NAME(S): Fosamax  What should I tell my health care provider before I take this medicine?  They need to know if you have any of these conditions:  -dental disease  -esophagus, stomach, or intestine problems, like acid reflux or GERD  -kidney disease  -low blood calcium  -low vitamin D  -problems sitting or standing 30 minutes  -trouble swallowing  -an unusual or allergic reaction to alendronate, other medicines, foods, dyes, or preservatives  -pregnant or trying to get pregnant  -breast-feeding      How should I use this medicine?  You must take this medicine exactly as directed or you will lower the amount of the medicine you absorb into your body or you may cause yourself harm. Take this medicine by mouth first thing in the morning, after you are up for the day. Do not eat or drink anything before you take your medicine. Swallow the tablet with a full glass (6 to 8 fluid ounces) of plain water. Do not take this medicine with any other drink. Do not chew or crush the tablet. After taking this medicine, do not eat breakfast, drink, or take any medicines or vitamins for at least 30 minutes. Sit or stand up for at least 30 minutes after you take this medicine; do not lie down. Do not take your medicine more often than directed.  Talk to your pediatrician regarding the use of this medicine in children. Special care may be needed.  Overdosage: If you think you have taken too much of this medicine contact a poison control center or emergency room at once.        NOTE: This medicine is only for you. Do not share this medicine with  others.  What if I miss a dose?  If you miss a dose, do not take it later in the day. Continue your normal schedule starting the next morning. Do not take double or extra doses.  What may interact with this medicine?  -aluminum hydroxide  -antacids  -aspirin  -calcium supplements  -drugs for inflammation like ibuprofen, naproxen, and others  -iron supplements  -magnesium supplements  -vitamins with minerals  This list may not describe all possible interactions. Give your health care provider a list of all the medicines, herbs, non-prescription drugs, or dietary supplements you use. Also tell them if you smoke, drink alcohol, or use illegal drugs. Some items may interact with your medicine.  What should I watch for while using this medicine?  Visit your doctor or health care professional for regular checks ups. It may be some time before you see benefit from this medicine. Do not stop taking your medicine except on your doctor's advice. Your doctor or health care professional may order blood tests and other tests to see how you are doing.  You should make sure you get enough calcium and vitamin D while you are taking this medicine, unless your doctor tells you not to. Discuss the foods you eat and the vitamins you take with your health care professional.  Some people who take this medicine have severe bone, joint,   and/or muscle pain. This medicine may also increase your risk for a broken thigh bone. Tell your doctor right away if you have pain in your upper leg or groin. Tell your doctor if you have any pain that does not go away or that gets worse.  This medicine can make you more sensitive to the sun. If you get a rash while taking this medicine, sunlight may cause the rash to get worse. Keep out of the sun. If you cannot avoid being in the sun, wear protective clothing and use sunscreen. Do not use sun lamps or tanning beds/booths.  What side effects may I notice from receiving this medicine?  Side effects that  you should report to your doctor or health care professional as soon as possible:  -allergic reactions like skin rash, itching or hives, swelling of the face, lips, or tongue  -black or tarry stools  -bone, muscle or joint pain  -changes in vision  -chest pain  -heartburn or stomach pain  -jaw pain, especially after dental work  -pain or trouble when swallowing  -redness, blistering, peeling or loosening of the skin, including inside the mouth  Side effects that usually do not require medical attention (report to your doctor or health care professional if they continue or are bothersome):  -changes in taste  -diarrhea or constipation  -eye pain or itching  -headache  -nausea or vomiting  -stomach gas or fullness  This list may not describe all possible side effects. Call your doctor for medical advice about side effects. You may report side effects to FDA at 1-800-FDA-1088.  Where should I keep my medicine?  Keep out of the reach of children.  Store at room temperature of 15 and 30 degrees C (59 and 86 degrees F). Throw away any unused medicine after the expiration date.  NOTE: This sheet is a summary. It may not cover all possible information. If you have questions about this medicine, talk to your doctor, pharmacist, or health care provider.   2019 Elsevier/Gold Standard (2010-08-13 08:56:09)

## 2018-02-14 NOTE — Progress Notes (Signed)
GYNECOLOGY  VISIT   HPI: 72 y.o.   Married White or Caucasian Not Hispanic or Latino  female   [phone] with Patient's last menstrual period was 08/15/1988 (exact date).   here for to discuss hair loss and joint aches since starting Fosamax. Did not take this week. Reports her joint pain is less without taking it.  She also c/o an increase in hair loss, she see's the hair on her clothes, a lot more in her brush. No bald spots. Some constipation.  She really doesn't want to take the Fosamax. She wasn't taking her calcium regularly prior to her last DEXA, she has restarted her calcium and has doubled up on her vit d (was 23 in 10/19).   GYNECOLOGIC HISTORY: Patient's last menstrual period was 08/15/1988 (exact date). Contraception: Postmenopausal Menopausal hormone therapy: None        OB History    Gravida  3   Para  3   Term  3   Preterm  0   AB  0   Living  3     SAB  0   TAB  0   Ectopic  0   Multiple  0   Live Births  3              Patient Active Problem List   Diagnosis Date Noted  . Vitamin D deficiency 01/08/2018  . Osteopenia 01/08/2018  . Essential hypertension 01/08/2018  . Fatigue 05/23/2016  . Cold intolerance 05/23/2016  . Depressive disorder, not elsewhere classified 11/30/2011  . Spinal stenosis 11/30/2011  . Neck pain on left side 11/30/2011  . ASTHMA 12/05/2007  . COUGH 12/05/2007    Past Medical History:  Diagnosis Date  . Anxiety    sees [facility], [place])  . Colon polyps 08/2014   tubular adenoma and hyperplastic polyps (Dr. Hilarie Fredrickson)  . DDD (degenerative disc disease), cervical dr Patrice Paradise   s/p surgery, ongoing pain  . Depression    h/o hospitalization with ECT  . History of insomnia   . Neck pain   . Osteopenia stopped fosamax ~2006  . Post-operative nausea and vomiting   . Vitamin D deficiency borderline(28)-2010   treated by her GYN    Past Surgical History:  Procedure Laterality Date  . BREAST REDUCTION  SURGERY  08/2009  . CATARACT EXTRACTION, BILATERAL Bilateral Sept/October 2019  . HEMORRHOID SURGERY  2006, 12/2014  . KNEE ARTHROSCOPY Right   . LASIK     Dr. Lucita Ferrara  . NASAL SEPTOPLASTY W/ TURBINOPLASTY  1986  . NECK SURGERY  02/2010([facility])1/03,2005([facility])  . REDUCTION MAMMAPLASTY Bilateral 07/24/2008  . TOTAL HIP ARTHROPLASTY Left 08/2007        Current Outpatient Medications  Medication Sig Dispense Refill  . alendronate (FOSAMAX) 70 MG tablet 1 tab po on empty stomach q 7 days with a full glass of water. Remain upright for 30 minutes 12 tablet 3  . Calcium Carbonate-Vit D-Min (CALCIUM 1200 PO) Take 1,200 mg by mouth daily.    . cholecalciferol (VITAMIN D) 1000 UNITS tablet Take 1,000 Units by mouth daily.     . Cyanocobalamin (B-12) 5000 MCG CAPS Take by mouth daily.    Marland Kitchen HYDROcodone-acetaminophen (NORCO) 10-325 MG tablet Take 0.5 tablets by mouth as needed.    . lamoTRIgine (LAMICTAL) 150 MG tablet Take 150 mg by mouth at bedtime.      Marland Kitchen losartan (COZAAR) 25 MG tablet 2 QD 60 tablet 0  . Multiple Vitamin (MULTIVITAMIN) tablet Take  1 tablet by mouth daily.    . Multiple Vitamins-Minerals (HAIR SKIN AND NAILS FORMULA PO) Take 1 tablet by mouth daily.    Marland Kitchen PROAIR HFA 108 (90 Base) MCG/ACT inhaler Inhale 2 puffs into the lungs every 4 (four) hours as needed for wheezing or shortness of breath. 1 Inhaler 1  . zolpidem (AMBIEN) 10 MG tablet Take 2.5 mg by mouth at bedtime as needed.      No current facility-administered medications for this visit.      ALLERGIES: Cefdinir; Cymbalta [duloxetine hcl]; Nitrofurantoin [macrodantin]; and Sulfamethoxazole  Family History  Problem Relation Age of Onset  . Arthritis Mother   . Depression Mother   . Pernicious anemia Mother   . Heart disease Father 12       MI  . Depression Brother   . Diabetes Paternal Uncle   . Breast cancer Cousin        paternal    Social History   Socioeconomic History  . Marital status: Married    Spouse  name: Not on file  . Number of children: 3  . Years of education: Not on file  . Highest education level: Not on file  Occupational History  . Not on file  Social Needs  . Financial resource strain: Not on file  . Food insecurity:    Worry: Not on file    Inability: Not on file  . Transportation needs:    Medical: Not on file    Non-medical: Not on file  Tobacco Use  . Smoking status: Never Smoker  . Smokeless tobacco: Never Used  Substance and Sexual Activity  . Alcohol use: Yes    Alcohol/week: 7.0 standard drinks    Types: 7 Glasses of wine per week    Comment: has 1 glass of wine most nights (occasionally 2) or 1/2 martini  . Drug use: No  . Sexual activity: Not Currently    Partners: Male    Birth control/protection: Post-menopausal  Lifestyle  . Physical activity:    Days per week: Not on file    Minutes per session: Not on file  . Stress: Not on file  Relationships  . Social connections:    Talks on phone: Not on file    Gets together: Not on file    Attends religious service: Not on file    Active member of club or organization: Not on file    Attends meetings of clubs or organizations: Not on file    Relationship status: Not on file  . Intimate partner violence:    Fear of current or ex partner: Not on file    Emotionally abused: Not on file    Physically abused: Not on file    Forced sexual activity: Not on file  Other Topics Concern  . Not on file  Social History Narrative   Lives with husband. Has 3 kids, 4 grandchildren. Daughter (Kendra--drug rep, lives here in [place])   Other children live in [place] and [place].    Review of Systems  Constitutional: Negative.   HENT: Negative.   Eyes: Negative.   Respiratory: Negative.   Cardiovascular: Negative.   Gastrointestinal: Negative.   Genitourinary: Negative.   Musculoskeletal: Positive for joint pain.  Skin:       Hair loss  Neurological: Negative.   Endo/Heme/Allergies: Negative.    Psychiatric/Behavioral: Negative.     PHYSICAL EXAMINATION:    BP 122/78 (BP Location: Right Arm, Patient Position: Sitting, Cuff Size: Normal)   Pulse  76   Wt 128 lb (58.1 kg)   LMP 08/15/1988 (Exact Date)   BMI 23.60 kg/m     General appearance: alert, cooperative and appears stated age  ASSESSMENT Osteopenia, with elevated risk of fracture Hair loss Muscle aches and constipation Vit d def, has been on a higher dose of vit d    PLAN Discussed the reason for the Fosamax. Reviewed that Fosamax can be associated with muscle aches, <1% risk of alopecia Will check TSH and vit D She will discuss hair loss with her dermatologist, has an appointment next week Discussed option of Evista, including risks She will continue on the calcium and vit d, recommended regular exercise Information given on the Fosamax For the moment she will stay off of it, will await lab work    An After Visit Summary was printed and given to the patient.

## 2018-02-14 NOTE — Progress Notes (Deleted)
GYNECOLOGY  VISIT   HPI: 72 y.o.   Married White or Caucasian Not Hispanic or Latino  female   [phone] with Patient's last menstrual period was 08/15/1988 (exact date).   here for     GYNECOLOGIC HISTORY: Patient's last menstrual period was 08/15/1988 (exact date). Contraception:*** Menopausal hormone therapy: ***        OB History    Gravida  3   Para  3   Term  3   Preterm  0   AB  0   Living  3     SAB  0   TAB  0   Ectopic  0   Multiple  0   Live Births  3              Patient Active Problem List   Diagnosis Date Noted  . Vitamin D deficiency 01/08/2018  . Osteopenia 01/08/2018  . Essential hypertension 01/08/2018  . Fatigue 05/23/2016  . Cold intolerance 05/23/2016  . Depressive disorder, not elsewhere classified 11/30/2011  . Spinal stenosis 11/30/2011  . Neck pain on left side 11/30/2011  . ASTHMA 12/05/2007  . COUGH 12/05/2007    Past Medical History:  Diagnosis Date  . Anxiety    sees [facility], [place])  . Colon polyps 08/2014   tubular adenoma and hyperplastic polyps (Dr. Hilarie Fredrickson)  . DDD (degenerative disc disease), cervical dr Patrice Paradise   s/p surgery, ongoing pain  . Depression    h/o hospitalization with ECT  . History of insomnia   . Neck pain   . Osteopenia stopped fosamax ~2006  . Post-operative nausea and vomiting   . Vitamin D deficiency borderline(28)-2010   treated by her GYN    Past Surgical History:  Procedure Laterality Date  . BREAST REDUCTION SURGERY  08/2009  . CATARACT EXTRACTION, BILATERAL Bilateral Sept/October 2019  . HEMORRHOID SURGERY  2006, 12/2014  . KNEE ARTHROSCOPY Right   . LASIK     Dr. Lucita Ferrara  . NASAL SEPTOPLASTY W/ TURBINOPLASTY  1986  . NECK SURGERY  02/2010([facility])1/03,2005([facility])  . REDUCTION MAMMAPLASTY Bilateral 07/24/2008  . TOTAL HIP ARTHROPLASTY Left 08/2007        Current Outpatient Medications  Medication Sig Dispense Refill  . alendronate (FOSAMAX) 70 MG tablet 1 tab po on  empty stomach q 7 days with a full glass of water. Remain upright for 30 minutes 12 tablet 3  . Calcium Carbonate-Vit D-Min (CALCIUM 1200 PO) Take 1,200 mg by mouth daily.    . cholecalciferol (VITAMIN D) 1000 UNITS tablet Take 1,000 Units by mouth daily.     . Cyanocobalamin (B-12) 5000 MCG CAPS Take by mouth daily.    Marland Kitchen HYDROcodone-acetaminophen (NORCO) 10-325 MG tablet Take 0.5 tablets by mouth as needed.    . lamoTRIgine (LAMICTAL) 150 MG tablet Take 150 mg by mouth at bedtime.      Marland Kitchen losartan (COZAAR) 25 MG tablet 2 QD 60 tablet 0  . Multiple Vitamin (MULTIVITAMIN) tablet Take 1 tablet by mouth daily.    . Multiple Vitamins-Minerals (HAIR SKIN AND NAILS FORMULA PO) Take 1 tablet by mouth daily.    Marland Kitchen PROAIR HFA 108 (90 Base) MCG/ACT inhaler Inhale 2 puffs into the lungs every 4 (four) hours as needed for wheezing or shortness of breath. 1 Inhaler 1  . zolpidem (AMBIEN) 10 MG tablet Take 2.5 mg by mouth at bedtime as needed.      No current facility-administered medications for this visit.  ALLERGIES: Cefdinir; Cymbalta [duloxetine hcl]; Nitrofurantoin [macrodantin]; and Sulfamethoxazole  Family History  Problem Relation Age of Onset  . Arthritis Mother   . Depression Mother   . Pernicious anemia Mother   . Heart disease Father 24       MI  . Depression Brother   . Diabetes Paternal Uncle   . Breast cancer Cousin        paternal    Social History   Socioeconomic History  . Marital status: Married    Spouse name: Not on file  . Number of children: 3  . Years of education: Not on file  . Highest education level: Not on file  Occupational History  . Not on file  Social Needs  . Financial resource strain: Not on file  . Food insecurity:    Worry: Not on file    Inability: Not on file  . Transportation needs:    Medical: Not on file    Non-medical: Not on file  Tobacco Use  . Smoking status: Never Smoker  . Smokeless tobacco: Never Used  Substance and Sexual  Activity  . Alcohol use: Yes    Alcohol/week: 7.0 standard drinks    Types: 7 Glasses of wine per week    Comment: has 1 glass of wine most nights (occasionally 2) or 1/2 martini  . Drug use: No  . Sexual activity: Not Currently    Partners: Male    Birth control/protection: Post-menopausal  Lifestyle  . Physical activity:    Days per week: Not on file    Minutes per session: Not on file  . Stress: Not on file  Relationships  . Social connections:    Talks on phone: Not on file    Gets together: Not on file    Attends religious service: Not on file    Active member of club or organization: Not on file    Attends meetings of clubs or organizations: Not on file    Relationship status: Not on file  . Intimate partner violence:    Fear of current or ex partner: Not on file    Emotionally abused: Not on file    Physically abused: Not on file    Forced sexual activity: Not on file  Other Topics Concern  . Not on file  Social History Narrative   Lives with husband. Has 3 kids, 4 grandchildren. Daughter (Kendra--drug rep, lives here in [place])   Other children live in [place] and [place].    ROS  PHYSICAL EXAMINATION:    LMP 08/15/1988 (Exact Date)     General appearance: alert, cooperative and appears stated age Neck: no adenopathy, supple, symmetrical, trachea midline and thyroid {CHL AMB PHY EX THYROID NORM [phone]::"normal to inspection and palpation"} Breasts: {Exam; breast:13139::"normal appearance, no masses or tenderness"} Abdomen: soft, non-tender; non distended, no masses,  no organomegaly  Pelvic: External genitalia:  no lesions              Urethra:  normal appearing urethra with no masses, tenderness or lesions              Bartholins and Skenes: normal                 Vagina: normal appearing vagina with normal color and discharge, no lesions              Cervix: {CHL AMB PHY EX CERVIX NORM DEFAULT:[phone]::"no lesions"}  Bimanual  Exam:  Uterus:  {CHL AMB PHY EX UTERUS NORM DEFAULT:[id number]::"normal size, contour, position, consistency, mobility, non-tender"}              Adnexa: {CHL AMB PHY EX ADNEXA NO MASS DEFAULT:[phone]::"no mass, fullness, tenderness"}              Rectovaginal: {yes no:[id number]}.  Confirms.              Anus:  normal sphincter tone, no lesions  Chaperone was present for exam.  ASSESSMENT     PLAN    An After Visit Summary was printed and given to the patient.  *** minutes face to face time of which over 50% was spent in counseling.

## 2018-02-15 ENCOUNTER — Telehealth: Payer: Self-pay | Admitting: Obstetrics and Gynecology

## 2018-02-15 ENCOUNTER — Telehealth: Payer: Self-pay

## 2018-02-15 LAB — TSH: TSH: 1.27 u[IU]/mL (ref 0.450–4.500)

## 2018-02-15 LAB — VITAMIN D 25 HYDROXY (VIT D DEFICIENCY, FRACTURES): VIT D 25 HYDROXY: 25.1 ng/mL — AB (ref 30.0–100.0)

## 2018-02-15 NOTE — Telephone Encounter (Signed)
Left message to call Pearlene Teat at [phone]. 

## 2018-02-15 NOTE — Telephone Encounter (Signed)
Other than I biphosphonate, she could try evista. We talked about Evista which is a SERM, increased risk of clotting, hot flashes.

## 2018-02-15 NOTE — Telephone Encounter (Signed)
Left message to call Shelbey Spindler at [phone]. 

## 2018-02-15 NOTE — Telephone Encounter (Signed)
Please see telephone encounter from earlier today. Encounter closed.

## 2018-02-15 NOTE — Telephone Encounter (Signed)
Patient returning Kaitlyn's call. °

## 2018-02-15 NOTE — Telephone Encounter (Signed)
Spoke with patient. Advised of message as seen below from [place]. Patient verbalizes understanding. Would like to think about this more and return call if she would like to try Evista. Encounter closed.

## 2018-02-15 NOTE — Telephone Encounter (Signed)
-----   Message from Salvadore Dom, MD sent at 02/15/2018 10:42 AM EST ----- Please let her know that her TSH is normal. Her vit d level has had minimal improvement with her taking 2,000 IU of vit d daily (she doubled her dose after her last blood draw. I would recommend that she increase her vit d to 5,000 IU daily and recheck her vit d level in 3 months.

## 2018-02-15 NOTE — Telephone Encounter (Signed)
Patient is returning call.  °

## 2018-02-15 NOTE — Telephone Encounter (Signed)
Spoke with patient. Advised of results as seen below from [place]. Patient verbalizes understanding. Lab recheck appointment scheduled for 05/17/2018 at 1:30 pm. Patient is agreeable to date and time. Patient is asking if there is an alternative medication she could try other than Fosamax due to hair loss. Advised will review with Dr.Jertson and return call.

## 2018-02-23 ENCOUNTER — Other Ambulatory Visit: Payer: Self-pay | Admitting: Family Medicine

## 2018-02-23 DIAGNOSIS — I10 Essential (primary) hypertension: Secondary | ICD-10-CM

## 2018-02-26 ENCOUNTER — Telehealth: Payer: Self-pay | Admitting: Obstetrics and Gynecology

## 2018-02-26 NOTE — Telephone Encounter (Signed)
Patient calling with question regarding medication. States Dr. Talbert Nan told her to up her Vitamin D, but she cannot remember how much she is supposed to take.

## 2018-02-26 NOTE — Telephone Encounter (Signed)
Per review of labs dated 02/14/18, increase Vit D 5,000 IU daily, repeat in 3 months.   Call returned to patient, advised as seen above, questions answered. Patient verbalizes understanding and is agreeable.  Encounter closed.

## 2018-03-02 ENCOUNTER — Telehealth: Payer: Self-pay | Admitting: Family Medicine

## 2018-03-02 NOTE — Telephone Encounter (Signed)
   Fax refill request from [facility]   Losartan Potassium 25mg  #60 Last filled 02/11/18

## 2018-03-02 NOTE — Telephone Encounter (Signed)
She has appt 1/6. See if she has enough med to last until Monday's appt. We would NOT be refilling the 25mg --that was changed due to backorder of the 50mg  when she went to get her last refill. If she doesn't have enough, okay to refill #30 of 50mg  tablets, 1 po qd.  (if still on backorder, then okay to fill this 25mg  rx, taking 2qd)

## 2018-03-02 NOTE — Telephone Encounter (Signed)
Patient does have enough of medication to last until her appointment on Monday.    She was notified of her appointment time also.

## 2018-03-04 NOTE — Progress Notes (Signed)
Chief Complaint  Patient presents with  . Hypertension    nonfasting med check. Patient still having issues with hairloss.     Patient presents for follow up on hypertension. Losartan dose was increased from 25mg  to 50mg  in late November, due to BP's being above goal.  BP's have been running in the low 140's. Review of BP list shows 133-157/83-91.    She finds that it makes her tired if taken in the morning, causes insomnia if taken at bedtime, so taking at 3-4 in the afternoon, and feels good. Denies headaches or dizziness or shortness of breath.  No edema. No change to the rash on the legs.  Since her last visit here, she has seen her GYN.  Vitamin D level was noted to be low, and her dose was recently increased to 5000 IU daily.  She had been complaining of hair loss (TSH was checked and was normal), that she is also going to discuss with her dermatologist (appointment is tomorrow).   She had told her GYN that she didn't want to continue to take Fosamax. Some of her joint pains improved after stopping it.  They had discussed Evista as an alternative. She is not currently taking any bone-building medications.  DEXA showed T-2.1 at R femoral neck (was -1.9 in 06/2015).  L wrist was -1.7 (was -1.9 in 06/2015). FRAX score was elevated at 3.6% for hip fracture (normal for major osteoporotic fx, 13.4%).   She stopped the alendronate because of the hair loss, hasn't changed. Continues to lose a lot of hair daily. She started the losartan around the same time as the alendronate, and isn't sure which is causing her hair loss, but feels is since starting these two medications. This is bothering her tremendously.  She feels like her hair has gotten somewhat thinner.  There has been no change to her skin (the reason that her amlodipine was stopped). She is willing to retry the amlodipine (recognizing that it likely isn't the cause of the "rash").  PMH, PSH, SH reviewed  Outpatient Encounter Medications  as of 03/05/2018  Medication Sig Note  . Calcium Carbonate-Vit D-Min (CALCIUM 1200 PO) Take 2,400 mg by mouth daily.    . Cyanocobalamin (B-12) 5000 MCG CAPS Take by mouth daily.   Marland Kitchen HYDROcodone-acetaminophen (NORCO) 10-325 MG tablet Take 0.5 tablets by mouth as needed. 03/21/2017: Takes 1/2 tablet once daily if needed, sometimes 3x/week  . lamoTRIgine (LAMICTAL) 150 MG tablet Take 150 mg by mouth at bedtime.     . Multiple Vitamin (MULTIVITAMIN) tablet Take 1 tablet by mouth daily.   . Multiple Vitamins-Minerals (HAIR SKIN AND NAILS FORMULA PO) Take 1 tablet by mouth daily.   Marland Kitchen PROAIR HFA 108 (90 Base) MCG/ACT inhaler Inhale 2 puffs into the lungs every 4 (four) hours as needed for wheezing or shortness of breath. 07/12/2017: Using twice daily for the last 2 weeks  . VITAMIN D PO Take 5,000 Int'l Units by mouth daily.   Marland Kitchen zolpidem (AMBIEN) 10 MG tablet Take 2.5 mg by mouth at bedtime as needed.  07/06/2015: Takes 1/4th tablet every night (from psych)  . [DISCONTINUED] losartan (COZAAR) 25 MG tablet 2 QD   . alendronate (FOSAMAX) 70 MG tablet 1 tab po on empty stomach q 7 days with a full glass of water. Remain upright for 30 minutes (Patient not taking: Reported on 03/05/2018)   . amLODipine (NORVASC) 5 MG tablet Take 1 tablet (5 mg total) by mouth daily.   . [DISCONTINUED]  cholecalciferol (VITAMIN D) 1000 UNITS tablet Take 1,000 Units by mouth daily.     No facility-administered encounter medications on file as of 03/05/2018.    (taking 50mg  of losartan prior to today's visit, NOT taking amlodipine)  Allergies  Allergen Reactions  . Cefdinir     REACTION: rash  . Cymbalta [Duloxetine Hcl]     Abdominal pain  . Nitrofurantoin [Macrodantin]   . Sulfamethoxazole Hives   ROS:  No headache, dizziness, chest pain, shortness of breath.  No fever, chills, URI symptoms.  +chronic pain, hair loss per HPI.   PHYSICAL EXAM:  BP (!) 142/82   Pulse 80   Ht 5' 1.75" (1.568 m)   Wt 128 lb (58.1 kg)    LMP 08/15/1988 (Exact Date)   BMI 23.60 kg/m   140/72 on repeat by MD Reports pain score of 5-6/10  Well appearing pleasant female in no distress.  Appeears somewhat anxious today, and in mild discomfort from neck pain.  Anxiety is related to her hair loss. HEENT: conjunctiva and sclera are clear, EOMI. Scalp: normal, no inflammation, flaking or areas of alopecia.  Some hair noted on her clothing (a few pieces) Neck: no lymphadenopathy or mass Heart: regular rate and rhythm Lungs: clear bilaterally Extremities: no edema Skin: normal turgor. No changes to discoloration/actinic changes of skin. No bruising Psych: normal hygiene, grooming, eye contact, speech.  Somewhat anxious. Full range of affect, overall in good spirits.   ASSESSMENT/PLAN:  Essential hypertension - borderline control (pain/anxiety contributing factor) on losartan 50mg . Wants to d/c med due to hair loss. Change back to amlodipine--start at 5mg  - Plan: amLODipine (NORVASC) 5 MG tablet  Hair loss - ddx reviewed in detail; plans to discuss further with her derm tomorrow. Cont hair/skin/nail vitamins. Stop losartan, and stay off alendronate for now  25 min visit, more than 1/2 spent counseling (regarding blood pressure, medications, hair loss).  F/u 6-8 weeks with list of BP's, sooner prn.   Stop the losartan, and instead start taking amlodipine.  Your previous dose was 2.5 mg daily--I'm increasing it to 5mg . If you have any old tablets at home, take 2 tonight.  The new prescription will be for 5mg --only take 1 every evening.  If you blood pressure drops below 110 and you feel dizzy or weak, then cut back the dose to 1/2 tablet and let us know.  Continue to monitor your blood pressure and return in 6-8 weeks with your list.  Discuss your hair loss concerns with the dermatologist.  Your thyroid test was normal. Since you feel it started the same time as you starting the losartan and alendronate, we will stop the losartan  and change you back to the amlodipine (since you agree that the skin hasn't improved since being off of it).  Please know that the improvement in hair loss can take weeks to months to see, it won't be better next week. Continue hair/skin/nail vitamin daily.

## 2018-03-05 ENCOUNTER — Encounter: Payer: Self-pay | Admitting: Family Medicine

## 2018-03-05 ENCOUNTER — Ambulatory Visit (INDEPENDENT_AMBULATORY_CARE_PROVIDER_SITE_OTHER): Payer: PPO | Admitting: Family Medicine

## 2018-03-05 VITALS — BP 140/72 | HR 80 | Ht 61.75 in | Wt 128.0 lb

## 2018-03-05 DIAGNOSIS — L659 Nonscarring hair loss, unspecified: Secondary | ICD-10-CM | POA: Diagnosis not present

## 2018-03-05 DIAGNOSIS — I10 Essential (primary) hypertension: Secondary | ICD-10-CM | POA: Diagnosis not present

## 2018-03-05 MED ORDER — AMLODIPINE BESYLATE 5 MG PO TABS: 5.0000 mg | ORAL_TABLET | Freq: Every day | ORAL | 2 refills | Status: DC

## 2018-03-05 NOTE — Patient Instructions (Signed)
  Stop the losartan, and instead start taking amlodipine.  Your previous dose was 2.5 mg daily--I'm increasing it to 5mg . If you have any old tablets at home, take 2 tonight.  The new prescription will be for 5mg --only take 1 every evening.  If you blood pressure drops below 110 and you feel dizzy or weak, then cut back the dose to 1/2 tablet and let us know.  Continue to monitor your blood pressure and return in 6-8 weeks with your list.  Discuss your hair loss concerns with the dermatologist.  Your thyroid test was normal. Since you feel it started the same time as you starting the losartan and alendronate, we will stop the losartan and change you back to the amlodipine (since you agree that the skin hasn't improved since being off of it).  Please know that the improvement in hair loss can take weeks to months to see, it won't be better next week. Continue hair/skin/nail vitamin daily.

## 2018-03-07 DIAGNOSIS — L565 Disseminated superficial actinic porokeratosis (DSAP): Secondary | ICD-10-CM | POA: Diagnosis not present

## 2018-03-07 DIAGNOSIS — L821 Other seborrheic keratosis: Secondary | ICD-10-CM | POA: Diagnosis not present

## 2018-03-07 DIAGNOSIS — L659 Nonscarring hair loss, unspecified: Secondary | ICD-10-CM | POA: Diagnosis not present

## 2018-03-28 DIAGNOSIS — J3089 Other allergic rhinitis: Secondary | ICD-10-CM | POA: Diagnosis not present

## 2018-03-28 DIAGNOSIS — K219 Gastro-esophageal reflux disease without esophagitis: Secondary | ICD-10-CM | POA: Diagnosis not present

## 2018-03-28 DIAGNOSIS — R062 Wheezing: Secondary | ICD-10-CM | POA: Diagnosis not present

## 2018-03-30 DIAGNOSIS — M19012 Primary osteoarthritis, left shoulder: Secondary | ICD-10-CM | POA: Diagnosis not present

## 2018-04-17 DIAGNOSIS — L821 Other seborrheic keratosis: Secondary | ICD-10-CM | POA: Diagnosis not present

## 2018-04-17 DIAGNOSIS — B078 Other viral warts: Secondary | ICD-10-CM | POA: Diagnosis not present

## 2018-04-17 DIAGNOSIS — Z23 Encounter for immunization: Secondary | ICD-10-CM | POA: Diagnosis not present

## 2018-04-29 NOTE — Progress Notes (Signed)
Chief Complaint  Patient presents with  . Hypertension    follow up on hypertension.    Patient presents for follow-up on hypertension.  At her visit in January her BP's were only under borderline control (pain/anxiety may have contributed) on 64m losartan--she wanted to stop the medication because she felt it might be contributing to hair loss.  She was changed back to amlodipine (she had been changed to losartan from amlodipine after she developed skin condition that patient attributed to the amlodipine, but later was unsure, since the skin didn't improve at all after stopping).  She was changed back to amlodipine, but at 536mdose (originally rx'd 2.65m58m    She has been taking amlodipine 65mg12mily. She denies edema or other side effects. BP's have been running 117-140/70-80, mostly low 120's/75. She sometimes has some leg cramps at night--a squirt of mustard has helped. Having muscle cramps over the last 6 months, about once a week. She started an OTC potassium supplement 3 days ago thinking it might help. She previously used the white soap under the sheets, can't recall, thinks it helped, forgot about this, hasn't tried recently.  After yoga, she eats 30-40 minutes later, eating salads or sandwich.  She feels very drained about 1.5-2 hours after eating.  Vitamin D was noted to be low in December at her GYN, dose was increased (OTC dose).  She is using Nioxin.  Hair is no longer falling out.  Was told by derm that her scalp is healthy.  Hair loss is much better than at last visit.  No changes to her skin--"all came on one day", no changes. She is still very self conscious and wears long sleeves and leggings to keep skin covered.  Complaining of some pain at her right upper arm/elbow. She feels like she can see a raised red ridge.  Denies any trauma or injury.  She was reassured by her dermatologist, but wants my opinion also.  PMH, PSH, SH reviewed  Outpatient Encounter Medications as  of 04/30/2018  Medication Sig Note  . amLODipine (NORVASC) 5 MG tablet Take 1 tablet (5 mg total) by mouth daily.   . budesonide-formoterol (SYMBICORT) 160-4.5 MCG/ACT inhaler Inhale 2 puffs into the lungs 2 (two) times daily.   . Calcium Carbonate-Vit D-Min (CALCIUM 1200 PO) Take 2,400 mg by mouth daily.    . Cyanocobalamin (B-12) 5000 MCG CAPS Take by mouth daily.   . HYMarland KitchenROcodone-acetaminophen (NORCO) 10-325 MG tablet Take 0.5 tablets by mouth as needed. 03/21/2017: Takes 1/2 tablet once daily if needed, sometimes 3x/week  . lamoTRIgine (LAMICTAL) 150 MG tablet Take 150 mg by mouth at bedtime.     . Multiple Vitamin (MULTIVITAMIN) tablet Take 1 tablet by mouth daily.   . Multiple Vitamins-Minerals (HAIR SKIN AND NAILS FORMULA PO) Take 1 tablet by mouth daily.   . Potassium 75 MG TABS Take 1 tablet by mouth daily.   . PRMarland KitchenAIR HFA 108 (90 Base) MCG/ACT inhaler Inhale 2 puffs into the lungs every 4 (four) hours as needed for wheezing or shortness of breath. 07/12/2017: Using twice daily for the last 2 weeks  . VITAMIN D PO Take 5,000 Int'l Units by mouth daily.   . zoMarland Kitchenpidem (AMBIEN) 10 MG tablet Take 2.5 mg by mouth at bedtime as needed.  07/06/2015: Takes 1/4th tablet every night (from psych)  . [DISCONTINUED] FLOVENT HFA 110 MCG/ACT inhaler    . alendronate (FOSAMAX) 70 MG tablet 1 tab po on empty stomach q 7 days with a  full glass of water. Remain upright for 30 minutes (Patient not taking: Reported on 03/05/2018)   . [DISCONTINUED] amLODipine (NORVASC) 2.5 MG tablet Take 2.5 mg by mouth daily.    No facility-administered encounter medications on file as of 04/30/2018.    Allergies  Allergen Reactions  . Cefdinir     REACTION: rash  . Cymbalta [Duloxetine Hcl]     Abdominal pain  . Nitrofurantoin [Macrodantin]   . Sulfamethoxazole Hives   ROS: No fever, chills, headaches, dizziness, chest pain, shortness of breath, edema, bleeding, bruising.  No urinary complaints. No URI symptoms, cough. See  HPI--fatigue afternoons, right arm pain, muscle cramps.   PHYSICAL EXAM:  BP 130/74   Pulse 84   Ht 5' 1.75" (1.568 m)   Wt 125 lb 12.8 oz (57.1 kg)   LMP 08/15/1988 (Exact Date)   BMI 23.20 kg/m    Wt Readings from Last 3 Encounters:  04/30/18 125 lb 12.8 oz (57.1 kg)  03/05/18 128 lb (58.1 kg)  02/14/18 128 lb (58.1 kg)   Well appearing, pleasant female in no distress HEENT: conjunctiva and sclera are clear, EOMI Neck no lymphadenopathy or mass Heart: regular rate and rhythm Lungs: clear bilaterally Skin: scattered hyperpigmented macules throughout arms and legs. Extremities: no edema. Tender over right distal triceps. No pain with stretch. No erythema, skin lesions, no warmth, swelling, or other abnormality noted in this area  ASSESSMENT/PLAN:  Essential hypertension - well controlled; continue 5 mg amlodipine - Plan: Comprehensive metabolic panel, amLODipine (NORVASC) 5 MG tablet  Muscle cramps - increase fluid intake. Check labs--if K+ elevated, will need to stop supplement - Plan: Comprehensive metabolic panel, Magnesium  Right arm pain  Fatigue, unspecified type - discussed possible causes and proper diet - Plan: CBC with Differential/Platelet  Essential hypertension - Plan: Comprehensive metabolic panel, amLODipine (NORVASC) 5 MG tablet    c-met, Mg, cBC  Make sure that you are drinking more water earlier in the day (at least 8-12 cups). This can help prevent muscle cramps. Be sure that you are getting enough protein with your lunch.  Okay to have bread/carbs, but avoid other sweets or high sugar drinks (like juice, sweet tea, regular soda).  We are checking your electrolytes today, to see if you truly need any potassium supplement (if the level is on the high side, it could be more dangerous to take the vitamin).

## 2018-04-30 ENCOUNTER — Encounter: Payer: Self-pay | Admitting: Family Medicine

## 2018-04-30 ENCOUNTER — Ambulatory Visit (INDEPENDENT_AMBULATORY_CARE_PROVIDER_SITE_OTHER): Payer: PPO | Admitting: Family Medicine

## 2018-04-30 VITALS — BP 130/74 | HR 84 | Ht 61.75 in | Wt 125.8 lb

## 2018-04-30 DIAGNOSIS — R5383 Other fatigue: Secondary | ICD-10-CM

## 2018-04-30 DIAGNOSIS — M79601 Pain in right arm: Secondary | ICD-10-CM

## 2018-04-30 DIAGNOSIS — I10 Essential (primary) hypertension: Secondary | ICD-10-CM

## 2018-04-30 DIAGNOSIS — R252 Cramp and spasm: Secondary | ICD-10-CM | POA: Diagnosis not present

## 2018-04-30 MED ORDER — AMLODIPINE BESYLATE 5 MG PO TABS: 5.0000 mg | ORAL_TABLET | Freq: Every day | ORAL | 1 refills | Status: DC

## 2018-04-30 NOTE — Patient Instructions (Signed)
  Make sure that you are drinking more water earlier in the day (at least 8-12 cups). This can help prevent muscle cramps. Be sure that you are getting enough protein with your lunch.  Okay to have bread/carbs, but avoid other sweets or high sugar drinks (like juice, sweet tea, regular soda).  We are checking your electrolytes today, to see if you truly need any potassium supplement (if the level is on the high side, it could be more dangerous to take the vitamin).

## 2018-05-01 DIAGNOSIS — M25521 Pain in right elbow: Secondary | ICD-10-CM | POA: Diagnosis not present

## 2018-05-01 LAB — CBC WITH DIFFERENTIAL/PLATELET
BASOS: 0 %
Basophils Absolute: 0 10*3/uL (ref 0.0–0.2)
EOS (ABSOLUTE): 0.1 10*3/uL (ref 0.0–0.4)
Eos: 2 %
Hematocrit: 36.2 % (ref 34.0–46.6)
Hemoglobin: 12.5 g/dL (ref 11.1–15.9)
IMMATURE GRANULOCYTES: 0 %
Immature Grans (Abs): 0 10*3/uL (ref 0.0–0.1)
Lymphocytes Absolute: 1.7 10*3/uL (ref 0.7–3.1)
Lymphs: 24 %
MCH: 29.9 pg (ref 26.6–33.0)
MCHC: 34.5 g/dL (ref 31.5–35.7)
MCV: 87 fL (ref 79–97)
MONOS ABS: 0.5 10*3/uL (ref 0.1–0.9)
Monocytes: 7 %
NEUTROS PCT: 67 %
Neutrophils Absolute: 4.7 10*3/uL (ref 1.4–7.0)
PLATELETS: 357 10*3/uL (ref 150–450)
RBC: 4.18 x10E6/uL (ref 3.77–5.28)
RDW: 12.5 % (ref 11.7–15.4)
WBC: 6.9 10*3/uL (ref 3.4–10.8)

## 2018-05-01 LAB — COMPREHENSIVE METABOLIC PANEL
A/G RATIO: 2.3 — AB (ref 1.2–2.2)
ALK PHOS: 73 IU/L (ref 39–117)
ALT: 19 IU/L (ref 0–32)
AST: 20 IU/L (ref 0–40)
Albumin: 4.3 g/dL (ref 3.7–4.7)
BUN/Creatinine Ratio: 25 (ref 12–28)
BUN: 17 mg/dL (ref 8–27)
Bilirubin Total: <0.2 mg/dL (ref 0.0–1.2)
CO2: 24 mmol/L (ref 20–29)
Calcium: 9.3 mg/dL (ref 8.7–10.3)
Chloride: 99 mmol/L (ref 96–106)
Creatinine, Ser: 0.68 mg/dL (ref 0.57–1.00)
GFR calc Af Amer: 100 mL/min/{1.73_m2} (ref >59)
GFR, EST NON AFRICAN AMERICAN: 86 mL/min/{1.73_m2} (ref >59)
GLOBULIN, TOTAL: 1.9 g/dL (ref 1.5–4.5)
Glucose: 125 mg/dL — ABNORMAL HIGH (ref 65–99)
Potassium: 4.3 mmol/L (ref 3.5–5.2)
SODIUM: 139 mmol/L (ref 134–144)
Total Protein: 6.2 g/dL (ref 6.0–8.5)

## 2018-05-01 LAB — MAGNESIUM: Magnesium: 2.2 mg/dL (ref 1.6–2.3)

## 2018-05-07 DIAGNOSIS — M25521 Pain in right elbow: Secondary | ICD-10-CM | POA: Diagnosis not present

## 2018-05-10 DIAGNOSIS — H52223 Regular astigmatism, bilateral: Secondary | ICD-10-CM | POA: Diagnosis not present

## 2018-05-10 DIAGNOSIS — Z961 Presence of intraocular lens: Secondary | ICD-10-CM | POA: Diagnosis not present

## 2018-05-10 DIAGNOSIS — H5202 Hypermetropia, left eye: Secondary | ICD-10-CM | POA: Diagnosis not present

## 2018-05-10 DIAGNOSIS — H5211 Myopia, right eye: Secondary | ICD-10-CM | POA: Diagnosis not present

## 2018-05-10 DIAGNOSIS — H04123 Dry eye syndrome of bilateral lacrimal glands: Secondary | ICD-10-CM | POA: Diagnosis not present

## 2018-05-17 ENCOUNTER — Other Ambulatory Visit: Payer: PPO

## 2018-06-14 DIAGNOSIS — G894 Chronic pain syndrome: Secondary | ICD-10-CM | POA: Diagnosis not present

## 2018-06-14 DIAGNOSIS — M542 Cervicalgia: Secondary | ICD-10-CM | POA: Diagnosis not present

## 2018-06-20 ENCOUNTER — Other Ambulatory Visit: Payer: PPO

## 2018-07-05 ENCOUNTER — Ambulatory Visit: Payer: PPO | Admitting: Obstetrics and Gynecology

## 2018-07-06 DIAGNOSIS — M19012 Primary osteoarthritis, left shoulder: Secondary | ICD-10-CM | POA: Diagnosis not present

## 2018-07-30 NOTE — Progress Notes (Signed)
73 y.o. [id number] Married White or Caucasian Not Hispanic or Latino female here for annual exam.  H/O asymptomatic rectocele.  No major medical changes. She is going to need a shoulder replacement, recently had a steroid injection.  No vaginal bleeding. No bowel or bladder c/o      She has chronic neck pain (fell down the stairs at 15, has had 3 surgeries). Able to function.   Patient's last menstrual period was 08/15/1988 (exact date).          Sexually active: No.  The current method of family planning is post menopausal status.    Exercising: Yes.    walking Smoker:  no  Health Maintenance: Pap:  04-15-14 WNL  History of abnormal Pap:  no MMG:  12/06/2017 Birads 1 negative Colonoscopy:  09-16-14 polyps, she will check when she is due for another colonoscopy.  BMD:   12/06/2017 Osteopenia, T score -2.1. FRAX 13.4/3.6%. She tried Fosamax, didn't like it.  TDaP: 12/26/2017 Gardasil: N/A   reports that she has never smoked. She has never used smokeless tobacco. She reports current alcohol use of about 7.0 standard drinks of alcohol per week. She reports that she does not use drugs. She has 3 kids, and 4 grandchildren  Past Medical History:  Diagnosis Date  . Anxiety    sees [facility] [place], [place])  . Colon polyps 08/2014   tubular adenoma and hyperplastic polyps (Dr. Hilarie Fredrickson)  . DDD (degenerative disc disease), cervical dr Patrice Paradise   s/p surgery, ongoing pain  . Depression    h/o hospitalization with ECT  . History of insomnia   . Neck pain   . Osteopenia stopped fosamax ~2006  . Post-operative nausea and vomiting   . Vitamin D deficiency borderline(28)-2010   treated by her GYN    Past Surgical History:  Procedure Laterality Date  . BREAST REDUCTION SURGERY  08/2009  . CATARACT EXTRACTION, BILATERAL Bilateral Sept/October 2019  . HEMORRHOID SURGERY  2006, 12/2014  . KNEE ARTHROSCOPY Right   . LASIK     Dr. Lucita Ferrara  . NASAL SEPTOPLASTY W/ TURBINOPLASTY  1986  . NECK  SURGERY  02/2010([facility])1/03,2005([place])  . REDUCTION MAMMAPLASTY Bilateral 07/24/2008  . TOTAL HIP ARTHROPLASTY Left 08/2007        Current Outpatient Medications  Medication Sig Dispense Refill  . amLODipine (NORVASC) 5 MG tablet Take 1 tablet (5 mg total) by mouth daily. 90 tablet 1  . budesonide-formoterol (SYMBICORT) 160-4.5 MCG/ACT inhaler Inhale 2 puffs into the lungs 2 (two) times daily.    . Calcium Carbonate-Vit D-Min (CALCIUM 1200 PO) Take 2,400 mg by mouth daily.     . Cyanocobalamin (B-12) 5000 MCG CAPS Take by mouth daily.    Marland Kitchen HYDROcodone-acetaminophen (NORCO) 10-325 MG tablet Take 0.5 tablets by mouth as needed.    . lamoTRIgine (LAMICTAL) 150 MG tablet Take 150 mg by mouth at bedtime.      . Multiple Vitamin (MULTIVITAMIN) tablet Take 1 tablet by mouth daily.    . Multiple Vitamins-Minerals (HAIR SKIN AND NAILS FORMULA PO) Take 1 tablet by mouth daily.    Marland Kitchen PROAIR HFA 108 (90 Base) MCG/ACT inhaler Inhale 2 puffs into the lungs every 4 (four) hours as needed for wheezing or shortness of breath. 1 Inhaler 1  . VITAMIN D PO Take 5,000 Int'l Units by mouth daily.    Marland Kitchen zolpidem (AMBIEN) 10 MG tablet Take 2.5 mg by mouth at bedtime as needed.      No current facility-administered  medications for this visit.     Family History  Problem Relation Age of Onset  . Arthritis Mother   . Depression Mother   . Pernicious anemia Mother   . Heart disease Father 39       MI  . Depression Brother   . Diabetes Paternal Uncle   . Breast cancer Cousin        paternal    Review of Systems  Constitutional: Negative.   HENT: Negative.   Eyes: Negative.   Respiratory: Negative.   Cardiovascular: Negative.   Gastrointestinal: Negative.   Endocrine: Negative.   Genitourinary: Negative.   Musculoskeletal: Negative.   Skin: Negative.   Allergic/Immunologic: Negative.   Neurological: Negative.   Hematological: Negative.   Psychiatric/Behavioral: Negative.     Exam:   BP 120/82 (BP  Location: Right Arm, Patient Position: Sitting, Cuff Size: Normal)   Pulse 72   Temp 98.2 F (36.8 C) (Skin)   Ht 5' 1.5" (1.562 m)   Wt 127 lb 3.2 oz (57.7 kg)   LMP 08/15/1988 (Exact Date)   BMI 23.65 kg/m   Weight change: @WEIGHTCHANGE @ Height:   Height: 5' 1.5" (156.2 cm)  Ht Readings from Last 3 Encounters:  07/31/18 5' 1.5" (1.562 m)  04/30/18 5' 1.75" (1.568 m)  03/05/18 5' 1.75" (1.568 m)    General appearance: alert, cooperative and appears stated age Head: Normocephalic, without obvious abnormality, atraumatic Neck: no adenopathy, supple, symmetrical, trachea midline and thyroid normal to inspection and palpation Lungs: clear to auscultation bilaterally Cardiovascular: regular rate and rhythm Breasts: normal appearance, no masses or tenderness Abdomen: soft, non-tender; non distended,  no masses,  no organomegaly Extremities: extremities normal, atraumatic, no cyanosis or edema Skin: Skin color, texture, turgor normal. No rashes or lesions Lymph nodes: Cervical, supraclavicular, and axillary nodes normal. No abnormal inguinal nodes palpated Neurologic: Grossly normal   Pelvic: External genitalia:  no lesions              Urethra:  normal appearing urethra with no masses, tenderness or lesions              Bartholins and Skenes: normal                 Vagina: atrophic appearing vagina with normal color and discharge, no lesions. Small grade 2 rectocele              Cervix: no lesions               Bimanual Exam:  Uterus:  normal size, contour, position, consistency, mobility, non-tender              Adnexa: no mass, fullness, tenderness               Rectovaginal: Confirms               Anus:  normal sphincter tone, no lesions  Chaperone was present for exam.  A:  Well Woman with normal exam  Osteopenia with elevated risk of fracture, she tried fosamax, didn't like it.   Rectocele, asymptomatic  P:   No pap needed  Mammogram UTD  Colonoscopy, she will check  when she is due  Referral to Endocrinology for management of osteopenia  Discussed breast self exam  Discussed calcium and vit D intake  Labs with primary

## 2018-07-31 ENCOUNTER — Encounter: Payer: Self-pay | Admitting: Obstetrics and Gynecology

## 2018-07-31 ENCOUNTER — Ambulatory Visit (INDEPENDENT_AMBULATORY_CARE_PROVIDER_SITE_OTHER): Payer: PPO | Admitting: Obstetrics and Gynecology

## 2018-07-31 ENCOUNTER — Other Ambulatory Visit: Payer: Self-pay

## 2018-07-31 VITALS — BP 120/82 | HR 72 | Temp 98.2°F | Ht 61.5 in | Wt 127.2 lb

## 2018-07-31 DIAGNOSIS — M8580 Other specified disorders of bone density and structure, unspecified site: Secondary | ICD-10-CM | POA: Diagnosis not present

## 2018-07-31 DIAGNOSIS — Z01419 Encounter for gynecological examination (general) (routine) without abnormal findings: Secondary | ICD-10-CM

## 2018-07-31 DIAGNOSIS — N816 Rectocele: Secondary | ICD-10-CM

## 2018-07-31 NOTE — Patient Instructions (Signed)
Call [facility], Dr Hilarie Fredrickson to check when your colonoscopy is due.   EXERCISE AND DIET:  We recommended that you start or continue a regular exercise program for good health. Regular exercise means any activity that makes your heart beat faster and makes you sweat.  We recommend exercising at least 30 minutes per day at least 3 days a week, preferably 4 or 5.  We also recommend a diet low in fat and sugar.  Inactivity, poor dietary choices and obesity can cause diabetes, heart attack, stroke, and kidney damage, among others.    ALCOHOL AND SMOKING:  Women should limit their alcohol intake to no more than 7 drinks/beers/glasses of wine (combined, not each!) per week. Moderation of alcohol intake to this level decreases your risk of breast cancer and liver damage. And of course, no recreational drugs are part of a healthy lifestyle.  And absolutely no smoking or even second hand smoke. Most people know smoking can cause heart and lung diseases, but did you know it also contributes to weakening of your bones? Aging of your skin?  Yellowing of your teeth and nails?  CALCIUM AND VITAMIN D:  Adequate intake of calcium and Vitamin D are recommended.  The recommendations for exact amounts of these supplements seem to change often, but generally speaking 1,200 mg of calcium (between diet and supplement) and 800 units of Vitamin D per day seems prudent. Certain women may benefit from higher intake of Vitamin D.  If you are among these women, your doctor will have told you during your visit.    PAP SMEARS:  Pap smears, to check for cervical cancer or precancers,  have traditionally been done yearly, although recent scientific advances have shown that most women can have pap smears less often.  However, every woman still should have a physical exam from her gynecologist every year. It will include a breast check, inspection of the vulva and vagina to check for abnormal growths or skin changes, a visual exam of the  cervix, and then an exam to evaluate the size and shape of the uterus and ovaries.  And after 75 years of age, a rectal exam is indicated to check for rectal cancers. We will also provide age appropriate advice regarding health maintenance, like when you should have certain vaccines, screening for sexually transmitted diseases, bone density testing, colonoscopy, mammograms, etc.   MAMMOGRAMS:  All women over 51 years old should have a yearly mammogram. Many facilities now offer a "3D" mammogram, which may cost around $50 extra out of pocket. If possible,  we recommend you accept the option to have the 3D mammogram performed.  It both reduces the number of women who will be called back for extra views which then turn out to be normal, and it is better than the routine mammogram at detecting truly abnormal areas.    COLON CANCER SCREENING: Now recommend starting at age 71. At this time colonoscopy is not covered for routine screening until 50. There are take home tests that can be done between 45-49.   COLONOSCOPY:  Colonoscopy to screen for colon cancer is recommended for all women at age 15.  We know, you hate the idea of the prep.  We agree, BUT, having colon cancer and not knowing it is worse!!  Colon cancer so often starts as a polyp that can be seen and removed at colonscopy, which can quite literally save your life!  And if your first colonoscopy is normal and you have no  family history of colon cancer, most women don't have to have it again for 10 years.  Once every ten years, you can do something that may end up saving your life, right?  We will be happy to help you get it scheduled when you are ready.  Be sure to check your insurance coverage so you understand how much it will cost.  It may be covered as a preventative service at no cost, but you should check your particular policy.      Breast Self-Awareness Breast self-awareness means being familiar with how your breasts look and feel. It  involves checking your breasts regularly and reporting any changes to your health care provider. Practicing breast self-awareness is important. A change in your breasts can be a sign of a serious medical problem. Being familiar with how your breasts look and feel allows you to find any problems early, when treatment is more likely to be successful. All women should practice breast self-awareness, including women who have had breast implants. How to do a breast self-exam One way to learn what is normal for your breasts and whether your breasts are changing is to do a breast self-exam. To do a breast self-exam: Look for Changes  1. Remove all the clothing above your waist. 2. Stand in front of a mirror in a room with good lighting. 3. Put your hands on your hips. 4. Push your hands firmly downward. 5. Compare your breasts in the mirror. Look for differences between them (asymmetry), such as: ? Differences in shape. ? Differences in size. ? Puckers, dips, and bumps in one breast and not the other. 6. Look at each breast for changes in your skin, such as: ? Redness. ? Scaly areas. 7. Look for changes in your nipples, such as: ? Discharge. ? Bleeding. ? Dimpling. ? Redness. ? A change in position. Feel for Changes Carefully feel your breasts for lumps and changes. It is best to do this while lying on your back on the floor and again while sitting or standing in the shower or tub with soapy water on your skin. Feel each breast in the following way:  Place the arm on the side of the breast you are examining above your head.  Feel your breast with the other hand.  Start in the nipple area and make  inch (2 cm) overlapping circles to feel your breast. Use the pads of your three middle fingers to do this. Apply light pressure, then medium pressure, then firm pressure. The light pressure will allow you to feel the tissue closest to the skin. The medium pressure will allow you to feel the tissue  that is a little deeper. The firm pressure will allow you to feel the tissue close to the ribs.  Continue the overlapping circles, moving downward over the breast until you feel your ribs below your breast.  Move one finger-width toward the center of the body. Continue to use the  inch (2 cm) overlapping circles to feel your breast as you move slowly up toward your collarbone.  Continue the up and down exam using all three pressures until you reach your armpit.  Write Down What You Find  Write down what is normal for each breast and any changes that you find. Keep a written record with breast changes or normal findings for each breast. By writing this information down, you do not need to depend only on memory for size, tenderness, or location. Write down where you are in your  menstrual cycle, if you are still menstruating. If you are having trouble noticing differences in your breasts, do not get discouraged. With time you will become more familiar with the variations in your breasts and more comfortable with the exam. How often should I examine my breasts? Examine your breasts every month. If you are breastfeeding, the best time to examine your breasts is after a feeding or after using a breast pump. If you menstruate, the best time to examine your breasts is 5-7 days after your period is over. During your period, your breasts are lumpier, and it may be more difficult to notice changes. When should I see my health care provider? See your health care provider if you notice:  A change in shape or size of your breasts or nipples.  A change in the skin of your breast or nipples, such as a reddened or scaly area.  Unusual discharge from your nipples.  A lump or thick area that was not there before.  Pain in your breasts.  Anything that concerns you.

## 2018-08-01 NOTE — Progress Notes (Signed)
Spoke with patient. Advised of message as seen below from [place]. Patient verbalizes understanding. Routing to [place] to cancel referral to Endocrinology.

## 2018-08-06 DIAGNOSIS — M19012 Primary osteoarthritis, left shoulder: Secondary | ICD-10-CM | POA: Diagnosis not present

## 2018-08-13 ENCOUNTER — Telehealth: Payer: Self-pay | Admitting: Family Medicine

## 2018-08-13 DIAGNOSIS — M19012 Primary osteoarthritis, left shoulder: Secondary | ICD-10-CM

## 2018-08-13 NOTE — Telephone Encounter (Signed)
done

## 2018-08-13 NOTE — Telephone Encounter (Signed)
Agree. Please change to virtual AWV only. (no labs needed).

## 2018-08-13 NOTE — Telephone Encounter (Signed)
Pt is suppose to come in for a medicare well visit [place] the 29th and was going to cancel she is going to have shoulder surgery the week after and they state can not be in a dr office with in the week before her surgery because of the COVID 19, could we do a virtual AWV so she can a least have that part of the visit then bring her back for the phycial part of the visit pt can be reached at [phone]

## 2018-08-16 DIAGNOSIS — M545 Low back pain, unspecified: Secondary | ICD-10-CM | POA: Insufficient documentation

## 2018-08-21 DIAGNOSIS — M545 Low back pain: Secondary | ICD-10-CM | POA: Diagnosis not present

## 2018-08-22 DIAGNOSIS — F3342 Major depressive disorder, recurrent, in full remission: Secondary | ICD-10-CM | POA: Diagnosis not present

## 2018-08-23 NOTE — Patient Instructions (Addendum)
YOU NEED TO HAVE A COVID 19 TEST  TODAY, July 29th______, THIS TEST MUST BE DONE BEFORE SURGERY, COME TO [facility] ENTRANCE.               ONCE YOUR COVID TEST IS COMPLETED, PLEASE BEGIN THE QUARANTINE INSTRUCTIONS AS OUTLINED IN YOUR HANDOUT.                [name]    Your procedure is scheduled on: Thursday 08/30/2018              Report to [facility] Main  Entrance              Report to admitting at   0730 AM              Call this number if you have problems the morning of surgery [phone]     Remember: Do not eat food After Midnight. YOU MAY HAVE CLEAR LIQUIDS FROM MIDNIGHT UNTIL 7:00AM. At 7:00AM Please finish the prescribed Pre-Surgery ENSURE drink. Nothing by mouth after you finish the ENSURE drink !    CLEAR LIQUID DIET   Foods Allowed                                                                     Foods Excluded  Coffee and tea, regular and decaf                             liquids that you cannot  Plain Jell-O in any flavor                                             see through such as: Fruit ices (not with fruit pulp)                                     milk, soups, orange juice  Iced Popsicles                                    All solid food Carbonated beverages, regular and diet                                    Cranberry, grape and apple juices Sports drinks like Gatorade Lightly seasoned clear broth or consume(fat free) Sugar, honey syrup  Sample Menu Breakfast                                Lunch                                     Supper Cranberry juice  Beef broth                            Chicken broth Jell-O                                     Grape juice                           Apple juice Coffee or tea                        Jell-O                                      Popsicle                                                Coffee or tea                        Coffee or  tea  _____________________________________________________________________    BRUSH YOUR TEETH MORNING OF SURGERY AND RINSE YOUR MOUTH OUT, NO CHEWING GUM CANDY OR MINTS.     Take these medicines the morning of surgery with A SIP OF WATER: AMLODIPINE, HYDROCODONE IF NEEDED, SYMBICORT INHALER                                  You may not have any metal on your body including hair pins and              piercings  Do not wear jewelry, make-up, lotions, powders or perfumes, deodorant             Do not wear nail polish.  Do not shave  48 hours prior to surgery.              Do not bring valuables to the hospital. [place].  Contacts, dentures or bridgework may not be worn into surgery.  Leave suitcase in the car. After surgery it may be brought to your room.                   Please read over the following fact sheets you were given: _____________________________________________________________________             [facility] - Preparing for Surgery Before surgery, you can play an important role.  Because skin is not sterile, your skin needs to be as free of germs as possible.  You can reduce the number of germs on your skin by washing with CHG (chlorahexidine gluconate) soap before surgery.  CHG is an antiseptic cleaner which kills germs and bonds with the skin to continue killing germs even after washing. Please DO NOT use if you have an allergy to CHG or antibacterial soaps.  If your skin becomes reddened/irritated stop using the CHG and inform your nurse when you arrive at Short Stay. Do not shave (including  legs and underarms) for at least 48 hours prior to the first CHG shower.  You may shave your face/neck. Please follow these instructions carefully:  1.  Shower with CHG Soap the night before surgery and the  morning of Surgery.  2.  If you choose to wash your hair, wash your hair first as usual with your  normal  shampoo.  3.   After you shampoo, rinse your hair and body thoroughly to remove the  shampoo.                           4.  Use CHG as you would any other liquid soap.  You can apply chg directly  to the skin and wash                       Gently with a scrungie or clean washcloth.  5.  Apply the CHG Soap to your body ONLY FROM THE NECK DOWN.   Do not use on face/ open                           Wound or open sores. Avoid contact with eyes, ears mouth and genitals (private parts).                       Wash face,  Genitals (private parts) with your normal soap.             6.  Wash thoroughly, paying special attention to the area where your surgery  will be performed.  7.  Thoroughly rinse your body with warm water from the neck down.  8.  DO NOT shower/wash with your normal soap after using and rinsing off  the CHG Soap.                9.  Pat yourself dry with a clean towel.            10.  Wear clean pajamas.            11.  Place clean sheets on your bed the night of your first shower and do not  sleep with pets. Day of Surgery : Do not apply any lotions/deodorants the morning of surgery.  Please wear clean clothes to the hospital/surgery center.  FAILURE TO FOLLOW THESE INSTRUCTIONS MAY RESULT IN THE CANCELLATION OF YOUR SURGERY PATIENT SIGNATURE_________________________________  NURSE SIGNATURE__________________________________  ________________________________________________________________________   [name]  An incentive spirometer is a tool that can help keep your lungs clear and active. This tool measures how well you are filling your lungs with each breath. Taking long deep breaths may help reverse or decrease the chance of developing breathing (pulmonary) problems (especially infection) following:  A long period of time when you are unable to move or be active. BEFORE THE PROCEDURE   If the spirometer includes an indicator to show your best effort, your nurse or respiratory  therapist will set it to a desired goal.  If possible, sit up straight or lean slightly forward. Try not to slouch.  Hold the incentive spirometer in an upright position. INSTRUCTIONS FOR USE  1. Sit on the edge of your bed if possible, or sit up as far as you can in bed or on a chair. 2. Hold the incentive spirometer in an upright position. 3. Breathe out normally. 4. Place the mouthpiece in your mouth and  seal your lips tightly around it. 5. Breathe in slowly and as deeply as possible, raising the piston or the ball toward the top of the column. 6. Hold your breath for 3-5 seconds or for as long as possible. Allow the piston or ball to fall to the bottom of the column. 7. Remove the mouthpiece from your mouth and breathe out normally. 8. Rest for a few seconds and repeat Steps 1 through 7 at least 10 times every 1-2 hours when you are awake. Take your time and take a few normal breaths between deep breaths. 9. The spirometer may include an indicator to show your best effort. Use the indicator as a goal to work toward during each repetition. 10. After each set of 10 deep breaths, practice coughing to be sure your lungs are clear. If you have an incision (the cut made at the time of surgery), support your incision when coughing by placing a pillow or rolled up towels firmly against it. Once you are able to get out of bed, walk around indoors and cough well. You may stop using the incentive spirometer when instructed by your caregiver.  RISKS AND COMPLICATIONS  Take your time so you do not get dizzy or light-headed.  If you are in pain, you may need to take or ask for pain medication before doing incentive spirometry. It is harder to take a deep breath if you are having pain. AFTER USE  Rest and breathe slowly and easily.  It can be helpful to keep track of a log of your progress. Your caregiver can provide you with a simple table to help with this. If you are using the spirometer at home,  follow these instructions: [place] IF:   You are having difficultly using the spirometer.  You have trouble using the spirometer as often as instructed.  Your pain medication is not giving enough relief while using the spirometer.  You develop fever of 100.5 F (38.1 C) or higher. SEEK IMMEDIATE MEDICAL CARE IF:   You cough up bloody sputum that had not been present before.  You develop fever of 102 F (38.9 C) or greater.  You develop worsening pain at or near the incision site. MAKE SURE YOU:   Understand these instructions.  Will watch your condition.  Will get help right away if you are not doing well or get worse. Document Released: 06/27/2006 Document Revised: 05/09/2011 Document Reviewed: 08/28/2006 [facility] Patient Information 2014 [place], [place].   ________________________________________________________________________

## 2018-08-26 DIAGNOSIS — L565 Disseminated superficial actinic porokeratosis (DSAP): Secondary | ICD-10-CM | POA: Insufficient documentation

## 2018-08-26 NOTE — Patient Instructions (Addendum)
HEALTH MAINTENANCE RECOMMENDATIONS:  It is recommended that you get at least 30 minutes of aerobic exercise at least 5 days/week (for weight loss, you may need as much as 60-90 minutes). This can be any activity that gets your heart rate up. This can be divided in 10-15 minute intervals if needed, but try and build up your endurance at least once a week.  Weight bearing exercise is also recommended twice weekly.  Eat a healthy diet with lots of vegetables, fruits and fiber.  "Colorful" foods have a lot of vitamins (ie green vegetables, tomatoes, red peppers, etc).  Limit sweet tea, regular sodas and alcoholic beverages, all of which has a lot of calories and sugar.  Up to 1 alcoholic drink daily may be beneficial for women (unless trying to lose weight, watch sugars).  Drink a lot of water.  Calcium recommendations are 1200-1500 mg daily (1500 mg for postmenopausal women or women without ovaries), and vitamin D 1000 IU daily.  This should be obtained from diet and/or supplements (vitamins), and calcium should not be taken all at once, but in divided doses.  Monthly self breast exams and yearly mammograms for women over the age of 37 is recommended.  Sunscreen of at least SPF 30 should be used on all sun-exposed parts of the skin when outside between the hours of 10 am and 4 pm (not just when at beach or pool, but even with exercise, golf, tennis, and yard work!)  Use a sunscreen that says "broad spectrum" so it covers both UVA and UVB rays, and make sure to reapply every 1-2 hours.  Remember to change the batteries in your smoke detectors when changing your clock times in the spring and fall. Carbon monoxide detectors are recommended for your home.  Use your seat belt every time you are in a car, and please drive safely and not be distracted with cell phones and texting while driving.   [name] , Thank you for taking time to come for your Medicare Wellness Visit. I appreciate your ongoing  commitment to your health goals. Please review the following plan we discussed and let me know if I can assist you in the future.   This is a list of the screening recommended for you and due dates:  Health Maintenance  Topic Date Due  . Flu Shot  09/29/2018  . Mammogram  12/07/2018  . Colon Cancer Screening  09/16/2019  . Tetanus Vaccine  12/27/2027  . DEXA scan (bone density measurement)  Completed  .  Hepatitis C: One time screening is recommended by Center for Disease Control  (CDC) for  adults born from [birth year] through 1967.   Completed  . Pneumonia vaccines  Completed   Continue yearly high dose flu shots in the Fall  Next bone density test will be due 11/2019.   Today we discussed treatment options for your osteopenia (thinning of bones), including Reclast (once yearly infusion), Boniva or Actonel (once a month tablet, very similar to the fosamax/alendronate you previously took in the Fall), Evista (raloxifene--once daily tablet), versus not treating (other than making sure that your vitamin D is adequately replaced, getting adequate calcium in your diet, and getting weight-bearing exercise at least 2x/week) and rechecking bone density test in 11/2019--then basing treatment decisions on next scan.  Let me know if you decide on any treatment option (once you're doing well after your surgery), or else we will just wait until next year.  Someone from the office will be contacting  you to schedule your follow-up vitamin D level (lab visit).  You do not need to fast for that test (okay to eat on the day of that blood test).  I recommend getting the new shingles vaccine (Shingrix). This should be covered by Medicare Part D, so you need to get this from the pharmacy rather than our office.  It is a series of 2 injections, spaced 2 months apart.  We discussed putting closed captioning on your TV, so that you don't have to make it very loud and disturb your husband at night.  Please get [place] copies  of your Living Will and [place] at your convenience (so that it can be scanned into your hospital chart).  You mentioned swelling in your feet by the late afternoon. We discussed the potential causes, including side effect of amlodipine, related to the veins in your leg, or higher sodium intake.  Getting regular exercise helps keeps the muscles contracting to get the blood back up to your heart.  Wearing compression socks/stockings can help prevent the swelling (put them on in the morning, especially when you are planning a lot of sitting/standing).  Limiting sodium (salt) in your diet, can also help limit swelling.  If your swelling isn't going down by morning, or if swelling is worsening, please schedule an in-office visit for further evaluation.  We discussed that abnormal arm swing due to shoulder pain, or pain in your back/hip could potentially make your walking feel off-balances ("swaying", as you described it).  If it is an ongoing/worsening problem, be sure to discuss it with your orthopedist, or return here for in-office evaluation.  Best of luck with your upcoming surgery--wishing you a speedy recovery with resolution of the worst of your pain!

## 2018-08-26 NOTE — Progress Notes (Signed)
Start time: 11:58--ended up having trouble with video (was freezing, including the audio portion)--changed to telephone only at 12:12 End time: 12:51   Virtual Visit via Video Note (completed by telephone, as stated above)  I connected with [name] on 08/27/2018 by a video enabled telemedicine application and verified that I am speaking with the correct person using two identifiers.  Location: Patient: home, outside, alone Provider: office   I discussed the limitations of evaluation and management by telemedicine and the availability of in person appointments. The patient expressed understanding and agreed to proceed. She consents to have insurance filed for this visit.  History of Present Illness:  Chief Complaint  Patient presents with  . Medicare Wellness    VIRTUAL AWV. Has been having some ankle swelling, three nights ago was the worst. Also feels like she is swaying when she is walking.    Patient presents for Medicare Annual Wellness Visit and follow-up on chronic problems.  She is scheduled for left shoulder replacement later this week (which is why visit was changed to virtual).  Chief complaint today is that of not feeling like She isn't walking straight, "swaying"--has good balance, not sure why. Started in February or March, only when she first starts to walk, or when she stands up from sofa. Seems to drift a little to the left, but "then I get my bearings" and can walk straight.  Unsure if related to her pain (has pain in her arm with walking, not having a normal arm swing, and some back pain).  Hypertension follow-up: She has been taking amlodipine 22m daily. She has noticed some swelling in her ankles late in the afternoon (around 4pm).  By morning, swelling is gone. BP's have been running 124-136/70-80, mostly 120's/75. She had some increased cramps reported at her March visit.  Labs done at that time were normal (c-met, Mg and CBC).  She hasn't had any significant cramps  recently.  Vitamin D was noted to be low in December at her GYN, dose was increased (OTC dose). Level was 25.1, and dose was increased from 2000 IU to 5000 IU.  She was supposed to have her level rechecked by GYN in 3 months (wasn't done). She is compliant with taking 5000 IU daily.  Osteopenia.  Repeat DEXA 11/2017 was stable, elevated FRAX score for hip fracture.  GYN originally referred to endocrinology for this, but I said I'd be happy to follow. She apparently was put on fosamax and didn't like it--started by her GYN after her last DEXA (11/2017).  Some of her joint pains improved after stopping it.  She also had noted hair loss (med started around the same time as losartan, she wasn't sure which med contributed).  They had discussed Evista as an alternative in the past (per GYN's notes, pt doesn't recall this). She is not currently taking any bone-building medications.   Chronic neck pain. Previously under the care of Dr. HMaryjean Ka  [place] [facility] who referred her to physiatry for injections). The shoulder is hurting more than her neck now, neck is about the same.  Sees Dr. CPatrice Paradise(or his PA) every 6 months, he prescribes her hydrocodone (takes 1/2 pill every other day).  SalonPas, topical meds and massage help some. Has used robaxin in the past as well--she reports robaxin makes her feel weird, doesn't like taking them, only helps a little. Pain no longer radiates into her arm (just the shoulder arthritis on the  Left).  She is seeing [facility], Dr. Rolena Infante' PA, last seen last week for her back pain (she had R hip pain); told it was degenerative disease in her back.  She underwent OT for pain in R elbow in March. Pain has resolved.  She had h/o pain in her feet from neuroma (bilaterally) and planned to have surgery done on the right (that foot was worse; related to which shoes she wears).  Her pain improved, never had surgery and no longer needs  it.  Immunization History  Administered Date(s) Administered  . Influenza Split 11/13/2015  . Influenza, High Dose Seasonal PF 03/05/2014, 12/15/2014, 11/25/2016, 12/04/2017  . Influenza,inj,quad, With Preservative 11/28/2016  . Influenza-Unspecified 12/29/2012, 11/25/2016  . Pneumococcal Conjugate-13 07/02/2014  . Pneumococcal Polysaccharide-23 08/29/2007, 07/06/2015  . Tdap 08/29/2007, 12/26/2017  . Zoster 08/29/2007   Last Pap smear:03/2014 with GYN, normal; last GYN exam 07/2018 Last mammogram:11/2017 Last colonoscopy:09/16/14, Tubular Adenoma x 1, hyperplastic polyp x 1, repeat in 5 years Last DEXA:11/2017 T-2.1 at R fem neck, elevated FRAX risk at hip, 3.6% Dentist: twice yearly Ophtho: yearly Exercise:  Walks 35 minutes 3x/week (unable to do Yoga due to her shoulder pain). Hep C screen: negative, done by GYN 04/2015 Lipids: Lab Results  Component Value Date   CHOL 187 07/07/2014   HDL 72 07/07/2014   LDLCALC 94 07/07/2014   TRIG 105 07/07/2014   CHOLHDL 2.6 07/07/2014    Other doctors caring for patient include: GYN: Dr. Talbert Nan Dentist: Dr. Gloriann Loan Ophtho: Dr. Marica Otter ([facility] did cataract surgery) Dermatologist: Dr.Gould, Dr. Gwyneth Sprout ([facility]) Podiatrist: Dr. Paulla Dolly Ortho-spine: Dr. Rennis Harding Ortho--Dr. Alvan Dame (seen in the past for L hip bursitis), Dr. Onnie Graham (shoulder) Neurosurgeon--Dr. Sherwood Gambler (no longer sees), [facility] (08/2017) Pain doctor--Dr. Maryjean Ka (previously saw Dr. Nelva Bush, also has seen Dr. Brien Few in the past)  Doesn't see one currently, Dr. Patrice Paradise prescribes her pain medications Psych: Debbe Bales (PA at [facility]) GI: Dr. Hilarie Fredrickson ENT: Dr. Redmond Baseman (cerumen removal) Allergist: Dr. Harold Hedge Physical Therapist: previously saw Linton Rump ([facility])   Depression screen: negative Fall screen: none Functional status screen:decreased hearing (has hearing aids, doesn't wear them) Mini-Cog screening: normal 3 word recall--couldn't do clock  drawing due to video failing during visit.  See questionnaires in epic.   End of Life Discussion: Patient does havea living willandmedical power of attorney  Past Medical History:  Diagnosis Date  . Anxiety    sees [facility] Debbe Bales, [place])  . Colon polyps 08/2014   tubular adenoma and hyperplastic polyps (Dr. Hilarie Fredrickson)  . DDD (degenerative disc disease), cervical dr Patrice Paradise   s/p surgery, ongoing pain  . Depression    h/o hospitalization with ECT  . History of insomnia   . Neck pain   . Osteopenia stopped fosamax ~2006  . Post-operative nausea and vomiting   . Vitamin D deficiency borderline(28)-2010   treated by her GYN    Past Surgical History:  Procedure Laterality Date  . BREAST REDUCTION SURGERY  08/2009  . CATARACT EXTRACTION, BILATERAL Bilateral Sept/October 2019  . HEMORRHOID SURGERY  2006, 12/2014  . KNEE ARTHROSCOPY Right   . LASIK     Dr. Lucita Ferrara  . NASAL SEPTOPLASTY W/ TURBINOPLASTY  1986  . NECK SURGERY  02/2010([facility])1/03,2005([facility])  . REDUCTION MAMMAPLASTY Bilateral 07/24/2008  . TOTAL HIP ARTHROPLASTY Left 08/2007        Social History   Socioeconomic History  . Marital status: Married    Spouse name: Not on  file  . Number of children: 3  . Years of education: Not on file  . Highest education level: Not on file  Occupational History  . Not on file  Social Needs  . Financial resource strain: Not on file  . Food insecurity    Worry: Not on file    Inability: Not on file  . Transportation needs    Medical: Not on file    Non-medical: Not on file  Tobacco Use  . Smoking status: Never Smoker  . Smokeless tobacco: Never Used  Substance and Sexual Activity  . Alcohol use: Yes    Alcohol/week: 7.0 standard drinks    Types: 7 Glasses of wine per week    Comment: 1 glass of wine each night  . Drug use: No  . Sexual activity: Not Currently    Partners: Male    Birth control/protection: Post-menopausal  Lifestyle  . Physical activity     Days per week: Not on file    Minutes per session: Not on file  . Stress: Not on file  Relationships  . Social Herbalist on phone: Not on file    Gets together: Not on file    Attends religious service: Not on file    Active member of club or organization: Not on file    Attends meetings of clubs or organizations: Not on file    Relationship status: Not on file  . Intimate partner violence    Fear of current or ex partner: Not on file    Emotionally abused: Not on file    Physically abused: Not on file    Forced sexual activity: Not on file  Other Topics Concern  . Not on file  Social History Narrative   Lives with husband. Has 3 kids, 4 grandchildren. Daughter (Kendra--drug rep, lives here in [place])   Other children live in [place]; Son moved to [place]    Family History  Problem Relation Age of Onset  . Arthritis Mother   . Depression Mother   . Pernicious anemia Mother   . Heart disease Father 20       MI  . Depression Brother   . Diabetes Paternal Uncle   . Breast cancer Cousin        paternal    Outpatient Encounter Medications as of 08/27/2018  Medication Sig Note  . amLODipine (NORVASC) 5 MG tablet Take 1 tablet (5 mg total) by mouth daily.   . B Complex-C (B-COMPLEX WITH VITAMIN C) tablet Take 1 tablet by mouth 2 (two) times a day. 08/27/2018: Takes B-complex 100 once daily  . budesonide-formoterol (SYMBICORT) 160-4.5 MCG/ACT inhaler Inhale 2 puffs into the lungs daily as needed (shortness of breath).  08/27/2018: Last used it yesterday (dust storm, coughing a lot, had been outside)  . HYDROcodone-acetaminophen (NORCO) 10-325 MG tablet Take 0.5 tablets by mouth every 6 (six) hours as needed for moderate pain.  08/27/2018: Uses 1/2 tablet as needed, every 2 days usually  . lamoTRIgine (LAMICTAL) 150 MG tablet Take 150 mg by mouth at bedtime.     . Liniments (SALONPAS PAIN RELIEF PATCH EX) Place 1 patch onto the skin daily as needed (pain).   . Menthol,  Topical Analgesic, (BIOFREEZE EX) Apply 1 application topically daily as needed (joint pain).   . Multiple Vitamins-Minerals (HAIR SKIN AND NAILS FORMULA PO) Take 1 tablet by mouth daily.   Marland Kitchen VITAMIN D PO Take 5,000 Units by mouth daily.    Marland Kitchen  zinc gluconate 50 MG tablet Take 50 mg by mouth daily.   Marland Kitchen zolpidem (AMBIEN) 10 MG tablet Take 2.5 mg by mouth at bedtime.    . Calcium Carbonate-Vit D-Min (CALCIUM 1200 PO) Take 2,400 mg by mouth daily.    Marland Kitchen PROAIR HFA 108 (90 Base) MCG/ACT inhaler Inhale 2 puffs into the lungs every 4 (four) hours as needed for wheezing or shortness of breath. (Patient not taking: Reported on 08/27/2018)   . [DISCONTINUED] Cyanocobalamin (B-12) 5000 MCG CAPS Take by mouth daily.   . [DISCONTINUED] Multiple Vitamin (MULTIVITAMIN) tablet Take 1 tablet by mouth daily.    No facility-administered encounter medications on file as of 08/27/2018.     Allergies  Allergen Reactions  . Cymbalta [Duloxetine Hcl]     Abdominal pain  . Nitrofurantoin [Macrodantin]   . Sulfamethoxazole Hives  . Cefdinir Rash    ROS: The patient denies anorexia, fever, weight changes, headaches, vision changes, ear pain, sore throat, breast concerns, chest pain, palpitations, dizziness, syncope, dyspnea on exertion,  nausea, vomiting, diarrhea, constipation, abdominal pain, melena, hematochezia, indigestion/heartburn, hematuria, incontinence, dysuria,vaginal bleeding, discharge, odor or itch,  numbness, tingling, weakness, tremor (slight, just after coffee), suspicious skin lesions, depression, anxiety (well controlled), abnormal bleeding/bruising, or enlarged lymph nodes. +hearing loss (has hearing aids, doesn't wear them) Neck, shoulder  Pain per HPI.  Foot pain resolved. Some flare of breathing/bronchitis yesterday (outside, poor air quality due to dust), relieved by Symbicort Edema per HPI. Gait issue, per HPI.   Observations/Objective:  BP 130/78   Ht 5' 1.5" (1.562 m)   Wt 124 lb  (56.2 kg)   LMP 08/15/1988 (Exact Date)   BMI 23.05 kg/m   Wt Readings from Last 3 Encounters:  08/27/18 124 lb (56.2 kg)  07/31/18 127 lb 3.2 oz (57.7 kg)  04/30/18 125 lb 12.8 oz (57.1 kg)   Well appearing, pleasant female, in good spirits. She is alert, oriented, cranial nerves are grossly intact. Normal eye contact, speech, grooming. Partway through visit, had to change to telephone only--wasn't able to have her do clock drawing (for mini-cog screen) prior to losing video. Exam limited due to virtual nature of visit. Unable to assess gait or perform neuro exam.   Assessment and Plan:  Medicare annual wellness visit, subsequent]  Essential hypertension - controlled; continue 68m amlodipine  Vitamin D deficiency - due for repeat level; is a contributing factor to her osteopenia - Plan: VITAMIN D 25 Hydroxy (Vit-D Deficiency, Fractures)  Osteopenia with high risk of fracture - didn't like fosamax. Discussed Reclast, Boniva/Actonel, Raloxifene. Not candidate for Prolia (not osteoporosis yet); Discussed Ca, D, wt bearing exercise.  - Plan: VITAMIN D 25 Hydroxy (Vit-D Deficiency, Fractures)  Chronic neck pain - stable; cont heat, massage, exercise and topical meds, with sparing use of pain meds  Osteoarthritis of left shoulder, unspecified osteoarthritis type - shoulder replacement scheduled for later this week  Peripheral edema - Ddx reviewed, including dependent edema, SE from amlodipine. Discussed exercise, compression socks, leg elevation, low Na diet. Contact uK[place]if worsening   Gait abnormality - unable to assess with exam, not progressive; poss affected by abnl arm swing, pain. f/u if persists/worsens, d/w ortho   Discussed compression stockings, low Na diet, regular exercise, leg elevation for swelling.  F/u if worsening, doesn't resolve by morning.  Pt asked to get uK[place]acopies of her Living Will and healthcare POA at her convenience.  Closed caption for your TV to keep the  volume down at night so  it doesn't bother your husband.  Balance/walking problem--poss due to pain.  Consider office visit or mention to ortho at upcoming visits.   Discussed Reclast, Evista, monthly bisphosphonate.  Not candidate for Prolia, doesn't have osteoporosis yet. She has Osteopenia with elevated FRAX score.  Vitamin D deficiency may be a contributing factor.  Needs repeat Vitamin D level.  She isn't a candidate for Prolia (not covered unless osteoporosis); Discussed Vitamin D, Ca an  Discussed monthly self breast exams and yearly mammograms; at least 30 minutes of aerobic activity at least 5 days/week, weight-bearing exercise at least 2x/wk; proper sunscreen use reviewed; healthy diet, including goals of calcium and vitamin D intake and alcohol recommendations (less than or equal to 1 drink/day) reviewed; regular seatbelt use; changing batteries in smoke detectors. Immunization recommendations discussed--continue yearly high dose flu shots. Shingrix recommended, risks/side effects discussed, to get from pharmacy. Colonoscopy recommendations reviewed--UTD, due again 08/2019.    Follow Up Instructions:  Print/mail AVS F/u 6 months sooner prn  I discussed the assessment and treatment plan with the patient. The patient was provided an opportunity to ask questions and all were answered. The patient agreed with the plan and demonstrated an understanding of the instructions.   The patient was advised to call back or seek an in-person evaluation if the symptoms worsen or if the condition fails to improve as anticipated.  I provided 53 minutes of non-face-to-face time during this encounter.   Vikki Ports, MD

## 2018-08-27 ENCOUNTER — Ambulatory Visit (INDEPENDENT_AMBULATORY_CARE_PROVIDER_SITE_OTHER): Payer: PPO | Admitting: Family Medicine

## 2018-08-27 ENCOUNTER — Other Ambulatory Visit (HOSPITAL_COMMUNITY)
Admission: RE | Admit: 2018-08-27 | Discharge: 2018-08-27 | Disposition: A | Discharge status: Home / Self Care | Payer: PPO | Source: Ambulatory Visit | Attending: Orthopedic Surgery | Admitting: Orthopedic Surgery

## 2018-08-27 ENCOUNTER — Encounter: Payer: Self-pay | Admitting: Family Medicine

## 2018-08-27 ENCOUNTER — Other Ambulatory Visit: Payer: Self-pay

## 2018-08-27 ENCOUNTER — Encounter (HOSPITAL_COMMUNITY)
Admission: RE | Admit: 2018-08-27 | Discharge: 2018-08-27 | Disposition: A | Discharge status: Home / Self Care | Payer: PPO | Source: Ambulatory Visit | Attending: Orthopedic Surgery | Admitting: Orthopedic Surgery

## 2018-08-27 ENCOUNTER — Encounter (HOSPITAL_COMMUNITY): Payer: Self-pay

## 2018-08-27 VITALS — BP 130/78 | Ht 61.5 in | Wt 124.0 lb

## 2018-08-27 DIAGNOSIS — E559 Vitamin D deficiency, unspecified: Secondary | ICD-10-CM | POA: Diagnosis not present

## 2018-08-27 DIAGNOSIS — M8580 Other specified disorders of bone density and structure, unspecified site: Secondary | ICD-10-CM | POA: Diagnosis not present

## 2018-08-27 DIAGNOSIS — F418 Other specified anxiety disorders: Secondary | ICD-10-CM | POA: Diagnosis not present

## 2018-08-27 DIAGNOSIS — R609 Edema, unspecified: Secondary | ICD-10-CM | POA: Diagnosis not present

## 2018-08-27 DIAGNOSIS — M542 Cervicalgia: Secondary | ICD-10-CM

## 2018-08-27 DIAGNOSIS — I10 Essential (primary) hypertension: Secondary | ICD-10-CM

## 2018-08-27 DIAGNOSIS — Z0000 Encounter for general adult medical examination without abnormal findings: Secondary | ICD-10-CM | POA: Diagnosis not present

## 2018-08-27 DIAGNOSIS — G8929 Other chronic pain: Secondary | ICD-10-CM

## 2018-08-27 DIAGNOSIS — G8918 Other acute postprocedural pain: Secondary | ICD-10-CM | POA: Diagnosis not present

## 2018-08-27 DIAGNOSIS — F329 Major depressive disorder, single episode, unspecified: Secondary | ICD-10-CM | POA: Diagnosis present

## 2018-08-27 DIAGNOSIS — Z96642 Presence of left artificial hip joint: Secondary | ICD-10-CM | POA: Diagnosis present

## 2018-08-27 DIAGNOSIS — Z8249 Family history of ischemic heart disease and other diseases of the circulatory system: Secondary | ICD-10-CM | POA: Diagnosis not present

## 2018-08-27 DIAGNOSIS — Z1159 Encounter for screening for other viral diseases: Secondary | ICD-10-CM | POA: Diagnosis not present

## 2018-08-27 DIAGNOSIS — Z8261 Family history of arthritis: Secondary | ICD-10-CM | POA: Diagnosis not present

## 2018-08-27 DIAGNOSIS — M19012 Primary osteoarthritis, left shoulder: Secondary | ICD-10-CM | POA: Diagnosis present

## 2018-08-27 DIAGNOSIS — R269 Unspecified abnormalities of gait and mobility: Secondary | ICD-10-CM | POA: Diagnosis not present

## 2018-08-27 HISTORY — DX: Essential (primary) hypertension: I10

## 2018-08-27 LAB — CBC
HCT: 39 % (ref 36.0–46.0)
Hemoglobin: 12.6 g/dL (ref 12.0–15.0)
MCH: 29.9 pg (ref 26.0–34.0)
MCHC: 32.3 g/dL (ref 30.0–36.0)
MCV: 92.4 fL (ref 80.0–100.0)
Platelets: 314 10*3/uL (ref 150–400)
RBC: 4.22 MIL/uL (ref 3.87–5.11)
RDW: 12.8 % (ref 11.5–15.5)
WBC: 6.4 10*3/uL (ref 4.0–10.5)
nRBC: 0 % (ref 0.0–0.2)

## 2018-08-27 LAB — BASIC METABOLIC PANEL
Anion gap: 10 (ref 5–15)
BUN: 21 mg/dL (ref 8–23)
CO2: 26 mmol/L (ref 22–32)
Calcium: 9.2 mg/dL (ref 8.9–10.3)
Chloride: 102 mmol/L (ref 98–111)
Creatinine, Ser: 0.94 mg/dL (ref 0.44–1.00)
GFR calc Af Amer: >60 mL/min (ref >60)
GFR calc non Af Amer: 59 mL/min — ABNORMAL LOW (ref >60)
Glucose, Bld: 105 mg/dL — ABNORMAL HIGH (ref 70–99)
Potassium: 3.8 mmol/L (ref 3.5–5.1)
Sodium: 138 mmol/L (ref 135–145)

## 2018-08-27 LAB — SURGICAL PCR SCREEN
MRSA, PCR: NEGATIVE
Staphylococcus aureus: NEGATIVE

## 2018-08-27 LAB — SARS CORONAVIRUS 2 (TAT 6-24 HRS): SARS Coronavirus 2: NEGATIVE

## 2018-08-28 NOTE — Progress Notes (Signed)
done

## 2018-08-30 ENCOUNTER — Other Ambulatory Visit: Payer: Self-pay

## 2018-08-30 ENCOUNTER — Inpatient Hospital Stay (HOSPITAL_COMMUNITY)
Admission: RE | Admit: 2018-08-30 | Discharge: 2018-08-31 | DRG: 483 | Disposition: A | Discharge status: Home / Self Care | Payer: PPO | Attending: Orthopedic Surgery | Admitting: Orthopedic Surgery

## 2018-08-30 ENCOUNTER — Encounter (HOSPITAL_COMMUNITY)
Admission: RE | Disposition: A | Discharge status: Home / Self Care | Payer: Self-pay | Source: Other Acute Inpatient Hospital | Attending: Orthopedic Surgery

## 2018-08-30 ENCOUNTER — Encounter (HOSPITAL_COMMUNITY): Payer: Self-pay

## 2018-08-30 ENCOUNTER — Inpatient Hospital Stay (HOSPITAL_COMMUNITY): Payer: PPO | Admitting: Anesthesiology

## 2018-08-30 ENCOUNTER — Inpatient Hospital Stay (HOSPITAL_COMMUNITY): Payer: PPO | Admitting: Physician Assistant

## 2018-08-30 DIAGNOSIS — F329 Major depressive disorder, single episode, unspecified: Secondary | ICD-10-CM | POA: Diagnosis present

## 2018-08-30 DIAGNOSIS — Z96642 Presence of left artificial hip joint: Secondary | ICD-10-CM | POA: Diagnosis present

## 2018-08-30 DIAGNOSIS — Z1159 Encounter for screening for other viral diseases: Secondary | ICD-10-CM | POA: Diagnosis not present

## 2018-08-30 DIAGNOSIS — Z96612 Presence of left artificial shoulder joint: Secondary | ICD-10-CM

## 2018-08-30 DIAGNOSIS — Z8249 Family history of ischemic heart disease and other diseases of the circulatory system: Secondary | ICD-10-CM

## 2018-08-30 DIAGNOSIS — M19012 Primary osteoarthritis, left shoulder: Secondary | ICD-10-CM | POA: Diagnosis present

## 2018-08-30 DIAGNOSIS — I10 Essential (primary) hypertension: Secondary | ICD-10-CM | POA: Diagnosis present

## 2018-08-30 DIAGNOSIS — Z8261 Family history of arthritis: Secondary | ICD-10-CM | POA: Diagnosis not present

## 2018-08-30 HISTORY — PX: TOTAL SHOULDER ARTHROPLASTY: SHX126

## 2018-08-30 SURGERY — ARTHROPLASTY, SHOULDER, TOTAL
Anesthesia: General | Laterality: Left

## 2018-08-30 MED ORDER — CLINDAMYCIN PHOSPHATE 900 MG/50ML IV SOLN
900.0000 mg | INTRAVENOUS | Status: AC
  Administered 2018-08-30: 900 mg via INTRAVENOUS
  Filled 2018-08-30: qty 50

## 2018-08-30 MED ORDER — ROCURONIUM BROMIDE 10 MG/ML (PF) SYRINGE
PREFILLED_SYRINGE | INTRAVENOUS | Status: AC
  Filled 2018-08-30: qty 10

## 2018-08-30 MED ORDER — ONDANSETRON HCL 4 MG/2ML IJ SOLN: 4.0000 mg | Freq: Four times a day (QID) | INTRAMUSCULAR | Status: DC | PRN

## 2018-08-30 MED ORDER — FENTANYL CITRATE (PF) 100 MCG/2ML IJ SOLN
50.0000 ug | INTRAMUSCULAR | Status: DC
  Administered 2018-08-30: 50 ug via INTRAVENOUS
  Filled 2018-08-30: qty 2

## 2018-08-30 MED ORDER — PHENOL 1.4 % MT LIQD: 1.0000 | OROMUCOSAL | Status: DC | PRN

## 2018-08-30 MED ORDER — ALBUTEROL SULFATE HFA 108 (90 BASE) MCG/ACT IN AERS: 2.0000 | INHALATION_SPRAY | RESPIRATORY_TRACT | Status: DC | PRN

## 2018-08-30 MED ORDER — LACTATED RINGERS IV SOLN
INTRAVENOUS | Status: DC
  Administered 2018-08-30: 08:00:00 via INTRAVENOUS

## 2018-08-30 MED ORDER — OXYCODONE HCL 5 MG PO TABS: 10.0000 mg | ORAL_TABLET | ORAL | Status: DC | PRN

## 2018-08-30 MED ORDER — PHENYLEPHRINE HCL (PRESSORS) 10 MG/ML IV SOLN
INTRAVENOUS | Status: AC
  Filled 2018-08-30: qty 1

## 2018-08-30 MED ORDER — ONDANSETRON HCL 4 MG/2ML IJ SOLN
INTRAMUSCULAR | Status: DC | PRN
  Administered 2018-08-30: 4 mg via INTRAVENOUS

## 2018-08-30 MED ORDER — ONDANSETRON HCL 4 MG/2ML IJ SOLN
INTRAMUSCULAR | Status: AC
  Filled 2018-08-30: qty 2

## 2018-08-30 MED ORDER — FENTANYL CITRATE (PF) 100 MCG/2ML IJ SOLN
INTRAMUSCULAR | Status: DC | PRN
  Administered 2018-08-30: 50 ug via INTRAVENOUS

## 2018-08-30 MED ORDER — FENTANYL CITRATE (PF) 100 MCG/2ML IJ SOLN
INTRAMUSCULAR | Status: AC
  Filled 2018-08-30: qty 2

## 2018-08-30 MED ORDER — TRANEXAMIC ACID-NACL 1000-0.7 MG/100ML-% IV SOLN
1000.0000 mg | INTRAVENOUS | Status: AC
  Administered 2018-08-30: 1000 mg via INTRAVENOUS
  Filled 2018-08-30: qty 100

## 2018-08-30 MED ORDER — MENTHOL 3 MG MT LOZG: 1.0000 | LOZENGE | OROMUCOSAL | Status: DC | PRN

## 2018-08-30 MED ORDER — FENTANYL CITRATE (PF) 100 MCG/2ML IJ SOLN: 25.0000 ug | INTRAMUSCULAR | Status: DC | PRN

## 2018-08-30 MED ORDER — MOMETASONE FURO-FORMOTEROL FUM 200-5 MCG/ACT IN AERO
2.0000 | INHALATION_SPRAY | Freq: Two times a day (BID) | RESPIRATORY_TRACT | Status: DC
  Filled 2018-08-30: qty 8.8

## 2018-08-30 MED ORDER — LAMOTRIGINE 25 MG PO TABS
150.0000 mg | ORAL_TABLET | Freq: Every day | ORAL | Status: DC
  Administered 2018-08-30: 150 mg via ORAL
  Filled 2018-08-30: qty 1

## 2018-08-30 MED ORDER — ONDANSETRON HCL 4 MG PO TABS: 4.0000 mg | ORAL_TABLET | Freq: Four times a day (QID) | ORAL | Status: DC | PRN

## 2018-08-30 MED ORDER — ONDANSETRON HCL 4 MG PO TABS: 4.0000 mg | ORAL_TABLET | Freq: Three times a day (TID) | ORAL | 0 refills | Status: DC | PRN

## 2018-08-30 MED ORDER — TRAMADOL HCL 50 MG PO TABS: 50.0000 mg | ORAL_TABLET | Freq: Four times a day (QID) | ORAL | Status: DC | PRN

## 2018-08-30 MED ORDER — ROCURONIUM BROMIDE 10 MG/ML (PF) SYRINGE
PREFILLED_SYRINGE | INTRAVENOUS | Status: DC | PRN
  Administered 2018-08-30 (×2): 10 mg via INTRAVENOUS
  Administered 2018-08-30: 50 mg via INTRAVENOUS

## 2018-08-30 MED ORDER — ALUM & MAG HYDROXIDE-SIMETH 200-200-20 MG/5ML PO SUSP: 30.0000 mL | ORAL | Status: DC | PRN

## 2018-08-30 MED ORDER — DEXAMETHASONE SODIUM PHOSPHATE 10 MG/ML IJ SOLN
INTRAMUSCULAR | Status: DC | PRN
  Administered 2018-08-30: 10 mg via INTRAVENOUS

## 2018-08-30 MED ORDER — BUPIVACAINE LIPOSOME 1.3 % IJ SUSP
INTRAMUSCULAR | Status: DC | PRN
  Administered 2018-08-30: 10 mL via PERINEURAL

## 2018-08-30 MED ORDER — SODIUM CHLORIDE 0.9 % IV SOLN
INTRAVENOUS | Status: DC | PRN
  Administered 2018-08-30: 50 ug/min via INTRAVENOUS

## 2018-08-30 MED ORDER — CYCLOBENZAPRINE HCL 10 MG PO TABS: 10.0000 mg | ORAL_TABLET | Freq: Three times a day (TID) | ORAL | 1 refills | Status: DC | PRN

## 2018-08-30 MED ORDER — METHOCARBAMOL 500 MG PO TABS
500.0000 mg | ORAL_TABLET | Freq: Four times a day (QID) | ORAL | Status: DC | PRN
  Administered 2018-08-31: 500 mg via ORAL
  Filled 2018-08-30: qty 1

## 2018-08-30 MED ORDER — SCOPOLAMINE 1 MG/3DAYS TD PT72
MEDICATED_PATCH | TRANSDERMAL | Status: DC | PRN
  Administered 2018-08-30: 1 via TRANSDERMAL

## 2018-08-30 MED ORDER — PROPOFOL 10 MG/ML IV BOLUS
INTRAVENOUS | Status: AC
  Filled 2018-08-30: qty 20

## 2018-08-30 MED ORDER — FLEET ENEMA 7-19 GM/118ML RE ENEM: 1.0000 | ENEMA | Freq: Once | RECTAL | Status: DC | PRN

## 2018-08-30 MED ORDER — OXYCODONE HCL 5 MG PO TABS: 5.0000 mg | ORAL_TABLET | Freq: Once | ORAL | Status: DC | PRN

## 2018-08-30 MED ORDER — ZOLPIDEM TARTRATE 5 MG PO TABS
5.0000 mg | ORAL_TABLET | Freq: Every evening | ORAL | Status: DC | PRN
  Administered 2018-08-30: 5 mg via ORAL
  Filled 2018-08-30: qty 1

## 2018-08-30 MED ORDER — BUPIVACAINE HCL (PF) 0.5 % IJ SOLN
INTRAMUSCULAR | Status: DC | PRN
  Administered 2018-08-30: 15 mL via PERINEURAL

## 2018-08-30 MED ORDER — KETOROLAC TROMETHAMINE 15 MG/ML IJ SOLN
7.5000 mg | Freq: Four times a day (QID) | INTRAMUSCULAR | Status: AC
  Administered 2018-08-30 – 2018-08-31 (×4): 7.5 mg via INTRAVENOUS
  Filled 2018-08-30 (×3): qty 1

## 2018-08-30 MED ORDER — SCOPOLAMINE 1 MG/3DAYS TD PT72
MEDICATED_PATCH | TRANSDERMAL | Status: AC
  Filled 2018-08-30: qty 1

## 2018-08-30 MED ORDER — DIPHENHYDRAMINE HCL 12.5 MG/5ML PO ELIX: 12.5000 mg | ORAL_SOLUTION | ORAL | Status: DC | PRN

## 2018-08-30 MED ORDER — HYDROMORPHONE HCL 1 MG/ML IJ SOLN: 0.5000 mg | INTRAMUSCULAR | Status: DC | PRN

## 2018-08-30 MED ORDER — PROPOFOL 10 MG/ML IV BOLUS
INTRAVENOUS | Status: DC | PRN
  Administered 2018-08-30: 20 mg via INTRAVENOUS
  Administered 2018-08-30: 150 mg via INTRAVENOUS

## 2018-08-30 MED ORDER — DEXAMETHASONE SODIUM PHOSPHATE 10 MG/ML IJ SOLN
INTRAMUSCULAR | Status: AC
  Filled 2018-08-30: qty 1

## 2018-08-30 MED ORDER — POLYETHYLENE GLYCOL 3350 17 G PO PACK: 17.0000 g | PACK | Freq: Every day | ORAL | Status: DC | PRN

## 2018-08-30 MED ORDER — LIDOCAINE 2% (20 MG/ML) 5 ML SYRINGE
INTRAMUSCULAR | Status: AC
  Filled 2018-08-30: qty 5

## 2018-08-30 MED ORDER — OXYCODONE HCL 5 MG/5ML PO SOLN: 5.0000 mg | Freq: Once | ORAL | Status: DC | PRN

## 2018-08-30 MED ORDER — METOCLOPRAMIDE HCL 5 MG PO TABS: 5.0000 mg | ORAL_TABLET | Freq: Three times a day (TID) | ORAL | Status: DC | PRN

## 2018-08-30 MED ORDER — SODIUM CHLORIDE 0.9 % IR SOLN
Status: DC | PRN
  Administered 2018-08-30: 1000 mL

## 2018-08-30 MED ORDER — ALBUTEROL SULFATE (2.5 MG/3ML) 0.083% IN NEBU: 2.5000 mg | INHALATION_SOLUTION | RESPIRATORY_TRACT | Status: DC | PRN

## 2018-08-30 MED ORDER — LIDOCAINE HCL (CARDIAC) PF 100 MG/5ML IV SOSY
PREFILLED_SYRINGE | INTRAVENOUS | Status: DC | PRN
  Administered 2018-08-30: 20 mg via INTRAVENOUS

## 2018-08-30 MED ORDER — DOCUSATE SODIUM 100 MG PO CAPS
100.0000 mg | ORAL_CAPSULE | Freq: Two times a day (BID) | ORAL | Status: DC
  Administered 2018-08-30 – 2018-08-31 (×2): 100 mg via ORAL
  Filled 2018-08-30 (×2): qty 1

## 2018-08-30 MED ORDER — METHOCARBAMOL 500 MG IVPB - SIMPLE MED
500.0000 mg | Freq: Four times a day (QID) | INTRAVENOUS | Status: DC | PRN
  Filled 2018-08-30: qty 50

## 2018-08-30 MED ORDER — AMLODIPINE BESYLATE 5 MG PO TABS: 5.0000 mg | ORAL_TABLET | Freq: Every day | ORAL | Status: DC

## 2018-08-30 MED ORDER — CHLORHEXIDINE GLUCONATE 4 % EX LIQD: 60.0000 mL | Freq: Once | CUTANEOUS | Status: DC

## 2018-08-30 MED ORDER — SUGAMMADEX SODIUM 200 MG/2ML IV SOLN
INTRAVENOUS | Status: DC | PRN
  Administered 2018-08-30: 150 mg via INTRAVENOUS

## 2018-08-30 MED ORDER — OXYCODONE HCL 5 MG PO TABS
5.0000 mg | ORAL_TABLET | ORAL | Status: DC | PRN
  Administered 2018-08-30 – 2018-08-31 (×2): 5 mg via ORAL
  Filled 2018-08-30 (×3): qty 1

## 2018-08-30 MED ORDER — MIDAZOLAM HCL 2 MG/2ML IJ SOLN
1.0000 mg | INTRAMUSCULAR | Status: DC
  Administered 2018-08-30: 1 mg via INTRAVENOUS
  Filled 2018-08-30: qty 2

## 2018-08-30 MED ORDER — BISACODYL 5 MG PO TBEC: 5.0000 mg | DELAYED_RELEASE_TABLET | Freq: Every day | ORAL | Status: DC | PRN

## 2018-08-30 MED ORDER — LACTATED RINGERS IV SOLN
INTRAVENOUS | Status: DC
  Administered 2018-08-30: 13:00:00 via INTRAVENOUS

## 2018-08-30 MED ORDER — OXYCODONE-ACETAMINOPHEN 5-325 MG PO TABS: 1.0000 | ORAL_TABLET | ORAL | 0 refills | Status: DC | PRN

## 2018-08-30 MED ORDER — METOCLOPRAMIDE HCL 5 MG/ML IJ SOLN: 5.0000 mg | Freq: Three times a day (TID) | INTRAMUSCULAR | Status: DC | PRN

## 2018-08-30 MED ORDER — ACETAMINOPHEN 325 MG PO TABS: 325.0000 mg | ORAL_TABLET | Freq: Four times a day (QID) | ORAL | Status: DC | PRN

## 2018-08-30 MED ORDER — KETOROLAC TROMETHAMINE 15 MG/ML IJ SOLN
INTRAMUSCULAR | Status: AC
  Filled 2018-08-30: qty 1

## 2018-08-30 SURGICAL SUPPLY — 61 items
BAG ZIPLOCK 12X15 (MISCELLANEOUS) ×2 IMPLANT
BIT DRILL 2.0X128 (BIT) ×2 IMPLANT
BLADE SAW SGTL 83.5X18.5 (BLADE) ×2 IMPLANT
CEMENT BONE DEPUY (Cement) ×2 IMPLANT
COOLER ICEMAN CLASSIC (MISCELLANEOUS) ×1 IMPLANT
COVER BACK TABLE 60X90IN (DRAPES) ×2 IMPLANT
COVER SURGICAL LIGHT HANDLE (MISCELLANEOUS) ×2 IMPLANT
COVER WAND RF STERILE (DRAPES) IMPLANT
DERMABOND ADVANCED (GAUZE/BANDAGES/DRESSINGS) ×1
DERMABOND ADVANCED .7 DNX12 (GAUZE/BANDAGES/DRESSINGS) ×1 IMPLANT
DRAPE ORTHO SPLIT 77X108 STRL (DRAPES) ×2
DRAPE SURG 17X11 SM STRL (DRAPES) ×2 IMPLANT
DRAPE SURG ORHT 6 SPLT 77X108 (DRAPES) ×2 IMPLANT
DRAPE U-SHAPE 47X51 STRL (DRAPES) ×2 IMPLANT
DRSG AQUACEL AG ADV 3.5X 6 (GAUZE/BANDAGES/DRESSINGS) ×1 IMPLANT
DRSG AQUACEL AG ADV 3.5X10 (GAUZE/BANDAGES/DRESSINGS) ×2 IMPLANT
DURAPREP 26ML APPLICATOR (WOUND CARE) ×3 IMPLANT
ELECT BLADE TIP CTD 4 INCH (ELECTRODE) ×2 IMPLANT
ELECT REM PT RETURN 15FT ADLT (MISCELLANEOUS) ×1 IMPLANT
FACESHIELD WRAPAROUND (MASK) ×10 IMPLANT
FACESHIELD WRAPAROUND OR TEAM (MASK) ×4 IMPLANT
GLENOID WITH CLEAT SM (Miscellaneous) ×1 IMPLANT
GLOVE BIO SURGEON STRL SZ7.5 (GLOVE) ×2 IMPLANT
GLOVE BIO SURGEON STRL SZ8 (GLOVE) ×2 IMPLANT
GLOVE SS BIOGEL STRL SZ 7 (GLOVE) ×1 IMPLANT
GLOVE SS BIOGEL STRL SZ 7.5 (GLOVE) ×1 IMPLANT
GLOVE SUPERSENSE BIOGEL SZ 7 (GLOVE) ×1
GLOVE SUPERSENSE BIOGEL SZ 7.5 (GLOVE) ×1
GOWN STRL REUS W/TWL LRG LVL3 (GOWN DISPOSABLE) ×4 IMPLANT
HEAD HUMERAL UNIVERSE 42X17 (Head) ×1 IMPLANT
KIT BASIN OR (CUSTOM PROCEDURE TRAY) ×2 IMPLANT
KIT SET UNIVERSAL (KITS) ×1 IMPLANT
KIT TURNOVER KIT A (KITS) IMPLANT
MANIFOLD NEPTUNE II (INSTRUMENTS) ×2 IMPLANT
MARKER SKIN DUAL TIP RULER LAB (MISCELLANEOUS) ×2 IMPLANT
NDL TAPERED W/ NITINOL LOOP (MISCELLANEOUS) ×1 IMPLANT
NEEDLE TAPERED W/ NITINOL LOOP (MISCELLANEOUS) ×2 IMPLANT
NS IRRIG 1000ML POUR BTL (IV SOLUTION) ×2 IMPLANT
PACK SHOULDER (CUSTOM PROCEDURE TRAY) ×2 IMPLANT
PAD COLD SHLDR WRAP-ON (PAD) IMPLANT
PROTECTOR NERVE ULNAR (MISCELLANEOUS) ×2 IMPLANT
RESTRAINT HEAD UNIVERSAL NS (MISCELLANEOUS) ×2 IMPLANT
SLING ARM FOAM STRAP LRG (SOFTGOODS) IMPLANT
SLING ARM FOAM STRAP MED (SOFTGOODS) ×1 IMPLANT
SMARTMIX MINI TOWER (MISCELLANEOUS) ×2
SPONGE LAP 18X18 RF (DISPOSABLE) ×1 IMPLANT
SPONGE LAP 4X18 RFD (DISPOSABLE) IMPLANT
STEM APEX UNIVERSAL 6MM SHOULD (Stem) ×1 IMPLANT
SUCTION FRAZIER HANDLE 12FR (TUBING) ×1
SUCTION TUBE FRAZIER 12FR DISP (TUBING) ×1 IMPLANT
SUT FIBERWIRE #2 38 T-5 BLUE (SUTURE) ×4
SUT MNCRL AB 3-0 PS2 18 (SUTURE) ×2 IMPLANT
SUT MON AB 2-0 CT1 36 (SUTURE) ×2 IMPLANT
SUT VIC AB 1 CT1 36 (SUTURE) ×8 IMPLANT
SUTURE FIBERWR #2 38 T-5 BLUE (SUTURE) ×1 IMPLANT
SUTURE TAPE 1.3 40 TPR END (SUTURE) ×4 IMPLANT
SUTURETAPE 1.3 40 TPR END (SUTURE) ×8
TOWEL OR 17X26 10 PK STRL BLUE (TOWEL DISPOSABLE) ×2 IMPLANT
TOWEL OR NON WOVEN STRL DISP B (DISPOSABLE) ×2 IMPLANT
TOWER SMARTMIX MINI (MISCELLANEOUS) IMPLANT
WATER STERILE IRR 1000ML POUR (IV SOLUTION) ×4 IMPLANT

## 2018-08-30 NOTE — Op Note (Signed)
08/30/2018  11:59 AM  PATIENT:   [name]  73 y.o. female  PRE-OPERATIVE DIAGNOSIS:  left shoulder osteoarthritis  POST-OPERATIVE DIAGNOSIS: Same  PROCEDURE: Left anatomic total shoulder arthroplasty utilizing a press-fit size 6 Arthrex stem with a 42 x 17 eccentric head, and a small glenoid  SURGEON:  Nazareth Norenberg, Metta Clines M.D.  ASSISTANTS: Jenetta Loges, PA-C  ANESTHESIA:   General endotracheal and interscalene block with Exparel  EBL: 150 cc  SPECIMEN: None  Drains: None   PATIENT DISPOSITION:  PACU - hemodynamically stable.    PLAN OF CARE: Admit for overnight observation  Brief history:  Patient is a 73 year old female who has experienced chronic and progressively increasing left shoulder pain related to end stage osteoarthritis.  She maintains functional motion but has severe pain and increasing functional limitations.  Her symptoms have been refractory to prolonged attempts at conservative management.  She is brought to the operating room at this time for planned left total shoulder arthroplasty  Preoperatively I counseled [name] regarding treatment options as well as the potential risks versus benefits thereof.  Possible surgical complications were reviewed including bleeding, infection, neurovascular injury, persistent pain, loss of motion, anesthetic complication, failure the implant, and possible need for additional surgery.  She understands and accepts and agrees with her planned procedure.  Procedure in detail:  After undergoing routine preop evaluation patient received prophylactic antibiotics and an interscalene block with Exparel was established in the holding area by the anesthesia department.  Placed supine on the operating table and underwent the smooth induction of a general endotracheal anesthesia.  Placed into the beachchair position and appropriately padded and protected.  The left shoulder girdle region was sterilely prepped and draped in standard fashion.   Timeout was called.  An anterior deltopectoral approach left shoulder was made through an 8 cm incision.  Skin flaps were elevated dissection carried deeply and the deltopectoral interval was developed from proximal to distal with a vein taken laterally.  The long head biceps tendon was then unroofed and tenodesed at the upper border the pectoralis major and the proximal segment was then excised.  Conjoined tendon retracted medially the rotator cuff was then divided along the rotator interval towards the base of the coracoid and then we outlined the superior and inferior margins of the insertion upon the lesser tuberosity and then used an oscillating saw to create a wafer thin lesser tuberosity osteotomy and then divided the capsular attachments from the anterior and infra margins of the humeral neck and the subscap was intact and reflected medially.  The humeral head was then delivered through the wound we outlined our proposed humeral head resection with the extra medullary guide and this was then completed with an oscillating saw carefully protecting the rotator cuff superiorly and posteriorly.  Osteophytes of the margin of the humeral neck were then removed with rondure.  The humeral canal was then prepared with hand reaming to size 6 and broaching to size 6 which showed excellent fit fixation.  This was performed maintaining the native retroversion of approximately 30 degrees.  A trial stem with metal cap was then placed into the humeral canal to protect the cut metaphyseal surface we then exposed the glenoid with appropriate retractors and performed a circumferential labral resection gaining complete visualization of the entire periphery of the glenoid.  A guidepin was then placed into the center of the glenoid and the glenoid was then reamed to a stable subchondral bony bed and then preparation was completed by  placing our central followed by the superior and inferior peg and slot respectively.  The glenoid  was then broached and the trial showed excellent fit and fixation.  At this point on the back table we mixed cement and this was then introduced into the superior and inferior peg and slot respectively.  The glenoid was then impacted with excellent fit and fixation.  We returned our attention to the proximal humerus where the stem was inserted and we passed the suture limbs from the eyelets on our stem through the bone tunnels on the humeral metaphysis that we previously placed in preparation for the LTO repair.  Once the suture limbs were passed we then terminally seated the stem with excellent fit and fixation and then the proximal locking screws were tightened.  At this point a series of trial reductions were then performed and felt that the 42 x 17 eccentric head gave [place] excellent soft tissue balance with approximately 50% translation of the humeral head over the glenoid.  Trial was then removed the [facility] taper was cleaned and dried and the final eccentric head was then impacted into position and a final reduction was then completed.  This point the joint was then copiously irrigated and hemostasis was obtained.  The lesser tuberosity was then repaired back to the humeral metaphyseal region using our previously using our previously placed sutures which allowed a horizontal and crossing suture limbs over the LTO bone fragment allowing excellent re-apposition and stability of this construct.  The rotator interval was then reapproximated with a series of figure-of-eight suture tape sutures allowing excellent apposition of the soft tissue envelope around the implant and once this was completed the arm easily achieved 45 degrees of external rotation without excessive tension on the subscapularis.  At this point additional irrigation was completed.  Hemostasis was obtained.  The deltopectoral interval was reapproximated with a series of figure-of-eight #1 Vicryl sutures.  2-0 Vicryl used for the subcu layer and  intracuticular 3-0 Monocryl for the skin followed by Dermabond and Aquasol dressing in the left arm was then placed in the sling and the patient was awakened, extubated, and taken to the recovery room in stable condition.  Jenetta Loges, PA-C was used as an Environmental consultant throughout this case essential for help with positioning the patient, positioning the extremity, tissue manipulation, implantation of the prosthesis, wound closure, and intraoperative decision-making.  Metta Clines Brittny Spangle MD   Contact # [phone]

## 2018-08-30 NOTE — Anesthesia Procedure Notes (Signed)
Procedure Name: Intubation Date/Time: 08/30/2018 10:11 AM Performed by: Glory Buff, CRNA Pre-anesthesia Checklist: Patient identified, Emergency Drugs available, Suction available and Patient being monitored Patient Re-evaluated:Patient Re-evaluated prior to induction Oxygen Delivery Method: Circle system utilized Preoxygenation: Pre-oxygenation with 100% oxygen Induction Type: IV induction Ventilation: Mask ventilation without difficulty Laryngoscope Size: Glidescope and 4 Grade View: Grade II Tube type: Oral Tube size: 7.0 mm Number of attempts: 2 (DL x 1 with miller 3, unable to view vocal cords, glidescope #4 used with VC seen and oral intubation, VSS through out with easy mask ventilatiion.) Airway Equipment and Method: Stylet and Oral airway Placement Confirmation: ETT inserted through vocal cords under direct vision,  positive ETCO2 and breath sounds checked- equal and bilateral Secured at: 20 cm Tube secured with: Tape Dental Injury: Teeth and Oropharynx as per pre-operative assessment  Difficulty Due To: Difficulty was anticipated, Difficult Airway- due to anterior larynx and Difficult Airway- due to limited oral opening

## 2018-08-30 NOTE — Anesthesia Postprocedure Evaluation (Signed)
Anesthesia Post Note  Patient: [name]  Procedure(s) Performed: TOTAL SHOULDER ARTHROPLASTY (Left )     Patient location during evaluation: PACU Anesthesia Type: General Level of consciousness: awake and alert Pain management: pain level controlled Vital Signs Assessment: post-procedure vital signs reviewed and stable Respiratory status: spontaneous breathing, nonlabored ventilation, respiratory function stable and patient connected to nasal cannula oxygen Cardiovascular status: blood pressure returned to baseline and stable Postop Assessment: no apparent nausea or vomiting Anesthetic complications: no    Last Vitals:  Vitals:   08/30/18 1300 08/30/18 1315  BP: 126/71 118/68  Pulse: 78 68  Resp: (!) 24 13  Temp: (!) 36.4 C   SpO2: 100% 100%    Last Pain:  Vitals:   08/30/18 1300  TempSrc:   PainSc: 0-No pain                 Ebelyn Bohnet S

## 2018-08-30 NOTE — Progress Notes (Signed)
Assisted Dr. Hodierne with left, ultrasound guided, interscalene  block. Side rails up, monitors on throughout procedure. See vital signs in flow sheet. Tolerated Procedure well. 

## 2018-08-30 NOTE — Transfer of Care (Signed)
Immediate Anesthesia Transfer of Care Note  Patient: [name]  Procedure(s) Performed: TOTAL SHOULDER ARTHROPLASTY (Left )  Patient Location: PACU  Anesthesia Type:General  Level of Consciousness: drowsy, patient cooperative and responds to stimulation  Airway & Oxygen Therapy: Patient Spontanous Breathing and Patient connected to face mask oxygen  Post-op Assessment: Report given to RN and Post -op Vital signs reviewed and stable  Post vital signs: Reviewed and stable  Last Vitals:  Vitals Value Taken Time  BP 123/67 08/30/18 1212  Temp 36.4 C 08/30/18 1211  Pulse 69 08/30/18 1214  Resp 13 08/30/18 1214  SpO2 100 % 08/30/18 1214  Vitals shown include unvalidated device data.  Last Pain:  Vitals:   08/30/18 0925  TempSrc:   PainSc: 0-No pain         Complications: No apparent anesthesia complications

## 2018-08-30 NOTE — Anesthesia Preprocedure Evaluation (Addendum)
Anesthesia Evaluation  Patient identified by MRN, date of birth, ID band Patient awake    Reviewed: Allergy & Precautions, H&P , NPO status , Patient's Chart, lab work & pertinent test results  History of Anesthesia Complications (+) PONV and history of anesthetic complications  Airway Mallampati: II   Neck ROM: full    Dental   Pulmonary neg pulmonary ROS,    breath sounds clear to auscultation       Cardiovascular hypertension,  Rhythm:regular Rate:Normal     Neuro/Psych PSYCHIATRIC DISORDERS Anxiety Depression    GI/Hepatic   Endo/Other    Renal/GU      Musculoskeletal  (+) Arthritis ,   Abdominal   Peds  Hematology   Anesthesia Other Findings   Reproductive/Obstetrics                             Anesthesia Physical Anesthesia Plan  ASA: II  Anesthesia Plan: General   Post-op Pain Management:  Regional for Post-op pain   Induction: Intravenous  PONV Risk Score and Plan: 4 or greater and Ondansetron, Dexamethasone, Midazolam, Scopolamine patch - Pre-op and Treatment may vary due to age or medical condition  Airway Management Planned: Oral ETT  Additional Equipment:   Intra-op Plan:   Post-operative Plan: Extubation in OR  Informed Consent: I have reviewed the patients History and Physical, chart, labs and discussed the procedure including the risks, benefits and alternatives for the proposed anesthesia with the patient or authorized representative who has indicated his/her understanding and acceptance.       Plan Discussed with: CRNA, Anesthesiologist and Surgeon  Anesthesia Plan Comments:         Anesthesia Quick Evaluation

## 2018-08-30 NOTE — Anesthesia Procedure Notes (Signed)
Anesthesia Regional Block: Interscalene brachial plexus block   Pre-Anesthetic Checklist: ,, timeout performed, Correct Patient, Correct Site, Correct Laterality, Correct Procedure, Correct Position, site marked, Risks and benefits discussed,  Surgical consent,  Pre-op evaluation,  At surgeon's request and post-op pain management  Laterality: Left  Prep: chloraprep       Needles:  Injection technique: Single-shot  Needle Type: Echogenic Stimulator Needle     Needle Length: 5cm  Needle Gauge: 22     Additional Needles:   Procedures:, nerve stimulator,,,,,,,   Nerve Stimulator or Paresthesia:  Response: biceps flexion, 0.45 mA,   Additional Responses:   Narrative:  Start time: 08/30/2018 9:12 AM End time: 08/30/2018 9:18 AM Injection made incrementally with aspirations every 5 mL.  Performed by: Personally  Anesthesiologist: Albertha Ghee, MD  Additional Notes: Functioning IV was confirmed and monitors were applied.  A 18mm 22ga Arrow echogenic stimulator needle was used. Sterile prep and drape,hand hygiene and sterile gloves were used.  Negative aspiration and negative test dose prior to incremental administration of local anesthetic. The patient tolerated the procedure well.  Ultrasound guidance: relevent anatomy identified, needle position confirmed, local anesthetic spread visualized around nerve(s), vascular puncture avoided.  Image printed for medical record.

## 2018-08-30 NOTE — Plan of Care (Signed)
POC

## 2018-08-30 NOTE — H&P (Signed)
[name]    Chief Complaint: left shoulder osteoarthritis HPI: The patient is a 73 y.o. female with end stage left shoulder osteoarthritis and increasing pain and functional rotations which are severely impacting her ability to perform day-to-day activities.  Past Medical History:  Diagnosis Date  . Anxiety    sees [name] [name], [place])  . Colon polyps 08/2014   tubular adenoma and hyperplastic polyps (Dr. Hilarie Huerta)  . DDD (degenerative disc disease), cervical dr [name]   s/p surgery, ongoing pain  . Depression    h/o hospitalization with ECT  . History of insomnia   . Hypertension   . Neck pain   . Osteopenia stopped fosamax ~2006  . Post-operative nausea and vomiting   . Vitamin D deficiency borderline(28)-2010   treated by her GYN    Past Surgical History:  Procedure Laterality Date  . BREAST REDUCTION SURGERY  08/2009  . CATARACT EXTRACTION, BILATERAL Bilateral Sept/October 2019  . HEMORRHOID SURGERY  2006, 12/2014  . KNEE ARTHROSCOPY Right   . LASIK     Dr. Lucita Huerta  . NASAL SEPTOPLASTY W/ TURBINOPLASTY  1986  . NECK SURGERY  02/2010([name])1/03,2005([name])  . REDUCTION MAMMAPLASTY Bilateral 07/24/2008  . TOTAL HIP ARTHROPLASTY Left 08/2007        Family History  Problem Relation Age of Onset  . Arthritis Mother   . Depression Mother   . Pernicious anemia Mother   . Heart disease Father 37       MI  . Depression Brother   . Diabetes Paternal Uncle   . Breast cancer Cousin        paternal    Social History:  reports that she has never smoked. She has never used smokeless tobacco. She reports current alcohol use of about 7.0 standard drinks of alcohol per week. She reports that she does not use drugs.   Medications Prior to Admission  Medication Sig Dispense Refill  . amLODipine (NORVASC) 5 MG tablet Take 1 tablet (5 mg total) by mouth daily. 90 tablet 1  . B Complex-C (B-COMPLEX WITH VITAMIN C) tablet Take 1 tablet by mouth 2 (two) times a day.    .  budesonide-formoterol (SYMBICORT) 160-4.5 MCG/ACT inhaler Inhale 2 puffs into the lungs daily as needed (shortness of breath).     . Calcium Carbonate-Vit D-Min (CALCIUM 1200 PO) Take 2,400 mg by mouth daily.     Marland Kitchen HYDROcodone-acetaminophen (NORCO) 10-325 MG tablet Take 0.5 tablets by mouth every 6 (six) hours as needed for moderate pain.     Marland Kitchen lamoTRIgine (LAMICTAL) 150 MG tablet Take 150 mg by mouth at bedtime.      . Liniments (SALONPAS PAIN RELIEF PATCH EX) Place 1 patch onto the skin daily as needed (pain).    . Menthol, Topical Analgesic, (BIOFREEZE EX) Apply 1 application topically daily as needed (joint pain).    . Multiple Vitamins-Minerals (HAIR SKIN AND NAILS FORMULA PO) Take 1 tablet by mouth daily.    Marland Kitchen PROAIR HFA 108 (90 Base) MCG/ACT inhaler Inhale 2 puffs into the lungs every 4 (four) hours as needed for wheezing or shortness of breath. 1 Inhaler 1  . VITAMIN D PO Take 5,000 Units by mouth daily.     Marland Kitchen zinc gluconate 50 MG tablet Take 50 mg by mouth daily.    Marland Kitchen zolpidem (AMBIEN) 10 MG tablet Take 2.5 mg by mouth at bedtime.        Physical Exam: Left shoulder demonstrates a painful and guarded range of  motion with limitations as noted at her recent office visits.  Plain radiographs are reviewed which confirm advanced arthritis with complete loss of joint space and peripheral osteophyte formation and subchondral sclerosis.  Vitals  Temp:  [98 F (36.7 C)] 98 F (36.7 C) (07/02 0752) Pulse Rate:  [77] 77 (07/02 0752) Resp:  [13] 13 (07/02 0752) BP: (122)/(74) 122/74 (07/02 0752) SpO2:  [98 %] 98 % (07/02 0752) Weight:  [57.7 kg] 57.7 kg (07/02 0752)  Assessment/Plan  Impression: left shoulder osteoarthritis  Plan of Action: Procedure(s): TOTAL SHOULDER ARTHROPLASTY  [name] [name] 08/30/2018, 9:24 AM Contact # [phone]

## 2018-08-30 NOTE — Discharge Instructions (Signed)
° °Kevin M. Supple, M.D., F.A.A.O.S. °Orthopaedic Surgery °Specializing in Arthroscopic and Reconstructive °Surgery of the Shoulder °[phone] °[address] - [place], [place] [address] - Fax [phone] ° ° °POST-OP TOTAL SHOULDER REPLACEMENT INSTRUCTIONS ° °1. Call the office at [phone] to schedule your first post-op appointment 10-14 days from the date of your surgery. ° °2. The bandage over your incision is waterproof. You may begin showering with this dressing on. You may leave this dressing on until first follow up appointment within 2 weeks. We prefer you leave this dressing in place until follow up however after 5-7 days if you are having itching or skin irritation and would like to remove it you may do so. Go slow and tug at the borders gently to break the bond the dressing has with the skin. At this point if there is no drainage it is okay to go without a bandage or you may cover it with a light guaze and tape. You can also expect significant bruising around your shoulder that will drift down your arm and into your chest wall. This is very normal and should resolve over several days. ° ° 3. Wear your sling/immobilizer at all times except to perform the exercises below or to occasionally let your arm dangle by your side to stretch your elbow. You also need to sleep in your sling immobilizer until instructed otherwise. It is ok to remove your sling if you are sitting in a controlled environment and allow your arm to rest in a position of comfort by your side or on your lap with pillows to give your neck and skin a break from the sling. You may remove it to allow arm to dangle by side to shower. If you are up walking around and when you go to sleep at night you need to wear it. ° °4. Range of motion to your elbow, wrist, and hand are encouraged 3-5 times daily. Exercise to your hand and fingers helps to reduce swelling you may experience. ° °5. Utilize ice to the shoulder 3-5 times  minimum a day and additionally if you are experiencing pain. ° °6. Prescriptions for a pain medication and a muscle relaxant are provided for you. It is recommended that if you are experiencing pain that you pain medication alone is not controlling, add the muscle relaxant along with the pain medication which can give additional pain relief. The first 1-2 days is generally the most severe of your pain and then should gradually decrease. As your pain lessens it is recommended that you decrease your use of the pain medications to an "as needed basis'" only and to always comply with the recommended dosages of the pain medications. ° °7. Pain medications can produce constipation along with their use. If you experience this, the use of an over the counter stool softener or laxative daily is recommended.  ° °8. For additional questions or concerns, please do not hesitate to call the office. If after hours there is an answering service to forward your concerns to the physician on call. ° °9.Pain control following an exparel block ° °To help control your post-operative pain you received a nerve block  performed with Exparel which is a long acting anesthetic (numbing agent) which can provide pain relief and sensations of numbness (and relief of pain) in the operative shoulder and arm for up to 3 days. Sometimes it provides mixed relief, meaning you may still have numbness in certain areas of the arm but can still   be able to move  parts of that arm, hand, and fingers. We recommend that your prescribed pain medications  be used as needed. We do not feel it is necessary to "pre medicate" and "stay ahead" of pain.  Taking narcotic pain medications when you are not having any pain can lead to unnecessary and potentially dangerous side effects.    10. Use the ice machine as much as possible in the first 5-7 days from surgery, then you can wean its use to as needed. The ice typically needs to be replaced every 6 hours, instead of  ice you can actually freeze water bottles to put in the cooler and then fill water around them to avoid having to purchase ice. You can have spare water bottles freezing to allow you to rotate them once they have melted. Try to have a thin shirt or light cloth or towel under the ice wrap to protect your skin.  POST-OP EXERCISES  Pendulum Exercises  Perform pendulum exercises while standing and bending at the waist. Support your uninvolved arm on a table or chair and allow your operated arm to hang freely. Make sure to do these exercises passively - not using you shoulder muscles.  Repeat 20 times. Do 3 sessions per day.

## 2018-08-31 ENCOUNTER — Other Ambulatory Visit: Payer: Self-pay

## 2018-08-31 MED ORDER — BUDESONIDE-FORMOTEROL FUMARATE 160-4.5 MCG/ACT IN AERO: 2.0000 | INHALATION_SPRAY | Freq: Two times a day (BID) | RESPIRATORY_TRACT | Status: DC

## 2018-08-31 NOTE — Evaluation (Signed)
Occupational Therapy Evaluation Patient Details Name: [name] MRN: [id number] DOB: Oct 08, 1945 Today's Date: 08/31/2018    History of Present Illness s/p L TSA, h/o depression, anxiety and neck sxs   Clinical Impression   This 73 year old female was admitted for the above sx. All education was completed. Pt has a h/o neck sxs; loosened sling in bed with pillows supporting. She was limited by pain during adl.  Demonstrated pendulums, but pt did not practice. She verbalizes understanding of all education    Follow Up Recommendations  Follow surgeon's recommendation for DC plan and follow-up therapies    Equipment Recommendations  None recommended by OT    Recommendations for Other Services       Precautions / Restrictions Precautions Precautions: Shoulder Type of Shoulder Precautions: sling may come off in sitting with upper arm and forearm supported with pillows. May use arm during adls within the following PROM parameters:  20 ER, 45 ABD and 60 FF.  may do pendulums and AROM elbow through fingers Restrictions LUE Weight Bearing: Non weight bearing      Mobility Bed Mobility Overal bed mobility: Modified Independent(HOB raised)                Transfers Overall transfer level: Independent                    Balance                                           ADL either performed or assessed with clinical judgement   ADL Overall ADL's : Needs assistance/impaired Eating/Feeding: Set up   Grooming: Oral care;Set up   Upper Body Bathing: Moderate assistance   Lower Body Bathing: Moderate assistance   Upper Body Dressing : Maximal assistance   Lower Body Dressing: Maximal assistance   Toilet Transfer: Independent   Toileting- Clothing Manipulation and Hygiene: Modified independent         General ADL Comments: ambulated to bathroom, completed ADL and exercise of wrist and hand.  Pt with increased pain.  Demonstrated passive  pendulums but pt did not want to practice.  She does tend to activate arm--cues to move passively during adls.  Pt is in a standard sling and feels husband can assist without further instruction     Vision         Perception     Praxis      Pertinent Vitals/Pain Pain Assessment: Faces Faces Pain Scale: Hurts even more Pain Location: L shoulder during adls Pain Descriptors / Indicators: Aching Pain Intervention(s): Limited activity within patient's tolerance;Monitored during session;Repositioned;Ice applied(pain decreased when back in bed)     Hand Dominance Right   Extremity/Trunk Assessment Upper Extremity Assessment Upper Extremity Assessment: LUE deficits/detail(block in effect; decreased control L elbow)           Communication Communication Communication: No difficulties   Cognition Arousal/Alertness: Awake/alert Behavior During Therapy: WFL for tasks assessed/performed Overall Cognitive Status: Within Functional Limits for tasks assessed                                     General Comments       Exercises     Shoulder Instructions      Home Living Family/patient expects to be discharged to:: Private  residence Living Arrangements: Spouse/significant other Available Help at Discharge: Family                             Additional Comments: pt is moving well. Doesn't use LUE for pushing up      Prior Functioning/Environment Level of Independence: Independent                 OT Problem List:        OT Treatment/Interventions:      OT Goals(Current goals can be found in the care plan section) Acute Rehab OT Goals Patient Stated Goal: none stated OT Goal Formulation: All assessment and education complete, DC therapy  OT Frequency:     Barriers to D/C:            Co-evaluation              AM-PAC OT "6 Clicks" Daily Activity     Outcome Measure Help from another person eating meals?: None Help from  another person taking care of personal grooming?: A Little Help from another person toileting, which includes using toliet, bedpan, or urinal?: None Help from another person bathing (including washing, rinsing, drying)?: A Lot Help from another person to put on and taking off regular upper body clothing?: A Lot Help from another person to put on and taking off regular lower body clothing?: A Lot 6 Click Score: 17   End of Session    Activity Tolerance: Patient limited by pain Patient left: in bed;with call bell/phone within reach  OT Visit Diagnosis: Pain Pain - Right/Left: Left Pain - part of body: Shoulder                Time: 5916-3846 OT Time Calculation (min): 38 min Charges:  OT General Charges $OT Visit: 1 Visit OT Evaluation $OT Eval Low Complexity: 1 Low OT Treatments $Self Care/Home Management : 8-22 mins $Therapeutic Exercise: 8-22 mins  Lesle Chris, OTR/L Acute Rehabilitation Services [phone] [facility] pager [phone] office 08/31/2018  [name] 08/31/2018, 9:44 AM

## 2018-08-31 NOTE — Progress Notes (Signed)
   Subjective:  Patient reports pain as mild.  No complaints this am.  Ready to go home.  Objective:   VITALS:   Vitals:   08/30/18 1827 08/30/18 2055 08/31/18 0051 08/31/18 0437  BP: 103/74 100/65 (!) 95/58 117/63  Pulse: 80 85 66 65  Resp: 16 16 14 14   Temp: 98.2 F (36.8 C) 98.2 F (36.8 C) 97.6 F (36.4 C) 97.7 F (36.5 C)  TempSrc: Oral Oral Oral Oral  SpO2: 97% 98% 96% 98%  Weight:      Height:        Neurologically intact Neurovascular intact Sensation intact distally Intact pulses distally Incision: dressing C/D/I   Lab Results  Component Value Date   WBC 6.4 08/27/2018   HGB 12.6 08/27/2018   HCT 39.0 08/27/2018   MCV 92.4 08/27/2018   PLT 314 08/27/2018   BMET    Component Value Date/Time   NA 138 08/27/2018 1514   NA 139 04/30/2018 1410   K 3.8 08/27/2018 1514   CL 102 08/27/2018 1514   CO2 26 08/27/2018 1514   GLUCOSE 105 (H) 08/27/2018 1514   BUN 21 08/27/2018 1514   BUN 17 04/30/2018 1410   CREATININE 0.94 08/27/2018 1514   CREATININE 0.65 05/23/2016 1110   CALCIUM 9.2 08/27/2018 1514   GFRNONAA 59 (L) 08/27/2018 1514   GFRAA >60 08/27/2018 1514     Assessment/Plan: 1 Day Post-Op   Active Problems:   Osteoarthritis of left shoulder   Status post total shoulder arthroplasty, left   Up with therapy - sling, NWB  - dc today to home - follow up 2 weeks with Dr. Cristela Felt 08/31/2018, 9:29 AM   Geralynn Rile, MD [phone]

## 2018-09-03 ENCOUNTER — Encounter (HOSPITAL_COMMUNITY): Payer: Self-pay | Admitting: Orthopedic Surgery

## 2018-09-03 ENCOUNTER — Ambulatory Visit: Payer: PPO | Admitting: Family Medicine

## 2018-09-03 NOTE — Discharge Summary (Signed)
PATIENT ID:      [name]  MRN:     [id number] DOB/AGE:    75-16-1947 / 73 y.o.     DISCHARGE SUMMARY  ADMISSION DATE:    08/30/2018 DISCHARGE DATE:  08/31/2018  ADMISSION DIAGNOSIS: left shoulder osteoarthritis Past Medical History:  Diagnosis Date  . Anxiety    sees [facility] Debbe Bales, [place])  . Colon polyps 08/2014   tubular adenoma and hyperplastic polyps (Dr. Hilarie Fredrickson)  . DDD (degenerative disc disease), cervical dr Patrice Paradise   s/p surgery, ongoing pain  . Depression    h/o hospitalization with ECT  . History of insomnia   . Hypertension   . Neck pain   . Osteopenia stopped fosamax ~2006  . Post-operative nausea and vomiting   . Vitamin D deficiency borderline(28)-2010   treated by her GYN    DISCHARGE DIAGNOSIS:   Active Problems:   Osteoarthritis of left shoulder   Status post total shoulder arthroplasty, left   PROCEDURE: Procedure(s): TOTAL SHOULDER ARTHROPLASTY on 08/30/2018  CONSULTS:    HISTORY:  See H&P in chart.  HOSPITAL COURSE:  [name] is a 73 y.o. admitted on 08/30/2018 with a diagnosis of left shoulder osteoarthritis.  They were brought to the operating room on 08/30/2018 and underwent Procedure(s): TOTAL SHOULDER ARTHROPLASTY.    They were given perioperative antibiotics:  Anti-infectives (From admission, onward)   Start     Dose/Rate Route Frequency Ordered Stop   08/30/18 0800  clindamycin (CLEOCIN) IVPB 900 mg     900 mg 100 mL/hr over 30 Minutes Intravenous On call to O.R. 08/30/18 7341 08/30/18 1043    .  Patient underwent the above named procedure and tolerated it well. The following day they were hemodynamically stable and pain was controlled on oral analgesics. They were neurovascularly intact to the operative extremity. OT was ordered and worked with patient per protocol. They were medically and orthopaedically stable for discharge on 08/31/2018.    DIAGNOSTIC STUDIES:  RECENT RADIOGRAPHIC STUDIES :  No results found.  RECENT VITAL SIGNS:   No data found.Marland Kitchen  RECENT EKG RESULTS:    Orders placed or performed during the hospital encounter of 08/27/18  . EKG 12 lead  . EKG 12 lead    DISCHARGE INSTRUCTIONS:  Discharge Instructions    Call MD / Call 911   Complete by: As directed    If you experience chest pain or shortness of breath, CALL 911 and be transported to the hospital emergency room.  If you develope a fever above 101 F, pus (white drainage) or increased drainage or redness at the wound, or calf pain, call your surgeon's office.   Constipation Prevention   Complete by: As directed    Drink plenty of fluids.  Prune juice may be helpful.  You may use a stool softener, such as Colace (over the counter) 100 mg twice a day.  Use MiraLax (over the counter) for constipation as needed.   Diet - low sodium heart healthy   Complete by: As directed    Increase activity slowly as tolerated   Complete by: As directed       DISCHARGE MEDICATIONS:   Allergies as of 08/31/2018      Reactions   Cymbalta [duloxetine Hcl]    Abdominal pain   Nitrofurantoin [macrodantin]    Sulfamethoxazole Hives   Cefdinir Rash      Medication List    STOP taking these medications   HYDROcodone-acetaminophen 10-325 MG tablet Commonly known as:  NORCO     TAKE these medications   Ambien 10 MG tablet Generic drug: zolpidem Take 2.5 mg by mouth at bedtime.   amLODipine 5 MG tablet Commonly known as: NORVASC Take 1 tablet (5 mg total) by mouth daily.   B-complex with vitamin C tablet Take 1 tablet by mouth 2 (two) times a day.   BIOFREEZE EX Apply 1 application topically daily as needed (joint pain).   budesonide-formoterol 160-4.5 MCG/ACT inhaler Commonly known as: SYMBICORT Inhale 2 puffs into the lungs daily as needed (shortness of breath).   CALCIUM 1200 PO Take 2,400 mg by mouth daily.   cyclobenzaprine 10 MG tablet Commonly known as: FLEXERIL Take 1 tablet (10 mg total) by mouth 3 (three) times daily as needed for  muscle spasms.   HAIR SKIN AND NAILS FORMULA PO Take 1 tablet by mouth daily.   lamoTRIgine 150 MG tablet Commonly known as: LAMICTAL Take 150 mg by mouth at bedtime.   ondansetron 4 MG tablet Commonly known as: Zofran Take 1 tablet (4 mg total) by mouth every 8 (eight) hours as needed for nausea or vomiting.   oxyCODONE-acetaminophen 5-325 MG tablet Commonly known as: Percocet Take 1 tablet by mouth every 4 (four) hours as needed (max 6 q).   ProAir HFA 108 (90 Base) MCG/ACT inhaler Generic drug: albuterol Inhale 2 puffs into the lungs every 4 (four) hours as needed for wheezing or shortness of breath.   SALONPAS PAIN RELIEF PATCH EX Place 1 patch onto the skin daily as needed (pain).   VITAMIN D PO Take 5,000 Units by mouth daily.   zinc gluconate 50 MG tablet Take 50 mg by mouth daily.       FOLLOW UP VISIT:   Follow-up Information    Justice Britain, MD.   Specialty: Orthopedic Surgery Why: call to be seen in 10-14 days Contact information: [address] [place] [address] [phone]           DISCHARGE JP:[id number]  DISCHARGE CONDITION:  [name] for Dr. Justice Britain 09/03/2018, 3:53 PM

## 2018-09-12 DIAGNOSIS — Z471 Aftercare following joint replacement surgery: Secondary | ICD-10-CM | POA: Diagnosis not present

## 2018-09-12 DIAGNOSIS — Z96612 Presence of left artificial shoulder joint: Secondary | ICD-10-CM | POA: Diagnosis not present

## 2018-09-19 ENCOUNTER — Other Ambulatory Visit: Payer: PPO

## 2018-09-19 ENCOUNTER — Other Ambulatory Visit: Payer: Self-pay

## 2018-09-19 DIAGNOSIS — E559 Vitamin D deficiency, unspecified: Secondary | ICD-10-CM

## 2018-09-19 DIAGNOSIS — M542 Cervicalgia: Secondary | ICD-10-CM | POA: Diagnosis not present

## 2018-09-19 DIAGNOSIS — M25512 Pain in left shoulder: Secondary | ICD-10-CM | POA: Diagnosis not present

## 2018-09-19 DIAGNOSIS — M8580 Other specified disorders of bone density and structure, unspecified site: Secondary | ICD-10-CM | POA: Diagnosis not present

## 2018-09-20 LAB — VITAMIN D 25 HYDROXY (VIT D DEFICIENCY, FRACTURES): Vit D, 25-Hydroxy: 35.8 ng/mL (ref 30.0–100.0)

## 2018-09-26 DIAGNOSIS — M542 Cervicalgia: Secondary | ICD-10-CM | POA: Diagnosis not present

## 2018-09-26 DIAGNOSIS — M25512 Pain in left shoulder: Secondary | ICD-10-CM | POA: Diagnosis not present

## 2018-10-01 DIAGNOSIS — M542 Cervicalgia: Secondary | ICD-10-CM | POA: Diagnosis not present

## 2018-10-01 DIAGNOSIS — M25512 Pain in left shoulder: Secondary | ICD-10-CM | POA: Diagnosis not present

## 2018-10-02 ENCOUNTER — Telehealth: Payer: Self-pay | Admitting: Obstetrics and Gynecology

## 2018-10-02 NOTE — Telephone Encounter (Signed)
Call placed to follow up on referral. 

## 2018-10-03 DIAGNOSIS — M25512 Pain in left shoulder: Secondary | ICD-10-CM | POA: Diagnosis not present

## 2018-10-03 DIAGNOSIS — M542 Cervicalgia: Secondary | ICD-10-CM | POA: Diagnosis not present

## 2018-10-08 DIAGNOSIS — Z96612 Presence of left artificial shoulder joint: Secondary | ICD-10-CM | POA: Diagnosis not present

## 2018-10-08 DIAGNOSIS — Z471 Aftercare following joint replacement surgery: Secondary | ICD-10-CM | POA: Diagnosis not present

## 2018-10-09 DIAGNOSIS — M25512 Pain in left shoulder: Secondary | ICD-10-CM | POA: Diagnosis not present

## 2018-10-09 DIAGNOSIS — M542 Cervicalgia: Secondary | ICD-10-CM | POA: Diagnosis not present

## 2018-10-12 DIAGNOSIS — M25512 Pain in left shoulder: Secondary | ICD-10-CM | POA: Diagnosis not present

## 2018-10-12 DIAGNOSIS — M542 Cervicalgia: Secondary | ICD-10-CM | POA: Diagnosis not present

## 2018-10-15 DIAGNOSIS — M542 Cervicalgia: Secondary | ICD-10-CM | POA: Diagnosis not present

## 2018-10-15 DIAGNOSIS — M25512 Pain in left shoulder: Secondary | ICD-10-CM | POA: Diagnosis not present

## 2018-10-17 DIAGNOSIS — M25512 Pain in left shoulder: Secondary | ICD-10-CM | POA: Diagnosis not present

## 2018-10-17 DIAGNOSIS — M542 Cervicalgia: Secondary | ICD-10-CM | POA: Diagnosis not present

## 2018-10-24 ENCOUNTER — Other Ambulatory Visit: Payer: Self-pay | Admitting: Family Medicine

## 2018-10-24 DIAGNOSIS — M25512 Pain in left shoulder: Secondary | ICD-10-CM | POA: Diagnosis not present

## 2018-10-24 DIAGNOSIS — M542 Cervicalgia: Secondary | ICD-10-CM | POA: Diagnosis not present

## 2018-10-24 DIAGNOSIS — I10 Essential (primary) hypertension: Secondary | ICD-10-CM

## 2018-10-31 DIAGNOSIS — M542 Cervicalgia: Secondary | ICD-10-CM | POA: Diagnosis not present

## 2018-10-31 DIAGNOSIS — M25512 Pain in left shoulder: Secondary | ICD-10-CM | POA: Diagnosis not present

## 2018-11-06 DIAGNOSIS — M25512 Pain in left shoulder: Secondary | ICD-10-CM | POA: Diagnosis not present

## 2018-11-06 DIAGNOSIS — M542 Cervicalgia: Secondary | ICD-10-CM | POA: Diagnosis not present

## 2018-11-08 DIAGNOSIS — M25512 Pain in left shoulder: Secondary | ICD-10-CM | POA: Diagnosis not present

## 2018-11-08 DIAGNOSIS — M542 Cervicalgia: Secondary | ICD-10-CM | POA: Diagnosis not present

## 2018-11-16 DIAGNOSIS — M25512 Pain in left shoulder: Secondary | ICD-10-CM | POA: Diagnosis not present

## 2018-11-16 DIAGNOSIS — M542 Cervicalgia: Secondary | ICD-10-CM | POA: Diagnosis not present

## 2018-11-20 DIAGNOSIS — M25512 Pain in left shoulder: Secondary | ICD-10-CM | POA: Diagnosis not present

## 2018-11-20 DIAGNOSIS — M542 Cervicalgia: Secondary | ICD-10-CM | POA: Diagnosis not present

## 2018-11-22 DIAGNOSIS — M25512 Pain in left shoulder: Secondary | ICD-10-CM | POA: Diagnosis not present

## 2018-11-22 DIAGNOSIS — M542 Cervicalgia: Secondary | ICD-10-CM | POA: Diagnosis not present

## 2018-11-27 ENCOUNTER — Encounter: Payer: Self-pay | Admitting: *Deleted

## 2018-11-27 DIAGNOSIS — M542 Cervicalgia: Secondary | ICD-10-CM | POA: Diagnosis not present

## 2018-11-27 DIAGNOSIS — M25512 Pain in left shoulder: Secondary | ICD-10-CM | POA: Diagnosis not present

## 2018-11-29 DIAGNOSIS — M25512 Pain in left shoulder: Secondary | ICD-10-CM | POA: Diagnosis not present

## 2018-11-29 DIAGNOSIS — M542 Cervicalgia: Secondary | ICD-10-CM | POA: Diagnosis not present

## 2018-12-03 DIAGNOSIS — M542 Cervicalgia: Secondary | ICD-10-CM | POA: Diagnosis not present

## 2018-12-03 DIAGNOSIS — M25512 Pain in left shoulder: Secondary | ICD-10-CM | POA: Diagnosis not present

## 2018-12-06 DIAGNOSIS — M542 Cervicalgia: Secondary | ICD-10-CM | POA: Diagnosis not present

## 2018-12-06 DIAGNOSIS — M25512 Pain in left shoulder: Secondary | ICD-10-CM | POA: Diagnosis not present

## 2018-12-10 DIAGNOSIS — M25512 Pain in left shoulder: Secondary | ICD-10-CM | POA: Diagnosis not present

## 2018-12-10 DIAGNOSIS — M542 Cervicalgia: Secondary | ICD-10-CM | POA: Diagnosis not present

## 2018-12-19 DIAGNOSIS — M542 Cervicalgia: Secondary | ICD-10-CM | POA: Diagnosis not present

## 2018-12-19 DIAGNOSIS — M25512 Pain in left shoulder: Secondary | ICD-10-CM | POA: Diagnosis not present

## 2018-12-24 DIAGNOSIS — M25512 Pain in left shoulder: Secondary | ICD-10-CM | POA: Diagnosis not present

## 2018-12-24 DIAGNOSIS — M542 Cervicalgia: Secondary | ICD-10-CM | POA: Diagnosis not present

## 2018-12-27 DIAGNOSIS — M542 Cervicalgia: Secondary | ICD-10-CM | POA: Diagnosis not present

## 2018-12-27 DIAGNOSIS — M25512 Pain in left shoulder: Secondary | ICD-10-CM | POA: Diagnosis not present

## 2018-12-31 DIAGNOSIS — M542 Cervicalgia: Secondary | ICD-10-CM | POA: Diagnosis not present

## 2018-12-31 DIAGNOSIS — M25512 Pain in left shoulder: Secondary | ICD-10-CM | POA: Diagnosis not present

## 2019-01-02 DIAGNOSIS — M542 Cervicalgia: Secondary | ICD-10-CM | POA: Diagnosis not present

## 2019-01-02 DIAGNOSIS — M25512 Pain in left shoulder: Secondary | ICD-10-CM | POA: Diagnosis not present

## 2019-01-08 DIAGNOSIS — M542 Cervicalgia: Secondary | ICD-10-CM | POA: Diagnosis not present

## 2019-01-08 DIAGNOSIS — M25512 Pain in left shoulder: Secondary | ICD-10-CM | POA: Diagnosis not present

## 2019-01-10 DIAGNOSIS — M542 Cervicalgia: Secondary | ICD-10-CM | POA: Diagnosis not present

## 2019-01-10 DIAGNOSIS — M25512 Pain in left shoulder: Secondary | ICD-10-CM | POA: Diagnosis not present

## 2019-01-15 DIAGNOSIS — Z79899 Other long term (current) drug therapy: Secondary | ICD-10-CM | POA: Diagnosis not present

## 2019-01-15 DIAGNOSIS — G894 Chronic pain syndrome: Secondary | ICD-10-CM | POA: Diagnosis not present

## 2019-01-15 DIAGNOSIS — M542 Cervicalgia: Secondary | ICD-10-CM | POA: Diagnosis not present

## 2019-01-15 DIAGNOSIS — M4302 Spondylolysis, cervical region: Secondary | ICD-10-CM | POA: Diagnosis not present

## 2019-01-15 DIAGNOSIS — M7918 Myalgia, other site: Secondary | ICD-10-CM | POA: Diagnosis not present

## 2019-01-15 DIAGNOSIS — M25512 Pain in left shoulder: Secondary | ICD-10-CM | POA: Diagnosis not present

## 2019-01-15 DIAGNOSIS — M4322 Fusion of spine, cervical region: Secondary | ICD-10-CM | POA: Diagnosis not present

## 2019-01-17 DIAGNOSIS — M542 Cervicalgia: Secondary | ICD-10-CM | POA: Diagnosis not present

## 2019-01-17 DIAGNOSIS — M25512 Pain in left shoulder: Secondary | ICD-10-CM | POA: Diagnosis not present

## 2019-01-29 DIAGNOSIS — M25512 Pain in left shoulder: Secondary | ICD-10-CM | POA: Diagnosis not present

## 2019-01-29 DIAGNOSIS — M542 Cervicalgia: Secondary | ICD-10-CM | POA: Diagnosis not present

## 2019-01-31 DIAGNOSIS — H1789 Other corneal scars and opacities: Secondary | ICD-10-CM | POA: Diagnosis not present

## 2019-01-31 DIAGNOSIS — H5211 Myopia, right eye: Secondary | ICD-10-CM | POA: Diagnosis not present

## 2019-01-31 DIAGNOSIS — H52223 Regular astigmatism, bilateral: Secondary | ICD-10-CM | POA: Diagnosis not present

## 2019-01-31 DIAGNOSIS — Z961 Presence of intraocular lens: Secondary | ICD-10-CM | POA: Diagnosis not present

## 2019-01-31 DIAGNOSIS — H524 Presbyopia: Secondary | ICD-10-CM | POA: Diagnosis not present

## 2019-01-31 DIAGNOSIS — H5202 Hypermetropia, left eye: Secondary | ICD-10-CM | POA: Diagnosis not present

## 2019-01-31 DIAGNOSIS — H04123 Dry eye syndrome of bilateral lacrimal glands: Secondary | ICD-10-CM | POA: Diagnosis not present

## 2019-02-01 ENCOUNTER — Other Ambulatory Visit: Payer: Self-pay | Admitting: Family Medicine

## 2019-02-01 DIAGNOSIS — I10 Essential (primary) hypertension: Secondary | ICD-10-CM

## 2019-02-05 DIAGNOSIS — M542 Cervicalgia: Secondary | ICD-10-CM | POA: Diagnosis not present

## 2019-02-05 DIAGNOSIS — M25512 Pain in left shoulder: Secondary | ICD-10-CM | POA: Diagnosis not present

## 2019-02-07 DIAGNOSIS — M542 Cervicalgia: Secondary | ICD-10-CM | POA: Diagnosis not present

## 2019-02-07 DIAGNOSIS — M25512 Pain in left shoulder: Secondary | ICD-10-CM | POA: Diagnosis not present

## 2019-02-12 DIAGNOSIS — M25512 Pain in left shoulder: Secondary | ICD-10-CM | POA: Diagnosis not present

## 2019-02-12 DIAGNOSIS — M542 Cervicalgia: Secondary | ICD-10-CM | POA: Diagnosis not present

## 2019-02-15 DIAGNOSIS — Z471 Aftercare following joint replacement surgery: Secondary | ICD-10-CM | POA: Diagnosis not present

## 2019-02-15 DIAGNOSIS — Z96612 Presence of left artificial shoulder joint: Secondary | ICD-10-CM | POA: Diagnosis not present

## 2019-02-18 DIAGNOSIS — M25512 Pain in left shoulder: Secondary | ICD-10-CM | POA: Diagnosis not present

## 2019-02-18 DIAGNOSIS — M542 Cervicalgia: Secondary | ICD-10-CM | POA: Diagnosis not present

## 2019-02-25 DIAGNOSIS — Z961 Presence of intraocular lens: Secondary | ICD-10-CM | POA: Diagnosis not present

## 2019-02-25 DIAGNOSIS — H26491 Other secondary cataract, right eye: Secondary | ICD-10-CM | POA: Diagnosis not present

## 2019-02-25 DIAGNOSIS — H26493 Other secondary cataract, bilateral: Secondary | ICD-10-CM | POA: Diagnosis not present

## 2019-02-26 NOTE — Progress Notes (Signed)
Chief Complaint  Patient presents with  . Hypertension    med check, was having ankle swelling until about November-it has resolved.    Hypertension follow-up: She has been taking amlodipine 5mg  daily.  She previously noticed some swelling in her ankles late in the afternoon (around 4pm), which by morning had resolved.  In the last month or so she hasn't noticed any swelling. BP's have been running 130's/70's. Denies headaches, dizziness, chest pain, palpitations.   Vitamin D was 25.1 last year when checked by her GYN.  OTC vitamin D 3 dose was increased from 2000 IU to 5000 IU. Recheck on 5000 IU daily in July 2020 was 35.8. She continues to take 5000 IU daily.  Osteopenia.  Repeat DEXA 11/2017 was stable, elevated FRAX score for hip fracture.  GYN originally referred to endocrinology for this, but I said I'd be happy to follow. She apparently was put on fosamax and didn't like it--started by her GYN after her last DEXA (11/2017). Some of her joint pains improved after stopping it.  She also had noted hair loss (med started around the same time as losartan, she wasn't sure which med contributed). They had discussed Evista as an alternative in the past (per GYN's notes, pt doesn't recall this). At her wellness visit 6 months ago we discussed Reclast, Boniva/Actonel, Raloxifene. Not candidate for Prolia (not osteoporosis yet); Discussed Ca, D, wt bearing exercise.   Chronic neck pain. She sees Dr. Patrice Paradise (or his PA) every 6 months, where she gets her hydrocodone (takes 1/2 pill every other day). SalonPas, topical meds and massage help some. Has used robaxin in the past as well--she reports robaxin makes her feel weird, doesn't like taking them, only helps a little. Pain no longer radiates into her arm. She had L shoulder replacement by Dr. Onnie Graham on 08/30/2018.  She has had ongoing PT since then (not just through September as Epic shows)--she has just one painful spot in her left side of her neck.  She thinks some of the weights she did in PT flared it some.  PMH, PSH, SH reviewed  Outpatient Encounter Medications as of 02/27/2019  Medication Sig Note  . amLODipine (NORVASC) 5 MG tablet TAKE 1 TABLET BY MOUTH EVERY DAY   . B Complex-C (B-COMPLEX WITH VITAMIN C) tablet Take 1 tablet by mouth 2 (two) times a day. 08/27/2018: Takes B-complex 100 once daily  . Calcium Carbonate-Vit D-Min (CALCIUM 1200 PO) Take 2,400 mg by mouth daily.    . diclofenac Sodium (VOLTAREN) 1 % GEL Apply topically 4 (four) times daily.   Marland Kitchen HYDROcodone-acetaminophen (NORCO/VICODIN) 5-325 MG tablet Take 0.5 tablets by mouth every 6 (six) hours as needed for moderate pain. 02/27/2019: Uses 1/2 tablet about every other day  . lamoTRIgine (LAMICTAL) 150 MG tablet Take 150 mg by mouth at bedtime.     . Liniments (SALONPAS PAIN RELIEF PATCH EX) Place 1 patch onto the skin daily as needed (pain).   . Multiple Vitamins-Minerals (HAIR SKIN AND NAILS FORMULA PO) Take 1 tablet by mouth daily.   Marland Kitchen VITAMIN D PO Take 5,000 Units by mouth daily.    Marland Kitchen zolpidem (AMBIEN) 10 MG tablet Take 2.5 mg by mouth at bedtime.    . budesonide-formoterol (SYMBICORT) 160-4.5 MCG/ACT inhaler Inhale 2 puffs into the lungs daily as needed (shortness of breath).  02/27/2019: Uses it only prn, if she notices a "wheezy cough"; works better than albuterol for prn use  . PROAIR HFA 108 (90 Base) MCG/ACT inhaler  Inhale 2 puffs into the lungs every 4 (four) hours as needed for wheezing or shortness of breath. (Patient not taking: Reported on 02/27/2019)   . zinc gluconate 50 MG tablet Take 50 mg by mouth daily.   . [DISCONTINUED] cyclobenzaprine (FLEXERIL) 10 MG tablet Take 1 tablet (10 mg total) by mouth 3 (three) times daily as needed for muscle spasms.   . [DISCONTINUED] Menthol, Topical Analgesic, (BIOFREEZE EX) Apply 1 application topically daily as needed (joint pain).   . [DISCONTINUED] ondansetron (ZOFRAN) 4 MG tablet Take 1 tablet (4 mg total) by  mouth every 8 (eight) hours as needed for nausea or vomiting.   . [DISCONTINUED] oxyCODONE-acetaminophen (PERCOCET) 5-325 MG tablet Take 1 tablet by mouth every 4 (four) hours as needed (max 6 q).    No facility-administered encounter medications on file as of 02/27/2019.   Allergies  Allergen Reactions  . Cymbalta [Duloxetine Hcl]     Abdominal pain  . Nitrofurantoin [Macrodantin]   . Sulfamethoxazole Hives  . Cefdinir Rash    ROS:  No headache, dizziness, chest pain, shortness of breath.  No fever, chills, URI symptoms.  Breathing has been good--only rarely uses inhaler (2x in the last month).   +chronic pain, per HPI, at lef tneck. No GI or GU complaints. Edema has improved/resolved. Wakes up with some charlie horses in her calves, relieved by mustard.  This happens once or twice a week.   PHYSICAL EXAM:  BP 130/78   Pulse 72   Temp 97.8 F (36.6 C) (Other (Comment))   Ht 5' 1.5" (1.562 m)   Wt 127 lb 12.8 oz (58 kg)   LMP 08/15/1988 (Exact Date)   BMI 23.76 kg/m   Wt Readings from Last 3 Encounters:  02/27/19 127 lb 12.8 oz (58 kg)  08/30/18 127 lb 4 oz (57.7 kg)  08/27/18 127 lb 4 oz (57.7 kg)   Well appearing pleasant female in no distress.  HEENT: conjunctiva and sclera are clear, EOMI. Wearing mask  Neck: no lymphadenopathy, thyromegaly or mass. No spinal tenderness. WHSS.  She has a tight, tender muscle in the L paraspinous area. Heart: regular rate and rhythm Lungs: clear bilaterally Back: no spinal or CVA tenderness Abdomen: soft, nontender, no mass Extremities: no edema Psych: normal mood, affect, hygiene, grooming, eye contact, speech.   Neuro: alert and oriented, normal gait.   ASSESSMENT/PLAN:  Essential hypertension - controlled - Plan: Comprehensive metabolic panel  Vitamin D deficiency - cont 5000 IU daily; plan to recheck at her physical  Osteopenia with high risk of fracture - discussed Ca (likely taking too much from pills currently), D,  weight-bearing exercise and meds. Prefers to wait to repeat DEXA 11/2019 rather than meds now  Chronic neck pain - component of muscle spasm.  Heat, massage, stretches and PT.   Lipids with next wellness visit (will have been 5 year)  AWV/CPE in 6 months, with fasting labs prior

## 2019-02-27 ENCOUNTER — Encounter: Payer: Self-pay | Admitting: Family Medicine

## 2019-02-27 ENCOUNTER — Telehealth: Payer: Self-pay | Admitting: Family Medicine

## 2019-02-27 ENCOUNTER — Other Ambulatory Visit: Payer: Self-pay

## 2019-02-27 ENCOUNTER — Ambulatory Visit (INDEPENDENT_AMBULATORY_CARE_PROVIDER_SITE_OTHER): Payer: PPO | Admitting: Family Medicine

## 2019-02-27 VITALS — BP 130/78 | HR 72 | Temp 97.8°F | Ht 61.5 in | Wt 127.8 lb

## 2019-02-27 DIAGNOSIS — G8929 Other chronic pain: Secondary | ICD-10-CM | POA: Diagnosis not present

## 2019-02-27 DIAGNOSIS — M8580 Other specified disorders of bone density and structure, unspecified site: Secondary | ICD-10-CM

## 2019-02-27 DIAGNOSIS — Z13220 Encounter for screening for lipoid disorders: Secondary | ICD-10-CM

## 2019-02-27 DIAGNOSIS — E559 Vitamin D deficiency, unspecified: Secondary | ICD-10-CM | POA: Diagnosis not present

## 2019-02-27 DIAGNOSIS — M542 Cervicalgia: Secondary | ICD-10-CM

## 2019-02-27 DIAGNOSIS — Z5181 Encounter for therapeutic drug level monitoring: Secondary | ICD-10-CM

## 2019-02-27 DIAGNOSIS — I10 Essential (primary) hypertension: Secondary | ICD-10-CM

## 2019-02-27 NOTE — Patient Instructions (Addendum)
Check your calcium bottle to see how many milligrams are in each tablet. You don't want to get too much calcium (can cause kidney stones, be bad for the heart, etc). The goal is to get between 1200-1500mg  of calcium per day from ALL SOURCES, including any other vitamins, and your diet (milk, cheese, yogurt, greens, etc). If you need to take it in pill form, because you don't get much from your diet, you need to separate them.  Take 600mg  twice daily, not both at the same time, as it doesn't absorb well that way.   We reviewed the medications to treat bones again, and at this point elected to hold off until 11/2019 and repeat the bone density test (rather than starting any medication right now).  Please try and get weight-bearing exercise as we discussed, at least 2-3 times/week, as this also helps protect your bones from loss.  Please be sure to stay well hydrated (urine should be very light yellow, almost clear), in order to try and prevent leg cramps.    Osteopenia  Osteopenia is a loss of thickness (density) inside of the bones. Another name for osteopenia is low bone mass. Mild osteopenia is a normal part of aging. It is not a disease, and it does not cause symptoms. However, if you have osteopenia and continue to lose bone mass, you could develop a condition that causes the bones to become thin and break more easily (osteoporosis). You may also lose some height, have back pain, and have a stooped posture. Although osteopenia is not a disease, making changes to your lifestyle and diet can help to prevent osteopenia from developing into osteoporosis. What are the causes? Osteopenia is caused by loss of calcium in the bones.  Bones are constantly changing. Old bone cells are continually being replaced with new bone cells. This process builds new bone. The mineral calcium is needed to build new bone and maintain bone density. Bone density is usually highest around age 75. After that, most  people's bodies cannot replace all the bone they have lost with new bone. What increases the risk? You are more likely to develop this condition if:  You are older than age 64.  You are a woman who went through menopause early.  You have a long illness that keeps you in bed.  You do not get enough exercise.  You lack certain nutrients (malnutrition).  You have an overactive thyroid gland (hyperthyroidism).  You smoke.  You drink a lot of alcohol.  You are taking medicines that weaken the bones, such as steroids. What are the signs or symptoms? This condition does not cause any symptoms. You may have a slightly higher risk for bone breaks (fractures), so getting fractures more easily than normal may be an indication of osteopenia. How is this diagnosed? Your health care provider can diagnose this condition with a special type of X-ray exam that measures bone density (dual-energy X-ray absorptiometry, DEXA). This test can measure bone density in your hips, spine, and wrists. Osteopenia has no symptoms, so this condition is usually diagnosed after a routine bone density screening test is done for osteoporosis. This routine screening is usually done for:  Women who are age 8 or older.  Men who are age 43 or older. If you have risk factors for osteopenia, you may have the screening test at an earlier age. How is this treated? Making dietary and lifestyle changes can lower your risk for osteoporosis. If you have severe osteopenia that is  close to becoming osteoporosis, your health care provider may prescribe medicines and dietary supplements such as calcium and vitamin D. These supplements help to rebuild bone density. Follow these instructions at home:   Take over-the-counter and prescription medicines only as told by your health care provider. These include vitamins and supplements.  Eat a diet that is high in calcium and vitamin D. ? Calcium is found in dairy products, beans,  salmon, and leafy green vegetables like spinach and broccoli. ? Look for foods that have vitamin D and calcium added to them (fortified foods), such as orange juice, cereal, and bread.  Do 30 or more minutes of a weight-bearing exercise every day, such as walking, jogging, or playing a sport. These types of exercises strengthen the bones.  Take precautions at home to lower your risk of falling, such as: ? Keeping rooms well-lit and free of clutter, such as cords. ? Installing safety rails on stairs. ? Using rubber mats in the bathroom or other areas that are often wet or slippery.  Do not use any products that contain nicotine or tobacco, such as cigarettes and e-cigarettes. If you need help quitting, ask your health care provider.  Avoid alcohol or limit alcohol intake to no more than 1 drink a day for nonpregnant women and 2 drinks a day for men. One drink equals 12 oz of beer, 5 oz of wine, or 1 oz of hard liquor.  Keep all follow-up visits as told by your health care provider. This is important. Contact a health care provider if:  You have not had a bone density screening for osteoporosis and you are: ? A woman, age 23 or older. ? A man, age 63 or older.  You are a postmenopausal woman who has not had a bone density screening for osteoporosis.  You are older than age 50 and you want to know if you should have bone density screening for osteoporosis. Summary  Osteopenia is a loss of thickness (density) inside of the bones. Another name for osteopenia is low bone mass.  Osteopenia is not a disease, but it may increase your risk for a condition that causes the bones to become thin and break more easily (osteoporosis).  You may be at risk for osteopenia if you are older than age 23 or if you are a woman who went through early menopause.  Osteopenia does not cause any symptoms, but it can be diagnosed with a bone density screening test.  Dietary and lifestyle changes are the first  treatment for osteopenia. These may lower your risk for osteoporosis. This information is not intended to replace advice given to you by your health care provider. Make sure you discuss any questions you have with your health care provider. Document Released: 11/23/2016 Document Revised: 01/27/2017 Document Reviewed: 11/23/2016 Elsevier Patient Education  2020 [employer].

## 2019-02-27 NOTE — Telephone Encounter (Signed)
Orders entered

## 2019-02-27 NOTE — Telephone Encounter (Signed)
Pt is doing afternoon wellness appt on 09/04/2019 and would like to come in on 07/02 for lab work

## 2019-02-28 LAB — COMPREHENSIVE METABOLIC PANEL
ALT: 19 IU/L (ref 0–32)
AST: 21 IU/L (ref 0–40)
Albumin/Globulin Ratio: 2.2 (ref 1.2–2.2)
Albumin: 4.4 g/dL (ref 3.7–4.7)
Alkaline Phosphatase: 97 IU/L (ref 39–117)
BUN/Creatinine Ratio: 24 (ref 12–28)
BUN: 15 mg/dL (ref 8–27)
Bilirubin Total: <0.2 mg/dL (ref 0.0–1.2)
CO2: 25 mmol/L (ref 20–29)
Calcium: 9.3 mg/dL (ref 8.7–10.3)
Chloride: 101 mmol/L (ref 96–106)
Creatinine, Ser: 0.63 mg/dL (ref 0.57–1.00)
GFR calc Af Amer: 101 mL/min/{1.73_m2} (ref >59)
GFR calc non Af Amer: 88 mL/min/{1.73_m2} (ref >59)
Globulin, Total: 2 g/dL (ref 1.5–4.5)
Glucose: 89 mg/dL (ref 65–99)
Potassium: 3.9 mmol/L (ref 3.5–5.2)
Sodium: 139 mmol/L (ref 134–144)
Total Protein: 6.4 g/dL (ref 6.0–8.5)

## 2019-03-04 DIAGNOSIS — Z96642 Presence of left artificial hip joint: Secondary | ICD-10-CM | POA: Diagnosis not present

## 2019-03-04 DIAGNOSIS — M7062 Trochanteric bursitis, left hip: Secondary | ICD-10-CM | POA: Diagnosis not present

## 2019-03-05 DIAGNOSIS — M542 Cervicalgia: Secondary | ICD-10-CM | POA: Diagnosis not present

## 2019-03-05 DIAGNOSIS — M25512 Pain in left shoulder: Secondary | ICD-10-CM | POA: Diagnosis not present

## 2019-03-07 DIAGNOSIS — M25512 Pain in left shoulder: Secondary | ICD-10-CM | POA: Diagnosis not present

## 2019-03-07 DIAGNOSIS — M542 Cervicalgia: Secondary | ICD-10-CM | POA: Diagnosis not present

## 2019-03-07 DIAGNOSIS — Z9841 Cataract extraction status, right eye: Secondary | ICD-10-CM | POA: Diagnosis not present

## 2019-03-11 ENCOUNTER — Telehealth: Payer: Self-pay

## 2019-03-11 NOTE — Telephone Encounter (Signed)
Pt. Aware it is ok for her to get her COVID this Friday since her cortisone shot was not an IM injection and was for her jooints.

## 2019-03-11 NOTE — Telephone Encounter (Signed)
Pt. Called stating that she got a cortisone shot on 03/04/19 and is scheduled to get a COVID shot on 03/15/19, she wants to know if you think its ok for her to get the COVID shot that close to getting a cortisone shot.

## 2019-03-11 NOTE — Telephone Encounter (Signed)
I think they ask for you to wait 2 weeks after a VACCINE before getting the COVID vaccine. Since the cortisone shot was likely localized (not IM, but into a joint, or bursa--message didn't say where), then should be okay.  If cortisone shot wasn't localized (ie was IM injection), then I'd wait 2 weeks also

## 2019-03-13 DIAGNOSIS — M25512 Pain in left shoulder: Secondary | ICD-10-CM | POA: Diagnosis not present

## 2019-03-13 DIAGNOSIS — M542 Cervicalgia: Secondary | ICD-10-CM | POA: Diagnosis not present

## 2019-03-15 DIAGNOSIS — M25512 Pain in left shoulder: Secondary | ICD-10-CM | POA: Diagnosis not present

## 2019-03-15 DIAGNOSIS — M542 Cervicalgia: Secondary | ICD-10-CM | POA: Diagnosis not present

## 2019-03-19 DIAGNOSIS — M542 Cervicalgia: Secondary | ICD-10-CM | POA: Diagnosis not present

## 2019-03-19 DIAGNOSIS — M25512 Pain in left shoulder: Secondary | ICD-10-CM | POA: Diagnosis not present

## 2019-03-21 DIAGNOSIS — M542 Cervicalgia: Secondary | ICD-10-CM | POA: Diagnosis not present

## 2019-03-21 DIAGNOSIS — M25512 Pain in left shoulder: Secondary | ICD-10-CM | POA: Diagnosis not present

## 2019-03-25 DIAGNOSIS — M25512 Pain in left shoulder: Secondary | ICD-10-CM | POA: Diagnosis not present

## 2019-03-25 DIAGNOSIS — M542 Cervicalgia: Secondary | ICD-10-CM | POA: Diagnosis not present

## 2019-03-27 DIAGNOSIS — M25512 Pain in left shoulder: Secondary | ICD-10-CM | POA: Diagnosis not present

## 2019-03-27 DIAGNOSIS — M542 Cervicalgia: Secondary | ICD-10-CM | POA: Diagnosis not present

## 2019-03-28 DIAGNOSIS — M4302 Spondylolysis, cervical region: Secondary | ICD-10-CM | POA: Diagnosis not present

## 2019-03-28 DIAGNOSIS — M47812 Spondylosis without myelopathy or radiculopathy, cervical region: Secondary | ICD-10-CM | POA: Diagnosis not present

## 2019-03-28 DIAGNOSIS — M4312 Spondylolisthesis, cervical region: Secondary | ICD-10-CM | POA: Diagnosis not present

## 2019-03-28 DIAGNOSIS — Z6821 Body mass index (BMI) 21.0-21.9, adult: Secondary | ICD-10-CM | POA: Diagnosis not present

## 2019-04-02 DIAGNOSIS — M25512 Pain in left shoulder: Secondary | ICD-10-CM | POA: Diagnosis not present

## 2019-04-02 DIAGNOSIS — M542 Cervicalgia: Secondary | ICD-10-CM | POA: Diagnosis not present

## 2019-04-04 DIAGNOSIS — M542 Cervicalgia: Secondary | ICD-10-CM | POA: Diagnosis not present

## 2019-04-04 DIAGNOSIS — M25512 Pain in left shoulder: Secondary | ICD-10-CM | POA: Diagnosis not present

## 2019-04-08 DIAGNOSIS — M25562 Pain in left knee: Secondary | ICD-10-CM | POA: Insufficient documentation

## 2019-04-08 DIAGNOSIS — F3342 Major depressive disorder, recurrent, in full remission: Secondary | ICD-10-CM | POA: Diagnosis not present

## 2019-04-09 DIAGNOSIS — M2392 Unspecified internal derangement of left knee: Secondary | ICD-10-CM | POA: Diagnosis not present

## 2019-04-09 DIAGNOSIS — M25562 Pain in left knee: Secondary | ICD-10-CM | POA: Diagnosis not present

## 2019-04-09 DIAGNOSIS — M7062 Trochanteric bursitis, left hip: Secondary | ICD-10-CM | POA: Diagnosis not present

## 2019-04-09 DIAGNOSIS — M238X2 Other internal derangements of left knee: Secondary | ICD-10-CM | POA: Diagnosis not present

## 2019-04-17 ENCOUNTER — Ambulatory Visit: Payer: PPO | Admitting: Family Medicine

## 2019-04-22 DIAGNOSIS — M47812 Spondylosis without myelopathy or radiculopathy, cervical region: Secondary | ICD-10-CM | POA: Diagnosis not present

## 2019-04-23 NOTE — Progress Notes (Signed)
Chief Complaint  Patient presents with  . Consult    has burning in her chest and burping more often. Has a pain her right arm and as been taking potassium for it (muscle ache). Wondered if it the potassium casued it, but did stop taking it and still happening.     Patient presents with complaint of chest burning and belching.  She always has had some burping and constipation.  Started burping more 3-4 weeks ago.  She also continues to have cramps in her legs. A friend said K+ helped, so she started taking a supplement at night.  2 weeks ago she started with burning in her chest.  Got concerned she could have potassium esophagitis (she looked it up).  She took the K+ for about a week, hasn't taken it for about a week, and doesn't really think it is much better.  She does report that the burning has gotten better, occurs once a day where the burning is "really bad", usually after eating.  She has some pain with eating and swallowing, burns worse after eating.  No pain before meals.  Not causing her any problems at night (just the cramps in her legs).  Denies any changes in her diet, same as always.  1 cup of coffee, small amount of chocolate at night. No spicy foods.  +tomato sauce on spaghetti occasionally.  Had chili earlier this week, had symptoms, but no worse than with other foods. No problems with exercise.  She saw Dr. Alvan Dame earlier this month for L knee pain and L trochanteric bursitis.  Injections were performed. Knee and hip are feeling better. She was not prescribed any NSAIDS or change in meds.  She is having a pain at her right medial elbow, radiates to the medial forearm, and then the right index finger triggers (other fingers are straight), index finger is painful, like a big cramp, and she needs to manually straighten the finger. This started about a month ago, occurs 3x/week, only once in a day.  She tries to drink plenty of water.  Doesn't report urine is dark unless she takes  her vitamins.    PMH, PSH, SH reviewed  Outpatient Encounter Medications as of 04/24/2019  Medication Sig Note  . amLODipine (NORVASC) 5 MG tablet TAKE 1 TABLET BY MOUTH EVERY DAY   . B Complex-C (B-COMPLEX WITH VITAMIN C) tablet Take 1 tablet by mouth 2 (two) times a day. 08/27/2018: Takes B-complex 100 once daily  . Calcium Carbonate-Vit D-Min (CALCIUM 1200 PO) Take 2,400 mg by mouth daily.  02/27/2019: Unsure of dose, takes 2 together.  . diclofenac Sodium (VOLTAREN) 1 % GEL Apply topically 4 (four) times daily.   Marland Kitchen HYDROcodone-acetaminophen (NORCO/VICODIN) 5-325 MG tablet Take 0.5 tablets by mouth every 6 (six) hours as needed for moderate pain. 02/27/2019: Uses 1/2 tablet about every other day  . lamoTRIgine (LAMICTAL) 150 MG tablet Take 150 mg by mouth at bedtime.     . Liniments (SALONPAS PAIN RELIEF PATCH EX) Place 1 patch onto the skin daily as needed (pain).   . Multiple Vitamins-Minerals (HAIR SKIN AND NAILS FORMULA PO) Take 1 tablet by mouth daily.   Marland Kitchen VITAMIN D PO Take 5,000 Units by mouth daily.    Marland Kitchen zinc gluconate 50 MG tablet Take 50 mg by mouth daily.   Marland Kitchen zolpidem (AMBIEN) 10 MG tablet Take 2.5 mg by mouth at bedtime.    . budesonide-formoterol (SYMBICORT) 160-4.5 MCG/ACT inhaler Inhale 2 puffs into the lungs daily  as needed (shortness of breath).  02/27/2019: Uses it only prn, if she notices a "wheezy cough"; works better than albuterol for prn use  . PROAIR HFA 108 (90 Base) MCG/ACT inhaler Inhale 2 puffs into the lungs every 4 (four) hours as needed for wheezing or shortness of breath. (Patient not taking: Reported on 02/27/2019)    No facility-administered encounter medications on file as of 04/24/2019.   Allergies  Allergen Reactions  . Cymbalta [Duloxetine Hcl]     Abdominal pain  . Nitrofurantoin [Macrodantin]   . Sulfamethoxazole Hives  . Cefdinir Rash   ROS:  No fever, chills, URI symptoms, headaches.  Chest burning and R elbow/hand issues per HPI.  No nausea,  vomiting, bowel changes, abdominal pain, bleeding, bruising, or other concerns except as noted in HPI.   PHYSICAL EXAM:  BP 112/72   Pulse 80   Temp 97.8 F (36.6 C) (Other (Comment))   Ht 5' 1.5" (1.562 m)   Wt 125 lb 12.8 oz (57.1 kg)   LMP 08/15/1988 (Exact Date)   BMI 23.38 kg/m    Wt Readings from Last 3 Encounters:  02/27/19 127 lb 12.8 oz (58 kg)  08/30/18 127 lb 4 oz (57.7 kg)  08/27/18 127 lb 4 oz (57.7 kg)   Pleasant, well-appearing female, in no distress HEENT: conjunctiva and sclera are clear, EOMI. Wearing mask Neck: no lymphadenopathy or mass Heart: regular rate and rhythm, no murmur Lungs: clear bilaterally Back: no CVA tenderness Chest: no costochondral tenderness Abdomen: soft, nontender, no organomegaly or mass Extremities: no edema.  She has some tenderness in the muscle in the R upper arm, above the medial epicondyle.  Epicondyle is nontender.   There is no triggering of index finger noted during the visit, but she did have some discomfort in the middle finger with palpation of ulnar nerve. Neuro: alert and oriented, normal sensation, strength Negative Phalen test.  EKG:  NSR rate 61.  Compared to 07/2018--aVL and aVF might be switched??  RW progression appears more normal on today's than Topaz's.  No acute changes.  Urine:  SG 1.015   ASSESSMENT/PLAN:  Chest pain, unspecified type - suspect esophagitis vs GERD.  Will have her take 2 weeks of Prilosec OTC, and contact if not improving. Avoid NSAIDs. Proper diet reviewed, and red flag sx - Plan: EKG 12-Lead  Muscle cramping - electrolytes prev checked and normal, no need for K+ supplement. Encouraged adequate fluid intake (likely contributing) - Plan: POCT Urinalysis DIP (Proadvantage Device)  Pain of right upper extremity - some tenderness in muscle, poss also irritation of nerve; unclear how that connects to index finger. f/u with ortho if ongoing issues  She got COVID vaccines, chart updated with  dates.   Please get Prilosec OTC 20 mg and take it once daily before breakfast. Take this daily for 2 weeks.  Let us know if your symptoms do not completely resolve.  Stop taking potassium--you do not need it.

## 2019-04-24 ENCOUNTER — Encounter: Payer: Self-pay | Admitting: Family Medicine

## 2019-04-24 ENCOUNTER — Other Ambulatory Visit: Payer: Self-pay

## 2019-04-24 ENCOUNTER — Ambulatory Visit (INDEPENDENT_AMBULATORY_CARE_PROVIDER_SITE_OTHER): Payer: PPO | Admitting: Family Medicine

## 2019-04-24 VITALS — BP 112/72 | HR 80 | Temp 97.8°F | Ht 61.5 in | Wt 125.8 lb

## 2019-04-24 DIAGNOSIS — M79601 Pain in right arm: Secondary | ICD-10-CM

## 2019-04-24 DIAGNOSIS — R252 Cramp and spasm: Secondary | ICD-10-CM | POA: Diagnosis not present

## 2019-04-24 DIAGNOSIS — R079 Chest pain, unspecified: Secondary | ICD-10-CM

## 2019-04-24 LAB — POCT URINALYSIS DIP (PROADVANTAGE DEVICE)
Bilirubin, UA: NEGATIVE
Blood, UA: NEGATIVE
Glucose, UA: NEGATIVE mg/dL
Ketones, POC UA: NEGATIVE mg/dL
Nitrite, UA: NEGATIVE
Protein Ur, POC: NEGATIVE mg/dL
Specific Gravity, Urine: 1.015
Urobilinogen, Ur: NEGATIVE
pH, UA: 7 (ref 5.0–8.0)

## 2019-04-24 NOTE — Patient Instructions (Signed)
Please get Prilosec OTC 20 mg and take it once daily before breakfast. Take this daily for 2 weeks.  Let us know if your symptoms do not completely resolve.  Stop taking potassium--you do not need it.   Food Choices for Gastroesophageal Reflux Disease, Adult When you have gastroesophageal reflux disease (GERD), the foods you eat and your eating habits are very important. Choosing the right foods can help ease the discomfort of GERD. Consider working with a diet and nutrition specialist (dietitian) to help you make healthy food choices. What general guidelines should I follow?  Eating plan  Choose healthy foods low in fat, such as fruits, vegetables, whole grains, low-fat dairy products, and lean meat, fish, and poultry.  Eat frequent, small meals instead of three large meals each day. Eat your meals slowly, in a relaxed setting. Avoid bending over or lying down until 2-3 hours after eating.  Limit high-fat foods such as fatty meats or fried foods.  Limit your intake of oils, butter, and shortening to less than 8 teaspoons each day.  Avoid the following: ? Foods that cause symptoms. These may be different for different people. Keep a food diary to keep track of foods that cause symptoms. ? Alcohol. ? Drinking large amounts of liquid with meals. ? Eating meals during the 2-3 hours before bed.  Cook foods using methods other than frying. This may include baking, grilling, or broiling. Lifestyle  Maintain a healthy weight. Ask your health care provider what weight is healthy for you. If you need to lose weight, work with your health care provider to do so safely.  Exercise for at least 30 minutes on 5 or more days each week, or as told by your health care provider.  Avoid wearing clothes that fit tightly around your waist and chest.  Do not use any products that contain nicotine or tobacco, such as cigarettes and e-cigarettes. If you need help quitting, ask your health care  provider.  Sleep with the head of your bed raised. Use a wedge under the mattress or blocks under the bed frame to raise the head of the bed. What foods are not recommended? The items listed may not be a complete list. Talk with your dietitian about what dietary choices are best for you. Grains Pastries or quick breads with added fat. [place] toast. Vegetables Deep fried vegetables. [place] fries. Any vegetables prepared with added fat. Any vegetables that cause symptoms. For some people this may include tomatoes and tomato products, chili peppers, onions and garlic, and horseradish. Fruits Any fruits prepared with added fat. Any fruits that cause symptoms. For some people this may include citrus fruits, such as oranges, grapefruit, pineapple, and lemons. Meats and other protein foods High-fat meats, such as fatty beef or pork, hot dogs, ribs, ham, sausage, salami and bacon. Fried meat or protein, including fried fish and fried chicken. Nuts and nut butters. Dairy Whole milk and chocolate milk. Sour cream. Cream. Ice cream. Cream cheese. Milk shakes. Beverages Coffee and tea, with or without caffeine. Carbonated beverages. Sodas. Energy drinks. Fruit juice made with acidic fruits (such as orange or grapefruit). Tomato juice. Alcoholic drinks. Fats and oils Butter. Margarine. Shortening. Ghee. Sweets and desserts Chocolate and cocoa. Donuts. Seasoning and other foods Pepper. Peppermint and spearmint. Any condiments, herbs, or seasonings that cause symptoms. For some people, this may include curry, hot sauce, or vinegar-based salad dressings. Summary  When you have gastroesophageal reflux disease (GERD), food and lifestyle choices are very important to  help ease the discomfort of GERD.  Eat frequent, small meals instead of three large meals each day. Eat your meals slowly, in a relaxed setting. Avoid bending over or lying down until 2-3 hours after eating.  Limit high-fat foods such as fatty  meat or fried foods. This information is not intended to replace advice given to you by your health care provider. Make sure you discuss any questions you have with your health care provider. Document Revised: 06/07/2018 Document Reviewed: 02/16/2016 Elsevier Patient Education  [place].

## 2019-04-29 DIAGNOSIS — H26492 Other secondary cataract, left eye: Secondary | ICD-10-CM | POA: Diagnosis not present

## 2019-04-30 ENCOUNTER — Other Ambulatory Visit: Payer: Self-pay | Admitting: Family Medicine

## 2019-04-30 DIAGNOSIS — I10 Essential (primary) hypertension: Secondary | ICD-10-CM

## 2019-05-06 DIAGNOSIS — M47812 Spondylosis without myelopathy or radiculopathy, cervical region: Secondary | ICD-10-CM | POA: Diagnosis not present

## 2019-05-07 ENCOUNTER — Telehealth: Payer: Self-pay | Admitting: Family Medicine

## 2019-05-07 ENCOUNTER — Other Ambulatory Visit: Payer: Self-pay

## 2019-05-07 DIAGNOSIS — H5202 Hypermetropia, left eye: Secondary | ICD-10-CM | POA: Diagnosis not present

## 2019-05-07 DIAGNOSIS — H524 Presbyopia: Secondary | ICD-10-CM | POA: Diagnosis not present

## 2019-05-07 DIAGNOSIS — H04123 Dry eye syndrome of bilateral lacrimal glands: Secondary | ICD-10-CM | POA: Diagnosis not present

## 2019-05-07 DIAGNOSIS — H52223 Regular astigmatism, bilateral: Secondary | ICD-10-CM | POA: Diagnosis not present

## 2019-05-07 NOTE — Telephone Encounter (Signed)
Thank you.  Please document the rx in epic

## 2019-05-07 NOTE — Telephone Encounter (Signed)
Called in 20 mg to [facility] for pt and pt was advised [facility]

## 2019-05-07 NOTE — Telephone Encounter (Signed)
My understanding from reading this message is that her symptoms resolved completely while on the prilosec OTC, but recurred upon completing the 2 week course.  Please confirm this with patient, and if this is correct, then send in a 30 day supply of omeprazole 20mg  to her pharmacy.  If she did NOT get complete response, only partially better, then I would increase the dose to 40mg . But only if she had some partial improvement only.  Rx #30, no refills, and to f/u if symptoms recur after completing another month's worth of medication.  Thanks

## 2019-05-07 NOTE — Telephone Encounter (Signed)
Spoke with pt and she did say the medication has helped her

## 2019-05-07 NOTE — Telephone Encounter (Signed)
Pt called and state that she is still having the burning in her chest and burping, states she stopped taking the omeprazole on Sunday and was informed and did not have any of these symptoms while she was taking the RX. States every time she eats she gets these symptoms and today as been the worse even with out eating she has had the burning in the chest/indesgstion/ pt wants to know what to do informed pt that you was out of the office. Please send to someone as I am leaving. [facility] uses [id number] - [place], [place] - [address]. AT [address]  Pt wasn't for sure if she needs to come back to see you or go see a GI dr.

## 2019-05-08 ENCOUNTER — Other Ambulatory Visit: Payer: Self-pay

## 2019-05-08 NOTE — Telephone Encounter (Signed)
Patient advised and she will call back if the medication does not help after 30 days.

## 2019-05-08 NOTE — Telephone Encounter (Signed)
If her symptoms completely responded to just 20mg  of omeprazole, then I'd like to postpone GI referral.  If she has recurrent symptoms upon completion of a month's worth of med, I'd be happy to refer, as she may then need to have EGD.  With symptoms short-lived and responding to PPI (OTC strength), I truly do not think they would do anything different at this point.

## 2019-05-08 NOTE — Telephone Encounter (Signed)
[name] reported this morning that when she spoke patient yesterday she asked for a referral to GI for GERD.

## 2019-05-14 DIAGNOSIS — M4302 Spondylolysis, cervical region: Secondary | ICD-10-CM | POA: Diagnosis not present

## 2019-05-14 DIAGNOSIS — G894 Chronic pain syndrome: Secondary | ICD-10-CM | POA: Diagnosis not present

## 2019-05-14 DIAGNOSIS — M4312 Spondylolisthesis, cervical region: Secondary | ICD-10-CM | POA: Diagnosis not present

## 2019-05-14 DIAGNOSIS — Z79899 Other long term (current) drug therapy: Secondary | ICD-10-CM | POA: Diagnosis not present

## 2019-05-14 DIAGNOSIS — M47812 Spondylosis without myelopathy or radiculopathy, cervical region: Secondary | ICD-10-CM | POA: Diagnosis not present

## 2019-05-20 ENCOUNTER — Other Ambulatory Visit: Payer: Self-pay | Admitting: Orthopaedic Surgery

## 2019-05-20 DIAGNOSIS — M47812 Spondylosis without myelopathy or radiculopathy, cervical region: Secondary | ICD-10-CM

## 2019-05-22 ENCOUNTER — Ambulatory Visit (INDEPENDENT_AMBULATORY_CARE_PROVIDER_SITE_OTHER): Payer: PPO | Admitting: Family Medicine

## 2019-05-22 ENCOUNTER — Telehealth: Payer: Self-pay | Admitting: Family Medicine

## 2019-05-22 ENCOUNTER — Other Ambulatory Visit: Payer: Self-pay

## 2019-05-22 ENCOUNTER — Encounter: Payer: Self-pay | Admitting: Family Medicine

## 2019-05-22 VITALS — BP 130/80 | HR 76 | Temp 98.0°F | Ht 61.5 in | Wt 129.0 lb

## 2019-05-22 DIAGNOSIS — I10 Essential (primary) hypertension: Secondary | ICD-10-CM

## 2019-05-22 DIAGNOSIS — R42 Dizziness and giddiness: Secondary | ICD-10-CM | POA: Diagnosis not present

## 2019-05-22 DIAGNOSIS — K209 Esophagitis, unspecified without bleeding: Secondary | ICD-10-CM

## 2019-05-22 DIAGNOSIS — J309 Allergic rhinitis, unspecified: Secondary | ICD-10-CM | POA: Diagnosis not present

## 2019-05-22 NOTE — Telephone Encounter (Signed)
Pt called and has a virtual visit this morning and pt state she thinks she should come in the office, I scheduled pt as a virtual because she said she had a runny nose, and she called today and states that today that is completely gone, but now today she said her throat is red and irritated but not sore she said, she said she thinks she needs to be evaluated in person and not on the phone and could come in tomorrow, I told her I would have to  Let you make that call due to the red sore throat, pt can be reached at [phone]

## 2019-05-22 NOTE — Progress Notes (Signed)
Chief Complaint  Patient presents with  . Vertigo    had a huge bout of vertigo Monday morning, Had stomach ache, Monday 5am -had pizza Sunday night. Yesterday felt like her throat was a little sore and maybe from allergies.    Two mornings ago she turned over to her right side in bed at 5am, and had sudden onset of vertigo. The night before she had pizza, and some lower abdominal pain.  Denies any heartburn, reflux.  She felt better after a bowel movement--she had gotten up to go to the bathroom when she woke up with vertigo. After using the bathroom, had recurrent vertigo when she got back into bed.  Noticed the dizziness with head position changes.  Woke up again at 7:30am (later same morning), had some mild dizziness.  It resolved. She looked up online and found Epley maneuver--she laid down with her head hanging off the bed straight back, and the room started spinning severely.  She didn't finish the maneuver due to the degree of vertigo.  She had slight vertigo yesterday, and as a precaution she took some meclizine she had at home.  Took is at 3pm.  Didn't have any problems with vertigo when she went to bed last night (only very short-lived getting into bed). No side effects to the medication.  She had just a tiny bit of spinning today when she leaned forward to pick something up--immediately picked her head back up and she was fine.  She denies any ear pain, but reports some plugging in both ears. Denies tinnitus.  Denies significant allergies currently--she has some congestion/stuffiness in the nose.  Denies runny nose.  She does have some postnasal drainage, no throat pain. She feels a slight tightness in her throat.  She noticed some redness in the whole upper part of the throat, when looked with a flashlight.   Glands feel a little swollen below her jaw bilaterally.  Husband with allergies--sneezing x 2 days.  Possible esophagitis (from potassium)--She had taken OTC omeprazole for 2  weeks, chest pain resolved, but recurred once she stopped the medication.  She has been taking the prescription medication for about 2 weeks now.  Denies any pain, heartburn or belching, except after a martini.  Has burning after a martini, so no longer drinks them. No symptoms from wine.  PMH, PSH, SH reviewed  Outpatient Encounter Medications as of 05/22/2019  Medication Sig Note  . amLODipine (NORVASC) 5 MG tablet TAKE 1 TABLET BY MOUTH EVERY DAY   . Calcium Carbonate-Vit D-Min (CALCIUM 1200 PO) Take 2,400 mg by mouth daily.  02/27/2019: Unsure of dose, takes 2 together.  . HYDROcodone-acetaminophen (NORCO/VICODIN) 5-325 MG tablet Take 0.5 tablets by mouth every 6 (six) hours as needed for moderate pain. 02/27/2019: Uses 1/2 tablet about every other day  . lamoTRIgine (LAMICTAL) 150 MG tablet Take 150 mg by mouth at bedtime.     . Multiple Vitamins-Minerals (HAIR SKIN AND NAILS FORMULA PO) Take 1 tablet by mouth daily.   Marland Kitchen omeprazole (PRILOSEC) 20 MG capsule Take 20 mg by mouth daily.   Marland Kitchen VITAMIN D PO Take 5,000 Units by mouth daily.    Marland Kitchen zolpidem (AMBIEN) 10 MG tablet Take 2.5 mg by mouth at bedtime.    . B Complex-C (B-COMPLEX WITH VITAMIN C) tablet Take 1 tablet by mouth 2 (two) times a day. 08/27/2018: Takes B-complex 100 once daily  . budesonide-formoterol (SYMBICORT) 160-4.5 MCG/ACT inhaler Inhale 2 puffs into the lungs daily as needed (shortness  of breath).  02/27/2019: Uses it only prn, if she notices a "wheezy cough"; works better than albuterol for prn use  . diclofenac Sodium (VOLTAREN) 1 % GEL Apply topically 4 (four) times daily.   . Liniments (SALONPAS PAIN RELIEF PATCH EX) Place 1 patch onto the skin daily as needed (pain).   . meclizine (ANTIVERT) 25 MG tablet Take 25 mg by mouth 3 (three) times daily as needed for dizziness. 05/22/2019: [facility] Motion Sickness-last dose yesterday  . PROAIR HFA 108 (90 Base) MCG/ACT inhaler Inhale 2 puffs into the lungs every 4 (four) hours as  needed for wheezing or shortness of breath. (Patient not taking: Reported on 05/22/2019)   . [DISCONTINUED] zinc gluconate 50 MG tablet Take 50 mg by mouth daily.    No facility-administered encounter medications on file as of 05/22/2019.   Allergies  Allergen Reactions  . Cymbalta [Duloxetine Hcl]     Abdominal pain  . Nitrofurantoin [Macrodantin]   . Sulfamethoxazole Hives  . Cefdinir Rash   ROS: No fever or chills.  Denies headaches, syncope.  Vertigo per HPI.  Ear plugging, no pain or decreased hearing.  Denies nausea and vomiting.  Bowels are regular, no diarrhea.  Abdominal pain resolved after bowel movement. Denies heartburn, reflux (except after a martini, which she stopped).  Leg cramps have improved.  Slight cramping in her RUE (forearm/hand) yesterday.   PHYSICAL EXAM:  BP 130/80   Pulse 76   Temp 98 F (36.7 C) (Other (Comment))   Ht 5' 1.5" (1.562 m)   Wt 129 lb (58.5 kg)   LMP 08/15/1988 (Exact Date)   BMI 23.98 kg/m   Pleasant, well-appearing female in no distress HEENT: conjunctiva and sclera are clear, EOMI. Nasal mucosa--Mild mucosal edema L>R TM's and EAC's normal. OP clear. Sinuses nontender Neck:  no lymphadenopathy or thyromegaly.  She was slightly tender below angles of jaw bilaterally, but no enlarged lymph nodes were palpable. Heart: regular rate and rhythm Lungs: clear bilaterally Abdomen: soft, nontender, no mass. No epigastric tenderness Neuro: alert and oriented. Cranial nerves intact. Normal gait, strength, sensation.  She had slight vertigo with head turned to R when laying down. Same when turned to the left (both laying and sitting up), and seemed to last just a little longer than on the R. She had more severe vertigo (and lasted longer) when she went from laying on her back to turning to her right side. No significant vertigo when facing forward, reclining and sitting up. Extremities: no edema, normal pulses Skin: normal turgor Psych: normal  mood, affect, hygiene and grooming.  Slightly anxious   ASSESSMENT/PLAN:  Vertigo - seems to have positional component.  Try and avoid positions which worsen (keep head straight). Meclizine prn.  To PT for treatment if persistent/worsening.   Esophagitis - suspected (due to potassium supplement); Symptoms have resolved on omeprazole.  Complete the course.  If sx recur upon stopping, can refer to GI  Essential hypertension - well controlled on current regimen  Allergic rhinitis, unspecified seasonality, unspecified trigger - mild on exam, may contribute some to vertigo, and to her congestion and throat complaints. OTC antihistamine prn    Please try and drink plenty of water throughout the day (to help minimize cramping and many other problems).  Use claritin or allegra or zyrtec to treat your allergies. I don't think tylenol sinus would be helpful. Try and lay on your back (avoid laying on your sides, as this seems to trigger your vertigo). Move slowly  when getting up, trying to keep your head facing forward, moving your whole body rather than just your head. I suspect you have positional vertigo, as we discussed, which may take some time to resolve on its own.  If it doesn't resolve on its own, then I recommend we refer your to physical therapy for the maneuver to resolve it--do not try it at home by yourself.  You may use meclizine (which I believe is what you have at home) as needed for more severe vertigo.  Dose is 12.5 to 25mg  at a time.  Don't take more than 25mg .  It can be sedating for some people, so use with caution if you need to drive.  Don't mix with alcohol.

## 2019-05-22 NOTE — Patient Instructions (Signed)
Please try and drink plenty of water throughout the day (to help minimize cramping and many other problems).  Use claritin or allegra or zyrtec to treat your allergies. I don't think tylenol sinus would be helpful. Try and lay on your back (avoid laying on your sides, as this seems to trigger your vertigo). Move slowly when getting up, trying to keep your head facing forward, moving your whole body rather than just your head. I suspect you have positional vertigo, as we discussed, which may take some time to resolve on its own.  If it doesn't resolve on its own, then I recommend we refer your to physical therapy for the maneuver to resolve it--do not try it at home by yourself.  You may use meclizine (which I believe is what you have at home) as needed for more severe vertigo.  Dose is 12.5 to 25mg  at a time.  Don't take more than 25mg .  It can be sedating for some people, so use with caution if you need to drive.  Don't mix with alcohol.    Benign Positional Vertigo Vertigo is the feeling that you or your surroundings are moving when they are not. Benign positional vertigo is the most common form of vertigo. This is usually a harmless condition (benign). This condition is positional. This means that symptoms are triggered by certain movements and positions. This condition can be dangerous if it occurs while you are doing something that could cause harm to you or others. This includes activities such as driving or operating machinery. What are the causes? In many cases, the cause of this condition is not known. It may be caused by a disturbance in an area of the inner ear that helps your brain to sense movement and balance. This disturbance can be caused by:  Viral infection (labyrinthitis).  Head injury.  Repetitive motion, such as jumping, dancing, or running. What increases the risk? You are more likely to develop this condition if:  You are a woman.  You are 30 years of age or  older. What are the signs or symptoms? Symptoms of this condition usually happen when you move your head or your eyes in different directions. Symptoms may start suddenly, and usually last for less than a minute. They include:  Loss of balance and falling.  Feeling like you are spinning or moving.  Feeling like your surroundings are spinning or moving.  Nausea and vomiting.  Blurred vision.  Dizziness.  Involuntary eye movement (nystagmus). Symptoms can be mild and cause only minor problems, or they can be severe and interfere with daily life. Episodes of benign positional vertigo may return (recur) over time. Symptoms may improve over time. How is this diagnosed? This condition may be diagnosed based on:  Your medical history.  Physical exam of the head, neck, and ears.  Tests, such as: ? MRI. ? CT scan. ? Eye movement tests. Your health care provider may ask you to change positions quickly while he or she watches you for symptoms of benign positional vertigo, such as nystagmus. Eye movement may be tested with a variety of exams that are designed to evaluate or stimulate vertigo. ? An electroencephalogram (EEG). This records electrical activity in your brain. ? Hearing tests. You may be referred to a health care provider who specializes in ear, nose, and throat (ENT) problems (otolaryngologist) or a provider who specializes in disorders of the nervous system (neurologist). How is this treated?  This condition may be treated in a session  in which your health care provider moves your head in specific positions to adjust your inner ear back to normal. Treatment for this condition may take several sessions. Surgery may be needed in severe cases, but this is rare. In some cases, benign positional vertigo may resolve on its own in 2-4 weeks. Follow these instructions at home: Safety  Move slowly. Avoid sudden body or head movements or certain positions, as told by your health care  provider.  Avoid driving until your health care provider says it is safe for you to do so.  Avoid operating heavy machinery until your health care provider says it is safe for you to do so.  Avoid doing any tasks that would be dangerous to you or others if vertigo occurs.  If you have trouble walking or keeping your balance, try using a cane for stability. If you feel dizzy or unstable, sit down right away.  Return to your normal activities as told by your health care provider. Ask your health care provider what activities are safe for you. General instructions  Take over-the-counter and prescription medicines only as told by your health care provider.  Drink enough fluid to keep your urine pale yellow.  Keep all follow-up visits as told by your health care provider. This is important. Contact a health care provider if:  You have a fever.  Your condition gets worse or you develop new symptoms.  Your family or friends notice any behavioral changes.  You have nausea or vomiting that gets worse.  You have numbness or a "pins and needles" sensation. Get help right away if you:  Have difficulty speaking or moving.  Are always dizzy.  Faint.  Develop severe headaches.  Have weakness in your legs or arms.  Have changes in your hearing or vision.  Develop a stiff neck.  Develop sensitivity to light. Summary  Vertigo is the feeling that you or your surroundings are moving when they are not. Benign positional vertigo is the most common form of vertigo.  The cause of this condition is not known. It may be caused by a disturbance in an area of the inner ear that helps your brain to sense movement and balance.  Symptoms include loss of balance and falling, feeling that you or your surroundings are moving, nausea and vomiting, and blurred vision.  This condition can be diagnosed based on symptoms, physical exam, and other tests, such as MRI, CT scan, eye movement tests, and  hearing tests.  Follow safety instructions as told by your health care provider. You will also be told when to contact your health care provider in case of problems. This information is not intended to replace advice given to you by your health care provider. Make sure you discuss any questions you have with your health care provider. Document Revised: 07/26/2017 Document Reviewed: 07/26/2017 Elsevier Patient Education  [place].  Vertigo Vertigo is the feeling that you or the things around you are moving when they are not. This feeling can come and go at any time. Vertigo often goes away on its own. This condition can be dangerous if it happens when you are doing activities like driving or working with machines. Your doctor will do tests to find the cause of your vertigo. These tests will also help your doctor decide on the best treatment for you. Follow these instructions at home: Eating and drinking      Drink enough fluid to keep your pee (urine) pale yellow.  Do  not drink alcohol. Activity  Return to your normal activities as told by your doctor. Ask your doctor what activities are safe for you.  In the morning, first sit up on the side of the bed. When you feel okay, stand slowly while you hold onto something until you know that your balance is fine.  Move slowly. Avoid sudden body or head movements or certain positions, as told by your doctor.  Use a cane if you have trouble standing or walking.  Sit down right away if you feel dizzy.  Avoid doing any tasks or activities that can cause danger to you or others if you get dizzy.  Avoid bending down if you feel dizzy. Place items in your home so that they are easy for you to reach without leaning over.  Do not drive or use heavy machinery if you feel dizzy. General instructions  Take over-the-counter and prescription medicines only as told by your doctor.  Keep all follow-up visits as told by your doctor. This is  important. Contact a doctor if:  Your medicine does not help your vertigo.  You have a fever.  Your problems get worse or you have new symptoms.  Your family or friends see changes in your behavior.  The feeling of being sick to your stomach gets worse.  Your vomiting gets worse.  You lose feeling (have numbness) in part of your body.  You feel prickling and tingling in a part of your body. Get help right away if:  You have trouble moving or talking.  You are always dizzy.  You pass out (faint).  You get very bad headaches.  You feel weak in your hands, arms, or legs.  You have changes in your hearing.  You have changes in how you see (vision).  You get a stiff neck.  Bright light starts to bother you. Summary  Vertigo is the feeling that you or the things around you are moving when they are not.  Your doctor will do tests to find the cause of your vertigo.  You may be told to avoid some tasks, positions, or movements.  Contact a doctor if your medicine is not helping, or if you have a fever, new symptoms, or a change in behavior.  Get help right away if you get very bad headaches, or if you have changes in how you speak, hear, or see. This information is not intended to replace advice given to you by your health care provider. Make sure you discuss any questions you have with your health care provider. Document Revised: 01/08/2018 Document Reviewed: 01/08/2018 Elsevier Patient Education  2020 [employer].

## 2019-05-22 NOTE — Telephone Encounter (Signed)
Ok for visit

## 2019-06-01 ENCOUNTER — Other Ambulatory Visit: Payer: Self-pay | Admitting: Family Medicine

## 2019-06-04 ENCOUNTER — Other Ambulatory Visit: Payer: Self-pay

## 2019-06-04 ENCOUNTER — Ambulatory Visit
Admission: RE | Admit: 2019-06-04 | Discharge: 2019-06-04 | Disposition: A | Discharge status: Home / Self Care | Payer: PPO | Source: Ambulatory Visit | Attending: Orthopaedic Surgery | Admitting: Orthopaedic Surgery

## 2019-06-04 DIAGNOSIS — M4322 Fusion of spine, cervical region: Secondary | ICD-10-CM | POA: Diagnosis not present

## 2019-06-04 DIAGNOSIS — M47812 Spondylosis without myelopathy or radiculopathy, cervical region: Secondary | ICD-10-CM

## 2019-06-10 ENCOUNTER — Telehealth: Payer: Self-pay | Admitting: *Deleted

## 2019-06-10 DIAGNOSIS — K209 Esophagitis, unspecified without bleeding: Secondary | ICD-10-CM

## 2019-06-10 NOTE — Telephone Encounter (Signed)
Patient called and said omeprazole really is not helping and her discomfort is becoming very painful. She is asking for the name of a GI doctor. (I am assuming she is looking for a referral).

## 2019-06-10 NOTE — Telephone Encounter (Signed)
Patient advised.

## 2019-06-10 NOTE — Telephone Encounter (Signed)
Advise pt that referral was put in for [facility]

## 2019-06-11 ENCOUNTER — Encounter: Payer: Self-pay | Admitting: Nurse Practitioner

## 2019-06-25 ENCOUNTER — Ambulatory Visit: Payer: PPO | Admitting: Nurse Practitioner

## 2019-06-25 ENCOUNTER — Telehealth: Payer: Self-pay | Admitting: Nurse Practitioner

## 2019-06-25 ENCOUNTER — Encounter: Payer: Self-pay | Admitting: Nurse Practitioner

## 2019-06-25 VITALS — BP 124/80 | HR 80 | Temp 98.9°F | Ht 61.0 in | Wt 130.1 lb

## 2019-06-25 DIAGNOSIS — K219 Gastro-esophageal reflux disease without esophagitis: Secondary | ICD-10-CM | POA: Diagnosis not present

## 2019-06-25 DIAGNOSIS — Z86010 Personal history of colonic polyps: Secondary | ICD-10-CM

## 2019-06-25 MED ORDER — FAMOTIDINE 20 MG PO TABS: 20.0000 mg | ORAL_TABLET | Freq: Every day | ORAL | 0 refills | Status: DC

## 2019-06-25 MED ORDER — NA SULFATE-K SULFATE-MG SULF 17.5-3.13-1.6 GM/177ML PO SOLN: 1.0000 | Freq: Once | ORAL | 0 refills | Status: AC

## 2019-06-25 NOTE — Progress Notes (Signed)
06/25/2019 Nelson Schooler [id number] [birth year]   CHIEF COMPLAINT: Heartburn  HISTORY OF PRESENT ILLNESS:  [name] is a 74 year old female with a past medical history of anxiety, depression, asthma and chronic neck pain.  She presents to our office today as referred by Dr. Tomi Bamberger for further evaluation regarding increased heartburn symptoms since the end of February 2021.  She developed leg cramps in February and she elected to take over-the-counter potassium tablets at nighttime.  Her leg cramps somewhat improved.  However, she developed nighttime heartburn which she attributed to taking the potassium tablets.  She was seen by her PCP who prescribed omeprazole 20 mg daily for 2 weeks.  She continued to have heartburn, mostly at night therefore omeprazole was increased to 40 mg once daily.  She is taken the omeprazole 40 mg daily for about 5 weeks.  She has noticed dizziness since starting the omeprazole.  She stopped the omeprazole for 2 days but her heartburn recurred so she resumed taking the omeprazole.  She denies having any dysphagia or stomach pain.  No lower abdominal pain.  He was drinking 1 vodka cocktail nightly which she stopped as this increase her heartburn.  She is passing a normal formed brown bowel movement daily.  No rectal bleeding or hematochezia.  She infrequently takes Advil 200 mg 2 tabs as needed for aches and pains.  She last took Advil yesterday.  She denies having any current dizziness at this time.  No chest pain or palpitations.  No shortness of breath.  He has a history of colon polyps.  Her most recent colonoscopy was 09/16/2014 which identified a tubular adenomatous and hyperplastic polyp which were removed.  She was advised by Dr. Hilarie Fredrickson to repeat a colonoscopy July 2021.  No known family history of esophageal, stomach or colon cancer.  She has gained a few lbs in the past month. No other complaints today.  Colonoscopy 09/16/2014 by Dr. Hilarie Fredrickson:  1. Pedunculated polyp was  found in the rectosigmoid colon; polypectomy was performed using snare cautery 2. Sessile polyp was found in the rectosigmoid colon; polypectomy was performed with a cold snare 3. Mild diverticulosis was noted in the left colon - 5 year recall  Surgical [P], recto-sigmoid, polyp (2) - TUBULAR ADENOMA (X1); NEGATIVE FOR HIGH GRADE DYSPLASIA OR MALIGNANCY. - HYPERPLASTIC POLYP (X1)   Past Medical History:  Diagnosis Date  . Anxiety    sees [facility], [place])  . Asthma   . Colon polyps 08/2014   tubular adenoma and hyperplastic polyps (Dr. Hilarie Fredrickson)  . DDD (degenerative disc disease), cervical dr Patrice Paradise   s/p surgery, ongoing pain  . Depression    h/o hospitalization with ECT  . History of insomnia   . Hypertension   . Neck pain   . Osteopenia stopped fosamax ~2006  . Post-operative nausea and vomiting   . Vertigo   . Vitamin D deficiency borderline(28)-2010   treated by her GYN   Past Surgical History:  Procedure Laterality Date  . CATARACT EXTRACTION, BILATERAL Bilateral Sept/October 2019  . HEMORRHOID SURGERY  2006, 12/2014  . KNEE ARTHROSCOPY Right   . LASIK     Dr. Lucita Ferrara  . NASAL SEPTOPLASTY W/ TURBINOPLASTY  1986  . NECK SURGERY  02/2010([facility])1/03,2005([facility])  . REDUCTION MAMMAPLASTY Bilateral 07/24/2008  . TONSILLECTOMY    . TOTAL HIP ARTHROPLASTY Left 08/2007      . 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 She smoked x 1 year in college. She drinks one vodka cocktail nightly.  No history drug use.   Family History: Mother age 36 after falling, history of pernicious anemia.  Father died age age 49 MI. Brother died from complicated depression.    Allergies  Allergen Reactions  . Cymbalta [Duloxetine Hcl]     Abdominal pain    . Nitrofurantoin [Macrodantin]   . Sulfamethoxazole Hives  . Cefdinir Rash     Outpatient Encounter Medications as of 06/25/2019  Medication Sig  . amLODipine (NORVASC) 5 MG tablet TAKE 1 TABLET BY MOUTH EVERY DAY  . B Complex-C (B-COMPLEX WITH VITAMIN C) tablet Take 1 tablet by mouth 2 (two) times a day.  . Calcium Carbonate-Vit D-Min (CALCIUM 1200 PO) Take 2,400 mg by mouth daily.   Marland Kitchen HYDROcodone-acetaminophen (NORCO/VICODIN) 5-325 MG tablet Take 0.5 tablets by mouth every 6 (six) hours as needed for moderate pain.  Marland Kitchen lamoTRIgine (LAMICTAL) 150 MG tablet Take 150 mg by mouth at bedtime.    . Liniments (SALONPAS PAIN RELIEF PATCH EX) Place 1 patch onto the skin daily as needed (pain).  . Multiple Vitamins-Minerals (HAIR SKIN AND NAILS FORMULA PO) Take 1 tablet by mouth daily.  Marland Kitchen omeprazole (PRILOSEC) 20 MG capsule TAKE 1 CAPSULE BY MOUTH EVERY DAY  . VITAMIN D PO Take 5,000 Units by mouth daily.   Marland Kitchen zolpidem (AMBIEN) 10 MG tablet Take 2.5 mg by mouth at bedtime.   . budesonide-formoterol (SYMBICORT) 160-4.5 MCG/ACT inhaler Inhale 2 puffs into the lungs daily as needed (shortness of breath).   Marland Kitchen PROAIR HFA 108 (90 Base) MCG/ACT inhaler Inhale 2 puffs into the lungs every 4 (four) hours as needed for wheezing or shortness of breath. (Patient not taking: Reported on 05/22/2019)  . [DISCONTINUED] diclofenac Sodium (VOLTAREN) 1 % GEL Apply topically 4 (four) times daily.  . [DISCONTINUED] meclizine (ANTIVERT) 25 MG tablet Take 25 mg by mouth 3 (three) times daily as needed for dizziness.   No facility-administered encounter medications on file as of 06/25/2019.     REVIEW OF SYSTEMS: All other systems reviewed and negative except where noted in the History of Present Illness.   PHYSICAL EXAM: BP 124/80 (BP Location: Left Arm, Patient Position: Sitting, Cuff Size: Normal)   Pulse 80   Temp 98.9 F (37.2 C)   Ht 5\' 1"  (1.549 m) Comment: height measured without shoes  Wt 130 lb 2 oz (59  kg)   LMP 08/15/1988 (Exact Date)   BMI 24.59 kg/m  General: Well developed 74 year old female in no acute distress. Head: Normocephalic and atraumatic. Eyes:  Sclerae non-icteric, conjunctive pink. Ears: Normal auditory acuity. Mouth: Dentition intact. No ulcers or lesions.  Neck: Supple, no lymphadenopathy or thyromegaly.  Lungs: Clear bilaterally to auscultation without wheezes, crackles or rhonchi. Heart: Regular rate and rhythm. No murmur, rub or gallop appreciated.  Abdomen: Soft, nontender, non distended. No masses. No hepatosplenomegaly. Normoactive bowel sounds x 4 quadrants.  Rectal: Deferred. Musculoskeletal: Symmetrical with no gross deformities. Skin: Warm and dry. No rash or lesions on visible extremities. Extremities: No edema. Neurological: Alert oriented x 4, no focal deficits.  Psychological:  Alert and cooperative. Normal mood and affect.  ASSESSMENT AND PLAN:  43.  74 year old female with heartburn  triggered by taking potassium supplement at nighttime.  Heartburn  symptoms reduced on PPI but she developed dizziness since taking omeprazole. -Stop omeprazole -Start famotidine 20 mg once daily, will increase dose if tolerated and if needed -EGD benefits and risks discussed including risk with sedation, risk of bleeding, perforation and infection  -GERD handout -CBC and CMP -Patient to follow-up with her PCP if dizziness persists  2.  History of colon polyp -Colonoscopy benefits and risks discussed including risk with sedation, risk of bleeding, perforation and infection   Further follow-up to be determined after the above evaluation completed   CC:  [name], MD

## 2019-06-25 NOTE — Patient Instructions (Signed)
If you are age 76 or older, your body mass index should be between 23-30. Your Body mass index is 24.59 kg/m. If this is out of the aforementioned range listed, please consider follow up with your Primary Care Provider.  If you are age 57 or younger, your body mass index should be between 19-25. Your Body mass index is 24.59 kg/m. If this is out of the aformentioned range listed, please consider follow up with your Primary Care Provider.   Stop omeprazole We have sent the following medications to your pharmacy for you to pick up at your convenience:  Famotidine 20 mg 1 tablet daily Suprep   Due to recent changes in healthcare laws, you may see the results of your imaging and laboratory studies on MyChart before your provider has had a chance to review them.  We understand that in some cases there may be results that are confusing or concerning to you. Not all laboratory results come back in the same time frame and the provider may be waiting for multiple results in order to interpret others.  Please give [place] 48 hours in order for your provider to thoroughly review all the results before contacting the office for clarification of your results.    Gastroesophageal Reflux Disease, Adult Gastroesophageal reflux (GER) happens when acid from the stomach flows up into the tube that connects the mouth and the stomach (esophagus). Normally, food travels down the esophagus and stays in the stomach to be digested. With GER, food and stomach acid sometimes move back up into the esophagus. You may have a disease called gastroesophageal reflux disease (GERD) if the reflux:  Happens often.  Causes frequent or very bad symptoms.  Causes problems such as damage to the esophagus. When this happens, the esophagus becomes sore and swollen (inflamed). Over time, GERD can make small holes (ulcers) in the lining of the esophagus. What are the causes? This condition is caused by a problem with the muscle between the  esophagus and the stomach. When this muscle is weak or not normal, it does not close properly to keep food and acid from coming back up from the stomach. The muscle can be weak because of:  Tobacco use.  Pregnancy.  Having a certain type of hernia (hiatal hernia).  Alcohol use.  Certain foods and drinks, such as coffee, chocolate, onions, and peppermint. What increases the risk? You are more likely to develop this condition if you:  Are overweight.  Have a disease that affects your connective tissue.  Use NSAID medicines. What are the signs or symptoms? Symptoms of this condition include:  Heartburn.  Difficult or painful swallowing.  The feeling of having a lump in the throat.  A bitter taste in the mouth.  Bad breath.  Having a lot of saliva.  Having an upset or bloated stomach.  Belching.  Chest pain. Different conditions can cause chest pain. Make sure you see your doctor if you have chest pain.  Shortness of breath or noisy breathing (wheezing).  Ongoing (chronic) cough or a cough at night.  Wearing away of the surface of teeth (tooth enamel).  Weight loss. How is this treated? Treatment will depend on how bad your symptoms are. Your doctor may suggest:  Changes to your diet.  Medicine.  Surgery. Follow these instructions at home: Eating and drinking   Follow a diet as told by your doctor. You may need to avoid foods and drinks such as: ? Coffee and tea (with or without caffeine). ?  Drinks that contain alcohol. ? Energy drinks and sports drinks. ? Bubbly (carbonated) drinks or sodas. ? Chocolate and cocoa. ? Peppermint and mint flavorings. ? Garlic and onions. ? Horseradish. ? Spicy and acidic foods. These include peppers, chili powder, curry powder, vinegar, hot sauces, and BBQ sauce. ? Citrus fruit juices and citrus fruits, such as oranges, lemons, and limes. ? Tomato-based foods. These include red sauce, chili, salsa, and pizza with red  sauce. ? Fried and fatty foods. These include donuts, french fries, potato chips, and high-fat dressings. ? High-fat meats. These include hot dogs, rib eye steak, sausage, ham, and bacon. ? High-fat dairy items, such as whole milk, butter, and cream cheese.  Eat small meals often. Avoid eating large meals.  Avoid drinking large amounts of liquid with your meals.  Avoid eating meals during the 2-3 hours before bedtime.  Avoid lying down right after you eat.  Do not exercise right after you eat. Lifestyle   Do not use any products that contain nicotine or tobacco. These include cigarettes, e-cigarettes, and chewing tobacco. If you need help quitting, ask your doctor.  Try to lower your stress. If you need help doing this, ask your doctor.  If you are overweight, lose an amount of weight that is healthy for you. Ask your doctor about a safe weight loss goal. General instructions  Pay attention to any changes in your symptoms.  Take over-the-counter and prescription medicines only as told by your doctor. Do not take aspirin, ibuprofen, or other NSAIDs unless your doctor says it is okay.  Wear loose clothes. Do not wear anything tight around your waist.  Raise (elevate) the head of your bed about 6 inches (15 cm).  Avoid bending over if this makes your symptoms worse.  Keep all follow-up visits as told by your doctor. This is important. Contact a doctor if:  You have new symptoms.  You lose weight and you do not know why.  You have trouble swallowing or it hurts to swallow.  You have wheezing or a cough that keeps happening.  Your symptoms do not get better with treatment.  You have a hoarse voice. Get help right away if:  You have pain in your arms, neck, jaw, teeth, or back.  You feel sweaty, dizzy, or light-headed.  You have chest pain or shortness of breath.  You throw up (vomit) and your throw-up looks like blood or coffee grounds.  You pass out  (faint).  Your poop (stool) is bloody or black.  You cannot swallow, drink, or eat. Summary  If a person has gastroesophageal reflux disease (GERD), food and stomach acid move back up into the esophagus and cause symptoms or problems such as damage to the esophagus.  Treatment will depend on how bad your symptoms are.  Follow a diet as told by your doctor.  Take all medicines only as told by your doctor. This information is not intended to replace advice given to you by your health care provider. Make sure you discuss any questions you have with your health care provider. Document Revised: 08/23/2017 Document Reviewed: 08/23/2017 Elsevier Patient Education  2020 [place] for Gastroesophageal Reflux Disease, Adult When you have gastroesophageal reflux disease (GERD), the foods you eat and your eating habits are very important. Choosing the right foods can help ease your discomfort. Think about working with a nutrition specialist (dietitian) to help you make good choices. What are tips for following this plan?  Meals  Choose  healthy foods that are low in fat, such as fruits, vegetables, whole grains, low-fat dairy products, and lean meat, fish, and poultry.  Eat small meals often instead of 3 large meals a day. Eat your meals slowly, and in a place where you are relaxed. Avoid bending over or lying down until 2-3 hours after eating.  Avoid eating meals 2-3 hours before bed.  Avoid drinking a lot of liquid with meals.  Cook foods using methods other than frying. Bake, grill, or broil food instead.  Avoid or limit: ? Chocolate. ? Peppermint or spearmint. ? Alcohol. ? Pepper. ? Black and decaffeinated coffee. ? Black and decaffeinated tea. ? Bubbly (carbonated) soft drinks. ? Caffeinated energy drinks and soft drinks.  Limit high-fat foods such as: ? Fatty meat or fried foods. ? Whole milk, cream, butter, or ice cream. ? Nuts and nut butters. ? Pastries,  donuts, and sweets made with butter or shortening.  Avoid foods that cause symptoms. These foods may be different for everyone. Common foods that cause symptoms include: ? Tomatoes. ? Oranges, lemons, and limes. ? Peppers. ? Spicy food. ? Onions and garlic. ? Vinegar. Lifestyle  Maintain a healthy weight. Ask your doctor what weight is healthy for you. If you need to lose weight, work with your doctor to do so safely.  Exercise for at least 30 minutes for 5 or more days each week, or as told by your doctor.  Wear loose-fitting clothes.  Do not smoke. If you need help quitting, ask your doctor.  Sleep with the head of your bed higher than your feet. Use a wedge under the mattress or blocks under the bed frame to raise the head of the bed. Summary  When you have gastroesophageal reflux disease (GERD), food and lifestyle choices are very important in easing your symptoms.  Eat small meals often instead of 3 large meals a day. Eat your meals slowly, and in a place where you are relaxed.  Limit high-fat foods such as fatty meat or fried foods.  Avoid bending over or lying down until 2-3 hours after eating.  Avoid peppermint and spearmint, caffeine, alcohol, and chocolate. This information is not intended to replace advice given to you by your health care provider. Make sure you discuss any questions you have with your health care provider. Document Revised: 06/07/2018 Document Reviewed: 03/22/2016 Elsevier Patient Education  [place].

## 2019-06-25 NOTE — Telephone Encounter (Signed)
When order was placed for famotidine it was placed for 1 not 30- sent rx in for 29 additional to make 30 total.

## 2019-06-25 NOTE — Progress Notes (Signed)
Addendum: Reviewed and agree with assessment and management plan. Liora Myles M, MD  

## 2019-06-28 ENCOUNTER — Other Ambulatory Visit: Payer: Self-pay | Admitting: Family Medicine

## 2019-07-04 DIAGNOSIS — M47812 Spondylosis without myelopathy or radiculopathy, cervical region: Secondary | ICD-10-CM | POA: Diagnosis not present

## 2019-07-15 ENCOUNTER — Encounter: Payer: PPO | Admitting: Internal Medicine

## 2019-07-15 DIAGNOSIS — M47812 Spondylosis without myelopathy or radiculopathy, cervical region: Secondary | ICD-10-CM | POA: Diagnosis not present

## 2019-07-17 ENCOUNTER — Ambulatory Visit (AMBULATORY_SURGERY_CENTER): Payer: PPO | Admitting: Internal Medicine

## 2019-07-17 ENCOUNTER — Other Ambulatory Visit: Payer: Self-pay

## 2019-07-17 ENCOUNTER — Other Ambulatory Visit: Payer: Self-pay | Admitting: Nurse Practitioner

## 2019-07-17 ENCOUNTER — Encounter: Payer: Self-pay | Admitting: Internal Medicine

## 2019-07-17 VITALS — BP 127/76 | HR 65 | Temp 96.8°F | Resp 15 | Ht 61.0 in | Wt 130.0 lb

## 2019-07-17 DIAGNOSIS — Z86010 Personal history of colonic polyps: Secondary | ICD-10-CM | POA: Diagnosis not present

## 2019-07-17 DIAGNOSIS — K449 Diaphragmatic hernia without obstruction or gangrene: Secondary | ICD-10-CM

## 2019-07-17 DIAGNOSIS — K209 Esophagitis, unspecified without bleeding: Secondary | ICD-10-CM

## 2019-07-17 DIAGNOSIS — K2100 Gastro-esophageal reflux disease with esophagitis, without bleeding: Secondary | ICD-10-CM | POA: Diagnosis not present

## 2019-07-17 DIAGNOSIS — K219 Gastro-esophageal reflux disease without esophagitis: Secondary | ICD-10-CM | POA: Diagnosis not present

## 2019-07-17 DIAGNOSIS — K228 Other specified diseases of esophagus: Secondary | ICD-10-CM | POA: Diagnosis not present

## 2019-07-17 MED ORDER — FAMOTIDINE 20 MG PO TABS: 40.0000 mg | ORAL_TABLET | Freq: Two times a day (BID) | ORAL | 0 refills | Status: DC

## 2019-07-17 MED ORDER — SODIUM CHLORIDE 0.9 % IV SOLN: 500.0000 mL | Freq: Once | INTRAVENOUS | Status: DC

## 2019-07-17 NOTE — Op Note (Signed)
[facility] Patient Name: [name] Procedure Date: 07/17/2019 2:16 PM MRN: [id number] Endoscopist: Jerene Bears , MD Age: 74 Referring MD:  Date of Birth: 03-Oct-1945 Gender: Female Account #: [id number] Procedure:                Upper GI endoscopy Indications:              Heartburn, Gastro-esophageal reflux disease Medicines:                Monitored Anesthesia Care Procedure:                Pre-Anesthesia Assessment:                           - Prior to the procedure, a History and Physical                            was performed, and patient medications and                            allergies were reviewed. The patient's tolerance of                            previous anesthesia was also reviewed. The risks                            and benefits of the procedure and the sedation                            options and risks were discussed with the patient.                            All questions were answered, and informed consent                            was obtained. Prior Anticoagulants: The patient has                            taken no previous anticoagulant or antiplatelet                            agents. ASA Grade Assessment: II - A patient with                            mild systemic disease. After reviewing the risks                            and benefits, the patient was deemed in                            satisfactory condition to undergo the procedure.                           After obtaining informed consent, the endoscope was  passed under direct vision. Throughout the                            procedure, the patient's blood pressure, pulse, and                            oxygen saturations were monitored continuously. The                            Endoscope was introduced through the mouth, and                            advanced to the second part of duodenum. The upper                            GI endoscopy was  accomplished without difficulty.                            The patient tolerated the procedure well. Scope In: Scope Out: Findings:                 LA Grade C (one or more mucosal breaks continuous                            between tops of 2 or more mucosal folds, less than                            75% circumference) esophagitis was found 32 to 33                            cm from the incisors. Biopsies were taken with a                            cold forceps for histology.                           A 4 cm hiatal hernia was found. The proximal extent                            of the gastric folds (end of tubular esophagus) was                            32 cm from the incisors. The hiatal narrowing was                            36 cm from the incisors.                           The gastroesophageal flap valve was visualized                            endoscopically and classified as Hill Grade IV (no  fold, wide open lumen, hiatal hernia present).                           The entire examined stomach was normal.                           The examined duodenum was normal. Complications:            No immediate complications. Estimated Blood Loss:     Estimated blood loss was minimal. Impression:               - LA Grade C reflux esophagitis. Biopsied.                           - 4 cm hiatal hernia.                           - Gastroesophageal flap valve classified as Hill                            Grade IV (no fold, wide open lumen, hiatal hernia                            present).                           - Normal stomach.                           - Normal examined duodenum. Recommendation:           - Patient has a contact number available for                            emergencies. The signs and symptoms of potential                            delayed complications were discussed with the                            patient. Return to normal  activities tomorrow.                            Written discharge instructions were provided to the                            patient.                           - Resume previous diet.                           - Continue present medications.                           - Increase famotidine to 40 mg twice daily.                           -  Await pathology results.                           - Office follow-up in 8-12 weeks. Jerene Bears, MD 07/17/2019 2:59:38 PM This report has been signed electronically.

## 2019-07-17 NOTE — Op Note (Signed)
[place] Patient Name: [name] Procedure Date: 07/17/2019 2:15 PM MRN: [id number] Endoscopist: Jerene Bears , MD Age: 74 Referring MD:  Date of Birth: 11-20-1945 Gender: Female Account #: [id number] Procedure:                Colonoscopy Indications:              High risk colon cancer surveillance: Personal                            history of adenoma less than 10 mm in size, Last                            colonoscopy: July 2016 Medicines:                Monitored Anesthesia Care Procedure:                Pre-Anesthesia Assessment:                           - Prior to the procedure, a History and Physical                            was performed, and patient medications and                            allergies were reviewed. The patient's tolerance of                            previous anesthesia was also reviewed. The risks                            and benefits of the procedure and the sedation                            options and risks were discussed with the patient.                            All questions were answered, and informed consent                            was obtained. Prior Anticoagulants: The patient has                            taken no previous anticoagulant or antiplatelet                            agents. ASA Grade Assessment: II - A patient with                            mild systemic disease. After reviewing the risks                            and benefits, the patient was deemed in  satisfactory condition to undergo the procedure.                           After obtaining informed consent, the colonoscope                            was passed under direct vision. Throughout the                            procedure, the patient's blood pressure, pulse, and                            oxygen saturations were monitored continuously. The                            Colonoscope was introduced through the anus and                             advanced to the cecum, identified by appendiceal                            orifice and ileocecal valve. The colonoscopy was                            performed without difficulty. The patient tolerated                            the procedure well. The quality of the bowel                            preparation was good. The ileocecal valve,                            appendiceal orifice, and rectum were photographed. Scope In: 2:40:57 PM Scope Out: 2:50:00 PM Scope Withdrawal Time: 0 hours 7 minutes 28 seconds  Total Procedure Duration: 0 hours 9 minutes 3 seconds  Findings:                 The digital rectal exam was normal.                           Multiple small and large-mouthed diverticula were                            found in the sigmoid colon and descending colon.                           Internal hemorrhoids were found during                            retroflexion. The hemorrhoids were small.                           The exam was otherwise without abnormality. Complications:  No immediate complications. Estimated Blood Loss:     Estimated blood loss: none. Impression:               - Diverticulosis in the sigmoid colon and in the                            descending colon.                           - Small internal hemorrhoids.                           - The examination was otherwise normal.                           - No specimens collected. Recommendation:           - Patient has a contact number available for                            emergencies. The signs and symptoms of potential                            delayed complications were discussed with the                            patient. Return to normal activities tomorrow.                            Written discharge instructions were provided to the                            patient.                           - Resume previous diet.                           - Continue  present medications.                           - No repeat colonoscopy due to age at next                            screening interval (>73 years old) and the absence                            of colonic polyps on today's examination. Jerene Bears, MD 07/17/2019 3:01:46 PM This report has been signed electronically.

## 2019-07-17 NOTE — Patient Instructions (Signed)
YOU HAD AN ENDOSCOPIC PROCEDURE TODAY AT [facility]:   Refer to the procedure report that was given to you for any specific questions about what was found during the examination.  If the procedure report does not answer your questions, please call your gastroenterologist to clarify.  If you requested that your care partner not be given the details of your procedure findings, then the procedure report has been included in a sealed envelope for you to review at your convenience later.  **Handouts given on esophagitis, Hiatal Hernia, Hemorrhoids and Diverticulosis**  YOU SHOULD EXPECT: Some feelings of bloating in the abdomen. Passage of more gas than usual.  Walking can help get rid of the air that was put into your GI tract during the procedure and reduce the bloating. If you had a lower endoscopy (such as a colonoscopy or flexible sigmoidoscopy) you may notice spotting of blood in your stool or on the toilet paper. If you underwent a bowel prep for your procedure, you may not have a normal bowel movement for a few days.  Please Note:  You might notice some irritation and congestion in your nose or some drainage.  This is from the oxygen used during your procedure.  There is no need for concern and it should clear up in a day or so.  SYMPTOMS TO REPORT IMMEDIATELY:   Following lower endoscopy (colonoscopy or flexible sigmoidoscopy):  Excessive amounts of blood in the stool  Significant tenderness or worsening of abdominal pains  Swelling of the abdomen that is new, acute  Fever of 100F or higher   Following upper endoscopy (EGD)  Vomiting of blood or coffee ground material  New chest pain or pain under the shoulder blades  Painful or persistently difficult swallowing  New shortness of breath  Fever of 100F or higher  Black, tarry-looking stools  For urgent or emergent issues, a gastroenterologist can be reached at any hour by calling [phone]. Do not use MyChart  messaging for urgent concerns.    DIET:  We do recommend a small meal at first, but then you may proceed to your regular diet.  Drink plenty of fluids but you should avoid alcoholic beverages for 24 hours.  ACTIVITY:  You should plan to take it easy for the rest of today and you should NOT DRIVE or use heavy machinery until tomorrow (because of the sedation medicines used during the test).    FOLLOW UP: Our staff will call the number listed on your records 48-72 hours following your procedure to check on you and address any questions or concerns that you may have regarding the information given to you following your procedure. If we do not reach you, we will leave a message.  We will attempt to reach you two times.  During this call, we will ask if you have developed any symptoms of COVID 19. If you develop any symptoms (ie: fever, flu-like symptoms, shortness of breath, cough etc.) before then, please call [phone].  If you test positive for Covid 19 in the 2 weeks post procedure, please call and report this information to [place].    If any biopsies were taken you will be contacted by phone or by letter within the next 1-3 weeks.  Please call us at [phone] if you have not heard about the biopsies in 3 weeks.    SIGNATURES/CONFIDENTIALITY: You and/or your care partner have signed paperwork which will be entered into your electronic medical record.  These signatures  attest to the fact that that the information above on your After Visit Summary has been reviewed and is understood.  Full responsibility of the confidentiality of this discharge information lies with you and/or your care-partner.

## 2019-07-17 NOTE — Progress Notes (Signed)
Vitals-DT Temp-LS  Pt's states no medical or surgical changes since previsit or office visit.

## 2019-07-17 NOTE — Progress Notes (Signed)
To PACU, VSS. Report to [email or web address] 

## 2019-07-17 NOTE — Progress Notes (Signed)
Called to room to assist during endoscopic procedure.  Patient ID and intended procedure confirmed with present staff. Received instructions for my participation in the procedure from the performing physician.  

## 2019-07-18 ENCOUNTER — Other Ambulatory Visit: Payer: Self-pay | Admitting: *Deleted

## 2019-07-18 DIAGNOSIS — K209 Esophagitis, unspecified without bleeding: Secondary | ICD-10-CM

## 2019-07-18 DIAGNOSIS — K219 Gastro-esophageal reflux disease without esophagitis: Secondary | ICD-10-CM

## 2019-07-18 MED ORDER — FAMOTIDINE 40 MG PO TABS: 40.0000 mg | ORAL_TABLET | Freq: Two times a day (BID) | ORAL | 1 refills | Status: DC

## 2019-07-19 ENCOUNTER — Telehealth: Payer: Self-pay | Admitting: *Deleted

## 2019-07-19 ENCOUNTER — Encounter: Payer: Self-pay | Admitting: Internal Medicine

## 2019-07-19 NOTE — Telephone Encounter (Signed)
Message left

## 2019-07-22 ENCOUNTER — Telehealth: Payer: Self-pay | Admitting: *Deleted

## 2019-07-22 NOTE — Telephone Encounter (Signed)
Patient called and asked if you know anything about radio frequency ablation for neck pain. Her pain doctor has been doing this procedure and she feels like he is giving her the run around, wanted to know your opinion-if you happen to know anything about this.

## 2019-07-22 NOTE — Telephone Encounter (Signed)
Patient advised.

## 2019-07-22 NOTE — Telephone Encounter (Signed)
Advise pt--I have had patients get this treatment, some more successful than others, but is an option she can explore if pain is significant.  You can tell her even my dad had it done--one side worked better than the other, so not a perfect treatment, but can improve quality of life by reducing the pain some.

## 2019-07-23 DIAGNOSIS — R2230 Localized swelling, mass and lump, unspecified upper limb: Secondary | ICD-10-CM | POA: Diagnosis not present

## 2019-07-24 ENCOUNTER — Other Ambulatory Visit: Payer: Self-pay | Admitting: Family Medicine

## 2019-07-24 DIAGNOSIS — I10 Essential (primary) hypertension: Secondary | ICD-10-CM

## 2019-08-01 ENCOUNTER — Ambulatory Visit: Payer: PPO | Admitting: Obstetrics and Gynecology

## 2019-08-09 ENCOUNTER — Other Ambulatory Visit: Payer: Self-pay | Admitting: Internal Medicine

## 2019-08-09 DIAGNOSIS — K209 Esophagitis, unspecified without bleeding: Secondary | ICD-10-CM

## 2019-08-09 DIAGNOSIS — K219 Gastro-esophageal reflux disease without esophagitis: Secondary | ICD-10-CM

## 2019-08-23 ENCOUNTER — Other Ambulatory Visit: Payer: Self-pay | Admitting: Internal Medicine

## 2019-08-23 DIAGNOSIS — K209 Esophagitis, unspecified without bleeding: Secondary | ICD-10-CM

## 2019-08-23 DIAGNOSIS — K219 Gastro-esophageal reflux disease without esophagitis: Secondary | ICD-10-CM

## 2019-08-28 ENCOUNTER — Other Ambulatory Visit: Payer: PPO

## 2019-08-29 ENCOUNTER — Other Ambulatory Visit: Payer: Self-pay

## 2019-08-29 ENCOUNTER — Other Ambulatory Visit: Payer: PPO

## 2019-08-29 DIAGNOSIS — I10 Essential (primary) hypertension: Secondary | ICD-10-CM

## 2019-08-29 DIAGNOSIS — Z13220 Encounter for screening for lipoid disorders: Secondary | ICD-10-CM | POA: Diagnosis not present

## 2019-08-29 DIAGNOSIS — Z5181 Encounter for therapeutic drug level monitoring: Secondary | ICD-10-CM | POA: Diagnosis not present

## 2019-08-29 DIAGNOSIS — E559 Vitamin D deficiency, unspecified: Secondary | ICD-10-CM

## 2019-08-29 DIAGNOSIS — M8580 Other specified disorders of bone density and structure, unspecified site: Secondary | ICD-10-CM

## 2019-08-30 ENCOUNTER — Other Ambulatory Visit: Payer: PPO

## 2019-08-30 LAB — COMPREHENSIVE METABOLIC PANEL
ALT: 18 IU/L (ref 0–32)
AST: 18 IU/L (ref 0–40)
Albumin/Globulin Ratio: 2.4 — ABNORMAL HIGH (ref 1.2–2.2)
Albumin: 4.5 g/dL (ref 3.7–4.7)
Alkaline Phosphatase: 104 IU/L (ref 48–121)
BUN/Creatinine Ratio: 23 (ref 12–28)
BUN: 18 mg/dL (ref 8–27)
Bilirubin Total: 0.2 mg/dL (ref 0.0–1.2)
CO2: 25 mmol/L (ref 20–29)
Calcium: 9.2 mg/dL (ref 8.7–10.3)
Chloride: 104 mmol/L (ref 96–106)
Creatinine, Ser: 0.77 mg/dL (ref 0.57–1.00)
GFR calc Af Amer: 87 mL/min/{1.73_m2} (ref >59)
GFR calc non Af Amer: 75 mL/min/{1.73_m2} (ref >59)
Globulin, Total: 1.9 g/dL (ref 1.5–4.5)
Glucose: 102 mg/dL — ABNORMAL HIGH (ref 65–99)
Potassium: 4.7 mmol/L (ref 3.5–5.2)
Sodium: 143 mmol/L (ref 134–144)
Total Protein: 6.4 g/dL (ref 6.0–8.5)

## 2019-08-30 LAB — CBC WITH DIFFERENTIAL/PLATELET
Basophils Absolute: 0 10*3/uL (ref 0.0–0.2)
Basos: 1 %
EOS (ABSOLUTE): 0.1 10*3/uL (ref 0.0–0.4)
Eos: 3 %
Hematocrit: 38.7 % (ref 34.0–46.6)
Hemoglobin: 13 g/dL (ref 11.1–15.9)
Immature Grans (Abs): 0 10*3/uL (ref 0.0–0.1)
Immature Granulocytes: 0 %
Lymphocytes Absolute: 1.6 10*3/uL (ref 0.7–3.1)
Lymphs: 31 %
MCH: 29.4 pg (ref 26.6–33.0)
MCHC: 33.6 g/dL (ref 31.5–35.7)
MCV: 88 fL (ref 79–97)
Monocytes Absolute: 0.5 10*3/uL (ref 0.1–0.9)
Monocytes: 9 %
Neutrophils Absolute: 2.8 10*3/uL (ref 1.4–7.0)
Neutrophils: 56 %
Platelets: 376 10*3/uL (ref 150–450)
RBC: 4.42 x10E6/uL (ref 3.77–5.28)
RDW: 11.7 % (ref 11.7–15.4)
WBC: 5 10*3/uL (ref 3.4–10.8)

## 2019-08-30 LAB — LIPID PANEL
Chol/HDL Ratio: 2.6 ratio (ref 0.0–4.4)
Cholesterol, Total: 203 mg/dL — ABNORMAL HIGH (ref 100–199)
HDL: 77 mg/dL (ref >39)
LDL Chol Calc (NIH): 113 mg/dL — ABNORMAL HIGH (ref 0–99)
Triglycerides: 70 mg/dL (ref 0–149)
VLDL Cholesterol Cal: 13 mg/dL (ref 5–40)

## 2019-08-30 LAB — VITAMIN D 25 HYDROXY (VIT D DEFICIENCY, FRACTURES): Vit D, 25-Hydroxy: 40 ng/mL (ref 30.0–100.0)

## 2019-09-03 NOTE — Progress Notes (Signed)
Chief Complaint  Patient presents with  . Medicare Wellness    nonfasting AWV/CPE. Is still having some ankle swelling but it is somewhat better than it was.     [name] is a 74 y.o. female who presents for annual Medicare wellness visit and follow-up on chronic medical conditions.   She has the following concerns:  She has some intermittent swelling in her feet at night, not as bad as they were.  She is drinking lots of water.  This only occurs at the end of the day and is gone by the morning. It has improved since she started walking again.  Sometimes her hands swell a little too.  GERD:  She developed reflux/heartburn after taking potassium supplements.  She had some improvement on omeprazole, but also caused dizziness, and symptoms recurred when she stopped it. She was referred to GI, who put her on pepcid (20mg  qd), and did EGD.  After EGD, her Pepcid dose was increased to 40mg  BID. EGD showed: - LA Grade C reflux esophagitis. Biopsied (path showed reactive squamous cells) - 4 cm hiatal hernia. - Gastroesophageal flap valve classified as Hill Grade IV (no fold, wide open lumen, hiatal hernia present). - Normal stomach. - Normal examined duodenum.  She is currently taking pepcid 40mg  prn, needing it about every other day, depending on what she eats.  Symptom is just mild heartburn, no longer has pain in the chest. This is mostly occurring after dinner.  She was also due for f/u colonoscopy, which was done the same day as EGD, and was normal (diverticulosis, small internal hemorrhoids; no polyps, no f/u needed).  Hypertension follow-up:She has been taking amlodipine 5mg  daily. BP's have been running 120-125/70's at home. Denies headaches, chest pain, palpitations.  She sometimes gets a little dizzy when indigestion flares.  Intermittently feels a little lightheaded when she gets up from the couch. Edema as mentioned above.  Vitamin D was low at 25.1 last year when checked by her  GYN.  OTC vitamin D 3 dose was increased from 2000 IU to 5000 IU. Recheck on 5000 IU daily in July 2020 was 35.8. She continues to take 5000 IU daily.  Osteopenia. Repeat DEXA 11/2017 was stable, elevated FRAX score for hip fracture. GYN originally referred to endocrinology for this, but I said I'd be happy to follow. She apparently was puton fosamax and didn't like it--started by her GYN. Some of her joint pains improved after stopping it.She also had noted hair loss (med started around the same time as losartan, she wasn't sure which med contributed).They had discussed Evista as an alternativein the past(per GYN's notes, pt doesn't recall this). At her wellness visit last year we discussed Reclast, Boniva/Actonel, Raloxifene. Not candidate for Prolia (not osteoporosis yet); Discussed Ca, D, wt bearing exercise. She had declined any treatment, preferring to wait and repeat DEXA in 11/2019  Chronic neck pain.She just went through a series of injections through Dr. Towanda Malkin office. She didn't get benefit from the 3 injections.  They discussed RF ablation, but never discussed in more detail/scheduled. Possibly not a candidate based on her response to injections? She sees Dr. Patrice Paradise (or his PA) every 6 months, where she gets her hydrocodone (takes 1/2 pill every other day, though lately she has needed a little more frequently, daily last week).SalonPas, topical meds and massage help some. Tylenol Arthritis hasn't been effective. Has used robaxin in the past as well, only helps a little. She no longer has any.Pain no longer radiates  into her arm (had shoulder surgery). She gets massages fairly regularly, and this helps.  Allergies and asthma--she hasn't seen allergist since 2019. She only goes when needed.  She has Symbicort that she only uses when she starts to have bronchitis symptoms. She uses 2 puffs, and doesn't usually need a second dose.  She hasn't needed to use any inhaler all summer. She  also has ProAir, hasn't used it.  Anxiety and insomnia:  On lamictal and ambien per psychiatriast.  Sleeping well and moods are good.  Immunization History  Administered Date(s) Administered  . Influenza Split 11/13/2015  . Influenza, High Dose Seasonal PF 03/05/2014, 12/15/2014, 11/25/2016, 12/04/2017, 11/21/2018  . Influenza,inj,quad, With Preservative 11/28/2016, 11/28/2017  . Influenza-Unspecified 12/29/2012, 11/25/2016  . PFIZER SARS-COV-2 Vaccination 03/15/2019, 04/05/2019  . Pneumococcal Conjugate-13 07/02/2014  . Pneumococcal Polysaccharide-23 08/29/2007, 07/06/2015  . Tdap 08/29/2007, 12/26/2017  . Zoster 08/29/2007  . Zoster Recombinat (Shingrix) 12/17/2018, 02/28/2019   Last Pap smear:03/2014 with GYN, normal; last GYN exam6/2020 (scheduled for 10/2019) Last mammogram:11/2017 Last colonoscopy: 06/2019--diverticulosis, small internal hemorrhoids, no polyps. F/u not needed (prior was in 08/2014, had tubular adenoma) Last DEXA:11/2017 T-2.1 at R fem neck, elevated FRAX risk at hip, 3.6% Dentist: twice yearly Ophtho: yearly Exercise:  Walks 35-45 minutes at least 4 days/week. Just joined [employer], started yesterday, plans to go 2x/week. Hep C screen: negative, done by GYN 04/2015  Other doctors caring for patient include:  GYN:Dr. Talbert Nan Dentist: Dr. Gloriann Loan Ophtho: Dr. Gay Filler Miller([facility] did cataract surgery) Dermatologist: Dr.Gould, Dr. Gwyneth Sprout ([facility]) Podiatrist: Dr. Paulla Dolly Ortho-spine: Dr. Rennis Harding Ortho--Dr. Alvan Dame (seen in the past for L hip bursitis), Dr. Onnie Graham (shoulder) Neurosurgeon--Dr. Sherwood Gambler (no longer sees), PA D[facility] at [facility] (08/2017) Pain doctor--Has seen Dr. Maryjean Ka, Dr. Nelva Bush, also has seen Dr. Brien Few in the past.  Doesn't see one currently, Dr. Towanda Malkin office prescribes her pain medications Psych: Debbe Bales (PA at [facility]) GI: Dr. Hilarie Fredrickson ENT: Dr. Redmond Baseman (cerumen removal) Allergist: Dr. Harold Hedge Physical Therapist:previously sawBrad Vilinda Blanks  ([facility])   Depression screen: negative  Fall screen:none Functional status screen:decreased hearing (has hearing aids, doesn't wear them) Mini-Cog screening: normal See questionnaires in epic.  End of Life Discussion: Patient does havea living willandmedical power of attorney (brought in 08/2018)   PMH, [place], [place] and FH were reviewed and updated  Outpatient Encounter Medications as of 09/04/2019  Medication Sig Note  . amLODipine (NORVASC) 5 MG tablet TAKE 1 TABLET BY MOUTH EVERY DAY   . B Complex-C (B-COMPLEX WITH VITAMIN C) tablet Take 1 tablet by mouth 2 (two) times a day. 08/27/2018: Takes B-complex 100 once daily  . Calcium Carbonate-Vit D-Min (CALCIUM 1200 PO) Take 2,400 mg by mouth daily.  02/27/2019: Unsure of dose, takes 2 together.  . famotidine (PEPCID) 40 MG tablet Take 1 tablet (40 mg total) by mouth 2 (two) times daily. Pharmacy-please d/c rx for 30 day supply 09/04/2019: Using prn, about every other day  . HYDROcodone-acetaminophen (NORCO/VICODIN) 5-325 MG tablet Take 0.5 tablets by mouth every 6 (six) hours as needed for moderate pain. 02/27/2019: Uses 1/2 tablet about every other day  . lamoTRIgine (LAMICTAL) 150 MG tablet Take 150 mg by mouth at bedtime.     . Liniments (SALONPAS PAIN RELIEF PATCH EX) Place 1 patch onto the skin daily as needed (pain).   . Multiple Vitamins-Minerals (HAIR SKIN AND NAILS FORMULA PO) Take 1 tablet by mouth daily.   Marland Kitchen VITAMIN D PO Take 5,000 Units by mouth daily.    Marland Kitchen  zolpidem (AMBIEN) 10 MG tablet Take 2.5 mg by mouth at bedtime.    . budesonide-formoterol (SYMBICORT) 160-4.5 MCG/ACT inhaler Inhale 2 puffs into the lungs daily as needed (shortness of breath).  (Patient not taking: Reported on 09/04/2019) 02/27/2019: Uses it only prn, if she notices a "wheezy cough"; works better than albuterol for prn use  . PROAIR HFA 108 (90 Base) MCG/ACT inhaler Inhale 2 puffs into the lungs every 4 (four) hours as needed for wheezing or shortness of  breath. (Patient not taking: Reported on 05/22/2019) 06/25/2019: On hand  . [DISCONTINUED] famotidine (PEPCID) 20 MG tablet TAKE 1 TABLET BY MOUTH EVERY DAY    No facility-administered encounter medications on file as of 09/04/2019.    Allergies  Allergen Reactions  . Cymbalta [Duloxetine Hcl]     Abdominal pain  . Nitrofurantoin [Macrodantin]   . Sulfamethoxazole Hives  . Cefdinir Rash    ROS: The patient denies anorexia, fever, weight changes, headaches, vision changes, ear pain, sore throat, breast concerns, chest pain, palpitations, dizziness (just slight, per HPI), syncope, dyspnea on exertion,  nausea, vomiting, diarrhea, constipation, abdominal pain, melena, hematochezia, hematuria, incontinence, dysuria,vaginal bleeding, discharge, odor or itch, numbness, tingling, weakness, tremor, suspicious skin lesions, depression, anxiety (well controlled), abnormal bleeding/bruising (just on her forearms) or enlarged lymph nodes. +hearing loss (has hearing aids, doesn't wear them) Neck pain per HPI.  H/o L knee and L hip pain (resolved s/p injections 04/2019) GERD/epigastric pain--pain resolved, occasional reflux, per HPI Vertigo in 04/2019, no further recurrence No recent issues with wheezing/asthma, uses inhalers prn only. +snores (husband is a light sleeper, sleeps in the other room); denies apnea, or  unrefreshed sleep.    PHYSICAL EXAM:  BP 116/74   Pulse 68   Ht 5\' 1"  (1.549 m)   Wt 128 lb 12.8 oz (58.4 kg)   LMP 08/15/1988 (Exact Date)   BMI 24.34 kg/m   Wt Readings from Last 3 Encounters:  09/04/19 128 lb 12.8 oz (58.4 kg)  07/17/19 130 lb (59 kg)  06/25/19 130 lb 2 oz (59 kg)    Well appearing, pleasant elderly female in no distress.  HEENT: conjunctiva and sclera are clear, EOMI. TM's and EAC's normal. Wearing mask Neck: no lymphadenopathy, thyromegaly or mass. No spinal tenderness. WHSS.  She has a somewhat tight (bilaterally, but only tender on L), tender muscle in  the L paraspinous area. No other trigger points. Heart: regular rate and rhythm, no murmur Lungs: clear bilaterally, good air movement Back: no spinal or CVA tenderness, no SI tenderness Abdomen: soft, nontender, no mass Extremities: Normal pulses. Trace edema at ankles only Psych: normal mood, affect, hygiene, grooming, eye contact, speech.  Neuro: alert and oriented, normal gait. Breast and pelvic exams deferred to her GYN Skin: normal turgor.  There are some purpura and bruises noted on her forearms.    Chemistry      Component Value Date/Time   NA 143 08/29/2019 0927   K 4.7 08/29/2019 0927   CL 104 08/29/2019 0927   CO2 25 08/29/2019 0927   BUN 18 08/29/2019 0927   CREATININE 0.77 08/29/2019 0927   CREATININE 0.65 05/23/2016 1110      Component Value Date/Time   CALCIUM 9.2 08/29/2019 0927   ALKPHOS 104 08/29/2019 0927   AST 18 08/29/2019 0927   ALT 18 08/29/2019 0927   BILITOT 0.2 08/29/2019 0927     Fasting glu 102  Lab Results  Component Value Date   WBC 5.0 08/29/2019  HGB 13.0 08/29/2019   HCT 38.7 08/29/2019   MCV 88 08/29/2019   PLT 376 08/29/2019    Vitamin D-OH 40  Lab Results  Component Value Date   CHOL 203 (H) 08/29/2019   HDL 77 08/29/2019   LDLCALC 113 (H) 08/29/2019   TRIG 70 08/29/2019   CHOLHDL 2.6 08/29/2019     ASSESSMENT/PLAN:  Medicare annual wellness visit, subsequent  Gastroesophageal reflux disease with esophagitis without hemorrhage - Encouraged to take Pepcid 40mg  with dinner regularly, since needing it so frequently. Precautions reviewed  Essential hypertension - well controlled, cont amlodipine 5mg   Vitamin D deficiency - adequately replaced on current regimen, continue  Osteopenia with high risk of fracture - cont Ca, D, weight-bearing exercise and repeat DEXA 11/2019 - Plan: DG Bone Density  Chronic neck pain - reviewed conservative measures (heat, massage, stretch, topical meds). Use narcotics sparingly  Mild  intermittent asthma, unspecified whether complicated - reviewed proper way to take preventative meds--should use albuterol prn, vs Symbicort more regularly/preventative  Senile purpura (HCC) - noted on forearms  Impaired fasting glucose - glu 102.  Discussed proper diet, exercise   Discussed monthly self breast exams and yearly mammograms (past due); at least 30 minutes of aerobic activity at least 5 days/week, weight-bearing exercise at least 2x/wk; proper sunscreen use reviewed; healthy diet, including goals of calcium and vitamin D intake and alcohol recommendations (less than or equal to 1 drink/day) reviewed; regular seatbelt use; changing batteries in smoke detectors, carbon monoxide detectors. Immunization recommendations discussed--continue yearly high dose flu shots, UTD. Colonoscopy recommendations reviewed--UTD DEXA due 11/2019.   MOST form reviewed and completed.  Full Code, Full Care.  Symbicort is supposed to be used daily, for prevention of asthma flares.  I don't think you are getting the full benefit from this medication if only using 2 puffs sporadically.  ProAir is the rescue medication, to use when you feel any wheezing or chest tightness. This may be something you should further discuss with the allergist who prescribes these medications for you.  Medicare Attestation I have personally reviewed: The patient's medical and social history Their use of alcohol, tobacco or illicit drugs Their current medications and supplements The patient's functional ability including ADLs,fall risks, home safety risks, cognitive, and hearing and visual impairment Diet and physical activities Evidence for depression or mood disorders  The patient's weight, height, BMI have been recorded in the chart.  I have made referrals, counseling, and provided education to the patient based on review of the above and I have provided the patient with a written personalized care plan for preventive  services.

## 2019-09-03 NOTE — Patient Instructions (Addendum)
HEALTH MAINTENANCE RECOMMENDATIONS:  It is recommended that you get at least 30 minutes of aerobic exercise at least 5 days/week (for weight loss, you may need as much as 60-90 minutes). This can be any activity that gets your heart rate up. This can be divided in 10-15 minute intervals if needed, but try and build up your endurance at least once a week.  Weight bearing exercise is also recommended twice weekly.  Eat a healthy diet with lots of vegetables, fruits and fiber.  "Colorful" foods have a lot of vitamins (ie green vegetables, tomatoes, red peppers, etc).  Limit sweet tea, regular sodas and alcoholic beverages, all of which has a lot of calories and sugar.  Up to 1 alcoholic drink daily may be beneficial for women (unless trying to lose weight, watch sugars).  Drink a lot of water.  Calcium recommendations are 1200-1500 mg daily (1500 mg for postmenopausal women or women without ovaries), and vitamin D 1000 IU daily.  This should be obtained from diet and/or supplements (vitamins), and calcium should not be taken all at once, but in divided doses.  Monthly self breast exams and yearly mammograms for women over the age of 19 is recommended.  Sunscreen of at least SPF 30 should be used on all sun-exposed parts of the skin when outside between the hours of 10 am and 4 pm (not just when at beach or pool, but even with exercise, golf, tennis, and yard work!)  Use a sunscreen that says "broad spectrum" so it covers both UVA and UVB rays, and make sure to reapply every 1-2 hours.  Remember to change the batteries in your smoke detectors when changing your clock times in the spring and fall. Carbon monoxide detectors are recommended for your home.  Use your seat belt every time you are in a car, and please drive safely and not be distracted with cell phones and texting while driving.   [name] , Thank you for taking time to come for your Medicare Wellness Visit. I appreciate your ongoing  commitment to your health goals. Please review the following plan we discussed and let me know if I can assist you in the future.   This is a list of the screening recommended for you and due dates:  Health Maintenance  Topic Date Due  . Mammogram  12/07/2018  . Flu Shot  09/29/2019  . Tetanus Vaccine  12/27/2027  . DEXA scan (bone density measurement)  Completed  . COVID-19 Vaccine  Completed  .  Hepatitis C: One time screening is recommended by Center for Disease Control  (CDC) for  adults born from [birth year] through 1967.   Completed  . Pneumonia vaccines  Completed   Mammograms are due yearly--I do not believe that you had one last year.  [facility] to [facility] one. You will be due for follow-up bone density test in 11/2019.  Please call the [facility] to schedule this (order was entered today).  Continue yearly high dose flu shots in the Fall (ignore the 09/29/2019 date above--get in September/October)  Since you seem to have recurrent mild heartburn about every other day, requiring the use of Pepcid, I suggest that you continue to take the Pepcid on a daily basis.  Take it before you eat dinner. If needed, you can increase it back to twice a day, if flaring.  ed daily, for prevention of asthma flares.  I don't think you are getting the full benefit from this  medication if only using 2 puffs sporadically.  ProAir is the rescue medication, to use when you feel any wheezing or chest tightness. This may be something you should further discuss with the allergist who prescribes these medications for you.

## 2019-09-04 ENCOUNTER — Ambulatory Visit (INDEPENDENT_AMBULATORY_CARE_PROVIDER_SITE_OTHER): Payer: PPO | Admitting: Family Medicine

## 2019-09-04 ENCOUNTER — Encounter: Payer: Self-pay | Admitting: Family Medicine

## 2019-09-04 ENCOUNTER — Other Ambulatory Visit: Payer: Self-pay

## 2019-09-04 VITALS — BP 116/74 | HR 68 | Ht 61.0 in | Wt 128.8 lb

## 2019-09-04 DIAGNOSIS — G8929 Other chronic pain: Secondary | ICD-10-CM

## 2019-09-04 DIAGNOSIS — E559 Vitamin D deficiency, unspecified: Secondary | ICD-10-CM

## 2019-09-04 DIAGNOSIS — K2100 Gastro-esophageal reflux disease with esophagitis, without bleeding: Secondary | ICD-10-CM

## 2019-09-04 DIAGNOSIS — M542 Cervicalgia: Secondary | ICD-10-CM

## 2019-09-04 DIAGNOSIS — J4520 Mild intermittent asthma, uncomplicated: Secondary | ICD-10-CM

## 2019-09-04 DIAGNOSIS — D692 Other nonthrombocytopenic purpura: Secondary | ICD-10-CM

## 2019-09-04 DIAGNOSIS — M8580 Other specified disorders of bone density and structure, unspecified site: Secondary | ICD-10-CM | POA: Diagnosis not present

## 2019-09-04 DIAGNOSIS — I10 Essential (primary) hypertension: Secondary | ICD-10-CM

## 2019-09-04 DIAGNOSIS — R7301 Impaired fasting glucose: Secondary | ICD-10-CM | POA: Diagnosis not present

## 2019-09-04 DIAGNOSIS — Z0000 Encounter for general adult medical examination without abnormal findings: Secondary | ICD-10-CM

## 2019-09-10 ENCOUNTER — Other Ambulatory Visit: Payer: Self-pay | Admitting: Family Medicine

## 2019-09-10 ENCOUNTER — Other Ambulatory Visit: Payer: Self-pay | Admitting: Internal Medicine

## 2019-09-10 DIAGNOSIS — I10 Essential (primary) hypertension: Secondary | ICD-10-CM

## 2019-09-10 DIAGNOSIS — K219 Gastro-esophageal reflux disease without esophagitis: Secondary | ICD-10-CM

## 2019-09-10 DIAGNOSIS — K209 Esophagitis, unspecified without bleeding: Secondary | ICD-10-CM

## 2019-09-10 MED ORDER — FAMOTIDINE 40 MG PO TABS: 40.0000 mg | ORAL_TABLET | Freq: Two times a day (BID) | ORAL | 0 refills | Status: DC

## 2019-09-10 NOTE — Telephone Encounter (Signed)
Called and spoke with patient, to let her know that a new script for 90 days had been sent to her pharmacy on 08/26/19. She stated that she had been trying to use the Rx# on the bottle to do her refill. I had advised her that since that bottle needed refills that it probably did not pull up her new refill that should be available.

## 2019-09-10 NOTE — Telephone Encounter (Signed)
Pt is requesting a medication refill on her famotidine.

## 2019-09-10 NOTE — Telephone Encounter (Signed)
Pepcid refilled

## 2019-09-17 ENCOUNTER — Encounter: Payer: Self-pay | Admitting: *Deleted

## 2019-09-30 ENCOUNTER — Encounter: Payer: Self-pay | Admitting: Internal Medicine

## 2019-09-30 ENCOUNTER — Ambulatory Visit: Payer: PPO | Admitting: Internal Medicine

## 2019-09-30 VITALS — BP 120/74 | HR 81 | Ht 61.0 in | Wt 128.2 lb

## 2019-09-30 DIAGNOSIS — K2100 Gastro-esophageal reflux disease with esophagitis, without bleeding: Secondary | ICD-10-CM | POA: Diagnosis not present

## 2019-09-30 DIAGNOSIS — K5900 Constipation, unspecified: Secondary | ICD-10-CM | POA: Diagnosis not present

## 2019-09-30 DIAGNOSIS — K449 Diaphragmatic hernia without obstruction or gangrene: Secondary | ICD-10-CM

## 2019-09-30 DIAGNOSIS — K648 Other hemorrhoids: Secondary | ICD-10-CM | POA: Diagnosis not present

## 2019-09-30 NOTE — Progress Notes (Signed)
   Subjective:    Patient ID: [name], female    DOB: April 30, 1945, 74 y.o.   MRN: [id number]  HPI Shama pain is a 74 year old female with a history of GERD with esophagitis, hiatal hernia, mild constipation, internal hemorrhoids, history of asthma who is seen for follow-up.  She is here alone today.  She was last seen in the time of upper endoscopy performed to evaluate heartburn and reflux disease as well as surveillance colonoscopy.  EGD was performed on 07/17/2019.  This showed LA grade C esophagitis, 4 cm hiatal hernia.  Normal stomach and examined duodenum.  Esophagitis was biopsied which showed reactive changes but no dysplasia.  Colonoscopy on the same day was normal with the exception of diverticulosis in the left colon and small hemorrhoids.  After upper endoscopy I placed her on famotidine 40 mg twice daily because omeprazole had caused dizziness.  She reports that symptoms have improved though she can still have heartburn indigestion depending on what she eats.  She is not having dysphagia or odynophagia.  She is sleeping well.  No nocturnal coughing or reflux symptoms.  No nausea or vomiting.  Bowel movements have been regular though occasionally hard which can cause some minor hemorrhoidal irritation and bleeding.  She reports she has not been consistent and a little bit "lax" about her famotidine.  She is taking it usually once daily but occasionally skipping days entirely.  She is using sometimes when she has breakthrough heartburn   Review of Systems As per HPI, otherwise negative  Current Medications, Allergies, Past Medical History, Past Surgical History, Family History and Social History were reviewed in [employer] record.     Objective:   Physical Exam BP 120/74   Pulse 81   Ht 5\' 1"  (1.549 m)   Wt 128 lb 4 oz (58.2 kg)   LMP 08/15/1988 (Exact Date)   BMI 24.23 kg/m  Gen: awake, alert, NAD HEENT: anicteric CV: RRR, no mrg Pulm: CTA  b/l Abd: soft, NT/ND, +BS throughout Ext: no c/c/e Neuro: nonfocal       Assessment & Plan:  74 year old female with a history of GERD with esophagitis, hiatal hernia, mild constipation, internal hemorrhoids, history of asthma who is seen for follow-up.   1. GERD with esophagitis and hiatal hernia --we discussed her reflux disease today and the fact that her esophagitis was moderate to severe.  I explained the need for chronic acid suppression given her esophagitis and risk of complications if this does not heal.  She has not been taking her famotidine consistently we also discussed that this is typically a 10 to 12-hour medication which warrants twice daily dosing.  She had dizziness with omeprazole and so we have avoided PPI.  When taking famotidine scheduled her symptoms had improved though she has had some recurrence recently. --Resume famotidine 40 mg twice daily --Repeat upper endoscopy in about 6 weeks to document healing of esophagitis  2.  Mild constipation/hemorrhoids --constipation exacerbating internal hemorrhoids with minor bleeding.  Normal colonoscopy recently as above.  I recommended she begin docusate 200 mg nightly.  Can use over-the-counter preparation cream or ointment for hemorrhoidal irritation.  I expect hemorrhoids will improve as constipation improves if not we can consider other therapy such as banding --Docusate 200 mg nightly  30 minutes total spent today including patient facing time, coordination of care, reviewing medical history/procedures/pertinent radiology studies, and documentation of the encounter.

## 2019-09-30 NOTE — Patient Instructions (Addendum)
If you are age 76 or older, your body mass index should be between 23-30. Your Body mass index is 24.23 kg/m. If this is out of the aforementioned range listed, please consider follow up with your Primary Care Provider.  If you are age 61 or younger, your body mass index should be between 19-25. Your Body mass index is 24.23 kg/m. If this is out of the aformentioned range listed, please consider follow up with your Primary Care Provider.   You have been scheduled for an endoscopy. Please follow written instructions given to you at your visit today. If you use inhalers (even only as needed), please bring them with you on the day of your procedure.  Please purchase the following medications over the counter and take as directed:  START: docusate 200mg  at bed time.  CONTINUE: famotidine 40mg  take one tablet twice daily.  Due to recent changes in healthcare laws, you may see the results of your imaging and laboratory studies on MyChart before your provider has had a chance to review them.  We understand that in some cases there may be results that are confusing or concerning to you. Not all laboratory results come back in the same time frame and the provider may be waiting for multiple results in order to interpret others.  Please give [place] 48 hours in order for your provider to thoroughly review all the results before contacting the office for clarification of your results.   Thank you for entrusting me with your care and choosing [facility].  Dr Hilarie Fredrickson

## 2019-10-07 DIAGNOSIS — M47812 Spondylosis without myelopathy or radiculopathy, cervical region: Secondary | ICD-10-CM | POA: Diagnosis not present

## 2019-10-07 DIAGNOSIS — M4322 Fusion of spine, cervical region: Secondary | ICD-10-CM | POA: Diagnosis not present

## 2019-10-07 DIAGNOSIS — G894 Chronic pain syndrome: Secondary | ICD-10-CM | POA: Diagnosis not present

## 2019-10-07 DIAGNOSIS — M4302 Spondylolysis, cervical region: Secondary | ICD-10-CM | POA: Diagnosis not present

## 2019-10-30 ENCOUNTER — Ambulatory Visit: Payer: PPO | Admitting: Family Medicine

## 2019-10-31 ENCOUNTER — Ambulatory Visit: Payer: PPO | Admitting: Family Medicine

## 2019-11-22 ENCOUNTER — Ambulatory Visit: Payer: PPO | Admitting: Obstetrics and Gynecology

## 2019-11-22 ENCOUNTER — Telehealth: Payer: Self-pay

## 2019-11-22 ENCOUNTER — Encounter: Payer: Self-pay | Admitting: Obstetrics and Gynecology

## 2019-11-22 NOTE — Progress Notes (Deleted)
74 y.o. G69P3003 Married White or Caucasian Not Hispanic or Latino female here for annual exam.      Patient's last menstrual period was 08/15/1988 (exact date).          Sexually active: {yes no:314532}  The current method of family planning is post menopausal status.    Exercising: {yes no:314532}  {types:19826} Smoker:  no  Health Maintenance: Pap:  04/15/14 WNL History of abnormal Pap:  no MMG:  12/06/17 BIRADS 1 negative/density b BMD:   12/06/17 Osteopenia Colonoscopy: 07/17/19 no f/u needed TDaP:  12/26/17  Gardasil: n/a   reports that she has quit smoking. Her smoking use included cigarettes. She quit after 1.00 year of use. She has never used smokeless tobacco. She reports current alcohol use. She reports that she does not use drugs.  Past Medical History:  Diagnosis Date  . Anxiety    sees [facility] [place], [place])  . Asthma   . Colon polyps 08/2014   tubular adenoma and hyperplastic polyps (Dr. Hilarie Fredrickson)  . DDD (degenerative disc disease), cervical dr Patrice Paradise   s/p surgery, ongoing pain  . Depression    h/o hospitalization with ECT  . Diverticulosis   . Esophagitis   . Hiatal hernia   . History of insomnia   . Hypertension   . Internal hemorrhoids   . Neck pain   . Osteopenia stopped fosamax ~2006  . Post-operative nausea and vomiting   . Vertigo   . Vitamin D deficiency borderline(28)-2010   treated by her GYN    Past Surgical History:  Procedure Laterality Date  . CATARACT EXTRACTION, BILATERAL Bilateral Sept/October 2019  . HEMORRHOID SURGERY  2006, 12/2014  . KNEE ARTHROSCOPY Right   . LASIK     Dr. Lucita Ferrara  . NASAL SEPTOPLASTY W/ TURBINOPLASTY  1986  . NECK SURGERY  02/2010([place])1/03,2005([place])  . REDUCTION MAMMAPLASTY Bilateral 07/24/2008  . TONSILLECTOMY    . TOTAL HIP ARTHROPLASTY Left 08/2007      . TOTAL SHOULDER ARTHROPLASTY Left 08/30/2018   Procedure: TOTAL SHOULDER ARTHROPLASTY;  Surgeon: Justice Britain, MD;  Location: [facility];  Service:  Orthopedics;  Laterality: Left;  129min, move scope to 12:30pm    Current Outpatient Medications  Medication Sig Dispense Refill  . amLODipine (NORVASC) 5 MG tablet TAKE 1 TABLET BY MOUTH EVERY DAY 90 tablet 1  . B Complex-C (B-COMPLEX WITH VITAMIN C) tablet Take 1 tablet by mouth 2 (two) times a day.    . budesonide-formoterol (SYMBICORT) 160-4.5 MCG/ACT inhaler Inhale 2 puffs into the lungs daily as needed (shortness of breath).     . Calcium Carbonate-Vit D-Min (CALCIUM 1200 PO) Take 2,400 mg by mouth daily.     . famotidine (PEPCID) 40 MG tablet Take 1 tablet (40 mg total) by mouth 2 (two) times daily. Pharmacy-please d/c rx for 30 day supply 180 tablet 0  . HYDROcodone-acetaminophen (NORCO/VICODIN) 5-325 MG tablet Take 0.5 tablets by mouth every 6 (six) hours as needed for moderate pain.    Marland Kitchen lamoTRIgine (LAMICTAL) 150 MG tablet Take 150 mg by mouth at bedtime.      . Liniments (SALONPAS PAIN RELIEF PATCH EX) Place 1 patch onto the skin daily as needed (pain).    . Multiple Vitamins-Minerals (HAIR SKIN AND NAILS FORMULA PO) Take 1 tablet by mouth daily.    Marland Kitchen PROAIR HFA 108 (90 Base) MCG/ACT inhaler Inhale 2 puffs into the lungs every 4 (four) hours as needed for wheezing or shortness of breath. 1 Inhaler 1  .  VITAMIN D PO Take 5,000 Units by mouth daily.     Marland Kitchen zolpidem (AMBIEN) 10 MG tablet Take 2.5 mg by mouth at bedtime.      No current facility-administered medications for this visit.    Family History  Problem Relation Age of Onset  . Arthritis Mother   . Depression Mother   . Pernicious anemia Mother   . Heart disease Father 71  . Heart attack Father   . Depression Brother   . Diabetes Paternal Uncle   . Breast cancer Cousin        paternal  . Colon cancer Neg Hx   . Rectal cancer Neg Hx   . Stomach cancer Neg Hx     Review of Systems  Exam:   LMP 08/15/1988 (Exact Date)   Weight change: @WEIGHTCHANGE @ Height:      Ht Readings from Last 3 Encounters:  09/30/19 5'  1" (1.549 m)  09/04/19 5\' 1"  (1.549 m)  07/17/19 5\' 1"  (1.549 m)    General appearance: alert, cooperative and appears stated age Head: Normocephalic, without obvious abnormality, atraumatic Neck: no adenopathy, supple, symmetrical, trachea midline and thyroid {CHL AMB PHY EX THYROID NORM DEFAULT:[id number]::"normal to inspection and palpation"} Lungs: clear to auscultation bilaterally Cardiovascular: regular rate and rhythm Breasts: {Exam; breast:13139::"normal appearance, no masses or tenderness"} Abdomen: soft, non-tender; non distended,  no masses,  no organomegaly Extremities: extremities normal, atraumatic, no cyanosis or edema Skin: Skin color, texture, turgor normal. No rashes or lesions Lymph nodes: Cervical, supraclavicular, and axillary nodes normal. No abnormal inguinal nodes palpated Neurologic: Grossly normal   Pelvic: External genitalia:  no lesions              Urethra:  normal appearing urethra with no masses, tenderness or lesions              Bartholins and Skenes: normal                 Vagina: normal appearing vagina with normal color and discharge, no lesions              Cervix: {CHL AMB PHY EX CERVIX NORM DEFAULT:[id number]::"no lesions"}               Bimanual Exam:  Uterus:  {CHL AMB PHY EX UTERUS NORM DEFAULT:[phone]::"normal size, contour, position, consistency, mobility, non-tender"}              Adnexa: {CHL AMB PHY EX ADNEXA NO MASS DEFAULT:[phone]::"no mass, fullness, tenderness"}               Rectovaginal: Confirms               Anus:  normal sphincter tone, no lesions  *** chaperoned for the exam.  A:  Well Woman with normal exam  P:

## 2019-11-22 NOTE — Telephone Encounter (Signed)
Patient did not keep appointment for today.  °

## 2019-12-02 ENCOUNTER — Telehealth: Payer: Self-pay

## 2019-12-02 ENCOUNTER — Encounter: Payer: Self-pay | Admitting: Internal Medicine

## 2019-12-02 ENCOUNTER — Ambulatory Visit (AMBULATORY_SURGERY_CENTER): Payer: PPO | Admitting: Internal Medicine

## 2019-12-02 ENCOUNTER — Other Ambulatory Visit: Payer: Self-pay

## 2019-12-02 VITALS — BP 125/74 | HR 64 | Temp 97.3°F | Resp 13 | Ht 61.0 in | Wt 128.0 lb

## 2019-12-02 DIAGNOSIS — K449 Diaphragmatic hernia without obstruction or gangrene: Secondary | ICD-10-CM | POA: Diagnosis not present

## 2019-12-02 DIAGNOSIS — J45909 Unspecified asthma, uncomplicated: Secondary | ICD-10-CM | POA: Diagnosis not present

## 2019-12-02 DIAGNOSIS — I10 Essential (primary) hypertension: Secondary | ICD-10-CM | POA: Diagnosis not present

## 2019-12-02 DIAGNOSIS — K2100 Gastro-esophageal reflux disease with esophagitis, without bleeding: Secondary | ICD-10-CM

## 2019-12-02 DIAGNOSIS — K219 Gastro-esophageal reflux disease without esophagitis: Secondary | ICD-10-CM | POA: Diagnosis not present

## 2019-12-02 MED ORDER — SODIUM CHLORIDE 0.9 % IV SOLN: 500.0000 mL | INTRAVENOUS | Status: DC

## 2019-12-02 NOTE — Telephone Encounter (Signed)
Pt. Called said she wanted to know if she could get her Iron level checked here. Sh has been feeling tired for 2-3 weeks now. I told her that would have to be ran by the doctor first.

## 2019-12-02 NOTE — Progress Notes (Signed)
Report to PACU, RN, vss, BBS= Clear.  

## 2019-12-02 NOTE — Op Note (Signed)
[facility] Patient Name: [name] Procedure Date: 12/02/2019 11:27 AM MRN: [id number] Endoscopist: Jerene Bears , MD Age: 74 Referring MD:  Date of Birth: 04-08-1945 Gender: Female Account #: [id number] Procedure:                Upper GI endoscopy Indications:              Follow-up of reflux esophagitis seen at EGD in May                            2021 (biopsies at that time benign without                            Barrett's), currently on famotidine 40 mg twice                            daily as PPI caused dizziness Medicines:                Monitored Anesthesia Care Procedure:                Pre-Anesthesia Assessment:                           - Prior to the procedure, a History and Physical                            was performed, and patient medications and                            allergies were reviewed. The patient's tolerance of                            previous anesthesia was also reviewed. The risks                            and benefits of the procedure and the sedation                            options and risks were discussed with the patient.                            All questions were answered, and informed consent                            was obtained. Prior Anticoagulants: The patient has                            taken no previous anticoagulant or antiplatelet                            agents. ASA Grade Assessment: II - A patient with                            mild systemic disease. After reviewing the risks  and benefits, the patient was deemed in                            satisfactory condition to undergo the procedure.                           After obtaining informed consent, the endoscope was                            passed under direct vision. Throughout the                            procedure, the patient's blood pressure, pulse, and                            oxygen saturations were monitored  continuously. The                            Endoscope was introduced through the mouth, and                            advanced to the second part of duodenum. The upper                            GI endoscopy was accomplished without difficulty.                            The patient tolerated the procedure well. Scope In: Scope Out: Findings:                 LA Grade A (one or more mucosal breaks less than 5                            mm, not extending between tops of 2 mucosal folds)                            esophagitis with no bleeding was found 32 cm from                            the incisors. Esophagitis has improved compared to                            May 2021.                           A 4 cm hiatal hernia was present (located between                            32-36 cm from incisors).                           The entire examined stomach was normal.  The examined duodenum was normal. Complications:            No immediate complications. Estimated Blood Loss:     Estimated blood loss: none. Impression:               - LA Grade A reflux esophagitis with no bleeding.                            Improved compared to May 2021, but not completely                            resolved. Persistent inflammation is predominantly                            related to hiatal hernia.                           - 4 cm hiatal hernia.                           - Normal stomach.                           - Normal examined duodenum.                           - No specimens collected. Recommendation:           - Patient has a contact number available for                            emergencies. The signs and symptoms of potential                            delayed complications were discussed with the                            patient. Return to normal activities tomorrow.                            Written discharge instructions were provided to the                             patient.                           - Resume previous diet.                           - Continue present medications including famotidine                            40 mg twice daily.                           - GERD diet and anti-reflux lifestyle modifications.                           -  For persistent constipation continue fiber                            supplement and add docusate 200 mg each evening                            (previously Dulcolax caused lower abdominal                            cramping and pain). MiraLax 17 g daily can be tried                            as well if docusate is not helpful. If this problem                            continues please contact my office for follow-up                            visit. Jerene Bears, MD 12/02/2019 11:47:44 AM This report has been signed electronically.

## 2019-12-02 NOTE — Telephone Encounter (Signed)
No labs without a visit.  She had normal blood counts in July.  I see that she had endoscopy today, and there was no evidence of bleeding. She should continue her pepcid (famotidine) twice daily as directed.  She can schedule OV to discuss her fatigue, and appropriate labs can be ordered at the visit.   Results showed: LA Grade A reflux esophagitis with no bleeding. Improved compared to May 2021, but not completely resolved. Persistent inflammation is predominantly related to hiatal hernia. - 4 cm hiatal hernia. - Normal stomach. - Normal examined duodenum.

## 2019-12-02 NOTE — Patient Instructions (Signed)
YOU HAD AN ENDOSCOPIC PROCEDURE TODAY AT [facility]:   Refer to the procedure report that was given to you for any specific questions about what was found during the examination.  If the procedure report does not answer your questions, please call your gastroenterologist to clarify.  If you requested that your care partner not be given the details of your procedure findings, then the procedure report has been included in a sealed envelope for you to review at your convenience later.  YOU SHOULD EXPECT: Some feelings of bloating in the abdomen. Passage of more gas than usual.  Walking can help get rid of the air that was put into your GI tract during the procedure and reduce the bloating. If you had a lower endoscopy (such as a colonoscopy or flexible sigmoidoscopy) you may notice spotting of blood in your stool or on the toilet paper. If you underwent a bowel prep for your procedure, you may not have a normal bowel movement for a few days.  Please Note:  You might notice some irritation and congestion in your nose or some drainage.  This is from the oxygen used during your procedure.  There is no need for concern and it should clear up in a day or so.  SYMPTOMS TO REPORT IMMEDIATELY:   Following upper endoscopy (EGD)  Vomiting of blood or coffee ground material  New chest pain or pain under the shoulder blades  Painful or persistently difficult swallowing  New shortness of breath  Fever of 100F or higher  Black, tarry-looking stools  For urgent or emergent issues, a gastroenterologist can be reached at any hour by calling [phone]. Do not use MyChart messaging for urgent concerns.    DIET:  We do recommend a small meal at first, but then you may proceed to your regular diet.  Drink plenty of fluids but you should avoid alcoholic beverages for 24 hours.  ACTIVITY:  You should plan to take it easy for the rest of today and you should NOT DRIVE or use heavy machinery  until tomorrow (because of the sedation medicines used during the test).    FOLLOW UP: Our staff will call the number listed on your records 48-72 hours following your procedure to check on you and address any questions or concerns that you may have regarding the information given to you following your procedure. If we do not reach you, we will leave a message.  We will attempt to reach you two times.  During this call, we will ask if you have developed any symptoms of COVID 19. If you develop any symptoms (ie: fever, flu-like symptoms, shortness of breath, cough etc.) before then, please call [phone].  If you test positive for Covid 19 in the 2 weeks post procedure, please call and report this information to [place].    SIGNATURES/CONFIDENTIALITY: You and/or your care partner have signed paperwork which will be entered into your electronic medical record.  These signatures attest to the fact that that the information above on your After Visit Summary has been reviewed and is understood.  Full responsibility of the confidentiality of this discharge information lies with you and/or your care-partner.   Continue your normal medications  Please read over GERD handout  Please follow Dr. Vena Rua recommendations about constipation- continue fiber supplement and add Docusate 200 mg each evening, Miralax 17 gram daily can be tried as well if Docusate not helpful  Call Dr. Quentin Mulling office is problems persist

## 2019-12-02 NOTE — Telephone Encounter (Signed)
I called the pt. And gave her all the info you provided and she said she will have to call back to schedule an apt.

## 2019-12-02 NOTE — Progress Notes (Signed)
Vs CW ° °

## 2019-12-04 ENCOUNTER — Telehealth: Payer: Self-pay | Admitting: *Deleted

## 2019-12-04 NOTE — Telephone Encounter (Signed)
  Follow up Call-  Call back number 12/02/2019 07/17/2019  Post procedure Call Back phone  # [phone]  Permission to leave phone message Yes Yes  Some recent data might be hidden     Patient questions:  Do you have a fever, pain , or abdominal swelling? No. Pain Score  0 *  Have you tolerated food without any problems? Yes.    Have you been able to return to your normal activities? Yes.    Do you have any questions about your discharge instructions: Diet   No. Medications  No. Follow up visit  No.  Do you have questions or concerns about your Care? No.  Actions: * If pain score is 4 or above: No action needed, pain <4.  1. Have you developed a fever since your procedure? no  2.   Have you had an respiratory symptoms (SOB or cough) since your procedure? no  3.   Have you tested positive for COVID 19 since your procedure no  4.   Have you had any family members/close contacts diagnosed with the COVID 19 since your procedure?  no   If yes to any of these questions please route to Joylene John, RN and Joella Prince, RN

## 2019-12-10 ENCOUNTER — Ambulatory Visit
Admission: RE | Admit: 2019-12-10 | Discharge: 2019-12-10 | Disposition: A | Discharge status: Home / Self Care | Payer: PPO | Source: Ambulatory Visit | Attending: Family Medicine | Admitting: Family Medicine

## 2019-12-10 ENCOUNTER — Other Ambulatory Visit: Payer: Self-pay

## 2019-12-10 DIAGNOSIS — M8589 Other specified disorders of bone density and structure, multiple sites: Secondary | ICD-10-CM | POA: Diagnosis not present

## 2019-12-10 DIAGNOSIS — Z780 Asymptomatic menopausal state: Secondary | ICD-10-CM | POA: Diagnosis not present

## 2019-12-10 DIAGNOSIS — M8580 Other specified disorders of bone density and structure, unspecified site: Secondary | ICD-10-CM

## 2019-12-16 ENCOUNTER — Ambulatory Visit: Payer: PPO | Admitting: Obstetrics and Gynecology

## 2019-12-16 ENCOUNTER — Encounter: Payer: Self-pay | Admitting: Obstetrics and Gynecology

## 2019-12-16 ENCOUNTER — Ambulatory Visit (INDEPENDENT_AMBULATORY_CARE_PROVIDER_SITE_OTHER): Payer: PPO | Admitting: Obstetrics and Gynecology

## 2019-12-16 ENCOUNTER — Other Ambulatory Visit: Payer: Self-pay

## 2019-12-16 VITALS — BP 134/68 | HR 76 | Ht 61.0 in | Wt 127.0 lb

## 2019-12-16 DIAGNOSIS — M8580 Other specified disorders of bone density and structure, unspecified site: Secondary | ICD-10-CM | POA: Diagnosis not present

## 2019-12-16 DIAGNOSIS — N816 Rectocele: Secondary | ICD-10-CM

## 2019-12-16 DIAGNOSIS — Z01419 Encounter for gynecological examination (general) (routine) without abnormal findings: Secondary | ICD-10-CM | POA: Diagnosis not present

## 2019-12-16 DIAGNOSIS — K5900 Constipation, unspecified: Secondary | ICD-10-CM | POA: Diagnosis not present

## 2019-12-16 NOTE — Patient Instructions (Signed)
EXERCISE AND DIET:  We recommended that you start or continue a regular exercise program for good health. Regular exercise means any activity that makes your heart beat faster and makes you sweat.  We recommend exercising at least 30 minutes per day at least 3 days a week, preferably 4 or 5.  We also recommend a diet low in fat and sugar.  Inactivity, poor dietary choices and obesity can cause diabetes, heart attack, stroke, and kidney damage, among others.    ALCOHOL AND SMOKING:  Women should limit their alcohol intake to no more than 7 drinks/beers/glasses of wine (combined, not each!) per week. Moderation of alcohol intake to this level decreases your risk of breast cancer and liver damage. And of course, no recreational drugs are part of a healthy lifestyle.  And absolutely no smoking or even second hand smoke. Most people know smoking can cause heart and lung diseases, but did you know it also contributes to weakening of your bones? Aging of your skin?  Yellowing of your teeth and nails?  CALCIUM AND VITAMIN D:  Adequate intake of calcium and Vitamin D are recommended.  The recommendations for exact amounts of these supplements seem to change often, but generally speaking 1,200 mg of calcium (between diet and supplement) and 800 units of Vitamin D per day seems prudent. Certain women may benefit from higher intake of Vitamin D.  If you are among these women, your doctor will have told you during your visit.    PAP SMEARS:  Pap smears, to check for cervical cancer or precancers,  have traditionally been done yearly, although recent scientific advances have shown that most women can have pap smears less often.  However, every woman still should have a physical exam from her gynecologist every year. It will include a breast check, inspection of the vulva and vagina to check for abnormal growths or skin changes, a visual exam of the cervix, and then an exam to evaluate the size and shape of the uterus and  ovaries.  And after 76 years of age, a rectal exam is indicated to check for rectal cancers. We will also provide age appropriate advice regarding health maintenance, like when you should have certain vaccines, screening for sexually transmitted diseases, bone density testing, colonoscopy, mammograms, etc.   MAMMOGRAMS:  All women over 40 years old should have a yearly mammogram. Many facilities now offer a "3D" mammogram, which may cost around $50 extra out of pocket. If possible,  we recommend you accept the option to have the 3D mammogram performed.  It both reduces the number of women who will be called back for extra views which then turn out to be normal, and it is better than the routine mammogram at detecting truly abnormal areas.    COLON CANCER SCREENING: Now recommend starting at age 45. At this time colonoscopy is not covered for routine screening until 50. There are take home tests that can be done between 45-49.   COLONOSCOPY:  Colonoscopy to screen for colon cancer is recommended for all women at age 50.  We know, you hate the idea of the prep.  We agree, BUT, having colon cancer and not knowing it is worse!!  Colon cancer so often starts as a polyp that can be seen and removed at colonscopy, which can quite literally save your life!  And if your first colonoscopy is normal and you have no family history of colon cancer, most women don't have to have it again for   10 years.  Once every ten years, you can do something that may end up saving your life, right?  We will be happy to help you get it scheduled when you are ready.  Be sure to check your insurance coverage so you understand how much it will cost.  It may be covered as a preventative service at no cost, but you should check your particular policy.      Breast Self-Awareness Breast self-awareness means being familiar with how your breasts look and feel. It involves checking your breasts regularly and reporting any changes to your  health care provider. Practicing breast self-awareness is important. A change in your breasts can be a sign of a serious medical problem. Being familiar with how your breasts look and feel allows you to find any problems early, when treatment is more likely to be successful. All women should practice breast self-awareness, including women who have had breast implants. How to do a breast self-exam One way to learn what is normal for your breasts and whether your breasts are changing is to do a breast self-exam. To do a breast self-exam: Look for Changes  1. Remove all the clothing above your waist. 2. Stand in front of a mirror in a room with good lighting. 3. Put your hands on your hips. 4. Push your hands firmly downward. 5. Compare your breasts in the mirror. Look for differences between them (asymmetry), such as: ? Differences in shape. ? Differences in size. ? Puckers, dips, and bumps in one breast and not the other. 6. Look at each breast for changes in your skin, such as: ? Redness. ? Scaly areas. 7. Look for changes in your nipples, such as: ? Discharge. ? Bleeding. ? Dimpling. ? Redness. ? A change in position. Feel for Changes Carefully feel your breasts for lumps and changes. It is best to do this while lying on your back on the floor and again while sitting or standing in the shower or tub with soapy water on your skin. Feel each breast in the following way:  Place the arm on the side of the breast you are examining above your head.  Feel your breast with the other hand.  Start in the nipple area and make  inch (2 cm) overlapping circles to feel your breast. Use the pads of your three middle fingers to do this. Apply light pressure, then medium pressure, then firm pressure. The light pressure will allow you to feel the tissue closest to the skin. The medium pressure will allow you to feel the tissue that is a little deeper. The firm pressure will allow you to feel the tissue  close to the ribs.  Continue the overlapping circles, moving downward over the breast until you feel your ribs below your breast.  Move one finger-width toward the center of the body. Continue to use the  inch (2 cm) overlapping circles to feel your breast as you move slowly up toward your collarbone.  Continue the up and down exam using all three pressures until you reach your armpit.  Write Down What You Find  Write down what is normal for each breast and any changes that you find. Keep a written record with breast changes or normal findings for each breast. By writing this information down, you do not need to depend only on memory for size, tenderness, or location. Write down where you are in your menstrual cycle, if you are still menstruating. If you are having trouble noticing differences   in your breasts, do not get discouraged. With time you will become more familiar with the variations in your breasts and more comfortable with the exam. How often should I examine my breasts? Examine your breasts every month. If you are breastfeeding, the best time to examine your breasts is after a feeding or after using a breast pump. If you menstruate, the best time to examine your breasts is 5-7 days after your period is over. During your period, your breasts are lumpier, and it may be more difficult to notice changes. When should I see my health care provider? See your health care provider if you notice:  A change in shape or size of your breasts or nipples.  A change in the skin of your breast or nipples, such as a reddened or scaly area.  Unusual discharge from your nipples.  A lump or thick area that was not there before.  Pain in your breasts.  Anything that concerns you.  Try metamucil for constipation.   About Constipation  Constipation Overview Constipation is the most common gastrointestinal complaint -- about 4 million Americans experience constipation and make 2.5 million  physician visits a year to get help for the problem.  Constipation can occur when the colon absorbs too much water, the colon's muscle contraction is slow or sluggish, and/or there is delayed transit time through the colon.  The result is stool that is hard and dry.  Indicators of constipation include straining during bowel movements greater than 25% of the time, having fewer than three bowel movements per week, and/or the feeling of incomplete evacuation.  There are established guidelines ([place] II ) for defining constipation. A person needs to have two or more of the following symptoms for at least 12 weeks (not necessarily consecutive) in the preceding 12 months: . Straining in  greater than 25% of bowel movements . Lumpy or hard stools in greater than 25% of bowel movements . Sensation of incomplete emptying in greater than 25% of bowel movements . Sensation of anorectal obstruction/blockade in greater than 25% of bowel movements . Manual maneuvers to help empty greater than 25% of bowel movements (e.g., digital evacuation, support of the pelvic floor)  . Less than  3 bowel movements/week . Loose stools are not present, and criteria for irritable bowel syndrome are insufficient  Common Causes of Constipation . Lack of fiber in your diet . Lack of physical activity . Medications, including iron and calcium supplements  . Dairy intake . Dehydration . Abuse of laxatives  Travel  Irritable Bowel Syndrome  Pregnancy  Luteal phase of menstruation (after ovulation and before menses)  Colorectal problems  Intestinal Dysfunction  Treating Constipation  There are several ways of treating constipation, including changes to diet and exercise, use of laxatives, adjustments to the pelvic floor, and scheduled toileting.  These treatments include: . increasing fiber and fluids in the diet  . increasing physical activity . learning muscle coordination   learning proper toileting techniques  and toileting modifications   designing and sticking  to a toileting schedule     2007, [employer] Doc.22

## 2019-12-16 NOTE — Progress Notes (Signed)
74 y.o. G4P3003 Married White or Caucasian Not Hispanic or Latino female here for annual exam.  No vaginal bleeding, not sexually active.   No bowel or bladder c/o. She does have a hemorrhoid, it does bother her, no pain. The bulge comes and goes. She has had some constipation, taking stool softener. Can bleed with strains.   H/O asymptomatic rectocele. Occasionally needs to reduce her rectocele to defecate at times.   She has chronic neck pain (fell down the stairs at 15, has had 3 surgeries). Able to function. She feels so tired from the pain. Just sore all the time, not depressed.   H/O osteopenia with elevated risk of fracture, tried Fosamax, didn't like it.     Patient's last menstrual period was 08/15/1988 (exact date).          Sexually active: No.  The current method of family planning is post menopausal status.    Exercising: Yes.    gym work outs  Smoker:  no  Health Maintenance: Pap:  04/15/14 WNL History of abnormal Pap:  no MMG:  12/07/17 density B Bi-rads 1 neg  BMD:   10/12/21osteopenic, T score -2.4. FRAX 15.1%/4.6% Colonoscopy: 07/17/19 no repeat due to age. TDaP:  12/26/17 Gardasil: NA   reports that she has quit smoking. Her smoking use included cigarettes. She quit after 1.00 year of use. She has never used smokeless tobacco. She reports current alcohol use. She reports that she does not use drugs. She has 3 kids, and 4 grandchildren  Past Medical History:  Diagnosis Date  . Anxiety    sees [facility], [place])  . Asthma   . Colon polyps 08/2014   tubular adenoma and hyperplastic polyps (Dr. Hilarie Fredrickson)  . DDD (degenerative disc disease), cervical dr Patrice Paradise   s/p surgery, ongoing pain  . Depression    h/o hospitalization with ECT  . Diverticulosis   . Esophagitis   . Hiatal hernia   . History of insomnia   . Hypertension   . Internal hemorrhoids   . Neck pain   . Osteopenia stopped fosamax ~2006  . Post-operative nausea and vomiting   . Vertigo    . Vitamin D deficiency borderline(28)-2010   treated by her GYN    Past Surgical History:  Procedure Laterality Date  . CATARACT EXTRACTION, BILATERAL Bilateral Sept/October 2019  . HEMORRHOID SURGERY  2006, 12/2014  . KNEE ARTHROSCOPY Right   . LASIK     Dr. Lucita Ferrara  . NASAL SEPTOPLASTY W/ TURBINOPLASTY  1986  . NECK SURGERY  02/2010([facility])1/03,2005([place])  . REDUCTION MAMMAPLASTY Bilateral 07/24/2008  . TONSILLECTOMY    . TOTAL HIP ARTHROPLASTY Left 08/2007      . TOTAL SHOULDER ARTHROPLASTY Left 08/30/2018   Procedure: TOTAL SHOULDER ARTHROPLASTY;  Surgeon: Justice Britain, MD;  Location: [facility];  Service: Orthopedics;  Laterality: Left;  115min, move scope to 12:30pm    Current Outpatient Medications  Medication Sig Dispense Refill  . amLODipine (NORVASC) 5 MG tablet TAKE 1 TABLET BY MOUTH EVERY DAY 90 tablet 1  . B Complex-C (B-COMPLEX WITH VITAMIN C) tablet Take 1 tablet by mouth 2 (two) times a day.    . budesonide-formoterol (SYMBICORT) 160-4.5 MCG/ACT inhaler Inhale 2 puffs into the lungs daily as needed (shortness of breath).     . Calcium Carbonate-Vit D-Min (CALCIUM 1200 PO) Take 2,400 mg by mouth daily.     . famotidine (PEPCID) 40 MG tablet Take 1 tablet (40 mg total) by mouth 2 (two)  times daily. Pharmacy-please d/c rx for 30 day supply 180 tablet 0  . HYDROcodone-acetaminophen (NORCO/VICODIN) 5-325 MG tablet Take 0.5 tablets by mouth every 6 (six) hours as needed for moderate pain.    Marland Kitchen lamoTRIgine (LAMICTAL) 150 MG tablet Take 150 mg by mouth at bedtime.      . Liniments (SALONPAS PAIN RELIEF PATCH EX) Place 1 patch onto the skin daily as needed (pain).    . Multiple Vitamins-Minerals (HAIR SKIN AND NAILS FORMULA PO) Take 1 tablet by mouth daily.     Marland Kitchen PROAIR HFA 108 (90 Base) MCG/ACT inhaler Inhale 2 puffs into the lungs every 4 (four) hours as needed for wheezing or shortness of breath. 1 Inhaler 1  . VITAMIN D PO Take 5,000 Units by mouth daily.     Marland Kitchen zolpidem  (AMBIEN) 10 MG tablet Take 2.5 mg by mouth at bedtime.      No current facility-administered medications for this visit.    Family History  Problem Relation Age of Onset  . Arthritis Mother   . Depression Mother   . Pernicious anemia Mother   . Heart disease Father 11  . Heart attack Father   . Depression Brother   . Diabetes Paternal Uncle   . Breast cancer Cousin        paternal  . Colon cancer Neg Hx   . Rectal cancer Neg Hx   . Stomach cancer Neg Hx     Review of Systems  All other systems reviewed and are negative.   Exam:   BP 134/68   Pulse 76   Ht 5\' 1"  (1.549 m)   Wt 127 lb (57.6 kg)   LMP 08/15/1988 (Exact Date)   SpO2 98%   BMI 24.00 kg/m   Weight change: @WEIGHTCHANGE @ Height:   Height: 5\' 1"  (154.9 cm)  Ht Readings from Last 3 Encounters:  12/16/19 5\' 1"  (1.549 m)  12/02/19 5\' 1"  (1.549 m)  09/30/19 5\' 1"  (1.549 m)    General appearance: alert, cooperative and appears stated age Head: Normocephalic, without obvious abnormality, atraumatic Neck: no adenopathy, supple, symmetrical, trachea midline and thyroid normal to inspection and palpation Lungs: clear to auscultation bilaterally Cardiovascular: regular rate and rhythm Breasts: normal appearance, no masses or tenderness Abdomen: soft, non-tender; non distended,  no masses,  no organomegaly Extremities: extremities normal, atraumatic, no cyanosis or edema Skin: Skin color, texture, turgor normal. No rashes or lesions Lymph nodes: Cervical, supraclavicular, and axillary nodes normal. No abnormal inguinal nodes palpated Neurologic: Grossly normal   Pelvic: External genitalia:  no lesions              Urethra:  normal appearing urethra with no masses, tenderness or lesions              Bartholins and Skenes: normal                 Vagina: atrophic appearing vagina. Small grade 2 rectocele.               Cervix: no lesions               Bimanual Exam:  Uterus:  normal size, contour, position,  consistency, mobility, non-tender              Adnexa: no mass, fullness, tenderness               Rectovaginal: Confirms               Anus:  normal  sphincter tone, no lesions  Shanon Petty chaperoned for the exam.  A:  Well Woman with normal exam  H/O osteopenia with elevated risk of fracture  Rectocele  Constipation  P:   Discussed fruit, fiber, fluids  Try metamucil  Mammogram overdue, she will schedule  DEXA just done with her primary, discussed that her risk of fracture would warrant treatment. She will f/u with her primary to further discuss. Handout from up to date on osteoporosis given to the patient.

## 2019-12-17 NOTE — Progress Notes (Addendum)
Chief Complaint  Patient presents with  . Follow-up    here to go over DEXA results. Does mention that she has been extremely fatigued and wanted to have her iron checked.     Patient presents to discuss recent DEXA scan, and treatments for osteopenia. She recently saw Dr. Talbert Nan for routine GYN exam, who agrees with recommendation for treatment.  On review of chart, I saw that she had been started on Fosamax in 2006. She reports this was stopped shortly after by GYN. Pt recalls it was due to "being cancer causing". Scared her so she stopped it.  She was a little fuzzy on these details, just remembered that she didn't take it very long.  Saw Dr. Hilarie Fredrickson earlier this month for EGD, which was to f/u on esophagitis noted on 06/2019 endoscopy.  Esophagitis had improved (still present, much better). She is taking famotidine 67m twice daily (had dizziness from PPI). She was noted to have hiatal hernia at time of EGD. She gets some burning and indigestion, related to what she eats, but [facility]  She can now have wine without it burning her chest.   She is complaining of fatigue x 2 months. She wakes up tired.  She sleeps well overall.  She knows she snores. She has discussed with her husband (when they were away together; they don't sleep in the same room) and denies any apnea. She started exercising harder at [employer].  Doing more aerobics and weights, previously had just done yoga. She is trying to drink more water. Urine is yellow, she thinks due to her vitamins.  She eats a boiled egg for breakfast. Always eats before exercising (differs in food, "a good breakfast" per pt) She gets tired at 2pm, is okay earlier in the day. Wonders if it is just "getting old" She thinks it could be from her chronic pain (neck)--has appt with Dr. CSaintclair Halstednext week. Neck not worse from her new exercise routine.  Denies depression. Has been stable on her lamictal with no dose  changes. Fatigue is worse since she last had labs done in July. She thinks her pain wears her out. Takes 1/2 hydrocodone 3-4x/week.   Tired even on the days she doesn't take it. She feels sore when she wakes up in the morning (body feels sore)--"I know it isn't my mattress" "I think it is my neck pain, there is nothing I can do about it", was told additional surgery wasn't an option.  c-met, CBC, D done in July, normal.  PMH, PSH, SH reviewed  Outpatient Encounter Medications as of 12/18/2019  Medication Sig Note  . amLODipine (NORVASC) 5 MG tablet TAKE 1 TABLET BY MOUTH EVERY DAY   . B Complex-C (B-COMPLEX WITH VITAMIN C) tablet Take 1 tablet by mouth 2 (two) times a day. 08/27/2018: Takes B-complex 100 once daily  . Calcium Carbonate-Vit D-Min (CALCIUM 1200 PO) Take 2,400 mg by mouth daily.  02/27/2019: Unsure of dose, takes 2 together.  . famotidine (PEPCID) 40 MG tablet Take 1 tablet (40 mg total) by mouth 2 (two) times daily. Pharmacy-please d/c rx for 30 day supply   . HYDROcodone-acetaminophen (NORCO/VICODIN) 5-325 MG tablet Take 0.5 tablets by mouth every 6 (six) hours as needed for moderate pain. 12/18/2019: Taking 1/2 tablet 3-4x/week lately  . lamoTRIgine (LAMICTAL) 150 MG tablet Take 150 mg by mouth at bedtime.     . Liniments (SALONPAS PAIN RELIEF PATCH EX) Place 1 patch onto the  skin daily as needed (pain).   . Multiple Vitamins-Minerals (HAIR SKIN AND NAILS FORMULA PO) Take 1 tablet by mouth daily.    Marland Kitchen VITAMIN D PO Take 5,000 Units by mouth daily.    Marland Kitchen zolpidem (AMBIEN) 10 MG tablet Take 2.5 mg by mouth at bedtime.    . budesonide-formoterol (SYMBICORT) 160-4.5 MCG/ACT inhaler Inhale 2 puffs into the lungs daily as needed (shortness of breath).  (Patient not taking: Reported on 12/18/2019) 02/27/2019: Uses it only prn, if she notices a "wheezy cough"; works better than albuterol for prn use  . PROAIR HFA 108 (90 Base) MCG/ACT inhaler Inhale 2 puffs into the lungs every 4  (four) hours as needed for wheezing or shortness of breath. (Patient not taking: Reported on 12/18/2019) 06/25/2019: On hand  . [DISCONTINUED] meloxicam (MOBIC) 15 MG tablet Take 15 mg by mouth daily.    No facility-administered encounter medications on file as of 12/18/2019.   Allergies  Allergen Reactions  . Cymbalta [Duloxetine Hcl]     Abdominal pain  . Nitrofurantoin [Macrodantin]   . Sulfamethoxazole Hives  . Cefdinir Rash    ROS: no fever, chills, URI symptoms, cough, shortness of breath. Denies chest pain, dysphagia, jaw pain, hip pain. Some indigestion per HPI. +fatigue per HPI.  See HPI.  PHYSICAL EXAM:  BP 130/80   Pulse 68   Ht _0  (1.549 m)   Wt 128 lb 12.8 oz (58.4 kg)   LMP 08/15/1988 (Exact Date)   BMI 24.34 kg/m   Wt Readings from Last 3 Encounters:  12/16/19 127 lb (57.6 kg)  12/02/19 128 lb (58.1 kg)  09/30/19 128 lb 4 oz (58.2 kg)   Well-appearing, pleasant female, in no distress. She is alert, oriented, and in good spirits.  The following information from DEXA report done 12/10/19 was reviewed with the patient:  ASSESSMENT: The BMD measured at Forearm Radius 33% is 0.678 g/cm2 with a T-score of -2.4. This patient is considered osteopenic according to [facility]) criteria.  The scan quality is good. Lumbar spine was not utilized due to exclusion on prior exam. Left femur could not be utilized due to surgical hardware.  Site Region Measured Date Measured Age YA BMD Significant CHANGE T-score  Left Forearm Radius 33% 12/10/2019 76.3 -2.4 0.678 g/cm2 * Left Forearm Radius 33% 12/06/2017 74.3 -1.7 0.736 g/cm2  Right Femur Neck 12/10/2019 76.3 -2.2 0.729 g/cm2 Right Femur Neck 12/06/2017 74.3 -2.1 0.744 g/cm2  Right Femur Total 12/10/2019 76.3 -0.9 0.892 g/cm2 Right Femur Total 12/06/2017 74.3 -1.0 0.883 g/cm2  RECOMMENDATION: 1. All patients should optimize calcium and vitamin D intake. 2. Consider FDA approved  medical therapies in postmenopausal women and men aged 58 years and older, based on the following: a. A hip or vertebral (clinical or morphometric) fracture b. T- score < or = -2.5 at the femoral neck or spine after appropriate evaluation to exclude secondary causes c. Low bone mass (T-score between -1.0 and -2.5 at the femoral neck or spine) and a 10 year probability of a hip fracture > or = 3% or a 10 year probability of a major osteoporosis-related fracture > or = 20% based on the [place]-adapted WHO algorithm d. Clinician judgment and/or patient preferences may indicate treatment for people with 10-year fracture probabilities above or below these levels  FRAX* 10-year Probability of Fracture Based on femoral neck BMD: Femur (Right)  Major Osteoporotic Fracture: 15.1% Hip Fracture:  4.6%  ASSESSMENT: The probability of a major osteoporotic fracture is 15.1% within the next ten years. The probability of a hip fracture is 4.6% within the next ten years.  ASSESSMENT/PLAN:  Osteopenia with high risk of fracture - DEXA and FRAX scores reviewed in detail. Counseled re: bisphosphonate options and potential risks/SE. Will get okay from GI and try monthly med  Fatigue, unspecified type - sounds more chronic, related to chronic pain, poss depression. Recheck labs to appease pt. Exercising more--discussed hydration, nutrition. Consider sleep study - Plan: CBC with Differential/Platelet, Comprehensive metabolic panel  Gastroesophageal reflux disease with esophagitis without hemorrhage - esophagitis improving per recent EGD. Doing well on BID Pepcid.   Discussed bisphonates. At risk for esophageal side effects given her recent esophagitis. Will send message to Dr. Hilarie Fredrickson to ensure that he is okay with a trial of monthly bisphosphonate (at least for 1-2 months to try orally, and if tolerating, can switch to IV infusion if contributing to any GI problems.   [facility] [address] Actonel  is preferred over [place] (per computer formulary). Can consider changing to IV infusion if cannot tolerate oral due to GI side effects (but otherwise tolerates med).  FTF 35 min, more than 1/2 spent counseling. Additional time spent in chart review and documentation.   12/25/19 note back from Dr. Princella Pellegrini that trial of monthly therapy with Actonel is fine.  She should continue with the BID Pepcid and if she does get GI intolerance or worsening GERD symptoms, try IV bisphos therapy.

## 2019-12-17 NOTE — Patient Instructions (Addendum)
Today we discussed use of Actonel once a month (with a FULL glass of water, on an empty stomach, waiting 30 minutes before eating or laying down) to treat the osteopenia (thinning of the bones), which has you at increased risk for hip fracture.  Given your recent esophageal problems, I'll send a quick note to Dr. Hilarie Fredrickson to get his opinion.  As for your fatigue--we are checking labs today.  If they are normal--be sure to stay well hydrated, get adequate nutrition before/after your workouts.  If related to chronic pain, sometimes depression from the pain can be a factor--trying to get the pain controlled and keeping the moods controlled can help with energy. Be sure that you get enough sleep. If you wake up feeling unrefreshed, it could be that you have sleep apnea.  You mentioned that your husband denied hearing any pauses, but if your fatigue (especially if not feeling rested when you wake up) persists, we should consider doing a sleep study.

## 2019-12-18 ENCOUNTER — Other Ambulatory Visit: Payer: Self-pay

## 2019-12-18 ENCOUNTER — Ambulatory Visit (INDEPENDENT_AMBULATORY_CARE_PROVIDER_SITE_OTHER): Payer: PPO | Admitting: Family Medicine

## 2019-12-18 ENCOUNTER — Encounter: Payer: Self-pay | Admitting: Family Medicine

## 2019-12-18 VITALS — BP 130/80 | HR 68 | Ht 61.0 in | Wt 128.8 lb

## 2019-12-18 DIAGNOSIS — M8580 Other specified disorders of bone density and structure, unspecified site: Secondary | ICD-10-CM | POA: Diagnosis not present

## 2019-12-18 DIAGNOSIS — R5383 Other fatigue: Secondary | ICD-10-CM

## 2019-12-18 DIAGNOSIS — K2100 Gastro-esophageal reflux disease with esophagitis, without bleeding: Secondary | ICD-10-CM

## 2019-12-19 LAB — COMPREHENSIVE METABOLIC PANEL
ALT: 20 IU/L (ref 0–32)
AST: 19 IU/L (ref 0–40)
Albumin/Globulin Ratio: 2.4 — ABNORMAL HIGH (ref 1.2–2.2)
Albumin: 4.5 g/dL (ref 3.7–4.7)
Alkaline Phosphatase: 116 IU/L (ref 44–121)
BUN/Creatinine Ratio: 29 — ABNORMAL HIGH (ref 12–28)
BUN: 17 mg/dL (ref 8–27)
Bilirubin Total: <0.2 mg/dL (ref 0.0–1.2)
CO2: 27 mmol/L (ref 20–29)
Calcium: 9.9 mg/dL (ref 8.7–10.3)
Chloride: 99 mmol/L (ref 96–106)
Creatinine, Ser: 0.58 mg/dL (ref 0.57–1.00)
GFR calc Af Amer: 104 mL/min/{1.73_m2} (ref >59)
GFR calc non Af Amer: 90 mL/min/{1.73_m2} (ref >59)
Globulin, Total: 1.9 g/dL (ref 1.5–4.5)
Glucose: 95 mg/dL (ref 65–99)
Potassium: 3.8 mmol/L (ref 3.5–5.2)
Sodium: 136 mmol/L (ref 134–144)
Total Protein: 6.4 g/dL (ref 6.0–8.5)

## 2019-12-19 LAB — CBC WITH DIFFERENTIAL/PLATELET
Basophils Absolute: 0 10*3/uL (ref 0.0–0.2)
Basos: 1 %
EOS (ABSOLUTE): 0.1 10*3/uL (ref 0.0–0.4)
Eos: 2 %
Hematocrit: 36.9 % (ref 34.0–46.6)
Hemoglobin: 12.3 g/dL (ref 11.1–15.9)
Immature Grans (Abs): 0 10*3/uL (ref 0.0–0.1)
Immature Granulocytes: 0 %
Lymphocytes Absolute: 2.1 10*3/uL (ref 0.7–3.1)
Lymphs: 33 %
MCH: 28.7 pg (ref 26.6–33.0)
MCHC: 33.3 g/dL (ref 31.5–35.7)
MCV: 86 fL (ref 79–97)
Monocytes Absolute: 0.6 10*3/uL (ref 0.1–0.9)
Monocytes: 10 %
Neutrophils Absolute: 3.6 10*3/uL (ref 1.4–7.0)
Neutrophils: 54 %
Platelets: 314 10*3/uL (ref 150–450)
RBC: 4.29 x10E6/uL (ref 3.77–5.28)
RDW: 13 % (ref 11.7–15.4)
WBC: 6.6 10*3/uL (ref 3.4–10.8)

## 2019-12-24 DIAGNOSIS — M542 Cervicalgia: Secondary | ICD-10-CM | POA: Diagnosis not present

## 2019-12-24 DIAGNOSIS — R030 Elevated blood-pressure reading, without diagnosis of hypertension: Secondary | ICD-10-CM | POA: Diagnosis not present

## 2019-12-25 MED ORDER — RISEDRONATE SODIUM 150 MG PO TABS: 150.0000 mg | ORAL_TABLET | ORAL | 3 refills | Status: DC

## 2019-12-25 NOTE — Addendum Note (Signed)
Addended by: [name] on: 12/25/2019 09:37 AM   Modules accepted: Orders

## 2020-01-13 DIAGNOSIS — M19079 Primary osteoarthritis, unspecified ankle and foot: Secondary | ICD-10-CM | POA: Insufficient documentation

## 2020-01-13 DIAGNOSIS — M19072 Primary osteoarthritis, left ankle and foot: Secondary | ICD-10-CM | POA: Diagnosis not present

## 2020-01-13 DIAGNOSIS — M79672 Pain in left foot: Secondary | ICD-10-CM | POA: Diagnosis not present

## 2020-01-31 ENCOUNTER — Telehealth (INDEPENDENT_AMBULATORY_CARE_PROVIDER_SITE_OTHER): Payer: PPO | Admitting: Medical

## 2020-01-31 ENCOUNTER — Other Ambulatory Visit: Payer: Self-pay | Admitting: Family Medicine

## 2020-01-31 ENCOUNTER — Encounter: Payer: Self-pay | Admitting: Medical

## 2020-01-31 VITALS — Ht 61.5 in | Wt 124.0 lb

## 2020-01-31 DIAGNOSIS — J988 Other specified respiratory disorders: Secondary | ICD-10-CM

## 2020-01-31 DIAGNOSIS — J4520 Mild intermittent asthma, uncomplicated: Secondary | ICD-10-CM

## 2020-01-31 DIAGNOSIS — R059 Cough, unspecified: Secondary | ICD-10-CM

## 2020-01-31 DIAGNOSIS — Z1231 Encounter for screening mammogram for malignant neoplasm of breast: Secondary | ICD-10-CM

## 2020-01-31 MED ORDER — PROAIR HFA 108 (90 BASE) MCG/ACT IN AERS: 2.0000 | INHALATION_SPRAY | RESPIRATORY_TRACT | 0 refills | Status: DC | PRN

## 2020-01-31 MED ORDER — FLOVENT HFA 110 MCG/ACT IN AERO: 2.0000 | INHALATION_SPRAY | Freq: Two times a day (BID) | RESPIRATORY_TRACT | 2 refills | Status: DC

## 2020-01-31 MED ORDER — AZITHROMYCIN 250 MG PO TABS: ORAL_TABLET | ORAL | 0 refills | Status: DC

## 2020-01-31 NOTE — Progress Notes (Signed)
Subjective:     Patient ID: [name], female   DOB: Jul 17, 1945, 74 y.o.   MRN: [id number]  This visit type was conducted due to national recommendations for restrictions regarding the COVID-19 Pandemic (e.g. social distancing) in an effort to limit this patient's exposure and mitigate transmission in our community.  Due to their co-morbid illnesses, this patient is at least at moderate risk for complications without adequate follow up.  This format is felt to be most appropriate for this patient at this time.    Documentation for virtual audio and video telecommunications through [facility] encounter:  The patient was located at home. The provider was located in the office. The patient did consent to this visit and is aware of possible charges through their insurance for this visit.  The other persons participating in this telemedicine service were none. Time spent on call was 20 minutes and in review of previous records 20 minutes total.  This virtual service is not related to other E/M service within previous 7 days.   HPI Chief Complaint  Patient presents with  . Cough    with chest pain   . Fatigue  . Ear Pain    right but doesn't hurt today    Virtual consult.  She notes 2 weeks ago started with crusting in nose, large amounts of dry mucous, but then this past week came down with a cold.  She notes recently having runny nose, watery eyes, cold symptoms.  Then in last 3 days turned into bronchitis.   Sees Dr. Harold Huerta pulmonology in the past, but they required covid test to come in.  She declined.    Has chest congestion, cough, fatigue mildly, ear pressure.  No headache, no sore throat.  No nausea, no vomiting, no body aches, no chills.   No specific sick contacts.    No wheezing or SOB.  Has underlying history of asthma.  Using zycam, mucinex, salt water gargles, vicks.  using old Flovent, but that is almost empty.  No current albuterol.    No other aggravating or  relieving factors. No other complaint.   Past Medical History:  Diagnosis Date  . Anxiety    sees [name] [name], [place])  . Asthma   . Colon polyps 08/2014   tubular adenoma and hyperplastic polyps (Dr. Hilarie Huerta)  . DDD (degenerative disc disease), cervical dr [name]   s/p surgery, ongoing pain  . Depression    h/o hospitalization with ECT  . Diverticulosis   . Esophagitis   . Hiatal hernia   . History of insomnia   . Hypertension   . Internal hemorrhoids   . Neck pain   . Osteopenia stopped fosamax ~2006  . Post-operative nausea and vomiting   . Vertigo   . Vitamin D deficiency borderline(28)-2010   treated by her GYN    Review of Systems As in subjective    Objective:   Physical Exam Due to coronavirus pandemic stay at home measures, patient visit was virtual and they were not examined in person.    Ht 5' 1.5" (1.562 m)   Wt 124 lb (56.2 kg)   LMP 08/15/1988 (Exact Date)   BMI 23.05 kg/m       Assessment:     Encounter Diagnoses  Name Primary?  . Cough Yes  . Respiratory tract infection   . Mild intermittent asthma, unspecified whether complicated        Plan:     Discussed symptoms, concerns, and possible  causes.  Discussed limitations of virtual consult.   Offered covid testing to further eval.  She declines.   Given time frame of symptoms and current worsening, begin zpak, c/t supportive measures she is doing.   Add back albuterol for the next week given the symptoms.  C/t Flovent.  If in the next week not improving, worse, or new symptoms , get re-evaluated  [place]ah was seen today for cough, fatigue and ear pain.  Diagnoses and all orders for this visit:  Cough  Respiratory tract infection  Mild intermittent asthma, unspecified whether complicated  Other orders -     PROAIR HFA 108 (90 Base) MCG/ACT inhaler; Inhale 2 puffs into the lungs every 4 (four) hours as needed for wheezing or shortness of breath. -     azithromycin (ZITHROMAX) 250 MG  tablet; 2 tablets day 1, then 1 tablet days 2-4 -     fluticasone (FLOVENT HFA) 110 MCG/ACT inhaler; Inhale 2 puffs into the lungs in the morning and at bedtime.  f/u prn

## 2020-02-03 DIAGNOSIS — F3342 Major depressive disorder, recurrent, in full remission: Secondary | ICD-10-CM | POA: Diagnosis not present

## 2020-02-04 ENCOUNTER — Other Ambulatory Visit: Payer: Self-pay | Admitting: Medical

## 2020-02-04 ENCOUNTER — Telehealth: Payer: Self-pay | Admitting: Medical

## 2020-02-04 MED ORDER — PREDNISONE 10 MG PO TABS: ORAL_TABLET | ORAL | 0 refills | Status: DC

## 2020-02-04 MED ORDER — AMOXICILLIN 875 MG PO TABS: 875.0000 mg | ORAL_TABLET | Freq: Two times a day (BID) | ORAL | 0 refills | Status: DC

## 2020-02-04 NOTE — Telephone Encounter (Signed)
Did she feel any improvement with the treatment last visit including her albuterol?  If she still using her albuterol at least 2 or 3 times a day for cough or tightness in the chest?  I just sent amoxicillin and prednisone oral Dosepak.  Antibiotic amoxicillin is different than the Z-Pak we used recently.  The steroid helps reduce inflammation and tightness in the lungs  Continue to rest and hydrate well.    Lets see if this clears things up

## 2020-02-04 NOTE — Telephone Encounter (Signed)
Spoke to patient and she stated she was feeling better with the treatment and albuterol. She is still using the Albuterol for cough and tightness. She thanks for for being so nice and helpful and for sending her medication.

## 2020-02-04 NOTE — Telephone Encounter (Signed)
Pt called and states that she had an appt with Audelia Acton last week. She states that she was given a Zpack. She states she finished that this AM. She states that she is better but feels like it has settled in her chest. She would like something else sent in for her. She usually gets bronchitis once a year and fears that its going into that. [facility] [address] and can be reached at [phone].

## 2020-02-18 ENCOUNTER — Telehealth: Payer: Self-pay | Admitting: Family Medicine

## 2020-02-18 ENCOUNTER — Other Ambulatory Visit: Payer: Self-pay | Admitting: Medical

## 2020-02-18 MED ORDER — AZITHROMYCIN 250 MG PO TABS: ORAL_TABLET | ORAL | 0 refills | Status: AC

## 2020-02-18 NOTE — Telephone Encounter (Signed)
PT called and said she was feeling better a few days after you prescribed her antibiotic. She took her last one Sunday and still has some congestion in her chest. She wanted to see if she can get another round of antibiotics and if she is still not better after christmas she will come in for appt. [facility] [address].

## 2020-02-18 NOTE — Telephone Encounter (Signed)
I sent a round of Z-Pak as an alternate.  Follow-up if not improving in the next week

## 2020-02-18 NOTE — Telephone Encounter (Signed)
Pt advised.

## 2020-03-06 ENCOUNTER — Other Ambulatory Visit: Payer: Self-pay | Admitting: Internal Medicine

## 2020-03-06 ENCOUNTER — Other Ambulatory Visit: Payer: Self-pay | Admitting: Family Medicine

## 2020-03-06 DIAGNOSIS — K219 Gastro-esophageal reflux disease without esophagitis: Secondary | ICD-10-CM

## 2020-03-06 DIAGNOSIS — K209 Esophagitis, unspecified without bleeding: Secondary | ICD-10-CM

## 2020-03-06 DIAGNOSIS — I10 Essential (primary) hypertension: Secondary | ICD-10-CM

## 2020-03-13 ENCOUNTER — Ambulatory Visit: Payer: PPO

## 2020-03-30 ENCOUNTER — Ambulatory Visit (INDEPENDENT_AMBULATORY_CARE_PROVIDER_SITE_OTHER): Payer: PPO | Admitting: Family Medicine

## 2020-03-30 ENCOUNTER — Other Ambulatory Visit: Payer: Self-pay

## 2020-03-30 ENCOUNTER — Encounter: Payer: Self-pay | Admitting: Family Medicine

## 2020-03-30 VITALS — BP 122/80 | HR 80 | Temp 98.0°F | Wt 130.6 lb

## 2020-03-30 DIAGNOSIS — R300 Dysuria: Secondary | ICD-10-CM

## 2020-03-30 DIAGNOSIS — R059 Cough, unspecified: Secondary | ICD-10-CM

## 2020-03-30 DIAGNOSIS — N909 Noninflammatory disorder of vulva and perineum, unspecified: Secondary | ICD-10-CM | POA: Diagnosis not present

## 2020-03-30 DIAGNOSIS — H9193 Unspecified hearing loss, bilateral: Secondary | ICD-10-CM

## 2020-03-30 LAB — POCT URINALYSIS DIP (CLINITEK)
Bilirubin, UA: NEGATIVE
Glucose, UA: NEGATIVE mg/dL
Ketones, POC UA: NEGATIVE mg/dL
Nitrite, UA: NEGATIVE
Spec Grav, UA: 1.015 (ref 1.010–1.025)
Urobilinogen, UA: 0.2 E.U./dL
pH, UA: 6.5 (ref 5.0–8.0)

## 2020-03-30 MED ORDER — BENZONATATE 200 MG PO CAPS: 200.0000 mg | ORAL_CAPSULE | Freq: Three times a day (TID) | ORAL | 0 refills | Status: DC | PRN

## 2020-03-30 NOTE — Patient Instructions (Signed)
  Stay well hydrated (drink plenty of water). You may continue to use the Vagisil for irritation as needed. You want to use zinc formulation such as Desitin (diaper cream) to use a protective barrier.  Put this on the external genital area to protect it from the urine.  I think the urine is touch the irritated skin and causing pain. I will send the urine to rule out infection, but am not convinced you have a urinary infection. Let us know if you develop more problems with urinary frequency, urgency, lower abdominal pain, blood in the urine, persistent odor.  Your lungs are clear. Try using the benzonatate as needed for cough. You do not have an infection that needs an antibiotic, and you are not wheezing. If your cough persists or worsens, you should follow-up with Dr. Harold Hedge.  Your ears were clear.  I suspect you likely have some mild congestion that is affecting the ears, and should continue to gradually improve.  There is no infection or wax build-up.

## 2020-03-30 NOTE — Progress Notes (Signed)
Chief Complaint  Patient presents with  . acute    Possible yeast infection, itching about for a week or so. urine has odor, now has burning with urination.     She had COVID 3 weeks ago (symptoms started on 1/4, with + test on 1/6).  She was sick x 2 weeks (fatigue, cough). She had negative COVID test on 1/22.  She complains of decreased hearing (since she had COVID)--wants her ears checked. She denies any runny nose, sniffles, loss of smell.  She has had some itching on her legs and neck/back. She thinks this is maybe from winter (dry skin) She then started having some itching around her vagina, and last week the area got raw.  She started using Vagisil (for irritation), which has helped. Over this past weekend--she noted odor to her urine.  Skin felt more raw, and is itching and it is burning when she urinates. This started 2 days ago. She denies any internal discomfort, seems to be external.   She is trying to drink more water. Denies any urgency or frequency of urination, denies incontinence. She didn't notice much odor to the urine today. She denies any vaginal discharge in the underwear, did notice some "white circles" in her urine.   The skin redness/irritation is worse today.  She has a persistent "bronchitis cough" since November.  She saw Audelia Acton for this on 12/3.  She was treated with z-pak, and given Proair and Flovent inhalers.  She got a little better, but then got COVID. She reports using inhalers--Flovent 2 P BID, and using albuterol 2-3x/day, when coughing. Dr. Harold Hedge has seen her for asthma and bronchitis in the past, hasn't seen him yet this year.  PMH, PSH, SH reviewed  Outpatient Encounter Medications as of 03/30/2020  Medication Sig Note  . amLODipine (NORVASC) 5 MG tablet TAKE 1 TABLET BY MOUTH EVERY DAY   . B Complex-C (B-COMPLEX WITH VITAMIN C) tablet Take 1 tablet by mouth 2 (two) times a day. 08/27/2018: Takes B-complex 100 once daily  . benzonatate (TESSALON)  200 MG capsule Take 1 capsule (200 mg total) by mouth 3 (three) times daily as needed.   . Calcium Carbonate-Vit D-Min (CALCIUM 1200 PO) Take 2,400 mg by mouth daily.  02/27/2019: Unsure of dose, takes 2 together.  . famotidine (PEPCID) 40 MG tablet TAKE 1 TABLET (40 MG TOTAL) BY MOUTH 2 (TWO) TIMES DAILY   . fluticasone (FLOVENT HFA) 110 MCG/ACT inhaler Inhale 2 puffs into the lungs in the morning and at bedtime.   Marland Kitchen HYDROcodone-acetaminophen (NORCO/VICODIN) 5-325 MG tablet Take 0.5 tablets by mouth every 6 (six) hours as needed for moderate pain. 12/18/2019: Taking 1/2 tablet 3-4x/week lately  . lamoTRIgine (LAMICTAL) 150 MG tablet Take 150 mg by mouth at bedtime.   . Liniments (SALONPAS PAIN RELIEF PATCH EX) Place 1 patch onto the skin daily as needed (pain).   . meloxicam (MOBIC) 7.5 MG tablet Take 7.5 mg by mouth daily.   . Multiple Vitamins-Minerals (HAIR SKIN AND NAILS FORMULA PO) Take 1 tablet by mouth daily.    Marland Kitchen PROAIR HFA 108 (90 Base) MCG/ACT inhaler Inhale 2 puffs into the lungs every 4 (four) hours as needed for wheezing or shortness of breath.   . risedronate (ACTONEL) 150 MG tablet Take 1 tablet (150 mg total) by mouth every 30 (thirty) days. with water on empty stomach, nothing by mouth or lie down for next 30 minutes.   Marland Kitchen VITAMIN D PO Take 5,000 Units by  mouth daily.    Marland Kitchen zolpidem (AMBIEN) 10 MG tablet Take 2.5 mg by mouth at bedtime.    . predniSONE (DELTASONE) 10 MG tablet 6 tablets day 1, 5 tablets day 2, 4 tablets day 3, 3 tablets day 4, 2 tablets day 5, 1 tablet day 6 (Patient not taking: Reported on 03/30/2020)    No facility-administered encounter medications on file as of 03/30/2020.   Allergies  Allergen Reactions  . Cymbalta [Duloxetine Hcl]     Abdominal pain  . Nitrofurantoin [Macrodantin]   . Sulfamethoxazole Hives  . Cefdinir Rash   ROS: no fever, chills. URI symptoms resolved, recent COVID. Decreased hearing since her illness.  No headaches, dizziness, chest  pain.  +persistent cough. Denies shortness of breath. Using inhalers, per HPI.  No nausea, vomiting, diarrhea.  Pain with urination and raw skin, with external itching per HPI. Denies vaginal discharge, bleeding.   PHYSICAL EXAM:  BP 122/80   Pulse 80   Temp 98 F (36.7 C)   Wt 130 lb 9.6 oz (59.2 kg)   LMP 08/15/1988 (Exact Date)   BMI 24.28 kg/m   Pleasant female, in good spirits, in no distress HEENT: conjunctiva and sclera are clear, EOMI. Wearing mask. TM's and EAC's normal. Nonobstructive cerumen. Neck: no lymphadenopathy or mas Lungs clear bilaterally, no wheezes, rales, ronchi, good air movement. Heart: regular rate and rhythm, no murmur Abdomen: soft, nontender, no mass, no suprapubic tenderness Back: no CVA tenderness External genitalia.  There is an area of irritation and erythema on the left labia majora, sensitive to touch. Some atrophic changes noted, otherwise the external genital region is normal. No abnormal discharge noted. Psych: normal mood, affect, hygiene and grooming Neuro: alert and oriented, normal gait, strength.  Urine dip: SG 1.015, trace blood, trace protein, trace leuks.   ASSESSMENT/PLAN:  Irritation of external female genitalia - No evidence on exam of yeast infection. Rec cont vagisil prn, use of Desitin or other barrier cream.  Burning with urination - suspect this is related to external skin irritation, no other UTI sx.  Check culture - Plan: POCT URINALYSIS DIP (CLINITEK), Urine Culture  Cough - residual since COVID. Trial of tessalon.  Reassured that her lungs are clear. - Plan: benzonatate (TESSALON) 200 MG capsule  Bilateral hearing loss, unspecified hearing loss type - noted since COVID. Normal exam--no cerumen impaction or effusion/infection. Poss ETD. Reassured   F/u with Dr. Harold Hedge if your cough doesn't improve.  Stay well hydrated (drink plenty of water). You may continue to use the Vagisil for irritation as needed. You want  to use zinc formulation such as Desitin (diaper cream) to use a protective barrier.  Put this on the external genital area to protect it from the urine.  I think the urine is touch the irritated skin and causing pain. I will send the urine to rule out infection, but am not convinced you have a urinary infection. Let us know if you develop more problems with urinary frequency, urgency, lower abdominal pain, blood in the urine, persistent odor.  Your lungs are clear. Try using the benzonatate as needed for cough. You do not have an infection that needs an antibiotic, and you are not wheezing. If your cough persists or worsens, you should follow-up with Dr. Harold Hedge.  Your ears were clear.  I suspect you likely have some mild congestion that is affecting the ears, and should continue to gradually improve.  There is no infection or wax build-up.

## 2020-04-01 LAB — URINE CULTURE

## 2020-04-09 ENCOUNTER — Ambulatory Visit: Payer: PPO | Admitting: Nurse Practitioner

## 2020-04-14 ENCOUNTER — Encounter: Payer: Self-pay | Admitting: Physician Assistant

## 2020-04-14 ENCOUNTER — Other Ambulatory Visit: Payer: Self-pay

## 2020-04-14 ENCOUNTER — Ambulatory Visit: Payer: PPO | Admitting: Physician Assistant

## 2020-04-14 VITALS — BP 138/80 | HR 82 | Ht 61.0 in | Wt 127.0 lb

## 2020-04-14 DIAGNOSIS — K648 Other hemorrhoids: Secondary | ICD-10-CM | POA: Diagnosis not present

## 2020-04-14 DIAGNOSIS — K625 Hemorrhage of anus and rectum: Secondary | ICD-10-CM

## 2020-04-14 DIAGNOSIS — K6289 Other specified diseases of anus and rectum: Secondary | ICD-10-CM | POA: Diagnosis not present

## 2020-04-14 MED ORDER — HYDROCORTISONE ACETATE 25 MG RE SUPP: 25.0000 mg | Freq: Every day | RECTAL | 5 refills | Status: AC

## 2020-04-14 NOTE — Patient Instructions (Signed)
If you are age 77 or older, your body mass index should be between 23-30. Your Body mass index is 24 kg/m. If this is out of the aforementioned range listed, please consider follow up with your Primary Care Provider.  If you are age 75 or younger, your body mass index should be between 19-25. Your Body mass index is 24 kg/m. If this is out of the aformentioned range listed, please consider follow up with your Primary Care Provider.   START Hydrocortisone suppositories, 1 suppository at night time for 10 days, then repeat as needed.   START Benefiber one dose daily.  Drink plenty of water, 60 ounces of water daily.  Follow up in 2 months if symptoms continue.  Thank you for entrusting me with your care and choosing [facility].  Amy Esterwood, PA-C

## 2020-04-14 NOTE — Progress Notes (Signed)
Subjective:    Patient ID: [name], female    DOB: 1945-10-22, 75 y.o.   MRN: [id number]  HPI [name] is a pleasant 75 year old white female, established with Dr. Hilarie Fredrickson who comes in today with concerns about rectal pain/irritation, intermittent rectal bleeding. She has history of hypertension, spinal stenosis, GERD, and depression.  She last underwent colonoscopy in May 2021 and was noted to have multiple left-sided diverticuli and small internal hemorrhoids, no polyps. She had EGD in October 2021 for follow-up of reflux esophagitis.  The esophagitis had resolved, and she was noted to have a 4 cm hiatal hernia.  Patient says ever since she had the colonoscopy in May 2021 she has noted a small amount of bright red blood with most of her bowel movements.  Bowels have been moving fairly regularly on docusate 1 at bedtime but she says her stools are very soft and seem to be more difficult to evacuate.  She says sometimes she has a bowel movement, knows that she has not completely evacuated the rectum and has to go back to the bathroom.  She has had more difficulty cleaning herself as well and has internal rectal discomfort after bowel movements which she describes as an irritated , sore sensation over the past several weeks.  She also feels something protruding from her rectum with straining and bowel movements which then reduces after a bowel movement without her having to do any manual maneuvers. She denies any abdominal pain.  She says she has been trying to drink a lot of water but is not good about eating a lot of fiber as she does not really like fruits etc.   Review of Systems Pertinent positive and negative review of systems were noted in the above HPI section.  All other review of systems was otherwise negative.  Outpatient Encounter Medications as of 04/14/2020  Medication Sig  . amLODipine (NORVASC) 5 MG tablet TAKE 1 TABLET BY MOUTH EVERY DAY  . B Complex-C (B-COMPLEX WITH VITAMIN C)  tablet Take 1 tablet by mouth 2 (two) times a day.  . Calcium Carbonate-Vit D-Min (CALCIUM 1200 PO) Take 2,400 mg by mouth daily.   . famotidine (PEPCID) 40 MG tablet TAKE 1 TABLET (40 MG TOTAL) BY MOUTH 2 (TWO) TIMES DAILY  . HYDROcodone-acetaminophen (NORCO/VICODIN) 5-325 MG tablet Take 0.5 tablets by mouth every 6 (six) hours as needed for moderate pain.  . hydrocortisone (ANUSOL-HC) 25 MG suppository Place 1 suppository (25 mg total) rectally at bedtime for 10 days.  Marland Kitchen lamoTRIgine (LAMICTAL) 150 MG tablet Take 150 mg by mouth at bedtime.  . Liniments (SALONPAS PAIN RELIEF PATCH EX) Place 1 patch onto the skin daily as needed (pain).  . Multiple Vitamins-Minerals (HAIR SKIN AND NAILS FORMULA PO) Take 1 tablet by mouth daily.   . risedronate (ACTONEL) 150 MG tablet Take 1 tablet (150 mg total) by mouth every 30 (thirty) days. with water on empty stomach, nothing by mouth or lie down for next 30 minutes.  Marland Kitchen VITAMIN D PO Take 5,000 Units by mouth daily.   Marland Kitchen zolpidem (AMBIEN) 10 MG tablet Take 2.5 mg by mouth at bedtime.   . [DISCONTINUED] benzonatate (TESSALON) 200 MG capsule Take 1 capsule (200 mg total) by mouth 3 (three) times daily as needed.  . [DISCONTINUED] fluticasone (FLOVENT HFA) 110 MCG/ACT inhaler Inhale 2 puffs into the lungs in the morning and at bedtime.  . [DISCONTINUED] meloxicam (MOBIC) 7.5 MG tablet Take 7.5 mg by mouth daily.  . [DISCONTINUED]  predniSONE (DELTASONE) 10 MG tablet 6 tablets day 1, 5 tablets day 2, 4 tablets day 3, 3 tablets day 4, 2 tablets day 5, 1 tablet day 6 (Patient not taking: Reported on 03/30/2020)  . [DISCONTINUED] PROAIR HFA 108 (90 Base) MCG/ACT inhaler Inhale 2 puffs into the lungs every 4 (four) hours as needed for wheezing or shortness of breath.   No facility-administered encounter medications on file as of 04/14/2020.   Allergies  Allergen Reactions  . Cymbalta [Duloxetine Hcl]     Abdominal pain  . Nitrofurantoin [Macrodantin]   .  Sulfamethoxazole Hives  . Cefdinir Rash   Patient Active Problem List   Diagnosis Date Noted  . Status post total shoulder arthroplasty, left 08/30/2018  . DSAP (disseminated superficial actinic porokeratosis) 08/26/2018  . Low back pain 08/16/2018  . Osteoarthritis of left shoulder 08/13/2018  . Vitamin D deficiency 01/08/2018  . Osteopenia with high risk of fracture 01/08/2018  . Essential hypertension 01/08/2018  . Mixed conductive and sensorineural hearing loss of right ear with restricted hearing of left ear 09/29/2016  . Fatigue 05/23/2016  . Cold intolerance 05/23/2016  . Depressive disorder, not elsewhere classified 11/30/2011  . Spinal stenosis 11/30/2011  . Neck pain on left side 11/30/2011  . Asthma 12/05/2007  . COUGH 12/05/2007   Social History   Socioeconomic History  . Marital status: Married    Spouse name: Not on file  . Number of children: 3  . Years of education: Not on file  . Highest education level: Not on file  Occupational History  . Occupation: housewife  Tobacco Use  . Smoking status: Former Smoker    Years: 1.00    Types: Cigarettes  . Smokeless tobacco: Never Used  . Tobacco comment: college year  Vaping Use  . Vaping Use: Never used  Substance and Sexual Activity  . Alcohol use: Yes    Comment: 2 glasses of wine/week   . Drug use: No  . Sexual activity: Not Currently    Partners: Male    Birth control/protection: Post-menopausal  Other Topics Concern  . Not on file  Social History Narrative   Lives with husband. Has 3 kids, 4 grandchildren. Daughter (Kendra--drug rep, lives here in [place])   Other children live in [place]; Son moved to [place]   Social Determinants of Health   Financial Resource Strain: Not on file  Food Insecurity: Not on file  Transportation Needs: Not on file  Physical Activity: Not on file  Stress: Not on file  Social Connections: Not on file  Intimate Partner Violence: Not on file    [name]'s  family history includes Arthritis in her mother; Breast cancer in her cousin; Depression in her brother and mother; Diabetes in her paternal uncle; Heart attack in her father; Heart disease (age of onset: 25) in her father; Pernicious anemia in her mother.      Objective:    Vitals:   04/14/20 1459  BP: 138/80  Pulse: 82    Physical Exam Well-developed well-nourished elderly WF in no acute distress.  Height, Weight 127 , BMI 24  HEENT; nontraumatic normocephalic, EOMI, PE R LA, sclera anicteric. Oropharynx; not done Neck; supple, no JVD Cardiovascular; regular rate and rhythm with S1-S2, no murmur rub or gallop Pulmonary; Clear bilaterally Abdomen; soft, nontender, nondistended, no palpable mass or hepatosplenomegaly, bowel sounds are active Rectal; no external hemorrhoids noted, no prolapsing internal hemorrhoids, on digital exam she has some tenderness, no palpable fissure , fullness consistent  with internal hemorrhoids, stool heme-negative, I cannot demonstrate any rectal prolapse with Valsalva Skin; benign exam, no jaundice rash or appreciable lesions Extremities; no clubbing cyanosis or edema skin warm and dry Neuro/Psych; alert and oriented x4, grossly nonfocal mood and affect appropriate       Assessment & Plan:   #45 75 year old white female with chronic mild constipation currently on docusate which is causing soft pasty stools that are difficult to evacuate. Patient has noticed intermittent small amounts of bright red blood with bowel movements over the past several months and over the past weeks has had internal rectal discomfort/irritation and feels as if she is having a prolapse from her rectum with bowel movements and straining. She does have previously documented small internal hemorrhoids.  I think hemorrhoids are symptomatic currently and on exam seem to be somewhat edematous. Cannot completely rule out partial rectal prolapse though I cannot demonstrate this on exam  today.  #2 diverticulosis 3.  Spinal stenosis 4.  GERD stable 5.  History of depression 6.  Hypertension  Plan; stop stool softeners Start Benefiber 1 dose daily in a glass of water to bulk stool Start Anusol HC suppositories 1 per rectum at bedtime x10 days then repeat course as needed. She will follow-up with Dr. Hilarie Fredrickson or myself in 2 months if symptoms have not significantly improved.  We briefly discussed potential in office hemorrhoidal banding which she would like to avoid if possible.  Cainan Trull Genia Harold PA-C 04/14/2020   Cc: [name], MD

## 2020-04-16 DIAGNOSIS — Z8616 Personal history of COVID-19: Secondary | ICD-10-CM | POA: Diagnosis not present

## 2020-04-16 DIAGNOSIS — H903 Sensorineural hearing loss, bilateral: Secondary | ICD-10-CM | POA: Diagnosis not present

## 2020-04-16 DIAGNOSIS — H6123 Impacted cerumen, bilateral: Secondary | ICD-10-CM | POA: Diagnosis not present

## 2020-04-18 NOTE — Progress Notes (Signed)
Addendum: Reviewed and agree with assessment and management plan. Xander Jutras M, MD  

## 2020-04-23 ENCOUNTER — Ambulatory Visit: Payer: PPO

## 2020-05-04 DIAGNOSIS — M79672 Pain in left foot: Secondary | ICD-10-CM | POA: Diagnosis not present

## 2020-05-15 ENCOUNTER — Inpatient Hospital Stay: Admission: RE | Admit: 2020-05-15 | Payer: PPO | Source: Ambulatory Visit

## 2020-05-18 ENCOUNTER — Other Ambulatory Visit: Payer: Self-pay

## 2020-05-18 ENCOUNTER — Encounter: Payer: Self-pay | Admitting: Podiatry

## 2020-05-18 ENCOUNTER — Ambulatory Visit: Payer: PPO | Admitting: Podiatry

## 2020-05-18 ENCOUNTER — Ambulatory Visit (INDEPENDENT_AMBULATORY_CARE_PROVIDER_SITE_OTHER): Payer: PPO

## 2020-05-18 DIAGNOSIS — M79671 Pain in right foot: Secondary | ICD-10-CM

## 2020-05-18 DIAGNOSIS — D3610 Benign neoplasm of peripheral nerves and autonomic nervous system, unspecified: Secondary | ICD-10-CM

## 2020-05-18 DIAGNOSIS — G5763 Lesion of plantar nerve, bilateral lower limbs: Secondary | ICD-10-CM | POA: Diagnosis not present

## 2020-05-18 DIAGNOSIS — M79672 Pain in left foot: Secondary | ICD-10-CM | POA: Diagnosis not present

## 2020-05-18 NOTE — Progress Notes (Signed)
Subjective:   Patient ID: [name], female   DOB: 75 y.o.   MRN: [id number]   HPI Patient states she has had recent reoccurrence of neuroma symptomatology and seemed to do very well with the medication we utilized 3 years ago   ROS      Objective:  Physical Exam  Neurovascular status intact with positive Biagio Borg sign noted left and right with currently the left being worse than the right but the nerve appeared to be larger right over left     Assessment:  Chronic neuroma symptomatology bilateral most likely     Plan:  H&P x-ray reviewed sterile prep done injected the third interspace bilateral purified alcohol Marcaine solution 1.5 cc bilateral sterile dressings applied reappoint 1 week to reevaluate

## 2020-06-03 ENCOUNTER — Other Ambulatory Visit: Payer: Self-pay

## 2020-06-03 ENCOUNTER — Encounter: Payer: Self-pay | Admitting: Obstetrics and Gynecology

## 2020-06-03 ENCOUNTER — Ambulatory Visit: Payer: PPO | Admitting: Obstetrics and Gynecology

## 2020-06-03 VITALS — BP 110/64 | HR 67 | Ht 61.0 in | Wt 128.0 lb

## 2020-06-03 DIAGNOSIS — N763 Subacute and chronic vulvitis: Secondary | ICD-10-CM

## 2020-06-03 DIAGNOSIS — L309 Dermatitis, unspecified: Secondary | ICD-10-CM

## 2020-06-03 LAB — WET PREP FOR TRICH, YEAST, CLUE

## 2020-06-03 MED ORDER — BETAMETHASONE VALERATE 0.1 % EX OINT: 1.0000 "application " | TOPICAL_OINTMENT | Freq: Two times a day (BID) | CUTANEOUS | 0 refills | Status: DC

## 2020-06-03 NOTE — Progress Notes (Signed)
GYNECOLOGY  VISIT   HPI: 75 y.o.   Married White or Caucasian Not Hispanic or Latino  female   [id number] with Patient's last menstrual period was 08/15/1988 (exact date).   here for  Vaginal irritation. She states that she is having rectal itching too.  She c/o a 2 month c/o irritation at the vaginal opening and perianal area. Her primary had her use a diaper rash cream. No vaginal discharge.  She is drinking tons of water, gets fiber, stool softeners. Has a BM 1 x day, has to strain.   GYNECOLOGIC HISTORY: Patient's last menstrual period was 08/15/1988 (exact date). Contraception: PMP Menopausal hormone therapy: none         OB History    Gravida  3   Para  3   Term  3   Preterm  0   AB  0   Living  3     SAB  0   IAB  0   Ectopic  0   Multiple  0   Live Births  3              Patient Active Problem List   Diagnosis Date Noted  . Status post total shoulder arthroplasty, left 08/30/2018  . DSAP (disseminated superficial actinic porokeratosis) 08/26/2018  . Low back pain 08/16/2018  . Osteoarthritis of left shoulder 08/13/2018  . Vitamin D deficiency 01/08/2018  . Osteopenia with high risk of fracture 01/08/2018  . Essential hypertension 01/08/2018  . Mixed conductive and sensorineural hearing loss of right ear with restricted hearing of left ear 09/29/2016  . Fatigue 05/23/2016  . Cold intolerance 05/23/2016  . Depressive disorder, not elsewhere classified 11/30/2011  . Spinal stenosis 11/30/2011  . Neck pain on left side 11/30/2011  . Asthma 12/05/2007  . COUGH 12/05/2007    Past Medical History:  Diagnosis Date  . Anxiety    sees [facility] [place], [place])  . Asthma   . Colon polyps 08/2014   tubular adenoma and hyperplastic polyps (Dr. Hilarie Fredrickson)  . DDD (degenerative disc disease), cervical dr Patrice Paradise   s/p surgery, ongoing pain  . Depression    h/o hospitalization with ECT  . Diverticulosis   . Esophagitis   . Hiatal hernia   . History of  insomnia   . Hypertension   . Internal hemorrhoids   . Neck pain   . Osteopenia stopped fosamax ~2006  . Post-operative nausea and vomiting   . Vertigo   . Vitamin D deficiency borderline(28)-2010   treated by her GYN    Past Surgical History:  Procedure Laterality Date  . CATARACT EXTRACTION, BILATERAL Bilateral Sept/October 2019  . HEMORRHOID SURGERY  2006, 12/2014  . KNEE ARTHROSCOPY Right   . LASIK     Dr. Lucita Ferrara  . NASAL SEPTOPLASTY W/ TURBINOPLASTY  1986  . NECK SURGERY  02/2010([facility])1/03,2005([facility])  . REDUCTION MAMMAPLASTY Bilateral 07/24/2008  . TONSILLECTOMY    . TOTAL HIP ARTHROPLASTY Left 08/2007      . TOTAL SHOULDER ARTHROPLASTY Left 08/30/2018   Procedure: TOTAL SHOULDER ARTHROPLASTY;  Surgeon: Justice Britain, MD;  Location: [facility];  Service: Orthopedics;  Laterality: Left;  13min, move scope to 12:30pm    Current Outpatient Medications  Medication Sig Dispense Refill  . amLODipine (NORVASC) 5 MG tablet TAKE 1 TABLET BY MOUTH EVERY DAY 90 tablet 1  . B Complex-C (B-COMPLEX WITH VITAMIN C) tablet Take 1 tablet by mouth 2 (two) times a day.    . Calcium Carbonate-Vit  D-Min (CALCIUM 1200 PO) Take 2,400 mg by mouth daily.     . famotidine (PEPCID) 40 MG tablet TAKE 1 TABLET (40 MG TOTAL) BY MOUTH 2 (TWO) TIMES DAILY 180 tablet 1  . HYDROcodone-acetaminophen (NORCO/VICODIN) 5-325 MG tablet Take 0.5 tablets by mouth every 6 (six) hours as needed for moderate pain.    Marland Kitchen lamoTRIgine (LAMICTAL) 150 MG tablet Take 150 mg by mouth at bedtime.    . Liniments (SALONPAS PAIN RELIEF PATCH EX) Place 1 patch onto the skin daily as needed (pain).    . Multiple Vitamins-Minerals (HAIR SKIN AND NAILS FORMULA PO) Take 1 tablet by mouth daily.     . risedronate (ACTONEL) 150 MG tablet Take 1 tablet (150 mg total) by mouth every 30 (thirty) days. with water on empty stomach, nothing by mouth or lie down for next 30 minutes. 4 tablet 3  . VITAMIN D PO Take 5,000 Units by mouth daily.      Marland Kitchen zolpidem (AMBIEN) 10 MG tablet Take 2.5 mg by mouth at bedtime.      No current facility-administered medications for this visit.     ALLERGIES: Cymbalta [duloxetine hcl], Nitrofurantoin [macrodantin], Sulfamethoxazole, and Cefdinir  Family History  Problem Relation Age of Onset  . Arthritis Mother   . Depression Mother   . Pernicious anemia Mother   . Heart disease Father 19  . Heart attack Father   . Depression Brother   . Diabetes Paternal Uncle   . Breast cancer Cousin        paternal  . Colon cancer Neg Hx   . Rectal cancer Neg Hx   . Stomach cancer Neg Hx     Social History   Socioeconomic History  . Marital status: Married    Spouse name: Not on file  . Number of children: 3  . Years of education: Not on file  . Highest education level: Not on file  Occupational History  . Occupation: housewife  Tobacco Use  . Smoking status: Former Smoker    Years: 1.00    Types: Cigarettes  . Smokeless tobacco: Never Used  . Tobacco comment: college year  Vaping Use  . Vaping Use: Never used  Substance and Sexual Activity  . Alcohol use: Yes    Comment: 2 glasses of wine/week   . Drug use: No  . Sexual activity: Not Currently    Partners: Male    Birth control/protection: Post-menopausal  Other Topics Concern  . Not on file  Social History Narrative   Lives with husband. Has 3 kids, 4 grandchildren. Daughter (Kendra--drug rep, lives here in [place])   Other children live in [place]; Son moved to [place]   Social Determinants of Health   Financial Resource Strain: Not on file  Food Insecurity: Not on file  Transportation Needs: Not on file  Physical Activity: Not on file  Stress: Not on file  Social Connections: Not on file  Intimate Partner Violence: Not on file    Review of Systems  All other systems reviewed and are negative.   PHYSICAL EXAMINATION:    BP 110/64   Pulse 67   Ht 5\' 1"  (1.549 m)   Wt 128 lb (58.1 kg)   LMP 08/15/1988 (Exact  Date)   SpO2 98%   BMI 24.19 kg/m     General appearance: alert, cooperative and appears stated age   Pelvic: External genitalia:  Atrophic, thinning of the tissue, mild erythema, minimal whitening on the left inner labia minora.  Urethra:  normal appearing urethra with no masses, tenderness or lesions              Bartholins and Skenes: normal                 Vagina: very atrophic appearing vagina with normal color and discharge, no lesions              Cervix: flush with vagina.              Perianal region with mild diffuse whitening   Chaperone was present for exam.  1. Chronic vulvitis - betamethasone valerate ointment (VALISONE) 0.1 %; Apply 1 application topically 2 (two) times daily.  Dispense: 30 g; Refill: 0 - WET PREP FOR TRICH, YEAST, CLUE: negative - Candidiasis, PCR -f/u in 2 weeks -Information on vulvar skin care given  2. Perianal dermatitis - betamethasone valerate ointment (VALISONE) 0.1 %; Apply 1 application topically 2 (two) times daily.  Dispense: 30 g; Refill: 0

## 2020-06-08 ENCOUNTER — Ambulatory Visit: Payer: PPO | Admitting: Podiatry

## 2020-06-08 ENCOUNTER — Encounter: Payer: Self-pay | Admitting: Podiatry

## 2020-06-08 ENCOUNTER — Other Ambulatory Visit: Payer: Self-pay

## 2020-06-08 ENCOUNTER — Ambulatory Visit (INDEPENDENT_AMBULATORY_CARE_PROVIDER_SITE_OTHER): Payer: PPO

## 2020-06-08 DIAGNOSIS — D3610 Benign neoplasm of peripheral nerves and autonomic nervous system, unspecified: Secondary | ICD-10-CM | POA: Diagnosis not present

## 2020-06-08 LAB — CANDIDIASIS, PCR
C. albicans, DNA: NOT DETECTED
C. glabrata, DNA: NOT DETECTED
C. parapsilosis, DNA: NOT DETECTED
C. tropicalis, DNA: NOT DETECTED

## 2020-06-09 NOTE — Progress Notes (Signed)
Subjective:   Patient ID: [name], female   DOB: 75 y.o.   MRN: [id number]   HPI Patient states improved but still having discomfort between third and fourth toes of both the with radiating discomforts   ROS      Objective:  Physical Exam  Neurovascular status intact with continued positive Biagio Borg sign bilateral third interspace with diminishment of discomfort upon palpation     Assessment:  Neuroma symptomatology improved present bilateral     Plan:  Sterile prep went ahead today and reinjected the third interspace with a purified alcohol Marcaine solution bilateral and patient will be seen back to recheck as needed

## 2020-06-22 ENCOUNTER — Other Ambulatory Visit: Payer: Self-pay | Admitting: *Deleted

## 2020-06-22 DIAGNOSIS — L720 Epidermal cyst: Secondary | ICD-10-CM | POA: Diagnosis not present

## 2020-06-22 DIAGNOSIS — M8580 Other specified disorders of bone density and structure, unspecified site: Secondary | ICD-10-CM

## 2020-06-22 DIAGNOSIS — L700 Acne vulgaris: Secondary | ICD-10-CM | POA: Diagnosis not present

## 2020-06-22 MED ORDER — RISEDRONATE SODIUM 150 MG PO TABS: 150.0000 mg | ORAL_TABLET | ORAL | 1 refills | Status: DC

## 2020-07-06 ENCOUNTER — Ambulatory Visit: Payer: PPO

## 2020-07-09 ENCOUNTER — Ambulatory Visit: Payer: PPO | Admitting: Physician Assistant

## 2020-07-28 DIAGNOSIS — Z23 Encounter for immunization: Secondary | ICD-10-CM | POA: Diagnosis not present

## 2020-07-30 ENCOUNTER — Other Ambulatory Visit: Payer: Self-pay

## 2020-07-30 ENCOUNTER — Encounter: Payer: Self-pay | Admitting: Family Medicine

## 2020-07-30 ENCOUNTER — Telehealth (INDEPENDENT_AMBULATORY_CARE_PROVIDER_SITE_OTHER): Payer: PPO | Admitting: Family Medicine

## 2020-07-30 VITALS — BP 137/72 | Temp 98.6°F | Wt 128.0 lb

## 2020-07-30 DIAGNOSIS — J029 Acute pharyngitis, unspecified: Secondary | ICD-10-CM

## 2020-07-30 NOTE — Progress Notes (Signed)
   Subjective:    Patient ID: Vennessa Mcfarland, female    DOB: 02/08/1946, 75 y.o.   MRN: [id number]  HPI Documentation for virtual audio and video telecommunications through [place] encounter: The patient was located at home. 2 patient identifiers used.  The provider was located in the office. The patient did consent to this visit and is aware of possible charges through their insurance for this visit. The other persons participating in this telemedicine service were none. Time spent on call was 5 minutes and in review of previous records >20 minutes total for counseling and coordination of care. This virtual service is not related to other E/M service within previous 7 days. She complains of a 4-day history of slight sore throat and right-sided postnasal drainage.  She did do a COVID test which was negative.  No earache, cough, congestion.  She recently had a COVID booster.  She has been using OTC medications for relief of her symptoms.  Review of Systems     Objective:   Physical Exam Alert and in no distress otherwise not examined       Assessment & Plan:  Sore throat I reassured her that I did not think she was in any danger.  Recommend she continue to treat her symptoms with over-the-counter medications.  Explained that she should start to get better over the next several days.  She was comfortable with that.

## 2020-08-03 ENCOUNTER — Encounter: Payer: Self-pay | Admitting: *Deleted

## 2020-08-10 ENCOUNTER — Other Ambulatory Visit: Payer: Self-pay | Admitting: Medical

## 2020-08-21 ENCOUNTER — Ambulatory Visit: Payer: PPO

## 2020-08-30 ENCOUNTER — Other Ambulatory Visit: Payer: Self-pay | Admitting: Obstetrics and Gynecology

## 2020-08-30 ENCOUNTER — Other Ambulatory Visit: Payer: Self-pay | Admitting: Family Medicine

## 2020-08-30 DIAGNOSIS — I10 Essential (primary) hypertension: Secondary | ICD-10-CM

## 2020-08-30 DIAGNOSIS — N763 Subacute and chronic vulvitis: Secondary | ICD-10-CM

## 2020-08-30 DIAGNOSIS — L309 Dermatitis, unspecified: Secondary | ICD-10-CM

## 2020-09-01 NOTE — Telephone Encounter (Signed)
Tried to call pt but vm not taking messages.  Trying to find out if pt needs a refill before next week's appointment

## 2020-09-02 NOTE — Telephone Encounter (Signed)
Tried to call pt but vm not taking messages

## 2020-09-04 DIAGNOSIS — F3342 Major depressive disorder, recurrent, in full remission: Secondary | ICD-10-CM | POA: Diagnosis not present

## 2020-09-06 NOTE — Progress Notes (Signed)
Chief Complaint  Patient presents with   Medicare Wellness    Nonfasting (she forgot and had some chicken soup) AWV, no pap. No new concerns. Saw Dr. Saintclair Halsted and he said there was nothing he could do other than shots and she didn't want them. Has not seen allergist since last visit here. Actonel was sent to [place] from [place] (not sure why I didn't catch the #4 instead of #3) she said they only gave her #3, so she should have some left-she will check when she gets home.     [name] is a 75 y.o. female who presents for annual Medicare wellness visit and follow-up on chronic medical conditions.   She has the following concerns:  Last week she felt slightly dizzy, wasn't spinning like vertigo she had before, just "a little dizzy".  One episode where she was very cold, couldn't get warm.  It had been cooler outside.  Today she had a recurrent feeling of "coldness". Just the 2 episodes, both when the weather was cooler than it had been.  She has worried about anemia, but had this checked at her last visit when she had the same concern).  At her last visit with me in 11/2019 she was complaining fatigue.  We rechecked labs (c-met, CBC, just had normal D-OH in 08/2019) which were fine.  We discussed adequate nutrition and hydration, and the possibility of chronic pain/depression contributing. We discussed possibly checking sleep study if persists.  She had reported waking up tired, has known snoring, but husband denied apnea (don't sleep in the same room, but had been away together).   She sleeps well overall.   Today she reports waking up tired, but thinks it is from fighting her chronic pain.  Wakes up a little groggy, unsure if it could be related to her Lorrin Mais she takes. She doesn't think she is as tired now as she was when she was here in October.  "I'm always fighting pain" and medications make her sleepy when she takes them.  Osteopenia with elevated FRAX score:  DEXA was in 11/2019, with T-2.4 at L  forearm/radius (down from -1.7 in 2019), T-2.2 at R fem neck. FRAX score was 15.1% for major osteoporotic fracture, 4.6% for hip fracture (elevated). She was started on Actonel $RemoveBe'150mg'ljVJKUpfu$  monthly (after confirming with Dr. Hilarie Fredrickson that this would be okay, as she had recently been treated for esophagitis.  She is tolerating the medication without any side effects.  No chest pain, no change in heartburn related to when she takes the medication, no nausea. She is compliant with her Ca and D.  She gets weight-bearing execises 3x/week.  Vitamin D deficiency:  level was low at 25.1 when checked by GYN in 01/2018.  OTC vitamin D3 dose was increased from 2000 IU to 5000 IU and recheck in July 2020 was 35.8. Last level was normal at 40 in 08/2019. She continues to take 5000 IU daily.    GERD and esophagitis:  She developed reflux/heartburn after taking potassium supplements.  She had some improvement on omeprazole, but also caused dizziness, and symptoms recurred when she stopped it. She was referred to GI, who put her on pepcid ($RemoveBe'20mg'lIZvRQEuL$  qd), and did EGD in 06/2019.  After EGD, her Pepcid dose was increased to $RemoveBefo'40mg'TzboTFBfcUw$  BID. EGD showed reflux esophagitis (path showed reactive squamous cells), 4 cm hiatal hernia. She had f/u EGD in 11/2019 which showed "LA Grade A" reflux esophagitis with no bleeding, improved compared to May 2021, but not  completely resolved. Persistent inflammation is predominantly related to hiatal hernia (4cm).  She is currently taking pepcid $RemoveBefor'40mg'qqEpQiSiglzA$  every night.  She doesn't always take it in the morning, she did today (after she developed heartburn from lunch).  She still has some mild heartburn intermittently, not every day. Denies chest pain or dysphagia.  Hypertension follow-up: She has been taking amlodipine $RemoveBeforeDEI'5mg'zhWXVHuRqJFWtSrg$  daily without any side effects. She gets some mild, intermittent edema. BP's have been running 130/80 at home. Denies headaches, chest pain, palpitations. Denies dizziness, other than some last  week--admits she doesn't always drink enough water.   Chronic neck pain.  She saw Dr. Saintclair Halsted in the Fall, didn't have any other treatments to offer (other than guided pain shots, which she stated wasn't helpful, didn't want done again).  Previously had series of injections through Dr. Towanda Malkin office (last year), which didn't help.  She sees Dr. Patrice Paradise (or his PA) every 6 months, where she gets her hydrocodone (takes 1/2-1/4 pill every other day). SalonPas, topical meds and massage help some.  She recently started taking Aleve recently, took 1 today.  It did help some last week.  No longer taking Tylenol, didn't help. Sometimes will take an Advil. Pain no longer radiates into her arm (had shoulder surgery). She gets massages fairly regularly, and this helps.  Saw Dr. Delilah Shan for L foot pain (forefoot) in March, given injection for possible neuroma, which didn't help much. She then went back to Dr. Paulla Dolly in May, got 2 more shots, which helped.    Allergies and asthma--No longer sees allergist, goes only when sick, and has used Symbicort prn with bronchitis.  She denies any wheezing or shortness of breath.  She has albuterol at home, to use when she get a "bronchial thing".  Was at a house with 3 dogs yesterday, started coughing.  Uses albuterol this morning, when she was coughing, and feels better. She reports having two inhalers at home, one is red and one is yellow.  Hasn't seen allergist since prior to [place].  Anxiety and insomnia:  On lamictal and ambien per psychiatrist.  Sleeping well and moods are good.  Immunization History  Administered Date(s) Administered   Influenza Split 11/13/2015   Influenza, High Dose Seasonal PF 03/05/2014, 12/15/2014, 11/25/2016, 12/04/2017, 11/21/2018, 11/27/2019   Influenza,inj,quad, With Preservative 11/28/2016, 11/28/2017   Influenza-Unspecified 12/29/2012, 11/25/2016   PFIZER Comirnaty(Gray Top)Covid-19 Tri-Sucrose Vaccine 07/28/2020   PFIZER(Purple  Top)SARS-COV-2 Vaccination 03/15/2019, 04/05/2019, 12/11/2019, 07/28/2020   Pneumococcal Conjugate-13 07/02/2014   Pneumococcal Polysaccharide-23 08/29/2007, 07/06/2015   Tdap 08/29/2007, 12/26/2017   Zoster Recombinat (Shingrix) 12/17/2018, 02/28/2019   Zoster, Live 08/29/2007   Last Pap smear: 03/2014 with GYN, normal; last GYN exam 05/2020 Last mammogram: 11/2017 (pt thinks she had one since), scheduled for 09/2020 Last colonoscopy: 06/2019--diverticulosis, small internal hemorrhoids, no polyps. F/u not needed (prior was in 08/2014, had tubular adenoma) Last DEXA: 11/2019, T-2.4 at L forearm/radius (down from -1.7 in 2019), T-2.2 at R fem neck. FRAX score 15.1% for major osteoporotic fracture, 4.6% for hip fracture (elevated). Prior scan was 11/2017 T-2.1 at R fem neck, elevated FRAX risk at hip, 3.6% Dentist: twice yearly Ophtho: yearly Exercise:  Going to 02 gym 3x/wk ([employer]), including cardio and weights. She is no longer walking, just going to the gym.  Hep C screen: negative, done by GYN 04/2015  Other doctors caring for patient include:  GYN: Dr. Talbert Nan Dentist: Dr. Gloriann Loan Ophtho: Dr. Marica Otter ([facility] did cataract surgery) Dermatologist: Dr.Gould, Dr. Gwyneth Sprout [facility])  Podiatrist: Dr. Paulla Dolly Ortho-spine: Dr. Rennis Harding Ortho--Dr. Alvan Dame (seen in the past for L hip bursitis), Dr. Onnie Graham (shoulder), Dr. Delilah Shan (foot pain) Neurosurgeon--Dr. Sherwood Gambler (no longer sees), PA [facility] at [facility] (08/2017); saw Dr. Saintclair Halsted 2021. Pain doctor--Has seen Dr. Maryjean Ka, Dr. Nelva Bush, also has seen Dr. Brien Few in the past.  Doesn't see one currently, Dr. Towanda Malkin office prescribes her pain medications Psych: Noemi Chapel (PA at [facility]) GI: Dr. Hilarie Fredrickson ENT: Dr. Redmond Baseman (cerumen removal) Allergist: Dr. Harold Hedge Physical Therapist: previously saw Linton Rump ([facility])     Depression screen: negative  Fall screen: none Functional status screen: decreased hearing (has hearing aids, doesn't wear  them, states she needs new ones), some concentration issues in the morning (tries to read the paper too early, thinks related to her medications). Mini-Cog screening: normal See questionnaires in epic.   End of Life Discussion:  Patient has a living will and medical power of attorney (brought in 08/2018)   PMH, [place], [place] and FH were reviewed and updated  Drinking more alcohol (5-7 days/week, prev just 2x/week).  Outpatient Encounter Medications as of 09/07/2020  Medication Sig Note   amLODipine (NORVASC) 5 MG tablet TAKE 1 TABLET BY MOUTH EVERY DAY    B Complex-C (B-COMPLEX WITH VITAMIN C) tablet Take 1 tablet by mouth 2 (two) times a day. 08/27/2018: Takes B-complex 100 once daily   Calcium Carbonate-Vit D-Min (CALCIUM 1200 PO) Take 2,400 mg by mouth daily.  02/27/2019: Unsure of dose, takes 2 together.   famotidine (PEPCID) 40 MG tablet TAKE 1 TABLET (40 MG TOTAL) BY MOUTH 2 (TWO) TIMES DAILY 09/07/2020: She always takes it at night. Sometimes takes it in the morning.   HYDROcodone-acetaminophen (NORCO/VICODIN) 5-325 MG tablet Take 0.5 tablets by mouth every 6 (six) hours as needed for moderate pain. 12/18/2019: Taking 1/2 tablet 3-4x/week lately   lamoTRIgine (LAMICTAL) 150 MG tablet Take 150 mg by mouth at bedtime.    Multiple Vitamins-Minerals (HAIR SKIN AND NAILS FORMULA PO) Take 1 tablet by mouth daily.     risedronate (ACTONEL) 150 MG tablet Take 1 tablet (150 mg total) by mouth every 30 (thirty) days. with water on empty stomach, nothing by mouth or lie down for next 30 minutes.    VITAMIN D PO Take 5,000 Units by mouth daily.     zolpidem (AMBIEN) 10 MG tablet Take 2.5 mg by mouth at bedtime.     betamethasone valerate ointment (VALISONE) 0.1 % Apply 1 application topically 2 (two) times daily. (Patient not taking: No sig reported)    Liniments (SALONPAS PAIN RELIEF PATCH EX) Place 1 patch onto the skin daily as needed (pain). (Patient not taking: Reported on 09/07/2020)    No  facility-administered encounter medications on file as of 09/07/2020.   Allergies  Allergen Reactions   Cymbalta [Duloxetine Hcl]     Abdominal pain   Nitrofurantoin [Macrodantin]    Sulfamethoxazole Hives   Cefdinir Rash    ROS:  The patient denies anorexia, fever, weight changes, headaches,  vision changes, ear pain, sore throat, breast concerns, chest pain, palpitations, dizziness, syncope, dyspnea on exertion,  nausea, vomiting, diarrhea, abdominal pain, melena, hematochezia, hematuria, incontinence, dysuria, vaginal bleeding, discharge, odor or itch, numbness, tingling, weakness, tremor, suspicious skin lesions, depression, anxiety (well controlled), abnormal bleeding or enlarged lymph nodes. +hearing loss (has hearing aids, doesn't wear them, needs new ones) Neck pain per HPI.  GERD/epigastric pain--pain resolved, occasional reflux, per HPI Denies shortness of breath. Used inhaler this morning when  she was coughing "bronchial cough".  bruising on forearms, no as bad as in the past. Mild constipation. "Cold sensation"--twice in the last couple of weeks, felt very cold.    PHYSICAL EXAM:  BP 130/78   Pulse 68   Ht $R'5\' 1"'Tp$  (1.549 m)   Wt 129 lb 9.6 oz (58.8 kg)   LMP 08/15/1988 (Exact Date)   BMI 24.49 kg/m   Wt Readings from Last 3 Encounters:  09/07/20 129 lb 9.6 oz (58.8 kg)  07/30/20 128 lb (58.1 kg)  06/03/20 128 lb (58.1 kg)    Well appearing, pleasant elderly female in no distress.  HEENT: conjunctiva and sclera are clear, EOMI. TM's and EAC's normal. Wearing mask Neck: no lymphadenopathy, thyromegaly or mass. No spinal tenderness. WHSS.   Heart: regular rate and rhythm, no murmur Lungs: clear bilaterally, good air movement Back: no spinal or CVA tenderness, no SI tenderness Abdomen: soft, nontender, no mass Extremities: Normal pulses, no edema Psych: normal mood, affect, hygiene, grooming, eye contact, speech.   Neuro: alert and oriented, normal gait. Breast  and pelvic exams deferred to her GYN Skin: normal turgor.  There are some purpura and bruises noted on her R arm and L elbow   ASSESSMENT/PLAN:  Medicare annual wellness visit, subsequent  Essential hypertension - controlled, cont amlodipine - Plan: Comprehensive metabolic panel, amLODipine (NORVASC) 5 MG tablet  Vitamin D deficiency - cont current supplements  Senile purpura ([place]) - rec her to avoid NSAIDS (for GI reasons), likely contributing to bruising/purpura  Osteopenia with high risk of fracture  Gastroesophageal reflux disease with esophagitis without hemorrhage - advised to stop NSAIDs. Cont current meds  Fatigue, unspecified type - discussed her medications which can contribute. Will recheck labs, expect will be normal. Hx not c/w OSA - Plan: CBC with Differential/Platelet, Comprehensive metabolic panel, TSH  Medication monitoring encounter - Plan: CBC with Differential/Platelet, Comprehensive metabolic panel   Discussed monthly self breast exams and yearly mammograms (past due, scheduled); at least 30 minutes of aerobic activity at least 5 days/week, weight-bearing exercise at least 2x/wk; proper sunscreen use reviewed; healthy diet, including goals of calcium and vitamin D intake and alcohol recommendations (less than or equal to 1 drink/day) reviewed; regular seatbelt use; changing batteries in smoke detectors, carbon monoxide detectors.  Immunization recommendations discussed--continue yearly high dose flu shots, UTD. Colonoscopy recommendations reviewed--UTD DEXA due 11/2021  MOST form reviewed and completed.  Full Code, Full Care.   Medicare Attestation I have personally reviewed: The patient's medical and social history Their use of alcohol, tobacco or illicit drugs Their current medications and supplements The patient's functional ability including ADLs,fall risks, home safety risks, cognitive, and hearing and visual impairment Diet and physical  activities Evidence for depression or mood disorders  The patient's weight, height, BMI have been recorded in the chart.  I have made referrals, counseling, and provided education to the patient based on review of the above and I have provided the patient with a written personalized care plan for preventive services.

## 2020-09-06 NOTE — Patient Instructions (Addendum)
HEALTH MAINTENANCE RECOMMENDATIONS:  It is recommended that you get at least 30 minutes of aerobic exercise at least 5 days/week (for weight loss, you may need as much as 60-90 minutes). This can be any activity that gets your heart rate up. This can be divided in 10-15 minute intervals if needed, but try and build up your endurance at least once a week.  Weight bearing exercise is also recommended twice weekly.  Eat a healthy diet with lots of vegetables, fruits and fiber.  "Colorful" foods have a lot of vitamins (ie green vegetables, tomatoes, red peppers, etc).  Limit sweet tea, regular sodas and alcoholic beverages, all of which has a lot of calories and sugar.  Up to 1 alcoholic drink daily may be beneficial for women (unless trying to lose weight, watch sugars).  Drink a lot of water.  Calcium recommendations are 1200-1500 mg daily (1500 mg for postmenopausal women or women without ovaries), and vitamin D 1000 IU daily.  This should be obtained from diet and/or supplements (vitamins), and calcium should not be taken all at once, but in divided doses.  Monthly self breast exams and yearly mammograms for women over the age of 20 is recommended.  Sunscreen of at least SPF 30 should be used on all sun-exposed parts of the skin when outside between the hours of 10 am and 4 pm (not just when at beach or pool, but even with exercise, golf, tennis, and yard work!)  Use a sunscreen that says "broad spectrum" so it covers both UVA and UVB rays, and make sure to reapply every 1-2 hours.  Remember to change the batteries in your smoke detectors when changing your clock times in the spring and fall. Carbon monoxide detectors are recommended for your home.  Use your seat belt every time you are in a car, and please drive safely and not be distracted with cell phones and texting while driving.   [name] , Thank you for taking time to come for your Medicare Wellness Visit. I appreciate your ongoing  commitment to your health goals. Please review the following plan we discussed and let me know if I can assist you in the future.    This is a list of the screening recommended for you and due dates:  Health Maintenance  Topic Date Due   Mammogram  12/07/2018   Flu Shot  09/28/2020   COVID-19 Vaccine (5 - Booster for Pfizer series) 11/27/2020   Tetanus Vaccine  12/27/2027   DEXA scan (bone density measurement)  Completed   Hepatitis C Screening: USPSTF Recommendation to screen - Ages 54-79 yo.  Completed   Pneumonia vaccines  Completed   Zoster (Shingles) Vaccine  Completed   HPV Vaccine  Aged Out   Continue yearly flu shots in the Fall (ignore 8/1 date above, Sept/October is fine). DEXA will be due again 11/2021  I'm not aware of any indication for 5th COVID booster as reported above.  You are up to date. Follow the news for change in vaccination recommendations.  Try and avoid taking Aleve and Advil. Use Tylenol arthritis instead, if needed.  You should see the allergist if you end up needing to use your inhalers frequently, for further evaluation, and to ensure that you are using them appropriately.  Try and walk some on 1-2 days/week when you aren't going to the gym (could be indoors, for 15 minutes at a time).  Please cut back on your wine intake.  It isn't recommended along  with your sleeping medication and pain medications.  It also will exacerbate any reflux/heartburn.

## 2020-09-07 ENCOUNTER — Encounter: Payer: Self-pay | Admitting: Family Medicine

## 2020-09-07 ENCOUNTER — Ambulatory Visit (INDEPENDENT_AMBULATORY_CARE_PROVIDER_SITE_OTHER): Payer: PPO | Admitting: Family Medicine

## 2020-09-07 ENCOUNTER — Other Ambulatory Visit: Payer: Self-pay

## 2020-09-07 VITALS — BP 130/78 | HR 68 | Ht 61.0 in | Wt 129.6 lb

## 2020-09-07 DIAGNOSIS — Z5181 Encounter for therapeutic drug level monitoring: Secondary | ICD-10-CM | POA: Diagnosis not present

## 2020-09-07 DIAGNOSIS — E559 Vitamin D deficiency, unspecified: Secondary | ICD-10-CM | POA: Diagnosis not present

## 2020-09-07 DIAGNOSIS — D692 Other nonthrombocytopenic purpura: Secondary | ICD-10-CM | POA: Diagnosis not present

## 2020-09-07 DIAGNOSIS — M8580 Other specified disorders of bone density and structure, unspecified site: Secondary | ICD-10-CM | POA: Diagnosis not present

## 2020-09-07 DIAGNOSIS — I10 Essential (primary) hypertension: Secondary | ICD-10-CM | POA: Diagnosis not present

## 2020-09-07 DIAGNOSIS — Z0000 Encounter for general adult medical examination without abnormal findings: Secondary | ICD-10-CM

## 2020-09-07 DIAGNOSIS — R5383 Other fatigue: Secondary | ICD-10-CM

## 2020-09-07 DIAGNOSIS — K2100 Gastro-esophageal reflux disease with esophagitis, without bleeding: Secondary | ICD-10-CM | POA: Diagnosis not present

## 2020-09-07 MED ORDER — AMLODIPINE BESYLATE 5 MG PO TABS: 5.0000 mg | ORAL_TABLET | Freq: Every day | ORAL | 3 refills | Status: DC

## 2020-09-10 ENCOUNTER — Other Ambulatory Visit: Payer: Self-pay

## 2020-09-10 ENCOUNTER — Other Ambulatory Visit: Payer: PPO

## 2020-09-10 DIAGNOSIS — I10 Essential (primary) hypertension: Secondary | ICD-10-CM | POA: Diagnosis not present

## 2020-09-10 DIAGNOSIS — Z5181 Encounter for therapeutic drug level monitoring: Secondary | ICD-10-CM | POA: Diagnosis not present

## 2020-09-10 DIAGNOSIS — R5383 Other fatigue: Secondary | ICD-10-CM

## 2020-09-11 ENCOUNTER — Encounter: Payer: Self-pay | Admitting: Family Medicine

## 2020-09-11 LAB — CBC WITH DIFFERENTIAL/PLATELET
Basophils Absolute: 0 10*3/uL (ref 0.0–0.2)
Basos: 1 %
EOS (ABSOLUTE): 0.2 10*3/uL (ref 0.0–0.4)
Eos: 3 %
Hematocrit: 38.3 % (ref 34.0–46.6)
Hemoglobin: 12.6 g/dL (ref 11.1–15.9)
Immature Grans (Abs): 0 10*3/uL (ref 0.0–0.1)
Immature Granulocytes: 0 %
Lymphocytes Absolute: 1.7 10*3/uL (ref 0.7–3.1)
Lymphs: 36 %
MCH: 28.8 pg (ref 26.6–33.0)
MCHC: 32.9 g/dL (ref 31.5–35.7)
MCV: 87 fL (ref 79–97)
Monocytes Absolute: 0.4 10*3/uL (ref 0.1–0.9)
Monocytes: 9 %
Neutrophils Absolute: 2.4 10*3/uL (ref 1.4–7.0)
Neutrophils: 51 %
Platelets: 334 10*3/uL (ref 150–450)
RBC: 4.38 x10E6/uL (ref 3.77–5.28)
RDW: 12.2 % (ref 11.7–15.4)
WBC: 4.8 10*3/uL (ref 3.4–10.8)

## 2020-09-11 LAB — COMPREHENSIVE METABOLIC PANEL
ALT: 19 IU/L (ref 0–32)
AST: 22 IU/L (ref 0–40)
Albumin/Globulin Ratio: 2.4 — ABNORMAL HIGH (ref 1.2–2.2)
Albumin: 4.6 g/dL (ref 3.7–4.7)
Alkaline Phosphatase: 81 IU/L (ref 44–121)
BUN/Creatinine Ratio: 28 (ref 12–28)
BUN: 20 mg/dL (ref 8–27)
Bilirubin Total: 0.3 mg/dL (ref 0.0–1.2)
CO2: 25 mmol/L (ref 20–29)
Calcium: 8.7 mg/dL (ref 8.7–10.3)
Chloride: 96 mmol/L (ref 96–106)
Creatinine, Ser: 0.72 mg/dL (ref 0.57–1.00)
Globulin, Total: 1.9 g/dL (ref 1.5–4.5)
Glucose: 98 mg/dL (ref 65–99)
Potassium: 4.5 mmol/L (ref 3.5–5.2)
Sodium: 136 mmol/L (ref 134–144)
Total Protein: 6.5 g/dL (ref 6.0–8.5)
eGFR: 86 mL/min/{1.73_m2} (ref >59)

## 2020-09-11 LAB — TSH: TSH: 2.52 u[IU]/mL (ref 0.450–4.500)

## 2020-10-09 ENCOUNTER — Ambulatory Visit
Admission: RE | Admit: 2020-10-09 | Discharge: 2020-10-09 | Disposition: A | Discharge status: Home / Self Care | Payer: PPO | Source: Ambulatory Visit | Attending: Family Medicine | Admitting: Family Medicine

## 2020-10-09 ENCOUNTER — Other Ambulatory Visit: Payer: Self-pay

## 2020-10-09 DIAGNOSIS — Z1231 Encounter for screening mammogram for malignant neoplasm of breast: Secondary | ICD-10-CM | POA: Diagnosis not present

## 2020-10-22 DIAGNOSIS — I781 Nevus, non-neoplastic: Secondary | ICD-10-CM | POA: Diagnosis not present

## 2020-10-22 DIAGNOSIS — L821 Other seborrheic keratosis: Secondary | ICD-10-CM | POA: Diagnosis not present

## 2020-10-22 DIAGNOSIS — D1801 Hemangioma of skin and subcutaneous tissue: Secondary | ICD-10-CM | POA: Diagnosis not present

## 2020-10-22 DIAGNOSIS — C44319 Basal cell carcinoma of skin of other parts of face: Secondary | ICD-10-CM | POA: Diagnosis not present

## 2020-10-22 DIAGNOSIS — D485 Neoplasm of uncertain behavior of skin: Secondary | ICD-10-CM | POA: Diagnosis not present

## 2020-10-22 DIAGNOSIS — Z411 Encounter for cosmetic surgery: Secondary | ICD-10-CM | POA: Diagnosis not present

## 2020-10-22 DIAGNOSIS — D2330 Other benign neoplasm of skin of unspecified part of face: Secondary | ICD-10-CM | POA: Diagnosis not present

## 2020-11-03 ENCOUNTER — Ambulatory Visit: Payer: PPO | Admitting: Physician Assistant

## 2020-11-03 ENCOUNTER — Encounter: Payer: Self-pay | Admitting: Physician Assistant

## 2020-11-03 VITALS — BP 124/64 | HR 76 | Ht 61.0 in | Wt 129.2 lb

## 2020-11-03 DIAGNOSIS — K648 Other hemorrhoids: Secondary | ICD-10-CM | POA: Diagnosis not present

## 2020-11-03 DIAGNOSIS — K625 Hemorrhage of anus and rectum: Secondary | ICD-10-CM

## 2020-11-03 MED ORDER — HYDROCORTISONE ACETATE 25 MG RE SUPP: 25.0000 mg | Freq: Every day | RECTAL | 4 refills | Status: AC

## 2020-11-03 NOTE — Patient Instructions (Addendum)
If you are age 77 or older, your body mass index should be between 23-30. Your Body mass index is 24.42 kg/m. If this is out of the aforementioned range listed, please consider follow up with your Primary Care Provider. __________________________________________________________  The [facility] providers would like to encourage you to use [facility] to communicate with providers for non-urgent requests or questions.  Due to long hold times on the telephone, sending your provider a message by [facility] may be a faster and more efficient way to get a response.  Please allow 48 business hours for a response.  Please remember that this is for non-urgent requests.   Use Hydrocortisone suppositories 1 suppository at bedtime for 10 days  Increase daily water intake  Use current OTC Laxative daily as needed.  You have been scheduled to see Dr. Hilarie Fredrickson for a hemorrhoid banding on December 31, 2020 at 3:40 pm.  Thank you for entrusting me with your care and choosing [facility].  Amy Esterwood, PA-C

## 2020-11-03 NOTE — Progress Notes (Signed)
L 

## 2020-11-04 NOTE — Progress Notes (Signed)
Subjective:    Patient ID: [name], female    DOB: January 20, 1946, 75 y.o.   MRN: [id number]  HPI [name] is a pleasant 75 year old white female, established with Dr. Hilarie Fredrickson who comes in today with persistent hemorrhoidal symptoms.  She was seen in the office by myself in February 2022 with chronic mild constipation.  She had been noticing small amounts of bright red blood with her bowel movements over the previous several months with some intermittent internal rectal discomfort/irritation.  She expressed sensation of prolapse of hemorrhoids a time with bowel movements or straining.  She was started on Benefiber and Anusol HC suppositories x10 days with repeat course as needed.  We had discussed hemorrhoidal banding, she was hoping to avoid. Last colonoscopy was done in May 2021 for follow-up of adenomatous colon polyps.  She was noted to have multiple sigmoid and left colon diverticuli and small internal hemorrhoids, no recurrent polyps. Today she says that she is continued to have fairly frequent very small volume bright red blood with bowel movements.  She primarily notices a small amount of blood on the tissue but occasionally has seen a small amount of red blood on the stool.  She says she started taking an over-the-counter natural laxative which has been working well and she is having daily bowel movements without straining.  She is also been taking famotidine for GERD symptoms and feels that that may stimulate her bowel as well. She also continues to have some intermittent internal anal rectal discomfort/irritation.  She would like to be set up for hemorrhoidal banding.  Review of Systems Pertinent positive and negative review of systems were noted in the above HPI section.  All other review of systems was otherwise negative.   Outpatient Encounter Medications as of 11/03/2020  Medication Sig   amLODipine (NORVASC) 5 MG tablet Take 1 tablet (5 mg total) by mouth daily.   B Complex-C (B-COMPLEX WITH  VITAMIN C) tablet Take 1 tablet by mouth 2 (two) times a day.   betamethasone valerate ointment (VALISONE) 0.1 % Apply 1 application topically 2 (two) times daily.   Calcium Carbonate-Vit D-Min (CALCIUM 1200 PO) Take 2,400 mg by mouth daily.    famotidine (PEPCID) 40 MG tablet TAKE 1 TABLET (40 MG TOTAL) BY MOUTH 2 (TWO) TIMES DAILY   FLOVENT HFA 110 MCG/ACT inhaler Inhale 2 puffs into the lungs 2 (two) times daily.   HYDROcodone-acetaminophen (NORCO/VICODIN) 5-325 MG tablet Take 0.5 tablets by mouth every 6 (six) hours as needed for moderate pain.   hydrocortisone (ANUSOL-HC) 25 MG suppository Place 1 suppository (25 mg total) rectally at bedtime for 10 days.   lamoTRIgine (LAMICTAL) 150 MG tablet Take 150 mg by mouth at bedtime.   Liniments (SALONPAS PAIN RELIEF PATCH EX) Place 1 patch onto the skin daily as needed (pain).   Multiple Vitamins-Minerals (HAIR SKIN AND NAILS FORMULA PO) Take 1 tablet by mouth daily.    PROAIR HFA 108 (90 Base) MCG/ACT inhaler Inhale 2 puffs into the lungs every 4 (four) hours as needed.   risedronate (ACTONEL) 150 MG tablet Take 1 tablet (150 mg total) by mouth every 30 (thirty) days. with water on empty stomach, nothing by mouth or lie down for next 30 minutes.   tretinoin (RETIN-A) 0.025 % cream Apply 1 application topically daily.   VITAMIN D PO Take 5,000 Units by mouth daily.    zolpidem (AMBIEN) 10 MG tablet Take 2.5 mg by mouth at bedtime.    No facility-administered encounter medications  on file as of 11/03/2020.   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 hearing of left ear 09/29/2016   Fatigue 05/23/2016   Cold intolerance 05/23/2016   Depressive disorder, not elsewhere classified 11/30/2011   Spinal stenosis 11/30/2011   Neck pain on left side 11/30/2011   Asthma 12/05/2007   COUGH 12/05/2007   Social History   Socioeconomic History   Marital status: Married    Spouse name: Not on file   Number of children: 3   Years of education: Not on file   Highest education level: Not on file  Occupational History   Occupation: housewife  Tobacco Use   Smoking status: Former    Years: 1.00    Types: Cigarettes   Smokeless tobacco: Never   Tobacco comments:    college year  Scientific laboratory technician Use: Never used  Substance and Sexual Activity   Alcohol use: Yes    Comment: 5-7 glasses of wine/week   Drug use: No   Sexual activity: Not Currently    Partners: Male    Birth control/protection: Post-menopausal  Other Topics Concern   Not on file  Social History Narrative   Lives with husband. Has 3 kids, 4 grandchildren. Daughter (Kendra--drug rep, lives here in [place])   Other children live in [place]; Son moved to [place]   Social Determinants of Health   Financial Resource Strain: Not on file  Food Insecurity: Not on file  Transportation Needs: Not on file  Physical Activity: Not on file  Stress: Not on file  Social Connections: Not on file  Intimate Partner Violence: Not on file    Ms. Lunsford's family history includes Arthritis in her mother; Breast cancer in her cousin; Depression in her brother and mother; Diabetes in her paternal uncle; Heart attack in her father; Heart disease (age of onset: 38) in her father; Pernicious anemia in her mother.      Objective:    Vitals:   11/03/20 1458  BP: 124/64  Pulse: 76    Physical Exam.Well-developed  well-nourished f eld WF  in no acute distress.  Height, Weight, 129 BMI 24.2  HEENT; nontraumatic normocephalic, EOMI, PE R LA, sclera anicteric. Oropharynx;not done Neck; supple, no JVD Cardiovascular; regular rate and rhythm with S1-S2, no murmur rub or gallop Pulmonary; Clear bilaterally Abdomen; soft, nontender, nondistended, no palpable mass or hepatosplenomegaly, bowel sounds are active Rectal; very small external hemorrhoid, she is tender on digital exam, no fissure palpable, could not tolerate anoscopy today to visualize internal hemorrhoids. Skin; benign exam, no jaundice rash or appreciable lesions Extremities; no clubbing cyanosis or edema skin warm and dry Neuro/Psych; alert and oriented x4, grossly nonfocal mood and affect appropriate        Assessment & Plan:   #74 75 year old white female with previously documented small internal hemorrhoids who continues to be persistently symptomatic with intermittent anal rectal discomfort/irritation and frequent very small volume bright  red blood.  #2 history of adenomatous colon polyps-up-to-date with colonoscopy last done May 2021, no recurrent polyps. #3 history of spinal stenosis 4.  Depression 5.  Hypertension 5.  Asthma #6 GERD-stable  Plan; continue over-the-counter natural laxative on a daily basis Liberal water intake we discussed again at least 60 ounces per day. Continue famotidine 40 mg p.o. twice daily Restart Anusol HC suppositories, advised patient to use for 10 nights consecutively, then if symptoms recur repeat the course. Have scheduled for office follow-up with Dr. Hilarie Fredrickson for hemorrhoidal banding.  Patient feels she can tolerate anoscopy for this procedure, and knows that the hemorrhoids will be assessed for appropriateness for banding at the time of appt    Alfredia Ferguson PA-C 11/04/2020   Cc: [name], MD

## 2020-11-05 NOTE — Progress Notes (Signed)
Addendum: Reviewed and agree with assessment and management plan. Rushi Chasen M, MD  

## 2020-11-17 DIAGNOSIS — Z6841 Body Mass Index (BMI) 40.0 and over, adult: Secondary | ICD-10-CM | POA: Diagnosis not present

## 2020-11-17 DIAGNOSIS — M5440 Lumbago with sciatica, unspecified side: Secondary | ICD-10-CM | POA: Diagnosis not present

## 2020-11-17 DIAGNOSIS — M542 Cervicalgia: Secondary | ICD-10-CM | POA: Diagnosis not present

## 2020-11-25 DIAGNOSIS — M545 Low back pain, unspecified: Secondary | ICD-10-CM | POA: Diagnosis not present

## 2020-11-25 DIAGNOSIS — M542 Cervicalgia: Secondary | ICD-10-CM | POA: Diagnosis not present

## 2020-11-25 DIAGNOSIS — M5440 Lumbago with sciatica, unspecified side: Secondary | ICD-10-CM | POA: Diagnosis not present

## 2020-12-08 DIAGNOSIS — M5440 Lumbago with sciatica, unspecified side: Secondary | ICD-10-CM | POA: Diagnosis not present

## 2020-12-08 DIAGNOSIS — H903 Sensorineural hearing loss, bilateral: Secondary | ICD-10-CM | POA: Diagnosis not present

## 2020-12-08 DIAGNOSIS — Z822 Family history of deafness and hearing loss: Secondary | ICD-10-CM | POA: Diagnosis not present

## 2020-12-08 DIAGNOSIS — M542 Cervicalgia: Secondary | ICD-10-CM | POA: Diagnosis not present

## 2020-12-16 ENCOUNTER — Other Ambulatory Visit: Payer: Self-pay | Admitting: Internal Medicine

## 2020-12-16 DIAGNOSIS — K209 Esophagitis, unspecified without bleeding: Secondary | ICD-10-CM

## 2020-12-16 DIAGNOSIS — K219 Gastro-esophageal reflux disease without esophagitis: Secondary | ICD-10-CM

## 2020-12-21 ENCOUNTER — Ambulatory Visit: Payer: PPO | Admitting: Obstetrics and Gynecology

## 2020-12-21 NOTE — Progress Notes (Deleted)
75 y.o. G54P3003 Married White or Caucasian Not Hispanic or Latino female here for breast and pelvic.      Patient's last menstrual period was 08/15/1988 (exact date).          Sexually active: {yes no:314532}  The current method of family planning is {contraception:315051}.    Exercising: {yes no:314532}  {types:19826} Smoker:  {YES [id number]  Health Maintenance: Pap:  04/15/14 WNL History of abnormal Pap:  no MMG:  10/13/20 density B Bi-rads 1 neg  BMD:   10/12/21osteopenic, T score -2.4. FRAX 15.1%/4.6% Colonoscopy: 07/17/19 no repeat due to age. TDaP:  12/26/17  Gardasil: n/a   reports that she has quit smoking. Her smoking use included cigarettes. She has never used smokeless tobacco. She reports current alcohol use. She reports that she does not use drugs.  Past Medical History:  Diagnosis Date   Anxiety    sees [facility]be [place], [place])   Asthma    Colon polyps 08/2014   tubular adenoma and hyperplastic polyps (Dr. Hilarie Fredrickson)   DDD (degenerative disc disease), cervical dr Patrice Paradise   s/p surgery, ongoing pain   Depression    h/o hospitalization with ECT   Diverticulosis    Esophagitis    Hiatal hernia    History of insomnia    Hypertension    Internal hemorrhoids    Neck pain    Osteopenia stopped fosamax ~2006   Post-operative nausea and vomiting    Vertigo    Vitamin D deficiency borderline(28)-2010   treated by her GYN    Past Surgical History:  Procedure Laterality Date   CATARACT EXTRACTION, BILATERAL Bilateral Sept/October 2019   HEMORRHOID SURGERY  2006, 12/2014   KNEE ARTHROSCOPY Right    LASIK     Dr. Lucita Ferrara   NASAL SEPTOPLASTY W/ TURBINOPLASTY  1986   NECK SURGERY  02/2010([facility])1/03,2005([facility])   REDUCTION MAMMAPLASTY Bilateral 07/24/2008   TONSILLECTOMY     TOTAL HIP ARTHROPLASTY Left 08/2007       TOTAL SHOULDER ARTHROPLASTY Left 08/30/2018   Procedure: TOTAL SHOULDER ARTHROPLASTY;  Surgeon: Justice Britain, MD;  Location: [facility];  Service:  Orthopedics;  Laterality: Left;  136min, move scope to 12:30pm    Current Outpatient Medications  Medication Sig Dispense Refill   amLODipine (NORVASC) 5 MG tablet Take 1 tablet (5 mg total) by mouth daily. 90 tablet 3   B Complex-C (B-COMPLEX WITH VITAMIN C) tablet Take 1 tablet by mouth 2 (two) times a day.     betamethasone valerate ointment (VALISONE) 0.1 % Apply 1 application topically 2 (two) times daily. 30 g 0   Calcium Carbonate-Vit D-Min (CALCIUM 1200 PO) Take 2,400 mg by mouth daily.      famotidine (PEPCID) 40 MG tablet TAKE 1 TABLET BY MOUTH TWICE A DAY 180 tablet 1   FLOVENT HFA 110 MCG/ACT inhaler Inhale 2 puffs into the lungs 2 (two) times daily.     HYDROcodone-acetaminophen (NORCO/VICODIN) 5-325 MG tablet Take 0.5 tablets by mouth every 6 (six) hours as needed for moderate pain.     lamoTRIgine (LAMICTAL) 150 MG tablet Take 150 mg by mouth at bedtime.     Liniments (SALONPAS PAIN RELIEF PATCH EX) Place 1 patch onto the skin daily as needed (pain).     Multiple Vitamins-Minerals (HAIR SKIN AND NAILS FORMULA PO) Take 1 tablet by mouth daily.      PROAIR HFA 108 (90 Base) MCG/ACT inhaler Inhale 2 puffs into the lungs every 4 (four) hours as needed.  risedronate (ACTONEL) 150 MG tablet Take 1 tablet (150 mg total) by mouth every 30 (thirty) days. with water on empty stomach, nothing by mouth or lie down for next 30 minutes. 4 tablet 1   tretinoin (RETIN-A) 0.025 % cream Apply 1 application topically daily.     VITAMIN D PO Take 5,000 Units by mouth daily.      zolpidem (AMBIEN) 10 MG tablet Take 2.5 mg by mouth at bedtime.      No current facility-administered medications for this visit.    Family History  Problem Relation Age of Onset   Arthritis Mother    Depression Mother    Pernicious anemia Mother    Heart disease Father 51   Heart attack Father    Depression Brother    Diabetes Paternal Uncle    Breast cancer Cousin        paternal   Colon cancer Neg Hx     Rectal cancer Neg Hx    Stomach cancer Neg Hx     Review of Systems  Exam:   LMP 08/15/1988 (Exact Date)   Weight change: @WEIGHTCHANGE @ Height:      Ht Readings from Last 3 Encounters:  11/03/20 5\' 1"  (1.549 m)  09/07/20 5\' 1"  (1.549 m)  06/03/20 5\' 1"  (1.549 m)    General appearance: alert, cooperative and appears stated age Head: Normocephalic, without obvious abnormality, atraumatic Neck: no adenopathy, supple, symmetrical, trachea midline and thyroid {CHL AMB PHY EX THYROID NORM DEFAULT:[id number]::"normal to inspection and palpation"} Lungs: clear to auscultation bilaterally Cardiovascular: regular rate and rhythm Breasts: {Exam; breast:13139::"normal appearance, no masses or tenderness"} Abdomen: soft, non-tender; non distended,  no masses,  no organomegaly Extremities: extremities normal, atraumatic, no cyanosis or edema Skin: Skin color, texture, turgor normal. No rashes or lesions Lymph nodes: Cervical, supraclavicular, and axillary nodes normal. No abnormal inguinal nodes palpated Neurologic: Grossly normal   Pelvic: External genitalia:  no lesions              Urethra:  normal appearing urethra with no masses, tenderness or lesions              Bartholins and Skenes: normal                 Vagina: normal appearing vagina with normal color and discharge, no lesions              Cervix: {CHL AMB PHY EX CERVIX NORM DEFAULT:[id number]::"no lesions"}               Bimanual Exam:  Uterus:  {CHL AMB PHY EX UTERUS NORM DEFAULT:[phone]::"normal size, contour, position, consistency, mobility, non-tender"}              Adnexa: {CHL AMB PHY EX ADNEXA NO MASS [phone]::"no mass, fullness, tenderness"}               Rectovaginal: Confirms               Anus:  normal sphincter tone, no lesions  *** chaperoned for the exam.  A:  Well Woman with normal exam  P:

## 2020-12-23 DIAGNOSIS — C44319 Basal cell carcinoma of skin of other parts of face: Secondary | ICD-10-CM | POA: Diagnosis not present

## 2020-12-23 DIAGNOSIS — D23112 Other benign neoplasm of skin of right lower eyelid, including canthus: Secondary | ICD-10-CM | POA: Diagnosis not present

## 2020-12-28 ENCOUNTER — Other Ambulatory Visit: Payer: Self-pay | Admitting: Family Medicine

## 2020-12-28 DIAGNOSIS — H903 Sensorineural hearing loss, bilateral: Secondary | ICD-10-CM | POA: Diagnosis not present

## 2020-12-28 DIAGNOSIS — M8580 Other specified disorders of bone density and structure, unspecified site: Secondary | ICD-10-CM

## 2020-12-29 HISTORY — PX: HEMORRHOID BANDING: SHX5850

## 2020-12-31 ENCOUNTER — Encounter: Payer: Self-pay | Admitting: Internal Medicine

## 2020-12-31 ENCOUNTER — Ambulatory Visit: Payer: PPO | Admitting: Internal Medicine

## 2020-12-31 VITALS — BP 108/64 | HR 52 | Ht 61.0 in | Wt 129.0 lb

## 2020-12-31 DIAGNOSIS — K648 Other hemorrhoids: Secondary | ICD-10-CM

## 2020-12-31 NOTE — Patient Instructions (Signed)
If you are age 77 or older, your body mass index should be between 23-30. Your Body mass index is 24.37 kg/m. If this is out of the aforementioned range listed, please consider follow up with your Primary Care Provider.  If you are age 1 or younger, your body mass index should be between 19-25. Your Body mass index is 24.37 kg/m. If this is out of the aformentioned range listed, please consider follow up with your Primary Care Provider.   HEMORRHOID BANDING PROCEDURE    FOLLOW-UP CARE   The procedure you have had should have been relatively painless since the banding of the area involved does not have nerve endings and there is no pain sensation.  The rubber band cuts off the blood supply to the hemorrhoid and the band may fall off as soon as 48 hours after the banding (the band may occasionally be seen in the toilet bowl following a bowel movement). You may notice a temporary feeling of fullness in the rectum which should respond adequately to plain Tylenol or Motrin.  Following the banding, avoid strenuous exercise that evening and resume full activity the next day.  A sitz bath (soaking in a warm tub) or bidet is soothing, and can be useful for cleansing the area after bowel movements.     To avoid constipation, take two tablespoons of natural wheat bran, natural oat bran, flax, Benefiber or any over the counter fiber supplement and increase your water intake to 7-8 glasses daily.    Unless you have been prescribed anorectal medication, do not put anything inside your rectum for two weeks: No suppositories, enemas, fingers, etc.  Occasionally, you may have more bleeding than usual after the banding procedure.  This is often from the untreated hemorrhoids rather than the treated one.  Don't be concerned if there is a tablespoon or so of blood.  If there is more blood than this, lie flat with your bottom higher than your head and apply an ice pack to the area. If the bleeding does not stop  within a half an hour or if you feel faint, call our office at [phone] or go to the emergency room.  Problems are not common; however, if there is a substantial amount of bleeding, severe pain, chills, fever or difficulty passing urine (very rare) or other problems, you should call us at [phone] or report to the nearest emergency room.  Do not stay seated continuously for more than 2-3 hours for a day or two after the procedure.  Tighten your buttock muscles 10-15 times every two hours and take 10-15 deep breaths every 1-2 hours.  Do not spend more than a few minutes on the toilet if you cannot empty your bowel; instead re-visit the toilet at a later time.   The [place] providers would like to encourage you to use [facility] to communicate with providers for non-urgent requests or questions.  Due to long hold times on the telephone, sending your provider a message by [facility] may be a faster and more efficient way to get a response.  Please allow 48 business hours for a response.  Please remember that this is for non-urgent requests.   It was a pleasure to see you today!  Thank you for trusting me with your gastrointestinal care!    Zenovia Jarred, MD

## 2020-12-31 NOTE — Progress Notes (Signed)
[name] is a 75 year old female with symptomatic bleeding internal hemorrhoids who presents for hemorrhoidal banding.  She was seen by Nicoletta Ba on 11/03/2020 for the same. Up-to-date with surveillance colonoscopy last having colonoscopy in May 2021  Symptoms prior to banding: Painless bleeding with each bowel movement, perianal itching and at times burning.  She is using an over-the-counter laxative 2 at bedtime which makes bowel movements regular, daily, not hard and easy to pass   PROCEDURE NOTE:  ANOSCOPY: Using a disposable, lubricated, slotted, self-illuminating anoscope, the rectum was intubated without difficulty. The trochar was removed and the ano-rectum was circumferentially inspected. There were internal hemorrhoids, LL>RA=RP. There was no finding of an anorectal fissure. The rectal mucosa was not inflamed. No neoplasia or other pathology was identified. The inspection was well tolerated.    The patient presents with symptomatic grade 2 internal hemorrhoids, requesting rubber band ligation of her hemorrhoidal disease.  All risks, benefits and alternative forms of therapy were described and informed consent was obtained.   The anorectum was pre-medicated with 0.125% nitroglycerin ointment The decision was made to band the LL internal hemorrhoid, and the Levy was used to perform band ligation without complication.   Digital anorectal examination was then performed to assure proper positioning of the band, and to adjust the banded tissue as required.  The patient was discharged home without pain or other issues.  Dietary and behavioral recommendations were given and along with follow-up instructions.    The patient will return as scheduled for follow-up and possible additional banding as required. No complications were encountered and the patient tolerated the procedure well.

## 2021-01-01 ENCOUNTER — Encounter: Payer: Self-pay | Admitting: Family Medicine

## 2021-01-04 ENCOUNTER — Ambulatory Visit (INDEPENDENT_AMBULATORY_CARE_PROVIDER_SITE_OTHER): Payer: PPO | Admitting: Obstetrics and Gynecology

## 2021-01-04 ENCOUNTER — Other Ambulatory Visit: Payer: Self-pay

## 2021-01-04 ENCOUNTER — Encounter: Payer: Self-pay | Admitting: Obstetrics and Gynecology

## 2021-01-04 VITALS — BP 120/76 | HR 77 | Ht 60.63 in | Wt 128.0 lb

## 2021-01-04 DIAGNOSIS — N816 Rectocele: Secondary | ICD-10-CM | POA: Diagnosis not present

## 2021-01-04 DIAGNOSIS — L309 Dermatitis, unspecified: Secondary | ICD-10-CM

## 2021-01-04 DIAGNOSIS — N763 Subacute and chronic vulvitis: Secondary | ICD-10-CM

## 2021-01-04 DIAGNOSIS — Z87898 Personal history of other specified conditions: Secondary | ICD-10-CM | POA: Insufficient documentation

## 2021-01-04 DIAGNOSIS — C4491 Basal cell carcinoma of skin, unspecified: Secondary | ICD-10-CM | POA: Insufficient documentation

## 2021-01-04 DIAGNOSIS — Z01411 Encounter for gynecological examination (general) (routine) with abnormal findings: Secondary | ICD-10-CM | POA: Diagnosis not present

## 2021-01-04 LAB — WET PREP FOR TRICH, YEAST, CLUE

## 2021-01-04 MED ORDER — CLOBETASOL PROPIONATE 0.05 % EX OINT: TOPICAL_OINTMENT | CUTANEOUS | 0 refills | Status: DC

## 2021-01-04 NOTE — Patient Instructions (Signed)

## 2021-01-04 NOTE — Progress Notes (Signed)
75 y.o. [id number] Married White or Caucasian Not Hispanic or Latino female here for breast and pelvic exam.  No vaginal bleeding.   She was seen with chronic vulvitis in 4/22. Initially she got better with steroid ointment. She has been having recurring symptoms. She feels raw and itchy. Burns externally when she voids. She tried betamethasone ointment no real help. She uses Vaseline also coconut oil.   H/O rectocele, starting to bother her. She has trouble emptying, often has 3 BM's in a row. She has a h/o constipation, better with medication. She has a BM daily, not currently straining. She had a hemorrhoid banded last week. She can handle the rectocele.   No urinary c/o other than the external dysuria. Up to void 1 x a night.   H/O osteopenia with elevated risk of fracture. DEXA with primary.     Patient's last menstrual period was 08/15/1988 (exact date).          Sexually active: No.  The current method of family planning is post menopausal status.    Exercising: Yes.     Goes to the gym.  Smoker:  no  Health Maintenance: Pap:  04/15/14- WNL History of abnormal Pap:  no MMG:  10/09/20- birads 1 neg  BMD:   12/10/19- osteopenic -2.4 Colonoscopy: 07/17/19- no repeat due to age  TDaP:  12/26/2017 Gardasil: n/a   reports that she has quit smoking. Her smoking use included cigarettes. She has never used smokeless tobacco. She reports current alcohol use. She reports that she does not use drugs. 1 drink a night. She has 3 kids, and 4 grandchildren.  Past Medical History:  Diagnosis Date   Anxiety    sees [facility], [place])   Asthma    Colon polyps 08/2014   tubular adenoma and hyperplastic polyps (Dr. Hilarie Fredrickson)   DDD (degenerative disc disease), cervical dr Patrice Paradise   s/p surgery, ongoing pain   Depression    h/o hospitalization with ECT   Diverticulosis    Esophagitis    Hiatal hernia    History of insomnia    Hypertension    Internal hemorrhoids    Neck pain     Osteopenia stopped fosamax ~2006   Post-operative nausea and vomiting    Vertigo    Vitamin D deficiency borderline(28)-2010   treated by her GYN    Past Surgical History:  Procedure Laterality Date   CATARACT EXTRACTION, BILATERAL Bilateral Sept/October 2019   HEMORRHOID BANDING  12/2020   Dr. Hilarie Fredrickson   HEMORRHOID SURGERY  2006, 12/2014   KNEE ARTHROSCOPY Right    LASIK     Dr. Lucita Ferrara   NASAL SEPTOPLASTY W/ TURBINOPLASTY  1986   NECK SURGERY  02/2010([facility])1/03,2005([facility])   REDUCTION MAMMAPLASTY Bilateral 07/24/2008   TONSILLECTOMY     TOTAL HIP ARTHROPLASTY Left 08/2007       TOTAL SHOULDER ARTHROPLASTY Left 08/30/2018   Procedure: TOTAL SHOULDER ARTHROPLASTY;  Surgeon: Justice Britain, MD;  Location: [facility];  Service: Orthopedics;  Laterality: Left;  163min, move scope to 12:30pm    Current Outpatient Medications  Medication Sig Dispense Refill   amLODipine (NORVASC) 5 MG tablet Take 1 tablet (5 mg total) by mouth daily. 90 tablet 3   B Complex-C (B-COMPLEX WITH VITAMIN C) tablet Take 1 tablet by mouth 2 (two) times a day.     betamethasone valerate ointment (VALISONE) 0.1 % Apply 1 application topically 2 (two) times daily. 30 g 0   Calcium Carbonate-Vit D-Min (CALCIUM 1200  PO) Take 2,400 mg by mouth daily.      famotidine (PEPCID) 40 MG tablet TAKE 1 TABLET BY MOUTH TWICE A DAY 180 tablet 1   FLOVENT HFA 110 MCG/ACT inhaler Inhale 2 puffs into the lungs 2 (two) times daily.     lamoTRIgine (LAMICTAL) 150 MG tablet Take 150 mg by mouth at bedtime.     Liniments (SALONPAS PAIN RELIEF PATCH EX) Place 1 patch onto the skin daily as needed (pain).     Multiple Vitamins-Minerals (HAIR SKIN AND NAILS FORMULA PO) Take 1 tablet by mouth daily.      PROAIR HFA 108 (90 Base) MCG/ACT inhaler Inhale 2 puffs into the lungs every 4 (four) hours as needed.     risedronate (ACTONEL) 150 MG tablet TAKE 1 TABLET EVERY 30 DAYS WITH WATER ON EMPTY STOMACH, NOTHING BY MOUTH OR LIE DOWN FOR 30  MINUTES 3 tablet 3   tretinoin (RETIN-A) 0.025 % cream Apply 1 application topically daily.     VITAMIN D PO Take 5,000 Units by mouth daily.      zolpidem (AMBIEN) 10 MG tablet Take 2.5 mg by mouth at bedtime.      HYDROcodone-acetaminophen (NORCO/VICODIN) 5-325 MG tablet Take 0.5 tablets by mouth every 6 (six) hours as needed for moderate pain.     meloxicam (MOBIC) 7.5 MG tablet Take 7.5 mg by mouth 2 (two) times daily.     No current facility-administered medications for this visit.    Family History  Problem Relation Age of Onset   Arthritis Mother    Depression Mother    Pernicious anemia Mother    Heart disease Father 30   Heart attack Father    Depression Brother    Diabetes Paternal Uncle    Breast cancer Cousin        paternal   Colon cancer Neg Hx    Rectal cancer Neg Hx    Stomach cancer Neg Hx    Pancreatic disease Neg Hx    Esophageal cancer Neg Hx     Review of Systems  Genitourinary:  Positive for vaginal pain.  All other systems reviewed and are negative.  Exam:   BP 120/76   Pulse 77   Ht 5' 0.63" (1.54 m)   Wt 128 lb (58.1 kg)   LMP 08/15/1988 (Exact Date)   SpO2 99%   BMI 24.48 kg/m   Weight change: @WEIGHTCHANGE @ Height:   Height: 5' 0.63" (154 cm)  Ht Readings from Last 3 Encounters:  01/04/21 5' 0.63" (1.54 m)  12/31/20 5\' 1"  (1.549 m)  11/03/20 5\' 1"  (1.549 m)    General appearance: alert, cooperative and appears stated age Head: Normocephalic, without obvious abnormality, atraumatic Neck: no adenopathy, supple, symmetrical, trachea midline and thyroid normal to inspection and palpation Breasts: normal appearance, no masses or tenderness Abdomen: soft, non-tender; non distended,  no masses,  no organomegaly Extremities: extremities normal, atraumatic, no cyanosis or edema Skin: Skin color, texture, turgor normal. No rashes or lesions Lymph nodes: Cervical, supraclavicular, and axillary nodes normal. No abnormal inguinal nodes  palpated Neurologic: Grossly normal   Pelvic: External genitalia:  patches of erythema on the inner labia minora bilaterally. Mild whitening on the labia majora and labia minora. No fissures or lesions. Some loss of architecture.               Urethra:  normal appearing urethra with no masses, tenderness or lesions  Bartholins and Skenes: normal                 Vagina: atrophic appearing vagina without discharge. Small grade 2 rectocele              Cervix: absent               Bimanual Exam:  Uterus:  uterus absent              Adnexa: no mass, fullness, tenderness               Rectovaginal: deferred (just had hemorrhoid banding)  Gae Dry chaperoned for the exam.  1. Encounter for gynecological examination with abnormal finding No pap needed Mammogram and colonoscopy UTD DEXA and lab work with primary canre  2. Chronic vulvitis -Discussed skin care, she is already aware -Wet prep: negative -Sureswab sent - clobetasol ointment (TEMOVATE) 0.05 %; Apply a small amount topically BID x 2 weeks  Dispense: 30 g; Refill: 0  3. Perianal dermatitis Discussed vulvar/perianal skin care - clobetasol ointment (TEMOVATE) 0.05 %; Apply a small amount topically BID x 2 weeks  Dispense: 30 g; Refill: 0  4. Rectocele Tolerable.

## 2021-01-05 ENCOUNTER — Other Ambulatory Visit: Payer: Self-pay | Admitting: *Deleted

## 2021-01-05 ENCOUNTER — Telehealth: Payer: Self-pay

## 2021-01-05 DIAGNOSIS — L309 Dermatitis, unspecified: Secondary | ICD-10-CM

## 2021-01-05 DIAGNOSIS — N763 Subacute and chronic vulvitis: Secondary | ICD-10-CM

## 2021-01-05 MED ORDER — CLOBETASOL PROPIONATE 0.05 % EX OINT: TOPICAL_OINTMENT | CUTANEOUS | 0 refills | Status: DC

## 2021-01-05 NOTE — Telephone Encounter (Signed)
Left message for patient to call me back with which [place] location she prefers.

## 2021-01-05 NOTE — Telephone Encounter (Signed)
Rx sent to [facility].

## 2021-01-05 NOTE — Telephone Encounter (Signed)
Patient called because she picked up the generic Temovate Cream prescribed yesterday and it costed $100.  She looked in her prescription plan and she can get it at [facility] for $20.   She was able to return it to [facility] for refund.

## 2021-01-05 NOTE — Telephone Encounter (Signed)
01/06/21 call from patient who states she prefers Paediatric nurse at [address] [place], [place] [address] for her Rx. Patient also states she is going out of town and needs to pick up the Rx within 1 hour.

## 2021-01-06 LAB — SURESWAB® ADVANCED VAGINITIS,TMA
CANDIDA SPECIES: NOT DETECTED
Candida glabrata: NOT DETECTED
SURESWAB(R) ADV BACTERIAL VAGINOSIS(BV),TMA: NEGATIVE
TRICHOMONAS VAGINALIS (TV),TMA: NOT DETECTED

## 2021-01-14 DIAGNOSIS — G894 Chronic pain syndrome: Secondary | ICD-10-CM | POA: Diagnosis not present

## 2021-01-14 DIAGNOSIS — M4322 Fusion of spine, cervical region: Secondary | ICD-10-CM | POA: Diagnosis not present

## 2021-01-14 DIAGNOSIS — M47812 Spondylosis without myelopathy or radiculopathy, cervical region: Secondary | ICD-10-CM | POA: Diagnosis not present

## 2021-01-18 ENCOUNTER — Ambulatory Visit: Payer: PPO | Admitting: Obstetrics and Gynecology

## 2021-01-26 ENCOUNTER — Ambulatory Visit: Payer: PPO | Admitting: Obstetrics and Gynecology

## 2021-01-31 DIAGNOSIS — J40 Bronchitis, not specified as acute or chronic: Secondary | ICD-10-CM | POA: Diagnosis not present

## 2021-02-02 ENCOUNTER — Telehealth: Payer: Self-pay | Admitting: Family Medicine

## 2021-02-02 NOTE — Progress Notes (Signed)
  Chronic Care Management   Outreach Note  02/02/2021 Name: [name] MRN: [id number] DOB: [birth year]  Referred by: [name], MD Reason for referral : No chief complaint on file.   An unsuccessful telephone outreach was attempted today. The patient was referred to the pharmacist for assistance with care management and care coordination.   Follow Up Plan:   Tatjana Dellinger Upstream Scheduler

## 2021-02-08 ENCOUNTER — Telehealth: Payer: Self-pay | Admitting: Internal Medicine

## 2021-02-08 ENCOUNTER — Telehealth (INDEPENDENT_AMBULATORY_CARE_PROVIDER_SITE_OTHER): Payer: PPO | Admitting: Family Medicine

## 2021-02-08 ENCOUNTER — Encounter: Payer: Self-pay | Admitting: Family Medicine

## 2021-02-08 ENCOUNTER — Other Ambulatory Visit: Payer: Self-pay

## 2021-02-08 VITALS — BP 138/74 | Temp 98.5°F | Ht 61.0 in | Wt 123.0 lb

## 2021-02-08 DIAGNOSIS — J4520 Mild intermittent asthma, uncomplicated: Secondary | ICD-10-CM | POA: Diagnosis not present

## 2021-02-08 DIAGNOSIS — J309 Allergic rhinitis, unspecified: Secondary | ICD-10-CM | POA: Diagnosis not present

## 2021-02-08 DIAGNOSIS — R059 Cough, unspecified: Secondary | ICD-10-CM

## 2021-02-08 NOTE — Telephone Encounter (Signed)
Patient states after having her banding she is having really bad, states she has suppository's and seeking if there is anything else she can do before she comes in on the 3rd. Please advise.

## 2021-02-08 NOTE — Telephone Encounter (Signed)
Called patient to let her know recommendations. Scheduled patient with Agnes Lawrence on Friday 02/12/21 at 1:30 pm. Pt verbalized understanding and had no other concerns at end of call.

## 2021-02-08 NOTE — Telephone Encounter (Signed)
Sounds like either prolapsed internal hemorrhoid or perhaps a thrombosed external hemorrhoid.  Given pain, external hemorrhoid may be more likely which is typically not treated with banding Sometimes external hemorrhoids require incision and drainage In the meantime agree with hydrocortisone suppository 25 mg nightly for 3-5 nights RectiCare over-the-counter can be helpful for distal rectal and perianal pain.  This would be used per box/tube instructions Sitz bath's Tylenol 1 g up to 4 times a day as needed We can see if an APP visit is available this week She should keep me updated on her symptoms

## 2021-02-08 NOTE — Telephone Encounter (Signed)
Spoke with patient and she said that after her banding her pain improved but it has recently returned. She said that her rectum feels irritated and it hurts when she has a BM. She also has a small amount of bleeding with bowel movements's. She said its almost like her rectum is protruding or she thinks she may have another hemorrhoid. She said she hasn't used the suppositories recently but she knows she has 6 left. Pt is scheduled for an appointment with Dr. Hilarie Fredrickson on 03/02/21. Please advise.

## 2021-02-08 NOTE — Progress Notes (Signed)
Start time: 1:57 End time: 2:15  Virtual Visit via Telephone Note  I connected with [name] on 02/08/21 by telephone and verified that I am speaking with the correct person using two identifiers. Patient reported inability to do video visit.  Location: Patient: home Provider: office   I discussed the limitations of evaluation and management by telemedicine and the availability of in person appointments. The patient expressed understanding and agreed to proceed.  History of Present Illness:  Chief Complaint  Patient presents with   Cough    PHONE CALL cough and congestion x 2 weeks. No fever chills or body aches. Cough is only slightly productive and mucus is yellow. Very runny nose. Has not done any home covid tests over the last two weeks. Is very tired-coughing is worse at night. Went [facility] Saturday 12/3 and was given prednisone and cough syrup, no abx.   Symptoms started 11/28 with cough and congestion. She went to [facility] 12/3.  She was told her flu and COVID tests were negative.  She was treated with prednisone and hydrocodone cough syrup. She states she think she got somewhat better, not completely, but cough got worse again yesterday.    She doesn't feel horrible, just has some discomfort in her chest and a dry, hacky cough.  She has a runny nose--illness started with a lot of discolored mucus.  Today is just a little runny, clear mucus, a little stuffy. She denies sore throat, ear pain. She has a small amount of phlegm just at night, very slightly yellow/creamy. This isn't very thick. She has a h/o bronchitis.  She denies shortness of breath. Has pain from coughing (in chest). Used inhaler this morning--Flovent.  She has been using it twice daily since she got sick. She hasn't been using ProAir inhaler.  She used Vick's on her chest, used some OTC Robitussin (just at night), and using cough lozenges (which helped).  She finished the Tussionex syrup.   PMH, PSH, SH  reviewed  Outpatient Encounter Medications as of 02/08/2021  Medication Sig Note   amLODipine (NORVASC) 5 MG tablet Take 1 tablet (5 mg total) by mouth daily.    B Complex-C (B-COMPLEX WITH VITAMIN C) tablet Take 1 tablet by mouth 2 (two) times a day. 08/27/2018: Takes B-complex 100 once daily   Calcium Carbonate-Vit D-Min (CALCIUM 1200 PO) Take 2,400 mg by mouth daily.  02/27/2019: Unsure of dose, takes 2 together.   famotidine (PEPCID) 40 MG tablet TAKE 1 TABLET BY MOUTH TWICE A DAY    FLOVENT HFA 110 MCG/ACT inhaler Inhale 2 puffs into the lungs 2 (two) times daily.    HYDROcodone-acetaminophen (NORCO/VICODIN) 5-325 MG tablet Take 0.5 tablets by mouth every 6 (six) hours as needed for moderate pain. 12/18/2019: Taking 1/2 tablet 3-4x/week lately   meloxicam (MOBIC) 7.5 MG tablet Take 7.5 mg by mouth 2 (two) times daily. 02/08/2021: Uses for arthirits   Multiple Vitamins-Minerals (EMERGEN-C IMMUNE) PACK Take by mouth. 02/08/2021: Powder-daily   Multiple Vitamins-Minerals (HAIR SKIN AND NAILS FORMULA PO) Take 1 tablet by mouth daily.     risedronate (ACTONEL) 150 MG tablet TAKE 1 TABLET EVERY 30 DAYS WITH WATER ON EMPTY STOMACH, NOTHING BY MOUTH OR LIE DOWN FOR 30 MINUTES    tretinoin (RETIN-A) 0.025 % cream Apply 1 application topically daily.    VITAMIN D PO Take 5,000 Units by mouth daily.     zolpidem (AMBIEN) 10 MG tablet Take 2.5 mg by mouth at bedtime.  02/08/2021: Took last  night   chlorpheniramine-HYDROcodone (TUSSIONEX) 10-8 MG/5ML SUER SMARTSIG:5 Milliliter(s) By Mouth Every 12 Hours PRN (Patient not taking: Reported on 02/08/2021) 02/08/2021: finished   clobetasol ointment (TEMOVATE) 0.05 % Apply a small amount topically BID x 2 weeks (Patient not taking: Reported on 02/08/2021)    lamoTRIgine (LAMICTAL) 150 MG tablet Take 150 mg by mouth at bedtime.    Liniments (SALONPAS PAIN RELIEF PATCH EX) Place 1 patch onto the skin daily as needed (pain). (Patient not taking: Reported on  02/08/2021) 02/08/2021: As needed   predniSONE (DELTASONE) 10 MG tablet Take by mouth. (Patient not taking: Reported on 02/08/2021) 02/08/2021: finished   PROAIR HFA 108 (90 Base) MCG/ACT inhaler Inhale 2 puffs into the lungs every 4 (four) hours as needed. (Patient not taking: Reported on 02/08/2021)    No facility-administered encounter medications on file as of 02/08/2021.   Allergies  Allergen Reactions   Cymbalta [Duloxetine Hcl]     Abdominal pain   Nitrofurantoin [Macrodantin]    Sulfamethoxazole Hives   Cefdinir Rash    ROS: no fever, chills, shortness of breath, n/v/d.  +cough and runny nose per HPI.  No myalgias. No sinus pain. No rashes or other concerns. See HPI   Observations/Objective:  BP 138/74   Temp 98.5 F (36.9 C) (Temporal)   Ht 5\' 1"  (1.549 m)   Wt 123 lb (55.8 kg)   LMP 08/15/1988 (Exact Date)   BMI 23.24 kg/m   Patient is alert and oriented, with normal speech. She doesn't sound particularly congested, and there is no coughing or throat-clearing noted during visit. She sounds comfortable, in no distress Exam is limited due to virtual nature of the visit   Assessment and Plan:  Cough, unspecified type - Recommended adding Mucinex DM. To contact us if shortness of breath, fever, discolored mucus/phlegm or other concerns develop  Mild intermittent asthma, unspecified whether complicated - cont flovent, can try albuterol prn when having spasms of dry cough  Allergic rhinitis, unspecified seasonality, unspecified trigger - Pt adviesd to start zyrtec daily  Symptoms overall improved--some persistent cough likely related to PND, allergies and asthma, poss some post-viral component. I'd like to avoid prednisone and narcotic cough syrups for now (pt takes oral pain meds) Declined tessalon, not helpful in the past. Mucinex DM and zyrtec should help. F/u if symptoms persist/worsen  AVS was mailed to pt.   Stay well hydrated. Take Mucinex DM 12 hour  tablets, take them twice daily.  This will help keep secretions thin, and has a 12 hour cough suppressant (so you won't wake up in the middle of the night when it wears off). Take Zyrtec once daily--this will help dry up the runny nose (which  might also prevent some of the coughing).  Continue to use your Flovent twice daily.  Use the ProAir if needed for any wheezing, or spells of coughing.  Continue lozenges, tea with honey, Vick's.  If you develop more discolored mucus or phlegm, if you start with fevers, or worsening shortness of breath or pain with breathing, please contact us.  I hope you feel better soon!    Follow Up Instructions:    I discussed the assessment and treatment plan with the patient. The patient was provided an opportunity to ask questions and all were answered. The patient agreed with the plan and demonstrated an understanding of the instructions.   The patient was advised to call back or seek an in-person evaluation if the symptoms worsen or if the condition fails  to improve as anticipated.  I spent 21 minutes dedicated to the care of this patient, including pre-visit review of records, face to face time, post-visit ordering of testing and documentation.    Vikki Ports, MD

## 2021-02-08 NOTE — Patient Instructions (Signed)
Stay well hydrated. Take Mucinex DM 12 hour tablets, take them twice daily.  This will help keep secretions thin, and has a 12 hour cough suppressant (so you won't wake up in the middle of the night when it wears off). Take Zyrtec once daily--this will help dry up the runny nose (which  might also prevent some of the coughing).  Continue to use your Flovent twice daily.  Use the ProAir if needed for any wheezing, or spells of coughing.  Continue lozenges, tea with honey, Vick's.  If you develop more discolored mucus or phlegm, if you start with fevers, or worsening shortness of breath or pain with breathing, please contact us.  I hope you feel better soon!

## 2021-02-09 ENCOUNTER — Telehealth: Payer: Self-pay | Admitting: Family Medicine

## 2021-02-09 NOTE — Chronic Care Management (AMB) (Signed)
°  Chronic Care Management   Outreach Note  02/09/2021 Name: [name] MRN: [id number] DOB: [birth year]  Referred by: [name], MD Reason for referral : No chief complaint on file.   A second unsuccessful telephone outreach was attempted today. The patient was referred to pharmacist for assistance with care management and care coordination.  Follow Up Plan:   [name] Upstream Scheduler

## 2021-02-12 ENCOUNTER — Ambulatory Visit: Payer: PPO | Admitting: Gastroenterology

## 2021-02-24 ENCOUNTER — Telehealth: Payer: Self-pay | Admitting: Family Medicine

## 2021-02-24 NOTE — Chronic Care Management (AMB) (Signed)
°  Chronic Care Management   Note  02/24/2021 Name: [name] MRN: [id number] DOB: [birth year]  [name] is a 75 y.o. year old female who is a primary care patient of [name], MD. I reached out to [name] by phone today in response to a referral sent by Ms. Ulani Kertesz's PCP, [name], MD.   Ms. Buttrey was given information about Chronic Care Management services today including:  CCM service includes personalized support from designated clinical staff supervised by her physician, including individualized plan of care and coordination with other care providers 24/7 contact phone numbers for assistance for urgent and routine care needs. Service will only be billed when office clinical staff spend 20 minutes or more in a month to coordinate care. Only one practitioner may furnish and bill the service in a calendar month. The patient may stop CCM services at any time (effective at the end of the month) by phone call to the office staff.   Patient agreed to services and verbal consent obtained.   Follow up plan:   Tatjana Secretary/administrator

## 2021-02-28 DIAGNOSIS — N8110 Cystocele, unspecified: Secondary | ICD-10-CM

## 2021-02-28 DIAGNOSIS — N816 Rectocele: Secondary | ICD-10-CM

## 2021-02-28 HISTORY — DX: Rectocele: N81.6

## 2021-02-28 HISTORY — DX: Rectocele: N81.10

## 2021-03-02 ENCOUNTER — Encounter: Payer: Self-pay | Admitting: Internal Medicine

## 2021-03-02 ENCOUNTER — Ambulatory Visit: Payer: PPO | Admitting: Internal Medicine

## 2021-03-02 VITALS — BP 112/68 | HR 97 | Ht 61.0 in | Wt 129.0 lb

## 2021-03-02 DIAGNOSIS — K6289 Other specified diseases of anus and rectum: Secondary | ICD-10-CM

## 2021-03-02 DIAGNOSIS — K648 Other hemorrhoids: Secondary | ICD-10-CM

## 2021-03-02 DIAGNOSIS — R198 Other specified symptoms and signs involving the digestive system and abdomen: Secondary | ICD-10-CM | POA: Diagnosis not present

## 2021-03-02 DIAGNOSIS — K602 Anal fissure, unspecified: Secondary | ICD-10-CM

## 2021-03-02 DIAGNOSIS — K5900 Constipation, unspecified: Secondary | ICD-10-CM | POA: Diagnosis not present

## 2021-03-02 DIAGNOSIS — K625 Hemorrhage of anus and rectum: Secondary | ICD-10-CM

## 2021-03-02 MED ORDER — DILTIAZEM GEL 2 %: CUTANEOUS | 1 refills | Status: DC

## 2021-03-02 NOTE — Progress Notes (Signed)
° °  Subjective:    Patient ID: [name], female    DOB: 11-18-1945, 76 y.o.   MRN: [id number]  HPI [name] pain is a 76 year old female with symptomatic bleeding internal hemorrhoids who presents to consider additional hemorrhoidal banding.  She is here alone today.  She was here for hemorrhoidal banding on 12/31/2020.  Symptoms prior to banding included painless bleeding with each bowel movement, perianal itching and burning.  She reports that she did well for 2 weeks but since that time has had intermittent prolapse type discomfort but more significantly perianal pain particular with defecation.  She has had some scant blood with wiping.  She is also noticed some mild constipation and hard stool and difficulty with defecation.  She is using Dulcolax stool softeners for the last 7 days which is helped.  She tried hydrocortisone suppositories without benefit.  She is not having abdominal pain with this.   Review of Systems As per HPI, otherwise negative  Current Medications, Allergies, Past Medical History, Past Surgical History, Family History and Social History were reviewed in [employer] record.    Objective:   Physical Exam BP 112/68    Pulse 97    Ht 5\' 1"  (1.549 m)    Wt 129 lb (58.5 kg)    LMP 08/15/1988 (Exact Date)    BMI 24.37 kg/m  Gen: awake, alert, NAD HEENT: anicteric  Abd: soft, NT/ND, +BS throughout Rectal: Small skin tags, left lateral and anterior anal fissure which is tender to palpation, complete internal digital rectal exam not performed today due to fissures Ext: no c/c/e Neuro: nonfocal     Assessment & Plan:  76 year old female with symptomatic bleeding internal hemorrhoids who presents to consider additional hemorrhoidal banding.   Perianal pain/anal fissures/difficulty with defecation/mild constipation/internal hemorrhoids --her symptoms now are most consistent with fissure which we discussed at length today.  This would explain the  hydrocortisone cream has not been helpful.  RectiCare is only provided minimal benefit.  I have a suspicion that she could have intermittent rectal prolapse and we discussed how this is different than prolapsed internal hemorrhoid.  Certainly banding would not be the appropriate treatment for rectal prolapse, but should help with internal hemorrhoids.  I have recommended we distinguish between the 2 before additional hemorrhoidal band therapy.  Recommendations as follows: --Diltiazem gel 2% twice daily for 4 to 6 weeks; we explained how to use this correctly --RectiCare per box instruction which can be used at the same time as diltiazem as needed for pain --MRI pelvis with defecography --evaluate for rectal prolapse --Continue docusate 200 mg at bedtime --Return in 4 to 6 weeks --Consider additional hemorrhoidal banding in the future depending on response to the above treatment and MRI results  She has an upcoming vacation to [place] in late January.  Hopefully symptoms will have improved by then.  If symptoms worsen prior to this trip she should call me.  30 minutes total spent today including patient facing time, coordination of care, reviewing medical history/procedures/pertinent radiology studies, and documentation of the encounter.

## 2021-03-02 NOTE — Patient Instructions (Addendum)
Please purchase the following medications over the counter and take as directed: Docusate 200 mg every night. Recticare-as needed. ____________________________________________  Please follow up with Dr Hilarie Fredrickson on  04/27/21 at 850 am. ____________________________________________  We have sent a prescription for Diltiazem 2% gel to [facility] for you. Using your index finger, you should apply a small amount of medication inside the rectum up to your first knuckle/joint twice daily x 8 weeks.  [facility]'s information is below: Address: [address] [place], [place] [address]  [phone]  *Please DO NOT go directly from our office to pick up this medication! Give the pharmacy 1 day to process the prescription as this is compounded and takes time to make. ________________________________________________________  You have been scheduled for an MRI at [facility] ([address] on Tuesday, 03/16/21. Your appointment time is 210 pm. Please arrive 15 minutes prior to your appointment time for registration purposes. Please make certain not to have anything to eat or drink 6 hours prior to your test. In addition, if you have any metal in your body, have a pacemaker or defibrillator, please be sure to let your ordering physician know. Make sure to have a full bladder and to refrain from using the restroom for 1 hour prior to test. This test typically takes 45 minutes to 1 hour to complete. Should you need to reschedule, please call [phone] to do so. ________________________________________________________  If you are age 78 or older, your body mass index should be between 23-30. Your Body mass index is 24.37 kg/m. If this is out of the aforementioned range listed, please consider follow up with your Primary Care Provider.  If you are age 78 or younger, your body mass index should be between 19-25. Your Body mass index is 24.37 kg/m. If this is out of the  aformentioned range listed, please consider follow up with your Primary Care Provider.   ________________________________________________________  The [facility] providers would like to encourage you to use [facility] to communicate with providers for non-urgent requests or questions.  Due to long hold times on the telephone, sending your provider a message by [facility] may be a faster and more efficient way to get a response.  Please allow 48 business hours for a response.  Please remember that this is for non-urgent requests.  _______________________________________________________  Due to recent changes in healthcare laws, you may see the results of your imaging and laboratory studies on MyChart before your provider has had a chance to review them.  We understand that in some cases there may be results that are confusing or concerning to you. Not all laboratory results come back in the same time frame and the provider may be waiting for multiple results in order to interpret others.  Please give [place] 48 hours in order for your provider to thoroughly review all the results before contacting the office for clarification of your results.

## 2021-03-03 DIAGNOSIS — F3342 Major depressive disorder, recurrent, in full remission: Secondary | ICD-10-CM | POA: Diagnosis not present

## 2021-03-09 ENCOUNTER — Ambulatory Visit: Payer: PPO | Admitting: Nurse Practitioner

## 2021-03-12 DIAGNOSIS — H903 Sensorineural hearing loss, bilateral: Secondary | ICD-10-CM | POA: Diagnosis not present

## 2021-03-15 ENCOUNTER — Encounter: Payer: Self-pay | Admitting: *Deleted

## 2021-03-16 ENCOUNTER — Other Ambulatory Visit: Payer: PPO

## 2021-03-29 ENCOUNTER — Other Ambulatory Visit: Payer: Self-pay

## 2021-03-29 ENCOUNTER — Ambulatory Visit
Admission: RE | Admit: 2021-03-29 | Discharge: 2021-03-29 | Disposition: A | Discharge status: Home / Self Care | Payer: PPO | Source: Ambulatory Visit | Attending: Internal Medicine | Admitting: Internal Medicine

## 2021-03-29 DIAGNOSIS — K6289 Other specified diseases of anus and rectum: Secondary | ICD-10-CM

## 2021-03-29 DIAGNOSIS — N8110 Cystocele, unspecified: Secondary | ICD-10-CM | POA: Diagnosis not present

## 2021-03-29 DIAGNOSIS — K623 Rectal prolapse: Secondary | ICD-10-CM | POA: Diagnosis not present

## 2021-03-29 DIAGNOSIS — N3289 Other specified disorders of bladder: Secondary | ICD-10-CM | POA: Diagnosis not present

## 2021-03-29 DIAGNOSIS — N816 Rectocele: Secondary | ICD-10-CM | POA: Diagnosis not present

## 2021-04-01 ENCOUNTER — Telehealth: Payer: Self-pay | Admitting: Internal Medicine

## 2021-04-01 NOTE — Telephone Encounter (Signed)
Pt called back and has decided she would like to be referred to urogynecology-Dr. [place] Pink.

## 2021-04-01 NOTE — Telephone Encounter (Signed)
Patient called and wanted to speak with you again.  Please call.  Thank you.

## 2021-04-05 NOTE — Telephone Encounter (Signed)
Pt referred to Dr. Blossom Hoops. Pt scheduled to see the doctor at the [place] location 07/01/21 at 1pm. She is scheduled at the [place] location [address]. Pt will receive a packet in the mail with the appt information and location.

## 2021-04-15 ENCOUNTER — Telehealth: Payer: Self-pay | Admitting: Pharmacist

## 2021-04-15 NOTE — Chronic Care Management (AMB) (Signed)
Chronic Care Management Pharmacy Assistant   Name: Amere Rattigan  MRN: [id number] DOB: [birth year]  Reason for Encounter: Chart prep for initial encounter with Jeni Salles on 04/22/21 at 1:30 via phone call   Conditions to be addressed/monitored: HTN, Depression, Asthma, Osteopenia, and Osteoarthritis  Recent office visits:  12/12/2 [name], MD - Patient presented via Video for Cough and other concerns. No medication changes.  Recent consult visits:  03/29/21 Pyrtle, Lajuan Lines MD [facility]) - Patient presented for rectal pain.Recommendations as follows:Diltiazem gel 2% twice daily for 4 to 6 weeks; Recti Care per box instruction   01/14/21 Mahar, Jaclyn Shaggy (Orthopedic Surg) - Patient presented for Chronic pain syndrome. No other visit details available.  01/04/21 Salvadore Dom, MD (OBGYN) - Patient presented for GYN exam with abnormal findings and other concerns. Prescribed Clobetasol Propionate. Stopped Betamethasone Valerate.  12/28/20 Ronnell Guadalajara - Patient presented for Hearing aid assessment. No other visit details available.  12/23/20 Gloris Manchester B - Patient presented for Basal cell carcinoma of skin and other parts of face and other concerns. No other visit details available.  12/08/20 Guy Begin MD, Julien Girt [facility]) - Patient presented for Cervicalgia and other concerns. No other visit details available.  12/08/20 Eulah Pont (Audiology) - Patient presented for Sensorineural hearing loss bilateral and other concerns. No other visit details available.  11/25/20 Nelson Chimes (Radiology) - Patient presented for MRI of cervical and lumbar spine. No other visit details available.  11/03/20 Esterwood, Amy S, PA-C [facility]) - Patient presented for rectal bleeding and other concerns. Prescribed Hydrocortisone.  10/22/20 Jari Pigg (Dermatology) - Patient presented for Papule of face and other concerns. No other visit details available.   Hospital visits:  None  in previous 6 months  Medications: Outpatient Encounter Medications as of 04/15/2021  Medication Sig Note   amLODipine (NORVASC) 5 MG tablet Take 1 tablet (5 mg total) by mouth daily.    B Complex-C (B-COMPLEX WITH VITAMIN C) tablet Take 1 tablet by mouth 2 (two) times a day. 08/27/2018: Takes B-complex 100 once daily   Calcium Carbonate-Vit D-Min (CALCIUM 1200 PO) Take 2,400 mg by mouth daily.  02/27/2019: Unsure of dose, takes 2 together.   chlorpheniramine-HYDROcodone (TUSSIONEX) 10-8 MG/5ML SUER  02/08/2021: finished   clobetasol ointment (TEMOVATE) 0.05 % Apply a small amount topically BID x 2 weeks    diltiazem 2 % GEL Using your index finger, apply a small amount of medication inside the rectum up to your first knuckle/joint twice daily x 8 weeks.    famotidine (PEPCID) 40 MG tablet TAKE 1 TABLET BY MOUTH TWICE A DAY    FLOVENT HFA 110 MCG/ACT inhaler Inhale 2 puffs into the lungs 2 (two) times daily.    HYDROcodone-acetaminophen (NORCO/VICODIN) 5-325 MG tablet Take 0.5 tablets by mouth every 6 (six) hours as needed for moderate pain. 12/18/2019: Taking 1/2 tablet 3-4x/week lately   lamoTRIgine (LAMICTAL) 150 MG tablet Take 150 mg by mouth at bedtime.    Liniments (SALONPAS PAIN RELIEF PATCH EX) Place 1 patch onto the skin daily as needed (pain). 02/08/2021: As needed   meloxicam (MOBIC) 7.5 MG tablet Take 7.5 mg by mouth 2 (two) times daily. 02/08/2021: Uses for arthirits   Multiple Vitamins-Minerals (EMERGEN-C IMMUNE) PACK Take by mouth. 02/08/2021: Powder-daily   Multiple Vitamins-Minerals (HAIR SKIN AND NAILS FORMULA PO) Take 1 tablet by mouth daily.     predniSONE (DELTASONE) 10 MG tablet Take by mouth. 02/08/2021: finished   PROAIR HFA  108 (90 Base) MCG/ACT inhaler Inhale 2 puffs into the lungs every 4 (four) hours as needed.    risedronate (ACTONEL) 150 MG tablet TAKE 1 TABLET EVERY 30 DAYS WITH WATER ON EMPTY STOMACH, NOTHING BY MOUTH OR LIE DOWN FOR 30 MINUTES    tretinoin  (RETIN-A) 0.025 % cream Apply 1 application topically daily.    VITAMIN D PO Take 5,000 Units by mouth daily.     zolpidem (AMBIEN) 10 MG tablet Take 2.5 mg by mouth at bedtime.  02/08/2021: Took last night   No facility-administered encounter medications on file as of 04/15/2021.  Fill History : AMLODIPINE BESYLATE 5 MG TAB 03/04/2021 90   CLOBETASOL 0.05% OINTMENT 01/04/2021 10   Diltiazem 2% Ointment 03/03/2021 30   FAMOTIDINE 40 MG TABLET 03/21/2021 90   FLOVENT HFA 110 MCG INHALER 07/13/2020 30   HYDROCODONE-ACETAMIN 10-325 MG 01/14/2021 30   LAMOTRIGINE 150 MG TABLET 03/03/2021 90   MELOXICAM 15 MG TABLET 04/05/2021 30   PREDNISONE 10 MG TABLET 01/31/2021 10   PROAIR HFA 90 MCG INHALER 09/17/2020 30   RISEDRONATE SODIUM 150 MG TAB 10/02/2020 90   TRETINOIN 0.025% CREAM 08/30/2020 30   ZOLPIDEM TARTRATE 10 MG TABLET 03/03/2021 90    Have you seen any other providers since your last visit? Patient reports other than [facility] no  Any changes in your medications or health? Patient reports none  Any side effects from any medications? Patient reports none  Do you have an symptoms or problems not managed by your medications? Patient reports no  Any concerns about your health right now? Patient reports she is concerned about upcoming rectal surgery in May wants it to be here and done with. She reports she has an upcoming appointment with Gertie Fey and is hoping asking questions will settle her a bit more about it.  Has your provider asked that you check blood pressure, blood sugar, or follow special diet at home? Patient reports she is checking her bp has an arm cuff  Do you get any type of exercise on a regular basis? Patient reports she belongs to a senior group and exercises 3 times a week and also walks a lot will do more outdoors when it warms up.  Can you think of a goal you would like to reach for your health? Patient reports she would like to loose about 5 lbs but she  enjoys eating and has a big appetite.  Do you have any problems getting your medications? Patient reports she is happy with her pharmacies uses [facility] for the more expensive ones and that makes a big difference for her.  Is there anything that you would like to discuss during the appointment? Patient reports not at this time.  Patient aware to have available blood pressure readings and medications for call and that she will receive a reminder call prior.    Care Gaps: COVID Booster - Overdue Flu Vaccine - Overdue BP- 124/64 (11/03/20) AWV- 04/22/21  Star Rating Drugs: None   Ned Clines [place] Clinical Pharmacist Assistant [phone]

## 2021-04-16 ENCOUNTER — Encounter: Payer: Self-pay | Admitting: *Deleted

## 2021-04-20 ENCOUNTER — Telehealth: Payer: Self-pay | Admitting: Pharmacist

## 2021-04-20 NOTE — Chronic Care Management (AMB) (Signed)
° ° °  Chronic Care Management Pharmacy Assistant   Name: [name]  MRN: [id number] DOB: 06-02-1945 04/20/21 APPOINTMENT REMINDER   Called Patient No answer, left message of appointment on 04/22/21 at 1:30 via telephone visit with Jeni Salles, Pharm D.   Notified to have all medications, supplements, blood pressure and/or blood sugar logs available during appointment and to return call if need to reschedule.      Care Gaps: COVID Booster - Overdue Flu Vaccine - Overdue BP- 124/64 (11/03/20) AWV- 04/22/21  Star Rating Drug: None  Any gaps in medications fill history? none       Medications: Outpatient Encounter Medications as of 04/20/2021  Medication Sig Note   amLODipine (NORVASC) 5 MG tablet Take 1 tablet (5 mg total) by mouth daily.    B Complex-C (B-COMPLEX WITH VITAMIN C) tablet Take 1 tablet by mouth 2 (two) times a day. 08/27/2018: Takes B-complex 100 once daily   Calcium Carbonate-Vit D-Min (CALCIUM 1200 PO) Take 2,400 mg by mouth daily.  02/27/2019: Unsure of dose, takes 2 together.   chlorpheniramine-HYDROcodone (TUSSIONEX) 10-8 MG/5ML SUER  02/08/2021: finished   clobetasol ointment (TEMOVATE) 0.05 % Apply a small amount topically BID x 2 weeks    diltiazem 2 % GEL Using your index finger, apply a small amount of medication inside the rectum up to your first knuckle/joint twice daily x 8 weeks.    famotidine (PEPCID) 40 MG tablet TAKE 1 TABLET BY MOUTH TWICE A DAY    FLOVENT HFA 110 MCG/ACT inhaler Inhale 2 puffs into the lungs 2 (two) times daily.    HYDROcodone-acetaminophen (NORCO/VICODIN) 5-325 MG tablet Take 0.5 tablets by mouth every 6 (six) hours as needed for moderate pain. 12/18/2019: Taking 1/2 tablet 3-4x/week lately   lamoTRIgine (LAMICTAL) 150 MG tablet Take 150 mg by mouth at bedtime.    Liniments (SALONPAS PAIN RELIEF PATCH EX) Place 1 patch onto the skin daily as needed (pain). 02/08/2021: As needed   meloxicam (MOBIC) 7.5 MG tablet Take 7.5 mg by  mouth 2 (two) times daily. 02/08/2021: Uses for arthirits   Multiple Vitamins-Minerals (EMERGEN-C IMMUNE) PACK Take by mouth. 02/08/2021: Powder-daily   Multiple Vitamins-Minerals (HAIR SKIN AND NAILS FORMULA PO) Take 1 tablet by mouth daily.     predniSONE (DELTASONE) 10 MG tablet Take by mouth. 02/08/2021: finished   PROAIR HFA 108 (90 Base) MCG/ACT inhaler Inhale 2 puffs into the lungs every 4 (four) hours as needed.    risedronate (ACTONEL) 150 MG tablet TAKE 1 TABLET EVERY 30 DAYS WITH WATER ON EMPTY STOMACH, NOTHING BY MOUTH OR LIE DOWN FOR 30 MINUTES    tretinoin (RETIN-A) 0.025 % cream Apply 1 application topically daily.    VITAMIN D PO Take 5,000 Units by mouth daily.     zolpidem (AMBIEN) 10 MG tablet Take 2.5 mg by mouth at bedtime.  02/08/2021: Took last night   No facility-administered encounter medications on file as of 04/20/2021.    [place] Clinical Pharmacist Assistant [phone]

## 2021-04-22 ENCOUNTER — Ambulatory Visit (INDEPENDENT_AMBULATORY_CARE_PROVIDER_SITE_OTHER): Payer: PPO | Admitting: Pharmacist

## 2021-04-22 DIAGNOSIS — I10 Essential (primary) hypertension: Secondary | ICD-10-CM

## 2021-04-22 DIAGNOSIS — M8580 Other specified disorders of bone density and structure, unspecified site: Secondary | ICD-10-CM

## 2021-04-22 NOTE — Patient Instructions (Signed)
Hi Kira,  It was great to get to meet you over the telephone! Below is a summary of some of the topics we discussed.   Don't forget to start back on the multivitamin and 1 tablet of the calcium per day in order to do everything we can to strengthen your bones and prevent fractures. If you are looking to stop something, I don't think you really need to take the vitamin B complex at this point but up to you.  Keep on checking your blood pressures regularly as well so we can make sure your medication is working properly and your pain isn't influencing your readings.  Please reach out to me if you have any questions or need anything!  Best, Maddie  Jeni Salles, PharmD, [facility] [phone]   Visit Information   Goals Addressed             This Visit's Progress    Track and Manage My Blood Pressure-Hypertension       Timeframe:  Long-Range Goal Priority:  Medium Start Date:                             Expected End Date:                       Follow Up Date 07/20/21    - check blood pressure weekly - choose a place to take my blood pressure (home, clinic or office, retail store) - write blood pressure results in a log or diary    Why is this important?   You won't feel high blood pressure, but it can still hurt your blood vessels.  High blood pressure can cause heart or kidney problems. It can also cause a stroke.  Making lifestyle changes like losing a little weight or eating less salt will help.  Checking your blood pressure at home and at different times of the day can help to control blood pressure.  If the doctor prescribes medicine remember to take it the way the doctor ordered.  Call the office if you cannot afford the medicine or if there are questions about it.     Notes:        Patient Care Plan: CCM Pharmacy Care Plan     Problem Identified: Problem: Hypertension, GERD, Asthma, Depression, Osteopenia, and  Osteoarthritis      Long-Range Goal: Patient-Specific Goal   Start Date: 04/22/2021  Expected End Date: 04/22/2022  This Visit's Progress: On track  Priority: High  Note:   Current Barriers:  Unable to independently monitor therapeutic efficacy  Pharmacist Clinical Goal(s):  Patient will achieve adherence to monitoring guidelines and medication adherence to achieve therapeutic efficacy through collaboration with PharmD and provider.   Interventions: 1:1 collaboration with [name], MD regarding development and update of comprehensive plan of care as evidenced by provider attestation and co-signature Inter-disciplinary care team collaboration (see longitudinal plan of care) Comprehensive medication review performed; medication list updated in electronic medical record  Hypertension (BP goal <130/80) -Not ideally controlled -Current treatment: Amlodipine 5 mg 1 tablet daily - Appropriate, Effective, Safe, Accessible -Medications previously tried: none  -Current home readings: checking once every couple of weeks; 120/80  -Current dietary habits: doesn't use a lot of salt; eats out some and cooks at home as well; going out on the weekends; tries not to eat much beef -Current exercise habits: exercise class 3 times a  week -Denies hypotensive/hypertensive symptoms -Educated on BP goals and benefits of medications for prevention of heart attack, stroke and kidney damage; Importance of home blood pressure monitoring; Proper BP monitoring technique; -Counseled to monitor BP at home weekly, document, and provide log at future appointments -Counseled on diet and exercise extensively Recommended to continue current medication  Asthma (Goal: control symptoms and prevent exacerbations) -Controlled -Current treatment  Flovent 110 mcg/act 2 puffs twice daily as needed - Appropriate, Effective, Safe, Accessible Proair HFA as needed - Appropriate, Effective, Safe, Accessible -Medications  previously tried: none  -Pulmonary function testing: none -Patient denies consistent use of maintenance inhaler -Frequency of rescue inhaler use: not often -Counseled on Proper inhaler technique; When to use rescue inhaler -Recommended to continue current medication  Depression(Goal: minimize symptoms) -Controlled -Current treatment: Lamotrigine 150 mg 1 tablet at bedtime  - Appropriate, Effective, Safe, Accessible -Medications previously tried/failed: unknown -PHQ9: 0 -Educated on Benefits of medication for symptom control Benefits of cognitive-behavioral therapy with or without medication -Recommended to continue current medication  Osteopenia (Goal prevent fractures) -Controlled -Last DEXA Scan: 2021   T-Score femoral neck: -2.2  T-Score total hip: n/a  T-Score lumbar spine: n/a  T-Score forearm radius: -2.4  10-year probability of major osteoporotic fracture: 15.1%  10-year probability of hip fracture: 4.6% -Patient is not a candidate for pharmacologic treatment -Current treatment  Risedronate 150 mg 1 tablet every 30 days (started 10/21; taking the 3rd of the month) - Appropriate, Effective, Safe, Accessible Calcium 600 mg with vitamin D - 400 units - stopped taking Multivitamin 1 tablet daily - stopped taking -Medications previously tried: none  -Recommend [phone] units of vitamin D daily. Recommend 1200 mg of calcium daily from dietary and supplemental sources. Counseled on oral bisphosphonate administration: take in the morning, 30 minutes prior to food with 6-8 oz of water. Do not lie down for at least 30 minutes after taking. Recommend weight-bearing and muscle strengthening exercises for building and maintaining bone density. -Recommended for patient to restart multivitamin and 1 tablet of 600 mg of calcium daily based on current dietary intake.   Pain/osteoarthritis (Goal: minimize pain) -Not ideally controlled -Current treatment  Meloxicam 15 mg 1 tablet daily -  not taking  Hydrocodone-APAP 5-325 mg 1/2 tablet as needed - Appropriate, Effective, Safe, Accessible -Medications previously tried: Celebrex (upset stomach), Voltaren gel (ineffective) -Recommended to continue current medication Recommended for patient to try higher dose of meloxicam but to take it with food.  Insomnia (Goal: improve quality and quantity of sleep) -Controlled -Current treatment  Zolpidem 10 mg 1 tablet at bedtime (1/3 tablet) - Appropriate, Effective, Query Safe, Accessible -Medications previously tried: unknown  - Patient has tried to come off in the past without success.  GERD/Hiatal hernia (Goal: minimize symptoms) -Not ideally controlled -Current treatment  Famotidine 40 mg 1 tablet twice daily - Appropriate, Query effective, Safe, Accessible Mylanta as needed - Appropriate, Effective, Safe, Accessible -Medications previously tried: unknown  -Counseled on non-pharmacologic management of symptoms such as elevating the head of your bed, avoiding eating 2-3 hours before bed, avoiding triggering foods such as acidic, spicy, or fatty foods, eating smaller meals, and wearing clothes that are loose around the waist.  Health Maintenance -Vaccine gaps: none -Current therapy:  Clobetasol 0.05% apply as needed Tretinoin 0.025% cream as needed Diltiazem gel 2% as needed Hair skin & nails daily Vitamin B complex daily  Collagen daily -Educated on Cost vs benefit of each product must be carefully weighed by individual consumer -Patient  is satisfied with current therapy and denies issues -Counseled on lack of need or indication for vitamin B complex and can stop if patient wants to stop.  Patient Goals/Self-Care Activities Patient will:  - take medications as prescribed as evidenced by patient report and record review check blood pressure weekly, document, and provide at future appointments target a minimum of 150 minutes of moderate intensity exercise weekly  Follow Up  Plan: The care management team will reach out to the patient again over the next 90 days.       Ms. Kaffenberger was given information about Chronic Care Management services today including:  CCM service includes personalized support from designated clinical staff supervised by her physician, including individualized plan of care and coordination with other care providers 24/7 contact phone numbers for assistance for urgent and routine care needs. Standard insurance, coinsurance, copays and deductibles apply for chronic care management only during months in which we provide at least 20 minutes of these services. Most insurances cover these services at 100%, however patients may be responsible for any copay, coinsurance and/or deductible if applicable. This service may help you avoid the need for more expensive face-to-face services. Only one practitioner may furnish and bill the service in a calendar month. The patient may stop CCM services at any time (effective at the end of the month) by phone call to the office staff.  Patient agreed to services and verbal consent obtained.   The patient verbalized understanding of instructions, educational materials, and care plan provided today and agreed to receive a mailed copy of patient instructions, educational materials, and care plan.  The pharmacy team will reach out to the patient again over the next 90 days.   Viona Gilmore, [facility]

## 2021-04-22 NOTE — Progress Notes (Signed)
Chronic Care Management Pharmacy Note  04/22/2021 Name:  [name] MRN:  [id number] DOB:  Mar 03, 1945  Summary: BP is mostly at goal < 130/80 Pt is still having pain and feels the meloxicam is not helping  Recommendations/Changes made from today's visit: -Recommended for patient to restart multivitamin and 1 tablet of 600 mg of calcium daily based on current dietary intake -Recommended for patient to consider stopping vitamin B complex with no clear indication for use -Recmmended for patient to try higher dose of meloxicam but to take it with food  Plan: BP assessment in 3 months  Subjective: [name] is an 76 y.o. year old female who is a primary patient of [name], MD.  The CCM team was consulted for assistance with disease management and care coordination needs.    Engaged with patient by telephone for initial visit in response to provider referral for pharmacy case management and/or care coordination services.   Consent to Services:  The patient was given the following information about Chronic Care Management services today, agreed to services, and gave verbal consent: 1. CCM service includes personalized support from designated clinical staff supervised by the primary care provider, including individualized plan of care and coordination with other care providers 2. 24/7 contact phone numbers for assistance for urgent and routine care needs. 3. Service will only be billed when office clinical staff spend 20 minutes or more in a month to coordinate care. 4. Only one practitioner may furnish and bill the service in a calendar month. 5.The patient may stop CCM services at any time (effective at the end of the month) by phone call to the office staff. 6. The patient will be responsible for cost sharing (co-pay) of up to 20% of the service fee (after annual deductible is met). Patient agreed to services and consent obtained.  Patient Care Team: [name], MD as PCP - General (Family  Medicine) Pyrtle, Lajuan Lines, MD as Consulting Physician (Gastroenterology) Paralee Cancel, MD as Consulting Physician (Orthopedic Surgery) Michael Boston, MD as Consulting Physician (General Surgery) Viona Gilmore, [facility] as Pharmacist (Pharmacist)  Recent office visits: 02/08/21 [name], MD - Patient presented via Video for Cough and other concerns. No medication changes.  Recent consult visits: 03/29/21 Pyrtle, Lajuan Lines MD Gert[facility] Fey) - Patient presented for rectal pain.Recommendations as follows:Diltiazem gel 2% twice daily for 4 to 6 weeks; Recti Care per box instruction    01/14/21 Mahar, Jaclyn Shaggy (Orthopedic Surg) - Patient presented for Chronic pain syndrome. No other visit details available.   01/04/21 Salvadore Dom, MD (OBGYN) - Patient presented for GYN exam with abnormal findings and other concerns. Prescribed Clobetasol Propionate. Stopped Betamethasone Valerate.   12/28/20 Ronnell Guadalajara - Patient presented for Hearing aid assessment. No other visit details available.   12/23/20 Gloris Manchester B - Patient presented for Basal cell carcinoma of skin and other parts of face and other concerns. No other visit details available.   12/08/20 Guy Begin MD, Julien Girt [facility]) - Patient presented for Cervicalgia and other concerns. No other visit details available.   12/08/20 Eulah Pont (Audiology) - Patient presented for Sensorineural hearing loss bilateral and other concerns. No other visit details available.   11/25/20 Nelson Chimes (Radiology) - Patient presented for MRI of cervical and lumbar spine. No other visit details available.   11/03/20 Esterwood, Amy S, PA-C [facility]ey) - Patient presented for rectal bleeding and other concerns. Prescribed Hydrocortisone.   10/22/20 Jari Pigg (Dermatology) - Patient presented  for Papule of face and other concerns. No other visit details available.   Hospital visits: None in previous 6 months   Objective:  Lab Results   Component Value Date   CREATININE 0.72 09/10/2020   BUN 20 09/10/2020   GFR 102.41 11/06/2015   EGFR 86 09/10/2020   GFRNONAA 90 12/18/2019   GFRAA 104 12/18/2019   NA 136 09/10/2020   K 4.5 09/10/2020   CALCIUM 8.7 09/10/2020   CO2 25 09/10/2020   GLUCOSE 98 09/10/2020    Lab Results  Component Value Date/Time   GFR 102.41 11/06/2015 02:57 PM    Last diabetic Eye exam: No results found for: HMDIABEYEEXA  Last diabetic Foot exam: No results found for: HMDIABFOOTEX   Lab Results  Component Value Date   CHOL 203 (H) 08/29/2019   HDL 77 08/29/2019   LDLCALC 113 (H) 08/29/2019   TRIG 70 08/29/2019   CHOLHDL 2.6 08/29/2019    Hepatic Function Latest Ref Rng & Units 09/10/2020 12/18/2019 08/29/2019  Total Protein 6.0 - 8.5 g/dL 6.5 6.4 6.4  Albumin 3.7 - 4.7 g/dL 4.6 4.5 4.5  AST 0 - 40 IU/L $Remov'22 19 18  'WkkAVM$ ALT 0 - 32 IU/L $Remov'19 20 18  'iVdiTv$ Alk Phosphatase 44 - 121 IU/L 81 116 104  Total Bilirubin 0.0 - 1.2 mg/dL 0.3 <0.2 0.2    Lab Results  Component Value Date/Time   TSH 2.520 09/10/2020 09:10 AM   TSH 1.270 02/14/2018 02:16 PM    CBC Latest Ref Rng & Units 09/10/2020 12/18/2019 08/29/2019  WBC 3.4 - 10.8 x10E3/uL 4.8 6.6 5.0  Hemoglobin 11.1 - 15.9 g/dL 12.6 12.3 13.0  Hematocrit 34.0 - 46.6 % 38.3 36.9 38.7  Platelets 150 - 450 x10E3/uL 334 314 376    Lab Results  Component Value Date/Time   VD25OH 40.0 08/29/2019 09:27 AM   VD25OH 35.8 09/19/2018 10:01 AM    Clinical ASCVD: No  The 10-year ASCVD risk score (Arnett DK, et al., 2019) is: 20.9%   Values used to calculate the score:     Age: 89 years     Sex: Female     Is Non-Hispanic African American: No     Diabetic: No     Tobacco smoker: No     Systolic Blood Pressure: 982 mmHg     Is BP treated: Yes     HDL Cholesterol: 77 mg/dL     Total Cholesterol: 203 mg/dL    Depression screen [facility] 2/9 09/07/2020 09/04/2019 08/27/2018  Decreased Interest 0 0 0  Down, Depressed, Hopeless 0 0 0  PHQ - 2 Score 0 0 0  Some recent  data might be hidden      Social History   Tobacco Use  Smoking Status Former   Years: 1.00   Types: Cigarettes  Smokeless Tobacco Never  Tobacco Comments   college year   BP Readings from Last 3 Encounters:  03/02/21 112/68  02/08/21 138/74  01/04/21 120/76   Pulse Readings from Last 3 Encounters:  03/02/21 97  01/04/21 77  12/31/20 (!) 52   Wt Readings from Last 3 Encounters:  03/02/21 129 lb (58.5 kg)  02/08/21 123 lb (55.8 kg)  01/04/21 128 lb (58.1 kg)   BMI Readings from Last 3 Encounters:  03/02/21 24.37 kg/m  02/08/21 23.24 kg/m  01/04/21 24.48 kg/m    Assessment/Interventions: Review of patient past medical history, allergies, medications, health status, including review of consultants reports, laboratory and other test data, was performed as  part of comprehensive evaluation and provision of chronic care management services.   SDOH:  (Social Determinants of Health) assessments and interventions performed: Yes SDOH Interventions    Flowsheet Row Most Recent Value  SDOH Interventions   Financial Strain Interventions Intervention Not Indicated  Transportation Interventions Intervention Not Indicated      SDOH Screenings   Alcohol Screen: Not on file  Depression (PHQ2-9): Low Risk    PHQ-2 Score: 0  Financial Resource Strain: Low Risk    Difficulty of Paying Living Expenses: Not hard at all  Food Insecurity: Not on file  Housing: Not on file  Physical Activity: Not on file  Social Connections: Not on file  Stress: Not on file  Tobacco Use: Medium Risk   Smoking Tobacco Use: Former   Smokeless Tobacco Use: Never   Passive Exposure: Not on file  Transportation Needs: No Transportation Needs   Lack of Transportation (Medical): No   Lack of Transportation (Non-Medical): No   Patient reports she is concerned with taking the meloxicam daily as she read not good things about it. She also didn't really feel like it was working for her arthritis pain  in her neck. She has tried other pain medications but nothing seems to help. She tries to take the hydrocodone sparingly and doesn't use it every day. She has tried Celebrex in the past as well and this gave her an upset stomach. She has to come home and lay down on her heating pad, which helps some. She fell down a flight of steps when she was 14 and thinks it all started after this. She reports the older she gets the harder it is to deal with the pain.  Patient is trying to do more exercise and went back to yoga. She is very active and has a strong metabolism and does eat a lot for her size. She is doing exercise classes 3 days a week right now.  Patient feels blessed that she can still do what she needs to do and doesn't feel like she has a lot of health problems.  Patient reports she knows what her medications are for. She keeps her medications in the bottles in a box and takes them all individually.   CCM Care Plan  Allergies  Allergen Reactions   Cymbalta [Duloxetine Hcl]     Abdominal pain   Nitrofurantoin [Macrodantin]    Sulfamethoxazole Hives   Cefdinir Rash    Medications Reviewed Today     Reviewed by Viona Gilmore, [facility] (Pharmacist) on 04/22/21 at [facility]ar[facility] List Status: <None>   Medication Order Taking? Sig Documenting Provider Last Dose Status Informant  amLODipine (NORVASC) 5 MG tablet [id number] Yes Take 1 tablet (5 mg total) by mouth daily. [name], MD Taking Active   B Complex-C (B-COMPLEX WITH VITAMIN C) tablet [id number] No Take 1 tablet by mouth 2 (two) times a day.  Patient not taking: Reported on 04/22/2021   [provider] Not Taking Active Self           Med Note Tomi Bamberger, EVE   Mon Aug 27, 2018 12:42 PM) Takes B-complex 100 once daily  Calcium Carbonate-Vit D-Min (CALCIUM 1200 PO) [id number] No Take 2,400 mg by mouth daily.   Patient not taking: Reported on 04/22/2021   [provider] Not Taking Active Self           Med Note Kipp Brood,  Aliviah Spain Cecille Rubin Apr 22, 2021  1:49 PM)  clobetasol ointment (TEMOVATE) 0.05 % [id number] Yes Apply a small amount topically BID x 2 weeks Salvadore Dom, MD Taking Active   diltiazem 2 % GEL [id number] Yes Using your index finger, apply a small amount of medication inside the rectum up to your first knuckle/joint twice daily x 8 weeks. Jerene Bears, MD Taking Active   famotidine (PEPCID) 40 MG tablet [id number] Yes TAKE 1 TABLET BY MOUTH TWICE A DAY Pyrtle, Lajuan Lines, MD Taking Active   FLOVENT HFA 110 MCG/ACT inhaler [id number] Yes Inhale 2 puffs into the lungs 2 (two) times daily. [provider] Taking Active   HYDROcodone-acetaminophen (NORCO/VICODIN) 5-325 MG tablet [id number] Yes Take 0.5 tablets by mouth every 6 (six) hours as needed for moderate pain. [provider] Taking Active            Med Note Tomi Bamberger, EVE   Wed Dec 18, 2019  4:19 PM) Taking 1/2 tablet 3-4x/week lately  lamoTRIgine (LAMICTAL) 150 MG tablet [id number] Yes Take 150 mg by mouth at bedtime. [provider] Taking Active Self  [facility] PAIN RELIEF PATCH EX) [id number] Yes Place 1 patch onto the skin daily as needed (pain). [provider] Taking Active            Med Note Georgiana Spinner Feb 08, 2021 12:35 PM) As needed  meloxicam (MOBIC) 15 MG tablet [id number] Yes Take 15 mg by mouth daily. [provider] Taking Active   Multiple Vitamins-Minerals (CENTRUM SILVER 50+WOMEN PO) [id number] Yes Take 1 tablet by mouth. [provider] Taking Active   Multiple Vitamins-Minerals (HAIR SKIN AND NAILS FORMULA PO) [id number] Yes Take 1 tablet by mouth daily.  [provider] Taking Active Self  PROAIR HFA 108 [id number] Base) MCG/ACT inhaler [id number] Yes Inhale 2 puffs into the lungs every 4 (four) hours as needed. [provider] Taking Active            Med Note Tomi Bamberger, EVE   Mon Feb 08, 2021  2:05 PM)    risedronate (ACTONEL) 150 MG tablet  [id number] Yes TAKE 1 TABLET EVERY 30 DAYS WITH WATER ON EMPTY STOMACH, NOTHING BY MOUTH OR LIE DOWN FOR 30 MINUTES [name], MD Taking Active   zolpidem (AMBIEN) 10 MG tablet [id number] Yes Take 2.5 mg by mouth at bedtime.  [provider] Taking Active Self           Med Note Georgiana Spinner Feb 08, 2021 12:35 PM) Took last night            Patient Active Problem List   Diagnosis Date Noted   Basal cell carcinoma of skin 01/04/2021   History of neoplasm 01/04/2021   Osteoarthritis of ankle and foot 01/13/2020   Pain in left foot 01/13/2020   Pain in left knee 04/08/2019   Status post total shoulder arthroplasty, left 08/30/2018   DSAP (disseminated superficial actinic porokeratosis) 08/26/2018   Low back pain 08/16/2018   Osteoarthritis of left shoulder 08/13/2018   Vitamin D deficiency 01/08/2018   Osteopenia with high risk of fracture 01/08/2018   Essential hypertension 01/08/2018   Mixed conductive and sensorineural hearing loss of right ear with restricted hearing of left ear 09/29/2016   Fatigue 05/23/2016   Cold intolerance 05/23/2016   Depressive disorder, not elsewhere classified 11/30/2011   Spinal stenosis 11/30/2011   Neck pain on left side 11/30/2011   Asthma 12/05/2007   COUGH 12/05/2007    Immunization  History  Administered Date(s) Administered   Influenza Split 11/13/2015   Influenza, High Dose Seasonal PF 03/05/2014, 12/15/2014, 11/25/2016, 12/04/2017, 11/21/2018, 11/27/2019, 12/07/2020   Influenza,inj,quad, With Preservative 11/28/2016, 11/28/2017   Influenza-Unspecified 12/29/2012, 11/25/2016   Moderna Covid-19 Vaccine Bivalent Booster 76yrs & up 02/22/2021   PFIZER Comirnaty(Gray Top)Covid-19 Tri-Sucrose Vaccine 07/28/2020   PFIZER(Purple Top)SARS-COV-2 Vaccination 03/15/2019, 04/05/2019, 12/11/2019   Pneumococcal Conjugate-13 07/02/2014   Pneumococcal Polysaccharide-23 08/29/2007, 07/06/2015   Tdap 08/29/2007, 12/26/2017   Zoster  Recombinat (Shingrix) 12/17/2018, 02/28/2019   Zoster, Live 08/29/2007    Conditions to be addressed/monitored:  Hypertension, GERD, Asthma, Depression, Osteopenia, and Osteoarthritis  Care Plan : CCM Pharmacy Care Plan  Updates made by Viona Gilmore, [place] since 04/22/2021 12:00 AM     Problem: Problem: Hypertension, GERD, Asthma, Depression, Osteopenia, and Osteoarthritis      Long-Range Goal: Patient-Specific Goal   Start Date: 04/22/2021  Expected End Date: 04/22/2022  This Visit's Progress: On track  Priority: High  Note:   Current Barriers:  Unable to independently monitor therapeutic efficacy  Pharmacist Clinical Goal(s):  Patient will achieve adherence to monitoring guidelines and medication adherence to achieve therapeutic efficacy through collaboration with PharmD and provider.   Interventions: 1:1 collaboration with [name], MD regarding development and update of comprehensive plan of care as evidenced by provider attestation and co-signature Inter-disciplinary care team collaboration (see longitudinal plan of care) Comprehensive medication review performed; medication list updated in electronic medical record  Hypertension (BP goal <130/80) -Not ideally controlled -Current treatment: Amlodipine 5 mg 1 tablet daily - Appropriate, Effective, Safe, Accessible -Medications previously tried: none  -Current home readings: checking once every couple of weeks; 120/80  -Current dietary habits: doesn't use a lot of salt; eats out some and cooks at home as well; going out on the weekends; tries not to eat much beef -Current exercise habits: exercise class 3 times a week -Denies hypotensive/hypertensive symptoms -Educated on BP goals and benefits of medications for prevention of heart attack, stroke and kidney damage; Importance of home blood pressure monitoring; Proper BP monitoring technique; -Counseled to monitor BP at home weekly, document, and provide log at future  appointments -Counseled on diet and exercise extensively Recommended to continue current medication  Asthma (Goal: control symptoms and prevent exacerbations) -Controlled -Current treatment  Flovent 110 mcg/act 2 puffs twice daily as needed - Appropriate, Effective, Safe, Accessible Proair HFA as needed - Appropriate, Effective, Safe, Accessible -Medications previously tried: none  -Pulmonary function testing: none -Patient denies consistent use of maintenance inhaler -Frequency of rescue inhaler use: not often -Counseled on Proper inhaler technique; When to use rescue inhaler -Recommended to continue current medication  Depression(Goal: minimize symptoms) -Controlled -Current treatment: Lamotrigine 150 mg 1 tablet at bedtime  - Appropriate, Effective, Safe, Accessible -Medications previously tried/failed: unknown -PHQ9: 0 -Educated on Benefits of medication for symptom control Benefits of cognitive-behavioral therapy with or without medication -Recommended to continue current medication  Osteopenia (Goal prevent fractures) -Controlled -Last DEXA Scan: 2021   T-Score femoral neck: -2.2  T-Score total hip: n/a  T-Score lumbar spine: n/a  T-Score forearm radius: -2.4  10-year probability of major osteoporotic fracture: 15.1%  10-year probability of hip fracture: 4.6% -Patient is not a candidate for pharmacologic treatment -Current treatment  Risedronate 150 mg 1 tablet every 30 days (started 10/21; taking the 3rd of the month) - Appropriate, Effective, Safe, Accessible Calcium 600 mg with vitamin D - 400 units - stopped taking Multivitamin 1 tablet daily - stopped taking -Medications  previously tried: none  -Recommend [phone] units of vitamin D daily. Recommend 1200 mg of calcium daily from dietary and supplemental sources. Counseled on oral bisphosphonate administration: take in the morning, 30 minutes prior to food with 6-8 oz of water. Do not lie down for at least 30  minutes after taking. Recommend weight-bearing and muscle strengthening exercises for building and maintaining bone density. -Recommended for patient to restart multivitamin and 1 tablet of 600 mg of calcium daily based on current dietary intake.   Pain/osteoarthritis (Goal: minimize pain) -Not ideally controlled -Current treatment  Meloxicam 15 mg 1 tablet daily - not taking  Hydrocodone-APAP 5-325 mg 1/2 tablet as needed - Appropriate, Effective, Safe, Accessible -Medications previously tried: Celebrex (upset stomach), Voltaren gel (ineffective) -Recommended to continue current medication Recommended for patient to try higher dose of meloxicam but to take it with food.  Insomnia (Goal: improve quality and quantity of sleep) -Controlled -Current treatment  Zolpidem 10 mg 1 tablet at bedtime (1/3 tablet) - Appropriate, Effective, Query Safe, Accessible -Medications previously tried: unknown  - Patient has tried to come off in the past without success.  GERD/Hiatal hernia (Goal: minimize symptoms) -Not ideally controlled -Current treatment  Famotidine 40 mg 1 tablet twice daily - Appropriate, Query effective, Safe, Accessible Mylanta as needed - Appropriate, Effective, Safe, Accessible -Medications previously tried: unknown  -Counseled on non-pharmacologic management of symptoms such as elevating the head of your bed, avoiding eating 2-3 hours before bed, avoiding triggering foods such as acidic, spicy, or fatty foods, eating smaller meals, and wearing clothes that are loose around the waist.  Health Maintenance -Vaccine gaps: none -Current therapy:  Clobetasol 0.05% apply as needed Tretinoin 0.025% cream as needed Diltiazem gel 2% as needed Hair skin & nails daily Vitamin B complex daily  Collagen daily -Educated on Cost vs benefit of each product must be carefully weighed by individual consumer -Patient is satisfied with current therapy and denies issues -Counseled on lack of  need or indication for vitamin B complex and can stop if patient wants to stop.  Patient Goals/Self-Care Activities Patient will:  - take medications as prescribed as evidenced by patient report and record review check blood pressure weekly, document, and provide at future appointments target a minimum of 150 minutes of moderate intensity exercise weekly  Follow Up Plan: The care management team will reach out to the patient again over the next 90 days.          Medication Assistance: None required.  Patient affirms current coverage meets needs.  Compliance/Adherence/Medication fill history: Care Gaps: COVID booster, influenza vaccine BP- 124/64 (11/03/20)  Star-Rating Drugs: None  Patient's preferred pharmacy is:  [facility] [address] [place] - [address] [place] [place] [address] Phone: [phone] Fax: [phone]  [place], [place] [address] Phone: [phone] Fax: [phone]  Uses pill box? No - does not feel she needs it Pt endorses 100% compliance  We discussed: Current pharmacy is preferred with insurance plan and patient is satisfied with pharmacy services Patient decided to: Continue current medication management strategy  Care Plan and Follow Up Patient Decision:  Patient agrees to Care Plan and Follow-up.  Plan: The care management team will reach out to the patient again over the next 90 days.  Jeni Salles, PharmD, [facility] [phone]

## 2021-04-27 ENCOUNTER — Ambulatory Visit: Payer: PPO | Admitting: Internal Medicine

## 2021-04-27 ENCOUNTER — Encounter: Payer: Self-pay | Admitting: Internal Medicine

## 2021-04-27 VITALS — BP 124/60 | HR 83 | Ht 61.0 in | Wt 128.2 lb

## 2021-04-27 DIAGNOSIS — N816 Rectocele: Secondary | ICD-10-CM

## 2021-04-27 DIAGNOSIS — M6289 Other specified disorders of muscle: Secondary | ICD-10-CM

## 2021-04-27 DIAGNOSIS — I10 Essential (primary) hypertension: Secondary | ICD-10-CM

## 2021-04-27 DIAGNOSIS — K2100 Gastro-esophageal reflux disease with esophagitis, without bleeding: Secondary | ICD-10-CM

## 2021-04-27 DIAGNOSIS — K5900 Constipation, unspecified: Secondary | ICD-10-CM

## 2021-04-27 NOTE — Patient Instructions (Addendum)
Please purchase a "squatty potty" or something similar to allow for better ergonomics while having bowel movements.  Please purchase the following medications over the counter and take as directed: Benefiber 2 teaspoons daily.  Continue Diltiazem 2% as previously directed.  We have sent a referral for you to see Earlie Counts for pelvic floor therapy. If you do not receive a call to schedule within the next 1-2 weeks, please call them directly to schedule.  [address] [place], [place] [address] Phone [phone]  We have contacted Dr Zigmund Daniel office regarding having your appointment expedited. Unfortunately they do not have any sooner appointments. They have placed you on the cancellation list should an appointment become available.

## 2021-04-27 NOTE — Progress Notes (Signed)
Subjective:    Patient ID: [name], female    DOB: 1945-07-31, 76 y.o.   MRN: [id number]  HPI [name] is a 76 year old female with history of hemorrhoids, anal fissure,, recently confirmed rectocele and cystocele, pelvic floor dysfunction, history of constipation, history of GERD with esophagitis, asthma who is here for follow-up.  She is here alone today.  Since her last visit we performed an MRI pelvis which confirmed global pelvic floor laxity with mild cystocele and moderate anterior rectocele.  She reports that she continues to have issues with defecation.  She reports it can be hard to go certain days of the week.  She reports she cannot empty her rectum all at once and she tries to avoid using her hand to help aid with evacuation.  She always feels like some amount of stool is "stuck inside".  She does feel prolapse which leads to soreness.  Diltiazem has been helpful.  Occasional red blood with wiping.  She does go to senior exercise multiple days per week.  She has recently needed hydrocodone for neck pain.  She has continued with famotidine 40 mg twice daily which helps with her reflux with rare breakthrough.  Mylanta helps when she does have breakthrough.   Review of Systems As per HPI, otherwise negative  Current Medications, Allergies, Past Medical History, Past Surgical History, Family History and Social History were reviewed in [employer] record.    Objective:   Physical Exam BP 124/60    Pulse 83    Ht 5\' 1"  (1.549 m)    Wt 128 lb 3.2 oz (58.2 kg)    LMP 08/15/1988 (Exact Date)    SpO2 97%    BMI 24.22 kg/m  Gen: awake, alert, NAD HEENT: anicteric  Neuro: nonfocal   MRI PELVIS WITHOUT CONTRAST   TECHNIQUE: Multiplanar multisequence MR imaging of the pelvis was performed. No intravenous contrast was administered.   COMPARISON:  CT abdomen/pelvis dated 11/13/2015   FINDINGS: Urinary Tract: Mild cystocele with maximal strain  (series 9/image 38).   Bowel: Moderate anterior rectocele with maximal strain (series 9/image 22).   Vascular/Lymphatic: No evidence of aneurysm.   No suspicious pelvic lymphadenopathy.   Reproductive:  Uterus in situ.   No adnexal masses.   Other:  No pelvic ascites.   Musculoskeletal: Laxity of the pelvic floor/levator ani musculature (series 2/image 30).   IMPRESSION: Global pelvic floor laxity with mild cystocele and moderate anterior rectocele.     Electronically Signed   By: Julian Hy M.D.   On: 03/30/2021 20:25        Assessment & Plan:  76 year old female with history of hemorrhoids, anal fissure,, recently confirmed rectocele and cystocele, pelvic floor dysfunction, history of constipation, history of GERD with esophagitis, asthma who is here for follow-up.   Pelvic floor dysfunction/anterior rectocele with impaired defecation and incomplete evacuation --we discussed this diagnosis together today.  We reviewed the MRI.  I have made several recommendations and she is scheduled to see Dr. Maryland Pink with urogynecology in early May 2023.  We called to see if an earlier appointment was available but currently it is not. --Begin Benefiber 2 teaspoons daily; if tolerated this can be increased to twice daily --Can continue stool softener --Use a stool or squatty potty to raise her legs during defecation --Pelvic floor physical therapy referral --Diltiazem gel 2% 3 times daily as needed  2.  Mild constipation --intermittent, but bigger issue likely #1.  We are starting  Benefiber as above.  She is using hydrocodone though at low-dose for neck pain.  This can certainly exacerbate harder stool.  3.  GERD with history of esophagitis --continue famotidine 40 twice daily  30 minutes total spent today including patient facing time, coordination of care, reviewing medical history/procedures/pertinent radiology studies, and documentation of the encounter.

## 2021-04-28 ENCOUNTER — Other Ambulatory Visit: Payer: Self-pay | Admitting: Family Medicine

## 2021-04-28 DIAGNOSIS — M8580 Other specified disorders of bone density and structure, unspecified site: Secondary | ICD-10-CM

## 2021-05-13 ENCOUNTER — Other Ambulatory Visit: Payer: Self-pay

## 2021-05-13 ENCOUNTER — Encounter: Payer: Self-pay | Admitting: Physical Therapy

## 2021-05-13 ENCOUNTER — Encounter: Payer: PPO | Attending: Internal Medicine | Admitting: Physical Therapy

## 2021-05-13 DIAGNOSIS — R278 Other lack of coordination: Secondary | ICD-10-CM | POA: Diagnosis not present

## 2021-05-13 DIAGNOSIS — M6281 Muscle weakness (generalized): Secondary | ICD-10-CM | POA: Diagnosis not present

## 2021-05-13 NOTE — Patient Instructions (Signed)
Moisturizers ?They are used in the vagina to hydrate the mucous membrane that make up the vaginal canal. ?Designed to keep a more normal acid balance (ph) ?Once placed in the vagina, it will last between two to three days.  ?Use 2-3 times per week at bedtime  ?Ingredients to avoid is glycerin and fragrance, can increase chance of infection ?Should not be used just before sex due to causing irritation ?Most are gels administered either in a tampon-shaped applicator or as a vaginal suppository. They are non-hormonal. ? ? ?Types of Moisturizers(internal use) ? ?Vitamin E vaginal suppositories- Whole foods, Amazon ?Moist Again ?Coconut oil- can break down condoms ?Julva- (Do no use if on Tamoxifen) amazon ?Yes moisturizer- amazon ?NeuEve Silk , NeuEve Silver for menopausal or over 65 (if have severe vaginal atrophy or cancer treatments use NeuEve Silk for  1 month than move to [employer]epsi)- [employer], [place].com ?Olive and Bee intimate cream- www.oliveandbee.com.au ?Mae vaginal moisturizer- Amazon ?Aloe ? ? ? ?Creams to use externally on the Vulva area ?Desert [employer] Lee (good for for cancer patients that had radiation to the area)- [place] or [employer].FlyingBasics.com.br ?V-magic cream - amazon ?Julva-amazon ?Vital "V [place] salve ( help moisturize and help with thinning vulvar area, does have Beeswax ?[place] ?Desert Conseco ?Cleo by Damiva labial moisturizer ([place],  ?Coconut or olive oil ?aloe ? ? ?Things to avoid in the vaginal area ?Do not use things to irritate the vulvar area ?No lotions just specialized creams for the vulva area- Neogyn, V-magic, No soaps; can use Aveeno or Calendula cleanser if needed. Must be gentle ?No deodorants ?No douches ?Good to sleep without underwear to let the vaginal area to air out ?No scrubbing: spread the lips to let warm water rinse over labias and pat dry  ?Marland Kitchen ?[name], [name] ?[facility] ?[address],  [address] ?[place],  [address] ?W: [phone] ?[name] ? ?

## 2021-05-13 NOTE — Therapy (Addendum)
[place] ?[facility] at [facility] ?[address], [address] ?[place], [place], [address] ?Phone: [phone]   Fax:  [phone] ? ?OUTPATIENT PHYSICAL THERAPY FEMALE PELVIC EVALUATION ? ? ?Patient Name: [name] ?MRN: [id number] ?DOB:02/22/1946, 76 y.o., female ?Today's Date: 05/13/2021 ? ? PT End of Session - 05/13/21 1707   ? ? Visit Number 1   ? Date for PT Re-Evaluation 08/05/21   ? Authorization Type [employer]   ? PT Start Time 1600   ? PT Stop Time 1700   ? PT Time Calculation (min) 60 min   ? Activity Tolerance Patient tolerated treatment well   ? Behavior During Therapy [facility] for tasks assessed/performed   ? ?  ?  ? ?  ? ? ?Past Medical History:  ?Diagnosis Date  ? Anal fissure   ? Anxiety   ? sees [facility] Debbe Bales, [place])  ? Asthma   ? Colon polyps 08/2014  ? tubular adenoma and hyperplastic polyps (Dr. Hilarie Fredrickson)  ? Cystocele with rectocele 2023  ? DDD (degenerative disc disease), cervical dr Patrice Paradise  ? s/p surgery, ongoing pain  ? Depression   ? h/o hospitalization with ECT  ? Diverticulosis   ? Esophagitis   ? Hiatal hernia   ? History of insomnia   ? Hypertension   ? Internal hemorrhoids   ? Neck pain   ? Osteopenia stopped fosamax ~2006  ? Post-operative nausea and vomiting   ? Vertigo   ? Vitamin D deficiency borderline(28)-2010  ? treated by her GYN  ? ?Past Surgical History:  ?Procedure Laterality Date  ? CATARACT EXTRACTION, BILATERAL Bilateral Sept/October 2019  ? HEMORRHOID BANDING  12/2020  ? Dr. Hilarie Fredrickson  ? HEMORRHOID SURGERY  2006, 12/2014  ? KNEE ARTHROSCOPY Right   ? LASIK    ? Dr. Lucita Ferrara  ? NASAL SEPTOPLASTY W/ TURBINOPLASTY  1986  ? NECK SURGERY  02/2010([place])1/03,2005([place])  ? REDUCTION MAMMAPLASTY Bilateral 07/24/2008  ? TONSILLECTOMY    ? TOTAL HIP ARTHROPLASTY Left 08/2007  ?    ? TOTAL SHOULDER ARTHROPLASTY Left 08/30/2018  ? Procedure: TOTAL SHOULDER ARTHROPLASTY;  Surgeon: Justice Britain, MD;  Location: [facility];  Service: Orthopedics;  Laterality: Left;   147mn, move scope to 12:30pm  ? ?Patient Active Problem List  ? Diagnosis Date Noted  ? Basal cell carcinoma of skin 01/04/2021  ? History of neoplasm 01/04/2021  ? Osteoarthritis of ankle and foot 01/13/2020  ? Pain in left foot 01/13/2020  ? Pain in left knee 04/08/2019  ? Status post total shoulder arthroplasty, left 08/30/2018  ? DSAP (disseminated superficial actinic porokeratosis) 08/26/2018  ? Low back pain 08/16/2018  ? Osteoarthritis of left shoulder 08/13/2018  ? Vitamin D deficiency 01/08/2018  ? Osteopenia with high risk of fracture 01/08/2018  ? Essential hypertension 01/08/2018  ? Mixed conductive and sensorineural hearing loss of right ear with restricted hearing of left ear 09/29/2016  ? Fatigue 05/23/2016  ? Cold intolerance 05/23/2016  ? Depressive disorder, not elsewhere classified 11/30/2011  ? Spinal stenosis 11/30/2011  ? Neck pain on left side 11/30/2011  ? Asthma 12/05/2007  ? COUGH 12/05/2007  ? ? ?PCP: KRita Ohara MD ? ?REFERRING PROVIDER: PJerene Bears MD ? ?REFERRING DIAG: N81.6 REctocele; M62.89 Pelvic floor dysfunction ? ?THERAPY DIAG:  ?Other lack of coordination ? ?Muscle weakness (generalized) ? ?ONSET DATE: 12/29/2020 ? ?SUBJECTIVE:                                                                                                                                                                                          ? ?  SUBJECTIVE STATEMENT: ?Patient is having a rectocele. She has burning and itching rectum and vaginal area is sore.  ?Fluid intake: Yes: trying to drink more water   ? ?Patient confirms identification and approves PT to assess pelvic floor and treatment Yes ? ?PERTINENT HISTORY:  ?Osteoporosis; anal fissure; cystocele and rectocele, Hemorrhoid banding ?Sexual abuse: No ? ?PAIN:  ?Are you having pain? Yes ?NPRS scale: 4/10 rectum and vaginal ?Pain location: External, Anal, and Vaginal ? ?Pain type: burning and itching ?Pain description: intermittent  ? ?Aggravating  factors: when urinating, after a bowel movement ?Relieving factors: Vaseline ? ? ?BOWEL MOVEMENT ?Pain with bowel movement: Yes ?Type of bowel movement:Type (Bristol Stool Scale) Type 5, Frequency daily in AM after coffee, and Strain Yes ?Fully empty rectum: No ?Leakage: No ?Pads: No ?Fiber supplement: Yes: similar colace and helping ? ?URINATION ?Pain with urination: Yes ?Fully empty bladder: No ?Stream: Strong ?Urgency: Yes: no leakage ?Frequency: normal ?Leakage:  none ?Pads: No ? ?INTERCOURSE ?Pain with intercourse:  none ? ?PREGNANCY ?Vaginal deliveries 3 ?Tearing No ? ?PROLAPSE ?Cystocele   and Rectocele   ? ?PRECAUTIONS: Other: osteoporosis ? ?WEIGHT BEARING RESTRICTIONS No ? ?FALLS:  ?Has patient fallen in last 6 months? No, Number of falls: 0 ? ?LIVING ENVIRONMENT: ?Lives with: lives with their family and lives with their spouse ?Lives in: House/apartment ? ?OCCUPATION: retired, was a Pharmacist, hospital, [employer] and works out with aerobics, yoga ? ?PLOF: Independent ? ?PATIENT GOALS learn some exercises ? ? ?OBJECTIVE:  ? ?DIAGNOSTIC FINDINGS:  ?none ? ?PATIENT SURVEYS:  ?None at this time ? ?PFIQ-7 none ? ?COGNITION: ? Overall cognitive status: Within functional limits for tasks assessed   ?  ?SENSATION: ? Light touch: Appears intact ? Proprioception: Appears intact ? ? ?POSTURE:  ?good ? ?PALPATION: ?Internal Pelvic Floor vaginal: rectocele noted, tightness along the puborectalis, tightness in the introitus, and levator ani; Rectally: Patient able to bulge the rectum to push the therapist finger out, she has some tightness in the anterior wall of the rectum and puborectalis   does not move forward well.  ? ?External Perineal Exam Patient clitoris is hidden behind the clitoral hood. The inside of the labia minora has red patches where the layer of skin has come off due to the dryness and thinning of the area. The perineal body is white and dry with decreased mobility.  ? ?GENERAL Patient will lift her chest to  contract her pelvic floor ? ? (Blank rows = not tested) ? ? ?LE MMT: ? ?MMT Right ?05/13/2021 Left ?05/13/2021  ?Hip flexion    ?Hip extension 4/5 4/5  ?Hip abduction 3+/5 3+/5  ?PELVIC MMT: ?  ?MMT  ?05/13/2021  ?Vaginal 2/5  ?Internal Anal Sphincter 3/5  ?External Anal Sphincter 3/5  ?Puborectalis 1/5  ?Diastasis Recti   ?(Blank rows = not tested) ? ? ?TONE: ?Increased tone and difficulty with relaxing the pelvic floor ? ?PROLAPSE: ?Rectocele at the entrance ? ? ? ? ?TODAY'S TREATMENT  ?EVAL completed the evaluation ? ? ?PATIENT EDUCATION:  ?Education details: educated patient on what a rectocele is, about the vulvar and what is happening to the vulva tissue, educated patient on why vaginal moisturizers are important and how they can help the tissue; gave patient samples of vaginal moisturizers ?Person educated: Patient ?Education method: Explanation and Handouts ?Education comprehension: verbalized understanding ? ? ?HOME EXERCISE PROGRAM: ?Moisturizing the vaginal tissue and how ? ?ASSESSMENT: ? ?CLINICAL IMPRESSION: ?Patient is a 76 y.o. female  who was seen today for  physical therapy evaluation and treatment for rectocele and pelvic floor dysfunction. Patient  reports she is not straining to have a bowel movement at this time due to taking a laxative and she is not feeling the rectocele as much. She is having type 5 bowel movements daily now. Patient continues to have burning and itching in the vulva and rectal area at level 4/10. This happens with a bowel movement and when she urinates. Her vulva skin is thin with red patches that look raw. The clitoral hood is adhered over the clitoris so unable to see it.  She has decreased tissue mobility of the perineal body and it is a Jebidiah Baggerly color. Rectocele is at the entrance of the introitus. Her vaginal strength is 2/5 and not able to relax her pelvic floor. She will lift her chest to contract the pelvic floor. Rectal strength is 3/5 except for the puborectalis is 1/5.  She was able to bear down to push the therapist finger out of the rectum. She is going to see Dr. Zigmund Daniel, urogynecologist, to assess her rectocele and possible surgery. Patient will benefit from skilled ther

## 2021-06-03 ENCOUNTER — Encounter: Payer: Self-pay | Admitting: Family Medicine

## 2021-06-03 ENCOUNTER — Ambulatory Visit (INDEPENDENT_AMBULATORY_CARE_PROVIDER_SITE_OTHER): Payer: PPO | Admitting: Family Medicine

## 2021-06-03 VITALS — BP 112/68 | HR 80 | Ht 61.0 in | Wt 129.0 lb

## 2021-06-03 DIAGNOSIS — I10 Essential (primary) hypertension: Secondary | ICD-10-CM | POA: Diagnosis not present

## 2021-06-03 DIAGNOSIS — E559 Vitamin D deficiency, unspecified: Secondary | ICD-10-CM

## 2021-06-03 DIAGNOSIS — N898 Other specified noninflammatory disorders of vagina: Secondary | ICD-10-CM | POA: Diagnosis not present

## 2021-06-03 DIAGNOSIS — K2100 Gastro-esophageal reflux disease with esophagitis, without bleeding: Secondary | ICD-10-CM

## 2021-06-03 DIAGNOSIS — G8929 Other chronic pain: Secondary | ICD-10-CM

## 2021-06-03 DIAGNOSIS — F329 Major depressive disorder, single episode, unspecified: Secondary | ICD-10-CM | POA: Diagnosis not present

## 2021-06-03 DIAGNOSIS — R5383 Other fatigue: Secondary | ICD-10-CM | POA: Diagnosis not present

## 2021-06-03 DIAGNOSIS — Z5181 Encounter for therapeutic drug level monitoring: Secondary | ICD-10-CM

## 2021-06-03 DIAGNOSIS — M542 Cervicalgia: Secondary | ICD-10-CM

## 2021-06-03 NOTE — Progress Notes (Signed)
Chief Complaint  ?Patient presents with  ? Consult  ?  Having some issues with pain, saw Dr. Saintclair Halsted for this and he cannot do anything about this (lbp and neck). Also was having vaginal rash/baterial outpatient physical therapy female pelvic evaluation. She has no energy after lunch. Just doesn't feel good.   ? ?Patient just "isn't feeling good". ? ?Recent visits reviewed.   ?Saw Dr. Hilarie Fredrickson, dx of rectocele, pelvic floor dysfunction. ?Referred to Dr. Zigmund Daniel with urogynecology.  It was recommended she take Benefiber, stool softener, pelvic floor PT--had visit 3/16 with PT. ?She was told to take Pepcid BID for GERD.  Heartburn resolved.  She admits she cut back to just 1 tablet at bedtime.  Denies heartburn, epigastric pain. ? ?She started on colace 2/d, thought it made her more tired, so cut back to 1/d instead. ?She continues to have painful bowel movements with some bleeding.  Some days are easier than others. ?Bleeding is just a small amount on toilet paper, not ongoing. ? ?She has ongoing issues with vaginal itch.  She reports that she saw Dr. Talbert Nan twice. She brings in tubes of both betamethasone, and clobetasol, neither of which had helped, nor did use of vaseline. ?She reports that the pelvic floor physical therapist told her to stop vaseline.  To use coconut or olive oil instead. She has also been using Aloe. It feels better at night, but has recurrent itching in the morning, starting after she goes to the bathroom and voids. ?It is causing her a lot of stress/worry about this itching. ? ?She complains of constant pain.  Saw Dr. Saintclair Halsted after MRI (neck and lumbar? Pt not sure, no records in chart), told nothing surgical. He recommended PT, but she tried that 3x with Brad at [facility], so is hesitant to try PT again. She recalls dry needling being very painful, and didn't help. ?She goes to exercise classes at [employer] 3x/week. ?Has TENS unit at home, uses it every other day. Also uses a Therapist, nutritional. ?Pain in LBP x 2 years, pain is not all the time. The neck pain is constant. ?She was started on Meloxicam after MRI by Dr. Saintclair Halsted, around December. Denies GI side effects.  Switched from 7.5 BID to '15mg'$  once daily. She can't tell if it makes a difference in her pain. ?She used to take hydrocodone q2-3 days.  Pain is worse, so has been taking 1/2 tablet every day. Takes it after lunch, that is when pain kicks in.  The hydrocodone helps with the pain. ?Feels more tired prior to taking the pain med, when her pain is the worst; energy improves as pain improves. ? ?Jenel Lucks does help some, still uses this some. ? ?Last TSH 08/2020. More fatigue, sleeping more only in the last month.  No changes to hair/nails. Some dry skin, typical for winter. ? ?Depression--sees  Noemi Chapel She apparently suggested Savella to help her pain. Only psych med she is currently taking is Lamictal. ? ?Vitamin D deficiency--taking MVI and a daily D. ? ?Hypertension: compliant with amlodipine. She denies lightheadedness, syncope, chest pain, shortness of breath. ?BP Readings from Last 3 Encounters:  ?06/03/21 112/68  ?04/27/21 124/60  ?03/02/21 112/68  ? ?PMH, PSH, SH reviewed ? ?Outpatient Encounter Medications as of 06/03/2021  ?Medication Sig Note  ? amLODipine (NORVASC) 5 MG tablet Take 1 tablet (5 mg total) by mouth daily.   ? B Complex-C (B-COMPLEX WITH VITAMIN C) tablet Take 1 tablet by mouth 2 (  two) times a day. 08/27/2018: Takes B-complex 100 once daily  ? Calcium Carbonate-Vit D-Min (CALCIUM 1200 PO) Take 2,400 mg by mouth daily.   ? diltiazem 2 % GEL Using your index finger, apply a small amount of medication inside the rectum up to your first knuckle/joint twice daily x 8 weeks.   ? docusate sodium (COLACE) 100 MG capsule Take 100 mg by mouth daily. 06/03/2021: Takes one qhs  ? famotidine (PEPCID) 40 MG tablet TAKE 1 TABLET BY MOUTH TWICE A DAY 06/03/2021: Once daily  ? HYDROcodone-acetaminophen (NORCO/VICODIN) 5-325 MG tablet  Take 0.5 tablets by mouth every 6 (six) hours as needed for moderate pain. 06/03/2021: 1/2 tab daily  ? lamoTRIgine (LAMICTAL) 150 MG tablet Take 150 mg by mouth at bedtime.   ? Liniments (SALONPAS PAIN RELIEF PATCH EX) Place 1 patch onto the skin daily as needed (pain).   ? meloxicam (MOBIC) 15 MG tablet Take 15 mg by mouth daily.   ? Multiple Vitamins-Minerals (CENTRUM SILVER 50+WOMEN PO) Take 1 tablet by mouth.   ? risedronate (ACTONEL) 150 MG tablet take 1 tablet by mouth every 30 days with water on empty stomach,nothing by mouth or lie down for next 30 minutes   ? zolpidem (AMBIEN) 10 MG tablet Take 2.5 mg by mouth at bedtime.  06/03/2021: Takes every night (1/4 tablet or less)  ? [DISCONTINUED] Multiple Vitamins-Minerals (HAIR SKIN AND NAILS FORMULA PO) Take 1 tablet by mouth daily.    ? clobetasol ointment (TEMOVATE) 0.05 % Apply a small amount topically BID x 2 weeks (Patient not taking: Reported on 06/03/2021)   ? FLOVENT HFA 110 MCG/ACT inhaler Inhale 2 puffs into the lungs 2 (two) times daily. (Patient not taking: Reported on 06/03/2021) 06/03/2021: prn  ? PROAIR HFA 108 (90 Base) MCG/ACT inhaler Inhale 2 puffs into the lungs every 4 (four) hours as needed. (Patient not taking: Reported on 06/03/2021) 06/03/2021: prn  ? ?No facility-administered encounter medications on file as of 06/03/2021.  ? ?Allergies  ?Allergen Reactions  ? Cymbalta [Duloxetine Hcl]   ?  Abdominal pain  ? Nitrofurantoin [Macrodantin]   ? Sulfamethoxazole Hives  ? Cefdinir Rash  ? ?ROS: no fever, chills, URI symptoms, n/v/d. +constipation, pain/bleeding with defection intermittently. Denies heartburn/reflux. No dysphagia. ?+fatigue, depression, per HPI.  No LH/syncope. +vaginal itching.   ?Neck and back pain per HPI. ?See HPI. ? ? ?PHYSICAL EXAM: ? ?BP 112/68   Pulse 80   Ht '5\' 1"'$  (1.549 m)   Wt 129 lb (58.5 kg)   LMP 08/15/1988 (Exact Date)   BMI 24.37 kg/m?  ? ?Wt Readings from Last 3 Encounters:  ?06/03/21 129 lb (58.5 kg)  ?04/27/21 128 lb  3.2 oz (58.2 kg)  ?03/02/21 129 lb (58.5 kg)  ? ?Pleasant female, who appears tired and somewhat frustrated. She does not appear to be in any distress. ?HEENT: conjunctiva and sclera are clear, EOMI.  ?Neck: no anterior lymphadenopathy or thyromegaly. ?Posteriorly, lateral to paraspinous m/trap there is a mobile, soft nodule, measuring approx 1.5cm.  This is tender. (She states her neck pain seems to stem from this) ?Heart: regular rate and rhythm ?Lungs: clear bilaterally ?Back: no spinal or CVA tenderness ?Abdomen: soft, nontender, no organomegaly or mass ?Psych: appears mildly depressed.  Full range of affect. Normal eye contact, speech, hygiene and grooming ?GU exam not performed (has seen GYN, and has appt with urogyn) ? ? ?ASSESSMENT/PLAN: ? ?Fatigue, unspecified type - Ddx reviewed in detail; increased narcotic use, use of NSAID, allergies,  depression may all play a role. Check labs - Plan: Comprehensive metabolic panel, CBC with Differential/Platelet, VITAMIN D 25 Hydroxy (Vit-D Deficiency, Fractures) ? ?Vitamin D deficiency - Plan: VITAMIN D 25 Hydroxy (Vit-D Deficiency, Fractures) ? ?Medication monitoring encounter - Plan: Comprehensive metabolic panel, CBC with Differential/Platelet, VITAMIN D 25 Hydroxy (Vit-D Deficiency, Fractures) ? ?Essential hypertension - well controlled ? ?Gastroesophageal reflux disease with esophagitis without hemorrhage - well controlled on once daily pepcid; cont, especially while taking daily NSAID ? ?Chronic neck pain - To consider PT as suggested by her neurosurg. Pt to try and get US neck MRI results. ?if w/u needs to be done for poss LAD L posterior neck ? ?Vaginal itching - didn't improve with topical steroids x2 or vaseline. Pat dry, avoid scratching. To d/w specialist at upcoming appt ? ?Major depressive disorder with current active episode, unspecified depression episode severity, unspecified whether recurrent - Cont to f/u with Noemi Chapel. Chronic pain contributing  to worsening depression. Consider SNRI (I'd suggest cymbalta rather than Savella, if hasn't taken) ? ? ?

## 2021-06-03 NOTE — Patient Instructions (Addendum)
Please continue to work with your psychiatrist/therapist on dealing with chronic pain.  ?I think it would be reasonable to consider an SNRI as suggested by your psychiatrist (duloxetine/cymbalta, I believe you said she suggested Savella). ? ?Considering something like biofeedback, or other "pain reprocessing" for chronic pain may be helpful.  A friend of mine mentioned "[employer]" App--I honestly haven't looked into.  He also suggested a physical therapist named Apolonio Schneiders at [facility] ? ?Honestly, if the meloxicam isn't helping, there is probably more risk than benefit to continuing it. We are checking your blood today to ensure no harm is coming from the medication.  If labs are normal, you can try stopping it, and if your pain is worse, you can restart it.  (You will be told NOT to restart it if your kidneys/liver are abnormal, or if any anemia has developed). ? ?Please remain on the pepcid while taking the meloxicam, as this protects your stomach from ulcers and gastritis from the medication. ? ?Please follow up with your gynecologist (vs the new urogynecologist) regarding your vaginal itching.  Steroids have not worked at all.  Question would be any benefit form topical premarin creams. ?Since the itching seems to start after going to the bathroom, consider the toilet paper as a contributing issue--ensure scent-free, and soft.  Try and pat dry rather than wipe. ? ?If you can get the copies of the MRI reports from Dr. Windy Carina office (or for him to send his records) that would be nice.  I do feel the "knot" that you feel.  ? If this is an enlarged lymph node. I'm wondering if this showed up on the MRI, versus do we need to do a CT scan of the neck. ? ? ? ?

## 2021-06-04 LAB — CBC WITH DIFFERENTIAL/PLATELET
Basophils Absolute: 0 10*3/uL (ref 0.0–0.2)
Basos: 0 %
EOS (ABSOLUTE): 0.1 10*3/uL (ref 0.0–0.4)
Eos: 2 %
Hematocrit: 37.7 % (ref 34.0–46.6)
Hemoglobin: 12.7 g/dL (ref 11.1–15.9)
Immature Grans (Abs): 0 10*3/uL (ref 0.0–0.1)
Immature Granulocytes: 0 %
Lymphocytes Absolute: 1.6 10*3/uL (ref 0.7–3.1)
Lymphs: 23 %
MCH: 29.7 pg (ref 26.6–33.0)
MCHC: 33.7 g/dL (ref 31.5–35.7)
MCV: 88 fL (ref 79–97)
Monocytes Absolute: 0.5 10*3/uL (ref 0.1–0.9)
Monocytes: 8 %
Neutrophils Absolute: 4.4 10*3/uL (ref 1.4–7.0)
Neutrophils: 67 %
Platelets: 314 10*3/uL (ref 150–450)
RBC: 4.27 x10E6/uL (ref 3.77–5.28)
RDW: 12.7 % (ref 11.7–15.4)
WBC: 6.7 10*3/uL (ref 3.4–10.8)

## 2021-06-04 LAB — COMPREHENSIVE METABOLIC PANEL
ALT: 19 IU/L (ref 0–32)
AST: 19 IU/L (ref 0–40)
Albumin/Globulin Ratio: 2.8 — ABNORMAL HIGH (ref 1.2–2.2)
Albumin: 4.7 g/dL (ref 3.7–4.7)
Alkaline Phosphatase: 82 IU/L (ref 44–121)
BUN/Creatinine Ratio: 19 (ref 12–28)
BUN: 13 mg/dL (ref 8–27)
Bilirubin Total: 0.2 mg/dL (ref 0.0–1.2)
CO2: 23 mmol/L (ref 20–29)
Calcium: 9.1 mg/dL (ref 8.7–10.3)
Chloride: 101 mmol/L (ref 96–106)
Creatinine, Ser: 0.69 mg/dL (ref 0.57–1.00)
Globulin, Total: 1.7 g/dL (ref 1.5–4.5)
Glucose: 92 mg/dL (ref 70–99)
Potassium: 4.1 mmol/L (ref 3.5–5.2)
Sodium: 138 mmol/L (ref 134–144)
Total Protein: 6.4 g/dL (ref 6.0–8.5)
eGFR: 89 mL/min/{1.73_m2} (ref >59)

## 2021-06-04 LAB — VITAMIN D 25 HYDROXY (VIT D DEFICIENCY, FRACTURES): Vit D, 25-Hydroxy: 31.1 ng/mL (ref 30.0–100.0)

## 2021-06-10 ENCOUNTER — Encounter: Payer: PPO | Admitting: Physical Therapy

## 2021-06-24 ENCOUNTER — Encounter: Payer: PPO | Admitting: Physical Therapy

## 2021-07-01 DIAGNOSIS — L900 Lichen sclerosus et atrophicus: Secondary | ICD-10-CM | POA: Diagnosis not present

## 2021-07-01 DIAGNOSIS — R82998 Other abnormal findings in urine: Secondary | ICD-10-CM | POA: Diagnosis not present

## 2021-07-01 DIAGNOSIS — R319 Hematuria, unspecified: Secondary | ICD-10-CM | POA: Diagnosis not present

## 2021-07-01 DIAGNOSIS — K5902 Outlet dysfunction constipation: Secondary | ICD-10-CM | POA: Diagnosis not present

## 2021-07-01 DIAGNOSIS — N390 Urinary tract infection, site not specified: Secondary | ICD-10-CM | POA: Diagnosis not present

## 2021-07-01 DIAGNOSIS — N816 Rectocele: Secondary | ICD-10-CM | POA: Diagnosis not present

## 2021-07-01 DIAGNOSIS — R8290 Unspecified abnormal findings in urine: Secondary | ICD-10-CM | POA: Diagnosis not present

## 2021-07-06 ENCOUNTER — Telehealth: Payer: Self-pay | Admitting: Pharmacist

## 2021-07-06 NOTE — Progress Notes (Signed)
Chronic Care Management Pharmacy Assistant   Name: [name]  MRN: [id number] DOB: 01/07/1946  Reason for Encounter: Disease State   Conditions to be addressed/monitored: HTN  Recent office visits:  06/03/21 Maria Ohara, MD - Patient presented for Fatigue unspecified and other concerns. No medication changes.  Recent consult visits:  07/05/21 Maria Lofts, RN  (Urology) - Patient presented for Televisit for UTI. Prescribed Fosfomycin 3 grams  07/01/21 [name] (Urological GYN) - Patient presented for Rectocele and other concerns. No other visit details available.  05/13/21 [name], PT (PT) - Patient presented for Other lack of coordination and other concerns.  04/27/21 [name], Maria Lines, MD [name]) - Patient presented for Rectocele and other concerns. Recommended to start Benefiber 2 tsp daily and Diltiazem gel 2% PRN  Hospital visits:  None in previous 6 months  Medications: Outpatient Encounter Medications as of 07/06/2021  Medication Sig Note   amLODipine (NORVASC) 5 MG tablet Take 1 tablet (5 mg total) by mouth daily.    B Complex-C (B-COMPLEX WITH VITAMIN C) tablet Take 1 tablet by mouth 2 (two) times a day. 08/27/2018: Takes B-complex 100 once daily   Calcium Carbonate-Vit D-Min (CALCIUM 1200 PO) Take 2,400 mg by mouth daily.    clobetasol ointment (TEMOVATE) 0.05 % Apply a small amount topically BID x 2 weeks (Patient not taking: Reported on 06/03/2021)    diltiazem 2 % GEL Using your index finger, apply a small amount of medication inside the rectum up to your first knuckle/joint twice daily x 8 weeks.    docusate sodium (COLACE) 100 MG capsule Take 100 mg by mouth daily. 06/03/2021: Takes one qhs   famotidine (PEPCID) 40 MG tablet TAKE 1 TABLET BY MOUTH TWICE A DAY 06/03/2021: Once daily   FLOVENT HFA 110 MCG/ACT inhaler Inhale 2 puffs into the lungs 2 (two) times daily. (Patient not taking: Reported on 06/03/2021) 06/03/2021: prn   HYDROcodone-acetaminophen  (NORCO/VICODIN) 5-325 MG tablet Take 0.5 tablets by mouth every 6 (six) hours as needed for moderate pain. 06/03/2021: 1/2 tab daily   lamoTRIgine (LAMICTAL) 150 MG tablet Take 150 mg by mouth at bedtime.    Liniments (SALONPAS PAIN RELIEF PATCH EX) Place 1 patch onto the skin daily as needed (pain).    meloxicam (MOBIC) 15 MG tablet Take 15 mg by mouth daily.    Multiple Vitamins-Minerals (CENTRUM SILVER 50+WOMEN PO) Take 1 tablet by mouth.    PROAIR HFA 108 (90 Base) MCG/ACT inhaler Inhale 2 puffs into the lungs every 4 (four) hours as needed. (Patient not taking: Reported on 06/03/2021) 06/03/2021: prn   risedronate (ACTONEL) 150 MG tablet take 1 tablet by mouth every 30 days with water on empty stomach,nothing by mouth or lie down for next 30 minutes    zolpidem (AMBIEN) 10 MG tablet Take 2.5 mg by mouth at bedtime.  06/03/2021: Takes every night (1/4 tablet or less)   No facility-administered encounter medications on file as of 07/06/2021.  Reviewed chart prior to disease state call. Spoke with patient regarding BP  Recent Office Vitals: BP Readings from Last 3 Encounters:  06/03/21 112/68  04/27/21 124/60  03/02/21 112/68   Pulse Readings from Last 3 Encounters:  06/03/21 80  04/27/21 83  03/02/21 97    Wt Readings from Last 3 Encounters:  06/03/21 129 lb (58.5 kg)  04/27/21 128 lb 3.2 oz (58.2 kg)  03/02/21 129 lb (58.5 kg)     Kidney Function Lab Results  Component  Value Date/Time   CREATININE 0.69 06/03/2021 03:41 PM   CREATININE 0.72 09/10/2020 09:10 AM   CREATININE 0.65 05/23/2016 11:10 AM   CREATININE 0.67 07/06/2015 08:37 AM   GFR 102.41 11/06/2015 02:57 PM   GFRNONAA 90 12/18/2019 12:00 AM   GFRAA 104 12/18/2019 12:00 AM       Latest Ref Rng & Units 06/03/2021    3:41 PM 09/10/2020    9:10 AM 12/18/2019   12:00 AM  BMP  Glucose 70 - 99 mg/dL 92   98   95    BUN 8 - 27 mg/dL '13   20   17    '$ Creatinine 0.57 - 1.00 mg/dL 0.69   0.72   0.58    BUN/Creat Ratio 12 - '28 19    28   29    '$ Sodium 134 - 144 mmol/L 138   136   136    Potassium 3.5 - 5.2 mmol/L 4.1   4.5   3.8    Chloride 96 - 106 mmol/L 101   96   99    CO2 20 - 29 mmol/L '23   25   27    '$ Calcium 8.7 - 10.3 mg/dL 9.1   8.7   9.9      Current antihypertensive regimen:  Amlodipine 5 mg 1 tablet daily - Appropriate, Effective, Safe, Accessible How often are you checking your Blood Pressure? weekly Current home BP readings: Patient reports 118/75 she denies any hyper/hypotensive systems. What recent interventions/DTPs have been made by any provider to improve Blood Pressure control since last CPP Visit: Patient reports no changes. Any recent hospitalizations or ED visits since last visit with CPP? No Patient reports she was out in traffic when she returned my call and would call me at a later time to schedule Follow up with MP  Adherence Review: Is the patient currently on ACE/ARB medication? No Does the patient have >5 day gap between last estimated fill dates? No     Care Gaps: BP- 112/68 ( 06/03/21) AWV - 7/22 CCM - need aug/sept pt to call  Star Rating Drugs: None    Ned Clines [place] Clinical Pharmacist Assistant [phone]

## 2021-07-08 DIAGNOSIS — M5451 Vertebrogenic low back pain: Secondary | ICD-10-CM | POA: Diagnosis not present

## 2021-07-15 DIAGNOSIS — N952 Postmenopausal atrophic vaginitis: Secondary | ICD-10-CM | POA: Diagnosis not present

## 2021-07-15 DIAGNOSIS — L900 Lichen sclerosus et atrophicus: Secondary | ICD-10-CM | POA: Diagnosis not present

## 2021-07-15 DIAGNOSIS — N816 Rectocele: Secondary | ICD-10-CM | POA: Diagnosis not present

## 2021-07-20 ENCOUNTER — Encounter: Payer: PPO | Admitting: Physical Therapy

## 2021-07-27 ENCOUNTER — Encounter: Payer: PPO | Admitting: Physical Therapy

## 2021-07-29 ENCOUNTER — Ambulatory Visit: Payer: PPO | Admitting: Podiatry

## 2021-08-05 ENCOUNTER — Encounter: Payer: PPO | Admitting: Physical Therapy

## 2021-08-10 ENCOUNTER — Encounter: Payer: PPO | Admitting: Physical Therapy

## 2021-08-17 ENCOUNTER — Encounter: Payer: Self-pay | Admitting: Physician Assistant

## 2021-08-17 ENCOUNTER — Telehealth: Payer: Self-pay | Admitting: Internal Medicine

## 2021-08-17 ENCOUNTER — Ambulatory Visit (INDEPENDENT_AMBULATORY_CARE_PROVIDER_SITE_OTHER): Payer: PPO | Admitting: Physician Assistant

## 2021-08-17 ENCOUNTER — Telehealth: Payer: Self-pay

## 2021-08-17 VITALS — BP 110/60 | HR 91 | Ht 61.0 in | Wt 126.0 lb

## 2021-08-17 DIAGNOSIS — R1032 Left lower quadrant pain: Secondary | ICD-10-CM

## 2021-08-17 DIAGNOSIS — N816 Rectocele: Secondary | ICD-10-CM

## 2021-08-17 DIAGNOSIS — K5900 Constipation, unspecified: Secondary | ICD-10-CM

## 2021-08-17 MED ORDER — CIPROFLOXACIN HCL 500 MG PO TABS: 500.0000 mg | ORAL_TABLET | Freq: Three times a day (TID) | ORAL | 0 refills | Status: DC

## 2021-08-17 MED ORDER — METRONIDAZOLE 500 MG PO TABS: 500.0000 mg | ORAL_TABLET | Freq: Two times a day (BID) | ORAL | 0 refills | Status: DC

## 2021-08-17 NOTE — Telephone Encounter (Signed)
Pt states she started having abd pain and extreme cramping/stomach pressure. Reports it started about a week ago but has gotten worse. She also reports a spot in her LLQ that is very sore to touch. Pt scheduled to see Ellouise Newer PA today at 10am.

## 2021-08-17 NOTE — Telephone Encounter (Signed)
Inbound call from patient stating that she has an appointment on 7/11 at 9:00 with Alonza Bogus for rectal bleeding. Patient is requesting a call back to discuss symptoms and to see if she needs to go to hospital. Please advise.

## 2021-08-17 NOTE — Patient Instructions (Signed)
If you are age 78 or older, your body mass index should be between 23-30. Your Body mass index is 23.81 kg/m. If this is out of the aforementioned range listed, please consider follow up with your Primary Care Provider.  If you are age 54 or younger, your body mass index should be between 19-25. Your Body mass index is 23.81 kg/m. If this is out of the aformentioned range listed, please consider follow up with your Primary Care Provider.   ________________________________________________________  The [facility] providers would like to encourage you to use [facility] to communicate with providers for non-urgent requests or questions.  Due to long hold times on the telephone, sending your provider a message by [facility] may be a faster and more efficient way to get a response.  Please allow 48 business hours for a response.  Please remember that this is for non-urgent requests.  _______________________________________________________  We have sent the following medications to your pharmacy for you to pick up at your convenience:  Cipro and Flagyl  Please call back if you are not better within 48 hours

## 2021-08-17 NOTE — Telephone Encounter (Signed)
Cipro sent as TID by mistake.  Spoke to pharmacist and changed it to BID

## 2021-08-17 NOTE — Progress Notes (Signed)
Chief Complaint: Left lower quadrant pain  HPI:    [name] is a 76 year old Caucasian female with a past medical history of a cystocele with rectocele, anal fissure, pelvic floor dysfunction, constipation, GERD with esophagitis, anxiety and multiple others listed below, known to Dr. Hilarie Fredrickson, who presents to clinic today with a complaint of left lower quadrant pain.    06/2019 colonoscopy for follow-up of adenomatous colon polyps with multiple sigmoid and left colon diverticuli and small internal hemorrhoids.    04/27/2021 office visit with Dr. Hilarie Fredrickson at which time discussed recent MRI of the pelvis which was performed and showed global pelvic floor laxity with mild cystocele and moderate anterior rectocele.  She continued to have problems with defecation.  Felt like stool was getting "stuck inside".  She was continuing Famotidine 40 twice a day which helped with reflux.  At that time she was scheduled to see urogynecology.  She was started on a fiber supplement and a stool softener.  Also prescribed Diltiazem gel 2% 3 times daily as needed for future.    07/15/2021 patient followed with urological gynecology in regards to rectocele and at that time had some symptoms of obstructive defecation.  Pelvic organ prolapse repair transvaginally was discussed and she was going to think about this over the next 3 months prior to her next visit.    Today, the patient presents to clinic and tells me that over the past week she has developed a left lower quadrant pain that at first she thought was from coughing too much with an upper respiratory infection/bronchitis but yesterday her stomach started getting swollen across the bottom and felt like she was having contractions about every hour and would have pain rated as a 10/10 which "doubled me over", this would last for a couple of minutes and then eases off, "like a contraction".  She did not have a good bowel movement yesterday with only a little bit of stool, but  this morning had a very good bowel movement which did help with the pain slightly.  No nausea or vomiting or fever but she has had a decrease in appetite and has really not had much to eat at all.  Denies seeing any further blood in her stool.  She continues Famotidine 40 mg twice daily and a stool softener.    Denies fever, chills, weight loss, further blood in her stool or symptoms that awaken her from sleep.  Past Medical History:  Diagnosis Date   Anal fissure    Anxiety    sees [facility]be Bales, [place])   Asthma    Colon polyps 08/2014   tubular adenoma and hyperplastic polyps (Dr. Hilarie Fredrickson)   Cystocele with rectocele 2023   DDD (degenerative disc disease), cervical dr Patrice Paradise   s/p surgery, ongoing pain   Depression    h/o hospitalization with ECT   Diverticulosis    Esophagitis    Hiatal hernia    History of insomnia    Hypertension    Internal hemorrhoids    Neck pain    Osteopenia stopped fosamax ~2006   Post-operative nausea and vomiting    Vertigo    Vitamin D deficiency borderline(28)-2010   treated by her GYN    Past Surgical History:  Procedure Laterality Date   CATARACT EXTRACTION, BILATERAL Bilateral Sept/October 2019   HEMORRHOID BANDING  12/2020   Dr. Hilarie Fredrickson   HEMORRHOID SURGERY  2006, 12/2014   KNEE ARTHROSCOPY Right    LASIK  Dr. Lucita Ferrara   NASAL SEPTOPLASTY W/ TURBINOPLASTY  1986   NECK SURGERY  02/2010([facility])1/03,2005([facility])   REDUCTION MAMMAPLASTY Bilateral 07/24/2008   TONSILLECTOMY     TOTAL HIP ARTHROPLASTY Left 08/2007       TOTAL SHOULDER ARTHROPLASTY Left 08/30/2018   Procedure: TOTAL SHOULDER ARTHROPLASTY;  Surgeon: Justice Britain, MD;  Location: [facility];  Service: Orthopedics;  Laterality: Left;  128mn, move scope to 12:30pm    Current Outpatient Medications  Medication Sig Dispense Refill   amLODipine (NORVASC) 5 MG tablet Take 1 tablet (5 mg total) by mouth daily. 90 tablet 3   B Complex-C (B-COMPLEX WITH VITAMIN C) tablet Take 1  tablet by mouth 2 (two) times a day.     Calcium Carbonate-Vit D-Min (CALCIUM 1200 PO) Take 2,400 mg by mouth daily.     clobetasol ointment (TEMOVATE) 0.05 % Apply a small amount topically BID x 2 weeks (Patient not taking: Reported on 06/03/2021) 30 g 0   diltiazem 2 % GEL Using your index finger, apply a small amount of medication inside the rectum up to your first knuckle/joint twice daily x 8 weeks. 30 g 1   docusate sodium (COLACE) 100 MG capsule Take 100 mg by mouth daily.     famotidine (PEPCID) 40 MG tablet TAKE 1 TABLET BY MOUTH TWICE A DAY 180 tablet 1   FLOVENT HFA 110 MCG/ACT inhaler Inhale 2 puffs into the lungs 2 (two) times daily. (Patient not taking: Reported on 06/03/2021)     HYDROcodone-acetaminophen (NORCO/VICODIN) 5-325 MG tablet Take 0.5 tablets by mouth every 6 (six) hours as needed for moderate pain.     lamoTRIgine (LAMICTAL) 150 MG tablet Take 150 mg by mouth at bedtime.     Liniments (SALONPAS PAIN RELIEF PATCH EX) Place 1 patch onto the skin daily as needed (pain).     meloxicam (MOBIC) 15 MG tablet Take 15 mg by mouth daily.     Multiple Vitamins-Minerals (CENTRUM SILVER 50+WOMEN PO) Take 1 tablet by mouth.     PROAIR HFA 108 (90 Base) MCG/ACT inhaler Inhale 2 puffs into the lungs every 4 (four) hours as needed. (Patient not taking: Reported on 06/03/2021)     risedronate (ACTONEL) 150 MG tablet take 1 tablet by mouth every 30 days with water on empty stomach,nothing by mouth or lie down for next 30 minutes 4 tablet 0   zolpidem (AMBIEN) 10 MG tablet Take 2.5 mg by mouth at bedtime.      No current facility-administered medications for this visit.    Allergies as of 08/17/2021 - Review Complete 06/03/2021  Allergen Reaction Noted   Cymbalta [duloxetine hcl]  04/09/2013   Nitrofurantoin [macrodantin]  07/07/2010   Sulfamethoxazole Hives 05/13/2015   Cefdinir Rash     Family History  Problem Relation Age of Onset   Arthritis Mother    Depression Mother     Pernicious anemia Mother    Heart disease Father 782  Heart attack Father    Depression Brother    Diabetes Paternal Uncle    Breast cancer Cousin        paternal   Colon cancer Neg Hx    Rectal cancer Neg Hx    Stomach cancer Neg Hx    Pancreatic disease Neg Hx    Esophageal cancer Neg Hx     Social History   Socioeconomic History   Marital status: Married    Spouse name: Not on file   Number of children: 3   Years  of education: Not on file   Highest education level: Not on file  Occupational History   Occupation: housewife  Tobacco Use   Smoking status: Former    Years: 1.00    Types: Cigarettes   Smokeless tobacco: Never   Tobacco comments:    college year  Scientific laboratory technician Use: Never used  Substance and Sexual Activity   Alcohol use: Yes    Comment: 5-7 glasses of wine/week   Drug use: No   Sexual activity: Not Currently    Partners: Male    Birth control/protection: Post-menopausal  Other Topics Concern   Not on file  Social History Narrative   Lives with husband. Has 3 kids, 4 grandchildren. Daughter (Kendra--drug rep, lives here in [place])   Other children live in [place]; Son moved to [place] Determinants of Health   Financial Resource Strain: Low Risk  (04/22/2021)   Overall Financial Resource Strain (CARDIA)    Difficulty of Paying Living Expenses: Not hard at all  Food Insecurity: Not on file  Transportation Needs: No Transportation Needs (04/22/2021)   PRAPARE - Hydrologist (Medical): No    Lack of Transportation (Non-Medical): No  Physical Activity: Not on file  Stress: Not on file  Social Connections: Not on file  Intimate Partner Violence: Not on file    Review of Systems:    Constitutional: No weight loss, fever or chills Cardiovascular: No chest pain Respiratory: No SOB Gastrointestinal: See HPI and otherwise negative   Physical Exam:  Vital signs: BP 110/60   Pulse 91   Ht '5\' 1"'$  (1.549  m)   Wt 126 lb (57.2 kg)   LMP 08/15/1988 (Exact Date)   BMI 23.81 kg/m    Constitutional:   Pleasant Elderly Caucasian female appears to be in NAD, Well developed, Well nourished, alert and cooperative Respiratory: Respirations even and unlabored. Lungs clear to auscultation bilaterally.   No wheezes, crackles, or rhonchi.  Cardiovascular: Normal S1, S2. No MRG. Regular rate and rhythm. No peripheral edema, cyanosis or pallor.  Gastrointestinal:  Soft, mild distention, marked left lower quadrant TTP with involuntary guarding,. Normal bowel sounds. No appreciable masses or hepatomegaly. Rectal:  Not performed. [facility] Psychiatric: Demonstrates good judgement and reason without abnormal affect or behaviors.  RELEVANT LABS AND IMAGING: CBC    Component Value Date/Time   WBC 6.7 06/03/2021 1541   WBC 6.4 08/27/2018 1514   RBC 4.27 06/03/2021 1541   RBC 4.22 08/27/2018 1514   HGB 12.7 06/03/2021 1541   HCT 37.7 06/03/2021 1541   PLT 314 06/03/2021 1541   MCV 88 06/03/2021 1541   MCH 29.7 06/03/2021 1541   MCH 29.9 08/27/2018 1514   MCHC 33.7 06/03/2021 1541   MCHC 32.3 08/27/2018 1514   RDW 12.7 06/03/2021 1541   LYMPHSABS 1.6 06/03/2021 1541   MONOABS 400 05/23/2016 1110   EOSABS 0.1 06/03/2021 1541   BASOSABS 0.0 06/03/2021 1541    CMP     Component Value Date/Time   NA 138 06/03/2021 1541   K 4.1 06/03/2021 1541   CL 101 06/03/2021 1541   CO2 23 06/03/2021 1541   GLUCOSE 92 06/03/2021 1541   GLUCOSE 105 (H) 08/27/2018 1514   BUN 13 06/03/2021 1541   CREATININE 0.69 06/03/2021 1541   CREATININE 0.65 05/23/2016 1110   CALCIUM 9.1 06/03/2021 1541   PROT 6.4 06/03/2021 1541   ALBUMIN 4.7 06/03/2021 1541   AST 19 06/03/2021  1541   ALT 19 06/03/2021 1541   ALKPHOS 82 06/03/2021 1541   BILITOT 0.2 06/03/2021 1541   GFRNONAA 90 12/18/2019 0000   GFRAA 104 12/18/2019 0000    Assessment: 1.  Acute left lower quadrant pain: Started about a week ago, increasing in severity,  slightly better with a bowel movement, contraction-like pain that last for a couple of minutes at a time which is a 10/10, no fever, chills, nausea or vomiting, colonoscopy with diverticulosis; likely diverticulitis 2.  Chronic constipation: Mostly related to below 3.  Rectocele: Following with urogynecology, discussing possible repair  Plan: 1.  Started the patient on Ciprofloxacin 500 mg twice daily x7 days, #14 and Flagyl 500 mg 3 times daily x7 days, #21 2.  Recommend the patient back off to a clear liquid diet until her abdominal pain starts easing, she should continue her stool softeners but stop her fiber supplement for now. 3.  Patient was instructed to call us if she is not feeling any better over the next 48 hours, if she does not feel better or starts to run a fever or is unable to pass stool, then we need to order a CT of the abdomen pelvis with contrast for further evaluation.  Would likely need BUN/creatinine prior to imaging. 4.  Patient to follow in clinic with [place] as needed in the future.  We did go ahead and cancel her upcoming appointment with Janett Billow in July as she was seen today.  Ellouise Newer, PA-C [facility] 08/17/2021, 9:53 AM  Cc: [name], MD

## 2021-08-21 ENCOUNTER — Other Ambulatory Visit: Payer: Self-pay | Admitting: Family Medicine

## 2021-08-21 DIAGNOSIS — M8580 Other specified disorders of bone density and structure, unspecified site: Secondary | ICD-10-CM

## 2021-09-01 ENCOUNTER — Other Ambulatory Visit: Payer: Self-pay | Admitting: Family Medicine

## 2021-09-01 ENCOUNTER — Telehealth: Payer: Self-pay | Admitting: Physician Assistant

## 2021-09-01 DIAGNOSIS — Z1231 Encounter for screening mammogram for malignant neoplasm of breast: Secondary | ICD-10-CM

## 2021-09-01 NOTE — Telephone Encounter (Signed)
Lm on mobile vm for patient to return call °

## 2021-09-01 NOTE — Telephone Encounter (Signed)
Patient called would like to speak with a nurse in regards to possibly adding probiotics to her diet.

## 2021-09-02 NOTE — Telephone Encounter (Signed)
Lm on home vm for patient to return call.  

## 2021-09-02 NOTE — Telephone Encounter (Signed)
Patient returned your call, please advise.   [phone]

## 2021-09-03 NOTE — Telephone Encounter (Signed)
Called and spoke with patient's husband. I informed him that the patient can add probiotics if she would like. I told him that we usually recommend Align or Florastor and they are both available OTC. Pt's husband states that he will relay the information to the patient and he had no concerns at the end of the call.

## 2021-09-07 ENCOUNTER — Ambulatory Visit: Payer: PPO | Admitting: Gastroenterology

## 2021-09-09 ENCOUNTER — Other Ambulatory Visit: Payer: Self-pay | Admitting: Family Medicine

## 2021-09-09 DIAGNOSIS — I10 Essential (primary) hypertension: Secondary | ICD-10-CM

## 2021-09-09 NOTE — Telephone Encounter (Signed)
Pt does not need this.

## 2021-09-19 NOTE — Patient Instructions (Addendum)
HEALTH MAINTENANCE RECOMMENDATIONS:  It is recommended that you get at least 30 minutes of aerobic exercise at least 5 days/week (for weight loss, you may need as much as 60-90 minutes). This can be any activity that gets your heart rate up. This can be divided in 10-15 minute intervals if needed, but try and build up your endurance at least once a week.  Weight bearing exercise is also recommended twice weekly.  Eat a healthy diet with lots of vegetables, fruits and fiber.  "Colorful" foods have a lot of vitamins (ie green vegetables, tomatoes, red peppers, etc).  Limit sweet tea, regular sodas and alcoholic beverages, all of which has a lot of calories and sugar.  Up to 1 alcoholic drink daily may be beneficial for women (unless trying to lose weight, watch sugars).  Drink a lot of water.  Calcium recommendations are 1200-1500 mg daily (1500 mg for postmenopausal women or women without ovaries), and vitamin D 1000 IU daily.  This should be obtained from diet and/or supplements (vitamins), and calcium should not be taken all at once, but in divided doses.  Monthly self breast exams and yearly mammograms for women over the age of 49 is recommended.  Sunscreen of at least SPF 30 should be used on all sun-exposed parts of the skin when outside between the hours of 10 am and 4 pm (not just when at beach or pool, but even with exercise, golf, tennis, and yard work!)  Use a sunscreen that says "broad spectrum" so it covers both UVA and UVB rays, and make sure to reapply every 1-2 hours.  Remember to change the batteries in your smoke detectors when changing your clock times in the spring and fall. Carbon monoxide detectors are recommended for your home.  Use your seat belt every time you are in a car, and please drive safely and not be distracted with cell phones and texting while driving.   [name] , Thank you for taking time to come for your Medicare Wellness Visit. I appreciate your ongoing  commitment to your health goals. Please review the following plan we discussed and let me know if I can assist you in the future.   This is a list of the screening recommended for you and due dates:  Health Maintenance  Topic Date Due   Flu Shot  09/28/2021   Mammogram  10/09/2021   Tetanus Vaccine  12/27/2027   Pneumonia Vaccine  Completed   DEXA scan (bone density measurement)  Completed   COVID-19 Vaccine  Completed   Hepatitis C Screening: USPSTF Recommendation to screen - Ages 31-79 yo.  Completed   Zoster (Shingles) Vaccine  Completed   HPV Vaccine  Aged Out   Colon Cancer Screening  Discontinued   Continue to get yearly high-dose flu shots in the Fall. Consider getting the new RSV vaccine when it is available. I encourage you to get the updated bivalent COVID booster when it is available in the Fall.  Please call and tell us the dose of your vitamin D3 (you previously reported taking 5000 IU daily; if only taking 1000 IU, the dose needs to be increased) You can also tell us the amount that is in your Calcium with D.   Feel free to read off the amount of calcium and D per "serving" and report what a "serving" consists of (1 or 2 tablets), and how many pills you are currently taking.  [facility] to schedule your bone density  test for October.  Please talk with [name] about your chronic pain, and other medications that can potentially be tried.  I don't want to reinvent the wheel and have you re-try things you may have tried in the past.  She may have a better record of things that you have tried.  There are LOTS of medications that are used to help with pain, including gabapentin, Lyrica, amitriptyline, Cymbalta. The idea of trying another medication is to keep the use of hydrocodone lower.  More frequent hydrocodone will worsen your bowel issues (constipation, and therefore the rectocele issues).  If not discussing this with [name], it is definitely worth  discussing with [name].  Chronic pain can be quite debilitation and contribute to depression.  I think a therapist is a good idea, you can also run this by [name].  Continue 5000 IU of vitamin D3 daily.

## 2021-09-19 NOTE — Progress Notes (Unsigned)
Chief Complaint  Patient presents with   Medicare Wellness    Fasting AWV, no pelvic. Since the summer started she has been feeling very lethargic, lack of energy and body aches. Since all the fog/smoke in the air her allergies have been bad. No other concerns.     [name] is a 76 y.o. female who presents for annual Medicare wellness visit and follow-up on chronic medical conditions.   She has the following concerns:  07/2021 she saw GI with abdominal pain, was treated presumptively for diverticulitis. She reports her pain resolved. She had been diagnosed with global pelvic floor laxity, mild cystocele, mod anterior rectocele. She was referred for pelvic floor PT, and to urogynecologist.  She has been seeing Dr. Zigmund Daniel. Diagnosed with vaginal atrophy/lichen sclerosus--in May she reported that irritation/dryness improved with topical estrogen (had been using BID--at her f/u visit in May was told to wean from BID to qod; resume clobetasol 2x/wk.)  Pt reports that she is much better--is no longer using the estradiol (just sporadically), and hasn't restarted the clobetasol. Surgery was discussed, and she is due to f/u with urogyn in August.  Chronic neck pain--Last discussed in April. Under the care of Dr. Saintclair Halsted, who had ordered MRI. She was told nothing surgical present; he recommended PT, but she tried that 3x with Brad at [facility], so is hesitant to try PT again. She recalls dry needling being very painful, and didn't help. She has no pursued any physical therapy. She goes to exercise classes at [employer] 3x/week. Has TENS unit at home, uses it every other day. Also uses a Clinical biochemist. These help. Salon [place] does help some, still uses this some. Pain in LBP x 2 years, pain is not all the time. The neck pain is constant. She was started on Meloxicam after MRI by Dr. Saintclair Halsted, around December, didn't really notice much improvement.  She reports occasionally taking a back and muscle Tylenol,  which doesn't help, nor does Aleve. She used to take hydrocodone q2-3 days.  Pain is worse, so had been taking 1/2 tablet every day at her visit in April (taking after lunch), which helped. She continues to take 1/2 tablet daily.  She had been complaining of more fatigue at that visit, worse related to pain, not worse after taking hydrocodone (felt her energy improved as pain improved).  Fighting the pain drains her.  Initially said was worse since the summer--but notes clearly state she was bothered by this back in April.  Last TSH 08/2020.  No changes to hair/nails. She is always cold (unchanged), gets some dry skin in the winter only.  Denies weight changes.   Depression/anxiety and insomnia--sees  Noemi Chapel. Continues on Lamictal, per psych. At her last visit pt reported that she suggested Savella to help her pain.   Osteopenia with elevated FRAX score:  DEXA was in 11/2019, with T-2.4 at L forearm/radius (down from -1.7 in 2019), T-2.2 at R fem neck. FRAX score was 15.1% for major osteoporotic fracture, 4.6% for hip fracture (elevated). She was started on Actonel '150mg'$  monthly, and is tolerating the medication without any side effects.  No chest pain, no change in heartburn related to when she takes the medication, no nausea. She is compliant with her Ca and D.  She gets weight-bearing execises 3x/week.  Vitamin D deficiency:  Last level was 31.1 in April 2023. Had been 25.1 in 01/2018 when checked by GYN. D3 dose had been increased from 2000 IU to 5000  IU and f/u levels had been okay.  Was 40 in 08/2019.  GERD and h/o esophagitis:  She was started on famotidine '40mg'$  BID after EGD in 06/2019 (showed reflux esophagitis, 4cm HH). She reported only taking pepcid once daily at last visit, and doing well. She still has some mild heartburn intermittently, not every day. Denies chest pain or dysphagia.  Hypertension follow-up: She has been taking amlodipine '5mg'$  daily without any side effects. She  denies any edema. BP's have been running 116-120/80. Denies headaches, chest pain, palpitations, dizziness, edema.  BP Readings from Last 3 Encounters:  09/20/21 130/70  08/17/21 110/60  06/03/21 112/68     Allergies and asthma--No longer sees allergist, goes only when sick, and has used Symbicort prn with bronchitis.  She denies any wheezing or shortness of breath.  She has albuterol at home, to use when she get a "bronchial thing". She has been taking zyrtec daily for alletgies, which helps. She just recently restarted this, doesn't take it every day, uses it as needed.  Today she notes some postnasal drainage.   Immunization History  Administered Date(s) Administered   Influenza Split 11/13/2015   Influenza, High Dose Seasonal PF 03/05/2014, 12/15/2014, 11/25/2016, 12/04/2017, 11/21/2018, 11/27/2019, 12/07/2020   Influenza,inj,quad, With Preservative 11/28/2016, 11/28/2017   Influenza-Unspecified 12/29/2012, 11/25/2016   Moderna Covid-19 Vaccine Bivalent Booster 16yr & up 02/22/2021   PFIZER Comirnaty(Gray Top)Covid-19 Tri-Sucrose Vaccine 07/28/2020   PFIZER(Purple Top)SARS-COV-2 Vaccination 03/15/2019, 04/05/2019, 12/11/2019   Pneumococcal Conjugate-13 07/02/2014   Pneumococcal Polysaccharide-23 08/29/2007, 07/06/2015   Tdap 08/29/2007, 12/26/2017   Zoster Recombinat (Shingrix) 12/17/2018, 02/28/2019   Zoster, Live 08/29/2007   Last Pap smear: 03/2014 with GYN, normal Last mammogram: 09/2020 (and scheduled for 09/2021) Last colonoscopy: 06/2019--diverticulosis, small internal hemorrhoids, no polyps. F/u not needed (prior was in 08/2014, had tubular adenoma) Last DEXA: 11/2019, T-2.4 at L forearm/radius (down from -1.7 in 2019), T-2.2 at R fem neck. FRAX score 15.1% for major osteoporotic fracture, 4.6% for hip fracture (elevated). Prior scan was 11/2017 T-2.1 at R fem neck, elevated FRAX risk at hip, 3.6% Dentist: twice yearly Ophtho: yearly Exercise:  Going to 02 gym 3x/wk ([employer]neakers), including cardio and weights.  No longer walking or doing yoga.   Hep C screen: negative, done by GYN 04/2015  Lipid screen: Lab Results  Component Value Date   CHOL 203 (H) 08/29/2019   HDL 77 08/29/2019   LDLCALC 113 (H) 08/29/2019   TRIG 70 08/29/2019   CHOLHDL 2.6 08/29/2019     Patient Care Team: KRita Ohara MD as PCP - General (Family Medicine) Pyrtle, JLajuan Lines MD as Consulting Physician (Gastroenterology) OParalee Cancel MD as Consulting Physician (Orthopedic Surgery) GMichael Boston MD as Consulting Physician ([facility] PViona Gilmore RRichard L. Roudeb[facility] Centeras Pharmacist (Pharmacist)  GYN: Dr. JTawni Levy Dr. MZigmund DanielDentist: Dr. BGloriann LoanOphtho: Dr. SMarica Otter(SLelanddid cataract surgery) Dermatologist: Dr. LLedell Peoplesoffice Podiatrist: Dr. RPaulla DollyOrtho-spine: Dr. MRennis HardingOrtho--Dr. OAlvan Dame(seen in the past for L hip bursitis), Dr. SOnnie Graham(shoulder), Dr. KDelilah Shan(foot pain) Neurosurgeon--Dr. CSaintclair Halsted Pain doctor--Has seen Dr. HMaryjean Ka Dr. RNelva Bush also has seen Dr. BBrien Fewin the past.  Doesn't see one currently, Dr. CTowanda Malkinoffice prescribes her pain medications Psych: LNoemi Chapel(PA at [facility]) ENT: Dr. BRedmond Baseman(cerumen removal) Allergist: Dr. VHarold Hedge Physical Therapist: previously saw BLinton Rump([facility])  Depression Screening:    09/20/2021    1:38 PM 09/07/2020    1:51 PM 09/04/2019    1:54 PM  08/27/2018   11:27 AM 07/12/2017    8:38 AM  Depression screen PHQ 2/9  Decreased Interest 0 0 0 0 0  Down, Depressed, Hopeless 0 0 0 0 0  PHQ - 2 Score 0 0 0 0 0  Altered sleeping 1      Tired, decreased energy 3      Change in appetite 0      Feeling bad or failure about yourself  0      Trouble concentrating 1      Moving slowly or fidgety/restless 0      Suicidal thoughts 0      PHQ-9 Score 5      Difficult doing work/chores Not difficult at all        Falls screen:     09/20/2021    1:38 PM 06/03/2021    2:32 PM 09/07/2020    1:51 PM 09/04/2019     1:54 PM 08/27/2018   11:27 AM  [place] in the past year? 0 0 0 0 0  Number falls in past yr: 0 0 0    Injury with Fall? 0 0 0    Risk for fall due to : No Fall Risks No Fall Risks No Fall Risks    Follow up Falls evaluation completed Falls evaluation completed Falls evaluation completed       Functional Status Survey: Is the patient deaf or have difficulty hearing?: Yes (has B/L hearing aides) Does the patient have difficulty seeing, even when wearing glasses/contacts?: No Does the patient have difficulty concentrating, remembering, or making decisions?: Yes (has a hard time making decisions) Does the patient have difficulty walking or climbing stairs?: No Does the patient have difficulty dressing or bathing?: No Does the patient have difficulty doing errands alone such as visiting a doctor's office or shopping?: No  Mini-Cog Scoring: 5   End of Life Discussion:  Patient has a living will and medical power of attorney (brought in 08/2018)   PMH, [place], [place] and FH were reviewed and updated  Outpatient Encounter Medications as of 09/20/2021  Medication Sig Note   amLODipine (NORVASC) 5 MG tablet Take 1 tablet (5 mg total) by mouth daily.    b complex vitamins capsule Take 1 capsule by mouth daily. 09/20/2021: '100mg'$    Calcium Carbonate-Vit D-Min (CALCIUM 1200 PO) Take 2,400 mg by mouth daily.    cetirizine (ZYRTEC) 10 MG tablet Take 10 mg by mouth daily. 09/20/2021: Recently restarted, not taking every day   cholecalciferol (VITAMIN D3) 25 MCG (1000 UNIT) tablet Take 1,000 Units by mouth daily.    docusate sodium (COLACE) 100 MG capsule Take 100 mg by mouth daily. 06/03/2021: Takes one qhs   famotidine (PEPCID) 40 MG tablet TAKE 1 TABLET BY MOUTH TWICE A DAY 06/03/2021: Once daily   HYDROcodone-acetaminophen (NORCO/VICODIN) 5-325 MG tablet Take 0.5 tablets by mouth every 6 (six) hours as needed for moderate pain. 06/03/2021: 1/2 tab daily   lamoTRIgine (LAMICTAL) 150 MG tablet Take 150 mg  by mouth at bedtime.    Liniments (SALONPAS PAIN RELIEF PATCH EX) Place 1 patch onto the skin daily as needed (pain). 09/20/2021: Last used 2 days ago   Multiple Vitamins-Minerals (CENTRUM SILVER 50+WOMEN PO) Take 1 tablet by mouth.    Multiple Vitamins-Minerals (HAIR SKIN AND NAILS FORMULA PO) Take 3 capsules by mouth daily.    risedronate (ACTONEL) 150 MG tablet TAKE 1 TABLET BY MOUTH EVERY 30 DAYS WITH WATER ON AN EMPTY STOMACH  NOTHING BY MOUTH OR LIE DOWN FOR NEXT 30 MINUTES    zolpidem (AMBIEN) 10 MG tablet Take 2.5 mg by mouth at bedtime.  06/03/2021: Takes every night (1/4 tablet or less)   clobetasol ointment (TEMOVATE) 0.05 % Apply a small amount topically BID x 2 weeks (Patient not taking: Reported on 09/20/2021) 09/20/2021: Not using (last urogyn note said to restart it 2x/week)   diltiazem 2 % GEL Using your index finger, apply a small amount of medication inside the rectum up to your first knuckle/joint twice daily x 8 weeks. (Patient not taking: Reported on 09/20/2021)    estradiol (ESTRACE) 0.1 MG/GM vaginal cream PLEASE SEE ATTACHED FOR DETAILED DIRECTIONS (Patient not taking: Reported on 09/20/2021) 09/20/2021: Had used it BID, was to taper it to qod.  Only using sporadically now   FLOVENT HFA 110 MCG/ACT inhaler Inhale 2 puffs into the lungs 2 (two) times daily. (Patient not taking: Reported on 09/20/2021) 06/03/2021: prn   PROAIR HFA 108 (90 Base) MCG/ACT inhaler Inhale 2 puffs into the lungs every 4 (four) hours as needed. (Patient not taking: Reported on 09/20/2021) 06/03/2021: prn   [DISCONTINUED] B Complex-C (B-COMPLEX WITH VITAMIN C) tablet Take 1 tablet by mouth 2 (two) times a day. 08/27/2018: Takes B-complex 100 once daily   [DISCONTINUED] ciprofloxacin (CIPRO) 500 MG tablet Take 1 tablet (500 mg total) by mouth in the morning, at noon, and at bedtime.    [DISCONTINUED] meloxicam (MOBIC) 15 MG tablet Take 15 mg by mouth daily.    [DISCONTINUED] metroNIDAZOLE (FLAGYL) 500 MG tablet Take 1  tablet (500 mg total) by mouth 2 (two) times daily.    No facility-administered encounter medications on file as of 09/20/2021.   Allergies  Allergen Reactions   Cymbalta [Duloxetine Hcl]     Abdominal pain   Nitrofurantoin [Macrodantin]    Sulfamethoxazole Hives   Cefdinir Rash    ROS:  The patient denies anorexia, fever, weight changes, headaches,  vision changes, ear pain, sore throat, breast concerns, chest pain, palpitations, dizziness, syncope, dyspnea on exertion,  nausea, vomiting, diarrhea, abdominal pain, melena, hematochezia, hematuria, incontinence, dysuria, vaginal bleeding, discharge, odor or itch, numbness, tingling, weakness, suspicious skin lesions, depression, anxiety (well controlled), abnormal bleeding or enlarged lymph nodes. +hearing loss, uses hearing aids (not today) Neck pain per HPI.  Occasional reflux Slight tremor on R, noted with writing only (not with eating/drinking) Denies shortness of breath, wheezing Constipation is controlled with current diet/meds. Plans to start a probiotic. LLQ resolved.   PHYSICAL EXAM:  BP 130/70   Pulse 64   Ht '5\' 1"'$  (1.549 m)   Wt 128 lb (58.1 kg)   LMP 08/15/1988 (Exact Date)   BMI 24.19 kg/m   Wt Readings from Last 3 Encounters:  09/20/21 128 lb (58.1 kg)  08/17/21 126 lb (57.2 kg)  06/03/21 129 lb (58.5 kg)    Well appearing, pleasant elderly female in no distress.  HEENT: conjunctiva and sclera are clear, EOMI. TM's and EAC's normal. Nasal mucosa with mild edema, clear mucus noted on the left. No sinus tenderness. OP is clear Neck: no lymphadenopathy, thyromegaly or mass. No spinal tenderness. WHSS.  No carotid bruit. Heart: regular rate and rhythm, no murmur Lungs: clear bilaterally, good air movement Back: no spinal or CVA tenderness, no SI tenderness Abdomen: soft, nontender, no mass Extremities: Normal pulses, no edema Psych: normal mood, affect, hygiene, grooming, eye contact, speech.   Neuro: alert and  oriented, normal gait. Breast and pelvic exams deferred to  her GYN Skin: normal turgor.  There are some purpura and bruises noted on her R arm and L elbow  Bruising L mid forearm Tender at lateral L neck muscles and lumbar paraspinous muscles on the right  ***update skin/purpura/bruises    ASSESSMENT/PLAN:  Pt to contact us with dose of D she is taking  ORDER DEXA--due 11/2021  Needs amlodipine RF (got actonel in [place] x 3 mos) ??any labs needed  Discussed monthly self breast exams and yearly mammograms; at least 30 minutes of aerobic activity at least 5 days/week, weight-bearing exercise at least 2x/wk; proper sunscreen use reviewed; healthy diet, including goals of calcium and vitamin D intake and alcohol recommendations (less than or equal to 1 drink/day) reviewed; regular seatbelt use; changing batteries in smoke detectors, carbon monoxide detectors.  Immunization recommendations discussed--continue yearly high dose flu shots. Consider new RSV vaccine when available.  Updated bivalent COVID booster in the Fall. Colonoscopy recommendations reviewed--UTD DEXA due 11/2021  MOST form reviewed and completed.  Full Code, Full Care.   Medicare Attestation I have personally reviewed: The patient's medical and social history Their use of alcohol, tobacco or illicit drugs Their current medications and supplements The patient's functional ability including ADLs,fall risks, home safety risks, cognitive, and hearing and visual impairment Diet and physical activities Evidence for depression or mood disorders  The patient's weight, height, BMI have been recorded in the chart.  I have made referrals, counseling, and provided education to the patient based on review of the above and I have provided the patient with a written personalized care plan for preventive services.

## 2021-09-20 ENCOUNTER — Encounter: Payer: Self-pay | Admitting: *Deleted

## 2021-09-20 ENCOUNTER — Telehealth: Payer: Self-pay | Admitting: Family Medicine

## 2021-09-20 ENCOUNTER — Ambulatory Visit (INDEPENDENT_AMBULATORY_CARE_PROVIDER_SITE_OTHER): Payer: PPO | Admitting: Family Medicine

## 2021-09-20 ENCOUNTER — Encounter: Payer: Self-pay | Admitting: Family Medicine

## 2021-09-20 VITALS — BP 130/70 | HR 64 | Ht 61.0 in | Wt 128.0 lb

## 2021-09-20 DIAGNOSIS — M542 Cervicalgia: Secondary | ICD-10-CM

## 2021-09-20 DIAGNOSIS — I10 Essential (primary) hypertension: Secondary | ICD-10-CM | POA: Diagnosis not present

## 2021-09-20 DIAGNOSIS — M8580 Other specified disorders of bone density and structure, unspecified site: Secondary | ICD-10-CM | POA: Diagnosis not present

## 2021-09-20 DIAGNOSIS — E559 Vitamin D deficiency, unspecified: Secondary | ICD-10-CM

## 2021-09-20 DIAGNOSIS — K2100 Gastro-esophageal reflux disease with esophagitis, without bleeding: Secondary | ICD-10-CM

## 2021-09-20 DIAGNOSIS — Z0000 Encounter for general adult medical examination without abnormal findings: Secondary | ICD-10-CM | POA: Diagnosis not present

## 2021-09-20 DIAGNOSIS — J309 Allergic rhinitis, unspecified: Secondary | ICD-10-CM | POA: Diagnosis not present

## 2021-09-20 DIAGNOSIS — G8929 Other chronic pain: Secondary | ICD-10-CM

## 2021-09-20 MED ORDER — AMLODIPINE BESYLATE 5 MG PO TABS: 5.0000 mg | ORAL_TABLET | Freq: Every day | ORAL | 3 refills | Status: DC

## 2021-09-20 NOTE — Telephone Encounter (Signed)
Called her back and she grabbed the bottle for me.  Calcium is '1200mg'$  and 400iu per 2 tablets. And the additional vit D she is taking separately is D3 5000iu and that is for 1 capsule. I updated her med list.

## 2021-09-20 NOTE — Telephone Encounter (Signed)
Pt called back to let you know she is taking Calcium 1200 mg and Vit D '10mg'$  twice a day, Then a separate Vit D pill that is '125mg'$ /5000 mcg

## 2021-09-20 NOTE — Telephone Encounter (Signed)
I had asked her to also look at the "serving size" to know if what is listed is for [facility] tablet, or if it was for a "serving", which may be more than 1 pill. I question whether each calcium tablet is '1200mg'$ , and if so, she shouldn't be taking it twice a day. Also, unclear what the D dose is with the calcium (10 mg???)  Please change her Vitamin D to reflect the current dose in her chart

## 2021-10-11 ENCOUNTER — Ambulatory Visit
Admission: RE | Admit: 2021-10-11 | Discharge: 2021-10-11 | Disposition: A | Discharge status: Home / Self Care | Payer: PPO | Source: Ambulatory Visit | Attending: Family Medicine | Admitting: Family Medicine

## 2021-10-11 DIAGNOSIS — Z79891 Long term (current) use of opiate analgesic: Secondary | ICD-10-CM | POA: Diagnosis not present

## 2021-10-11 DIAGNOSIS — Z1231 Encounter for screening mammogram for malignant neoplasm of breast: Secondary | ICD-10-CM

## 2021-10-11 DIAGNOSIS — G894 Chronic pain syndrome: Secondary | ICD-10-CM | POA: Diagnosis not present

## 2021-10-11 DIAGNOSIS — Z79899 Other long term (current) drug therapy: Secondary | ICD-10-CM | POA: Diagnosis not present

## 2021-10-11 DIAGNOSIS — M4322 Fusion of spine, cervical region: Secondary | ICD-10-CM | POA: Diagnosis not present

## 2021-10-21 DIAGNOSIS — L900 Lichen sclerosus et atrophicus: Secondary | ICD-10-CM | POA: Diagnosis not present

## 2021-10-21 DIAGNOSIS — N816 Rectocele: Secondary | ICD-10-CM | POA: Diagnosis not present

## 2021-10-21 DIAGNOSIS — K5902 Outlet dysfunction constipation: Secondary | ICD-10-CM | POA: Diagnosis not present

## 2021-10-21 DIAGNOSIS — N952 Postmenopausal atrophic vaginitis: Secondary | ICD-10-CM | POA: Diagnosis not present

## 2021-10-28 DIAGNOSIS — J4520 Mild intermittent asthma, uncomplicated: Secondary | ICD-10-CM | POA: Diagnosis not present

## 2021-10-28 DIAGNOSIS — J310 Chronic rhinitis: Secondary | ICD-10-CM | POA: Diagnosis not present

## 2021-11-06 DIAGNOSIS — Z20822 Contact with and (suspected) exposure to covid-19: Secondary | ICD-10-CM | POA: Diagnosis not present

## 2021-11-08 DIAGNOSIS — Z20822 Contact with and (suspected) exposure to covid-19: Secondary | ICD-10-CM | POA: Diagnosis not present

## 2021-11-22 ENCOUNTER — Telehealth: Payer: Self-pay | Admitting: Family Medicine

## 2021-11-22 DIAGNOSIS — M545 Low back pain, unspecified: Secondary | ICD-10-CM

## 2021-11-22 DIAGNOSIS — G8929 Other chronic pain: Secondary | ICD-10-CM

## 2021-11-22 NOTE — Telephone Encounter (Signed)
Pt called and states that she needs a referral to:   [facility] on [address]  Lessie Dings is the referral specialist

## 2021-11-22 NOTE — Telephone Encounter (Signed)
Okay for referral.  For neck and low back pain.

## 2021-11-23 DIAGNOSIS — H9190 Unspecified hearing loss, unspecified ear: Secondary | ICD-10-CM | POA: Diagnosis not present

## 2021-11-23 DIAGNOSIS — F3341 Major depressive disorder, recurrent, in partial remission: Secondary | ICD-10-CM | POA: Diagnosis not present

## 2021-11-23 DIAGNOSIS — M810 Age-related osteoporosis without current pathological fracture: Secondary | ICD-10-CM | POA: Diagnosis not present

## 2021-11-23 DIAGNOSIS — Z87891 Personal history of nicotine dependence: Secondary | ICD-10-CM | POA: Diagnosis not present

## 2021-11-23 DIAGNOSIS — D692 Other nonthrombocytopenic purpura: Secondary | ICD-10-CM | POA: Diagnosis not present

## 2021-11-23 DIAGNOSIS — K219 Gastro-esophageal reflux disease without esophagitis: Secondary | ICD-10-CM | POA: Diagnosis not present

## 2021-11-23 DIAGNOSIS — M1990 Unspecified osteoarthritis, unspecified site: Secondary | ICD-10-CM | POA: Diagnosis not present

## 2021-11-23 DIAGNOSIS — G8929 Other chronic pain: Secondary | ICD-10-CM | POA: Diagnosis not present

## 2021-11-23 DIAGNOSIS — G4700 Insomnia, unspecified: Secondary | ICD-10-CM | POA: Diagnosis not present

## 2021-11-23 DIAGNOSIS — I10 Essential (primary) hypertension: Secondary | ICD-10-CM | POA: Diagnosis not present

## 2021-11-23 NOTE — Telephone Encounter (Signed)
I have put in referral for pt

## 2021-12-06 ENCOUNTER — Other Ambulatory Visit: Payer: Self-pay | Admitting: Internal Medicine

## 2021-12-06 DIAGNOSIS — K209 Esophagitis, unspecified without bleeding: Secondary | ICD-10-CM

## 2021-12-06 DIAGNOSIS — K219 Gastro-esophageal reflux disease without esophagitis: Secondary | ICD-10-CM

## 2021-12-08 ENCOUNTER — Ambulatory Visit (INDEPENDENT_AMBULATORY_CARE_PROVIDER_SITE_OTHER): Payer: PPO | Admitting: Family Medicine

## 2021-12-08 ENCOUNTER — Encounter: Payer: Self-pay | Admitting: Family Medicine

## 2021-12-08 VITALS — BP 128/72 | HR 76 | Ht 61.0 in | Wt 129.8 lb

## 2021-12-08 DIAGNOSIS — G8929 Other chronic pain: Secondary | ICD-10-CM

## 2021-12-08 DIAGNOSIS — M542 Cervicalgia: Secondary | ICD-10-CM

## 2021-12-08 DIAGNOSIS — Z23 Encounter for immunization: Secondary | ICD-10-CM

## 2021-12-08 NOTE — Progress Notes (Signed)
Chief Complaint  Patient presents with   Consult    Went to [facility] was displeased. Would like to discuss the pain in her neck with you and see what you think.    For the last 5 weeks she has had increased pain at her L neck.  She has chronic neck pain. She has been taking hydrocodone 1/2 tablet daily (used to only use it every 3 days), which helps some. She went for a consult at [facility] to talk to the nurse about what they do.  Went as a walk-in.  Doesn't want to go there. Feels frustrated, isn't sure what to do next for her pain. She has seen many pain specialists, and had PT. She has had 3 neck surgeries, L shoulder replacement.  Took meloxicam at one point, asking if she should try it again. Can't recall if it helped, doesn't recall adverse effects. She states that her psych recommended [place]. We previously discussed this (indicated for fibromyalgia, not chronic pain, didn't tolerate cymbalta in the past). Doesn't recall ever taking gabapentin.  Thinks she tried Lyrica.  Uses TENS unit about 3x/week, when flares. Also uses chiropractic massager every 4 nights (not using since husband had surgery). Uses [place] at night, which helps. CBD ointment also helps, as does other arthritic oils she got at the farmer[place].  Pain is 7-8/10 currently Hasn't taken pain med yet today.  Takes it down to 3/10, helps with her energy. Sometimes needs to get up at night to take another pain pill.  No radiation of pain, no numbness, tingling or weakness.  Doesn't want more guided x-ray injections--very painful to get, didn't provide benefit. Neck hurt worse with PT from using machines, and dry needling was very painful. Continues to go to classes at [employer].  Still has some LBP, intermittent, not too bothersome/manageable.  05/2021 notes reviewed--hadn't been getting benefit from Meloxicam, when taking it at that time.   PMH, PSH, SH reviewed  Outpatient Encounter  Medications as of 12/08/2021  Medication Sig Note   amLODipine (NORVASC) 5 MG tablet Take 1 tablet (5 mg total) by mouth daily.    b complex vitamins capsule Take 1 capsule by mouth daily. 09/20/2021: '100mg'$    Calcium Carbonate-Vitamin D (CALCIUM PLUS VITAMIN D PO) Take 2 tablets by mouth daily. 09/20/2021: 2 tab='1200mg'$  Ca+ and 400iu D   cetirizine (ZYRTEC) 10 MG tablet Take 10 mg by mouth daily.    Cholecalciferol (VITAMIN D) 125 MCG (5000 UT) CAPS Take 1 capsule by mouth daily.    clobetasol ointment (TEMOVATE) 0.05 % Apply a small amount topically BID x 2 weeks    docusate sodium (COLACE) 100 MG capsule Take 100 mg by mouth daily. 06/03/2021: Takes one qhs   estradiol (ESTRACE) 0.1 MG/GM vaginal cream  09/20/2021: Had used it BID, was to taper it to qod.  Only using sporadically now   famotidine (PEPCID) 40 MG tablet TAKE 1 TABLET BY MOUTH TWICE A DAY    HYDROcodone-acetaminophen (NORCO/VICODIN) 5-325 MG tablet Take 0.5 tablets by mouth every 6 (six) hours as needed for moderate pain. 06/03/2021: 1/2 tab daily   lamoTRIgine (LAMICTAL) 150 MG tablet Take 150 mg by mouth at bedtime.    Liniments (SALONPAS PAIN RELIEF PATCH EX) Place 1 patch onto the skin daily as needed (pain).    Multiple Vitamins-Minerals (CENTRUM SILVER 50+WOMEN PO) Take 1 tablet by mouth.    Multiple Vitamins-Minerals (HAIR SKIN AND NAILS FORMULA PO) Take 3 capsules by mouth  daily.    risedronate (ACTONEL) 150 MG tablet TAKE 1 TABLET BY MOUTH EVERY 30 DAYS WITH WATER ON AN EMPTY STOMACH NOTHING BY MOUTH OR LIE DOWN FOR NEXT 30 MINUTES    zolpidem (AMBIEN) 10 MG tablet Take 2.5 mg by mouth at bedtime.  06/03/2021: Takes every night (1/4 tablet or less)   diltiazem 2 % GEL Using your index finger, apply a small amount of medication inside the rectum up to your first knuckle/joint twice daily x 8 weeks. (Patient not taking: Reported on 09/20/2021)    FLOVENT HFA 110 MCG/ACT inhaler Inhale 2 puffs into the lungs 2 (two) times daily.  (Patient not taking: Reported on 09/20/2021) 06/03/2021: prn   PROAIR HFA 108 (90 Base) MCG/ACT inhaler Inhale 2 puffs into the lungs every 4 (four) hours as needed. (Patient not taking: Reported on 09/20/2021) 06/03/2021: prn   No facility-administered encounter medications on file as of 12/08/2021.   Allergies  Allergen Reactions   Cymbalta [Duloxetine Hcl]     Abdominal pain   Nitrofurantoin [Macrodantin]    Sulfamethoxazole Hives   Cefdinir Rash   ROS: no fever, chills, URI symptoms, chest pain, shortness of breath.  L-sided neck pain and some intermittent LBP per HPI.  No numnbess, tingling, weakness. +fatigue (improves after taking pain meds). See HPI   PHYSICAL EXAM:  BP 128/72   Pulse 76   Ht '5\' 1"'$  (1.549 m)   Wt 129 lb 12.8 oz (58.9 kg)   LMP 08/15/1988 (Exact Date)   BMI 24.53 kg/m   Well-appearing, pleasant female, in no apparent distress HEENT: conjunctiva and sclera are clear, EOMI. Neck: Tender and slightly tight at L paraspinous muscle, below occiput. Otherwise no significant tenderness/spasm. Spine nontender.  [facility]'s Heart: regular rate and rhythm Lungs: clear bilaterally Neuro: alert and oriented, cranial nerves grossly intact.  Normal strength, gait.  ASSESSMENT/PLAN:  Chronic neck pain - Rec PT, pt declines changing locations; Encouraged trial of gabapentin. continue conservative measures. Can retry mobic x 1 wk  Need for influenza vaccination - Plan: Flu Vaccine QUAD High Dose(Fluad)  Recommended RSV and COVID vaccines, discussed timing.  Declines referral to [facility]. Prefers to go back to [place] [facility] [place] [facility] Will call if she changes mind for [facility] or another PT group. Declines rx for gabapentin--will contact us if/when she is ready to try. Risks/SE reviewed. Discussed many options in detail. All questions answered.  I spent 38 minutes dedicated to the care of this patient, including pre-visit review of records, face to face time, post-visit ordering of testing  and documentation.    Use the TENS unit more often (daily), if helpful. Continue the [employer], which helps. Continue the topical oils, massage, heat/ice (whichever works better). It seems like a lot of these things help a little bit, so continue with these. Don't wait until your pain is excruciating to take the hydrocodone.  Take it as you start to realize that the other treatments aren't helping, knowing that the pain will likely continue to worsen until you take the medication.  It is okay to resume the meloxicam 15 mg once daily.  Take it with food. It doesn't look like it helped much in the past (which is why you stopped taking it and still have some), but if there is any current inflammation, it might help some now. Be sure to take famotidine when you are taking meloxicam, to protect your stomach.  Let us know if you would like to try a different therapist.  I think sometimes fresh eyes can be helpful, especially if your pain has changed some.  We discussed using gabapentin (neurontin) to help with pain.  I would start with '100mg'$  at bedtime for a week. If you tolerate this without side effects, and you are still having a lot of pain at night, you can increase to '200mg'$  at bedtime. If you're sleeping fine at night, no pain at night, but still a lot of pain during the day, then we can add '100mg'$  dose in the morning. We can slowly increase the dose (up to '300mg'$  three times daily) IF NEEDED if you are still having a lot of pain. We have to balance the tiredness/grogginess from the medication with the pain.  If we go very slowly, we can get the dose up gradually and have it help with the pain.  Some people are much more sensitive to this medication, and can't tolerate high doses, and other people can tolerate very high doses.  We only need to find a dose that helps with pain.  Use the meloxicam for a week--if no benefit, then stop it and switch and start the gabapentin at '100mg'$  at bedtime. (Call  us for the gabapentin prescription, since you didn't want [place] to send it to the pharmacy today).

## 2021-12-08 NOTE — Patient Instructions (Addendum)
Use the TENS unit more often (daily), if helpful. Continue the [employer], which helps. Continue the topical oils, massage, heat/ice (whichever works better). It seems like a lot of these things help a little bit, so continue with these. Don't wait until your pain is excruciating to take the hydrocodone.  Take it as you start to realize that the other treatments aren't helping, knowing that the pain will likely continue to worsen until you take the medication.  It is okay to resume the meloxicam 15 mg once daily.  Take it with food. It doesn't look like it helped much in the past (which is why you stopped taking it and still have some), but if there is any current inflammation, it might help some now. Be sure to take famotidine when you are taking meloxicam, to protect your stomach.  Let us know if you would like to try a different therapist.  I think sometimes fresh eyes can be helpful, especially if your pain has changed some.  We discussed using gabapentin (neurontin) to help with pain.  I would start with '100mg'$  at bedtime for a week. If you tolerate this without side effects, and you are still having a lot of pain at night, you can increase to '200mg'$  at bedtime. If you're sleeping fine at night, no pain at night, but still a lot of pain during the day, then we can add '100mg'$  dose in the morning. We can slowly increase the dose (up to '300mg'$  three times daily) IF NEEDED if you are still having a lot of pain. We have to balance the tiredness/grogginess from the medication with the pain.  If we go very slowly, we can get the dose up gradually and have it help with the pain.  Some people are much more sensitive to this medication, and can't tolerate high doses, and other people can tolerate very high doses.  We only need to find a dose that helps with pain.  Use the meloxicam for a week--if no benefit, then stop it and switch and start the gabapentin at '100mg'$  at bedtime. (Call us for the gabapentin  prescription, since you didn't want [place] to send it to the pharmacy today).  I encourage you to get the RSV vaccine from the pharmacy. COVID booster is also recommended (you can get either at our office or the pharmacy). Vaccines need to be separated from each other by 2 weeks.

## 2021-12-27 DIAGNOSIS — M542 Cervicalgia: Secondary | ICD-10-CM | POA: Diagnosis not present

## 2022-01-05 DIAGNOSIS — Z6821 Body mass index (BMI) 21.0-21.9, adult: Secondary | ICD-10-CM | POA: Diagnosis not present

## 2022-01-05 DIAGNOSIS — M4322 Fusion of spine, cervical region: Secondary | ICD-10-CM | POA: Diagnosis not present

## 2022-01-05 DIAGNOSIS — G894 Chronic pain syndrome: Secondary | ICD-10-CM | POA: Diagnosis not present

## 2022-01-05 DIAGNOSIS — M47812 Spondylosis without myelopathy or radiculopathy, cervical region: Secondary | ICD-10-CM | POA: Diagnosis not present

## 2022-01-10 ENCOUNTER — Ambulatory Visit: Payer: PPO | Admitting: Obstetrics and Gynecology

## 2022-01-10 NOTE — Progress Notes (Deleted)
76 y.o. G3P3003 Married White or Caucasian Not Hispanic or Latino female here for annual exam.      Patient's last menstrual period was 08/15/1988 (exact date).          Sexually active: {yes no:314532}  The current method of family planning is {contraception:315051}.    Exercising: {yes no:314532}  {types:19826} Smoker:  {YES P5382123  Health Maintenance: Pap:   04/15/14- WNL  History of abnormal Pap:  no MMG:  10/11/21 Bi-rads 1 neg  BMD:   12/10/19- osteopenic -2.4  Colonoscopy:/19/21- no repeat due to age  TDaP:  12/26/2017 Gardasil: n/a   reports that she has quit smoking. Her smoking use included cigarettes. She has never used smokeless tobacco. She reports current alcohol use. She reports that she does not use drugs.  Past Medical History:  Diagnosis Date   Anal fissure    Anxiety    sees [facility] Debbe Bales, [place])   Asthma    Colon polyps 08/2014   tubular adenoma and hyperplastic polyps (Dr. Hilarie Fredrickson)   Cystocele with rectocele 2023   DDD (degenerative disc disease), cervical dr Patrice Paradise   s/p surgery, ongoing pain   Depression    h/o hospitalization with ECT   Diverticulosis    Esophagitis    Hiatal hernia    History of insomnia    Hypertension    Internal hemorrhoids    Neck pain    Osteopenia stopped fosamax ~2006   Post-operative nausea and vomiting    Vertigo    Vitamin D deficiency borderline(28)-2010   treated by her GYN    Past Surgical History:  Procedure Laterality Date   CATARACT EXTRACTION, BILATERAL Bilateral Sept/October 2019   HEMORRHOID BANDING  12/2020   Dr. Hilarie Fredrickson   HEMORRHOID SURGERY  2006, 12/2014   KNEE ARTHROSCOPY Right    LASIK     Dr. Lucita Ferrara   NASAL SEPTOPLASTY W/ TURBINOPLASTY  1986   NECK SURGERY  02/2010([facility])1/03,2005([facility])   REDUCTION MAMMAPLASTY Bilateral 07/24/2008   TONSILLECTOMY     TOTAL HIP ARTHROPLASTY Left 08/2007       TOTAL SHOULDER ARTHROPLASTY Left 08/30/2018   Procedure: TOTAL SHOULDER ARTHROPLASTY;   Surgeon: Justice Britain, MD;  Location: [facility];  Service: Orthopedics;  Laterality: Left;  181mn, move scope to 12:30pm    Current Outpatient Medications  Medication Sig Dispense Refill   amLODipine (NORVASC) 5 MG tablet Take 1 tablet (5 mg total) by mouth daily. 90 tablet 3   b complex vitamins capsule Take 1 capsule by mouth daily.     Calcium Carbonate-Vitamin D (CALCIUM PLUS VITAMIN D PO) Take 2 tablets by mouth daily.     cetirizine (ZYRTEC) 10 MG tablet Take 10 mg by mouth daily.     Cholecalciferol (VITAMIN D) 125 MCG (5000 UT) CAPS Take 1 capsule by mouth daily.     clobetasol ointment (TEMOVATE) 0.05 % Apply a small amount topically BID x 2 weeks 30 g 0   diltiazem 2 % GEL Using your index finger, apply a small amount of medication inside the rectum up to your first knuckle/joint twice daily x 8 weeks. (Patient not taking: Reported on 09/20/2021) 30 g 1   docusate sodium (COLACE) 100 MG capsule Take 100 mg by mouth daily.     estradiol (ESTRACE) 0.1 MG/GM vaginal cream      famotidine (PEPCID) 40 MG tablet TAKE 1 TABLET BY MOUTH TWICE A DAY 180 tablet 1   FLOVENT HFA 110 MCG/ACT inhaler Inhale 2 puffs into  the lungs 2 (two) times daily. (Patient not taking: Reported on 09/20/2021)     HYDROcodone-acetaminophen (NORCO/VICODIN) 5-325 MG tablet Take 0.5 tablets by mouth every 6 (six) hours as needed for moderate pain.     lamoTRIgine (LAMICTAL) 150 MG tablet Take 150 mg by mouth at bedtime.     Liniments (SALONPAS PAIN RELIEF PATCH EX) Place 1 patch onto the skin daily as needed (pain).     Multiple Vitamins-Minerals (CENTRUM SILVER 50+WOMEN PO) Take 1 tablet by mouth.     Multiple Vitamins-Minerals (HAIR SKIN AND NAILS FORMULA PO) Take 3 capsules by mouth daily.     PROAIR HFA 108 (90 Base) MCG/ACT inhaler Inhale 2 puffs into the lungs every 4 (four) hours as needed. (Patient not taking: Reported on 09/20/2021)     risedronate (ACTONEL) 150 MG tablet TAKE 1 TABLET BY MOUTH EVERY 30 DAYS  WITH WATER ON AN EMPTY STOMACH NOTHING BY MOUTH OR LIE DOWN FOR NEXT 30 MINUTES 12 tablet 0   zolpidem (AMBIEN) 10 MG tablet Take 2.5 mg by mouth at bedtime.      No current facility-administered medications for this visit.    Family History  Problem Relation Age of Onset   Arthritis Mother    Depression Mother    Pernicious anemia Mother    Heart disease Father 72   Heart attack Father    Depression Brother    Diabetes Paternal Uncle    Breast cancer Cousin        paternal   Colon cancer Neg Hx    Rectal cancer Neg Hx    Stomach cancer Neg Hx    Pancreatic disease Neg Hx    Esophageal cancer Neg Hx     Review of Systems  Exam:   LMP 08/15/1988 (Exact Date)   Weight change: '@WEIGHTCHANGE'$ @ Height:      Ht Readings from Last 3 Encounters:  12/08/21 '5\' 1"'$  (1.549 m)  09/20/21 '5\' 1"'$  (1.549 m)  08/17/21 '5\' 1"'$  (1.549 m)    General appearance: alert, cooperative and appears stated age Head: Normocephalic, without obvious abnormality, atraumatic Neck: no adenopathy, supple, symmetrical, trachea midline and thyroid {CHL AMB PHY EX THYROID NORM [phone]::"normal to inspection and palpation"} Lungs: clear to auscultation bilaterally Cardiovascular: regular rate and rhythm Breasts: {Exam; breast:13139::"normal appearance, no masses or tenderness"} Abdomen: soft, non-tender; non distended,  no masses,  no organomegaly Extremities: extremities normal, atraumatic, no cyanosis or edema Skin: Skin color, texture, turgor normal. No rashes or lesions Lymph nodes: Cervical, supraclavicular, and axillary nodes normal. No abnormal inguinal nodes palpated Neurologic: Grossly normal   Pelvic: External genitalia:  no lesions              Urethra:  normal appearing urethra with no masses, tenderness or lesions              Bartholins and Skenes: normal                 Vagina: normal appearing vagina with normal color and discharge, no lesions              Cervix: {CHL AMB PHY EX  CERVIX NORM DEFAULT:[phone]::"no lesions"}               Bimanual Exam:  Uterus:  {CHL AMB PHY EX UTERUS NORM DEFAULT:[phone]::"normal size, contour, position, consistency, mobility, non-tender"}              Adnexa: {CHL AMB PHY EX ADNEXA NO MASS DEFAULT:[phone]::"no mass, fullness, tenderness"}  Rectovaginal: Confirms               Anus:  normal sphincter tone, no lesions  *** chaperoned for the exam.  A:  Well Woman with normal exam  P:

## 2022-02-17 ENCOUNTER — Encounter: Payer: Self-pay | Admitting: Family Medicine

## 2022-02-17 ENCOUNTER — Ambulatory Visit (INDEPENDENT_AMBULATORY_CARE_PROVIDER_SITE_OTHER): Payer: PPO | Admitting: Family Medicine

## 2022-02-17 VITALS — BP 128/76 | HR 84 | Temp 96.9°F | Ht 61.0 in | Wt 130.0 lb

## 2022-02-17 DIAGNOSIS — L509 Urticaria, unspecified: Secondary | ICD-10-CM

## 2022-02-17 DIAGNOSIS — T7840XA Allergy, unspecified, initial encounter: Secondary | ICD-10-CM

## 2022-02-17 MED ORDER — PREDNISONE 10 MG (21) PO TBPK: ORAL_TABLET | ORAL | 0 refills | Status: DC

## 2022-02-17 NOTE — Progress Notes (Signed)
Chief Complaint  Patient presents with   Urticaria    Has hives and itching on arms, trunk, back, face and neck. Went to the nail salon on Tuesday and they dip her nails in a powder and she got some on her face area.    Woke up 3:30 am Wednesday morning with an itchy scalp, itchy behind her ears, then later that morning she developed itching at her arms, chest/torso. She noted splotches of red areas on the arms, and this got worse throughout the day. Also has itching on lower legs (not on her thighs).  She has been taking benadryl, 1 tablet every 3 hours, which has been helping some. (She had stopped taking zyrtec due to leg cramps at night, which haven't recurred since stopping the med.)  The day before onset of itching and rash, she did an SNS dip, during the process she tried to wipe her hair away from her face, and salon person noted shortly after that her face was red (got the chemical/powder on her).  She washed her face with a cloth, it spread a bit, but finally got it all off.  No rash on hands where she was in contact with the powder, and she gets these types of manicures regularly.  No changes in diet (had wine, cheese/cracker at a neighbors the night before). No new products--detergents, lotions, shampoos, or other new contacts.  She lost her voice last week (12/14), followed by a cough. She was taking mucinex and xicam, and was better by 12/18.  She has taken these medications in the past without issues. Denies any other changes/meds/products.  PMH, PSH, SH reviewed  Outpatient Encounter Medications as of 02/17/2022  Medication Sig Note   amLODipine (NORVASC) 5 MG tablet Take 1 tablet (5 mg total) by mouth daily.    b complex vitamins capsule Take 1 capsule by mouth daily. 09/20/2021: '100mg'$    Calcium Carbonate-Vitamin D (CALCIUM PLUS VITAMIN D PO) Take 2 tablets by mouth daily. 09/20/2021: 2 tab='1200mg'$  Ca+ and 400iu D   cetirizine (ZYRTEC) 10 MG tablet Take 10 mg by mouth  daily.    Cholecalciferol (VITAMIN D) 125 MCG (5000 UT) CAPS Take 1 capsule by mouth daily.    diphenhydrAMINE (BENADRYL) 25 MG tablet Take 25 mg by mouth every 6 (six) hours as needed. 02/17/2022: Last dose 1pm   docusate sodium (COLACE) 100 MG capsule Take 100 mg by mouth daily. 06/03/2021: Takes one qhs   estradiol (ESTRACE) 0.1 MG/GM vaginal cream  09/20/2021: Had used it BID, was to taper it to qod.  Only using sporadically now   famotidine (PEPCID) 40 MG tablet TAKE 1 TABLET BY MOUTH TWICE A DAY    HYDROcodone-acetaminophen (NORCO/VICODIN) 5-325 MG tablet Take 0.5 tablets by mouth every 6 (six) hours as needed for moderate pain. 06/03/2021: 1/2 tab daily   lamoTRIgine (LAMICTAL) 150 MG tablet Take 150 mg by mouth at bedtime.    Multiple Vitamins-Minerals (CENTRUM SILVER 50+WOMEN PO) Take 1 tablet by mouth.    Multiple Vitamins-Minerals (HAIR SKIN AND NAILS FORMULA PO) Take 3 capsules by mouth daily.    risedronate (ACTONEL) 150 MG tablet TAKE 1 TABLET BY MOUTH EVERY 30 DAYS WITH WATER ON AN EMPTY STOMACH NOTHING BY MOUTH OR LIE DOWN FOR NEXT 30 MINUTES    zolpidem (AMBIEN) 10 MG tablet Take 2.5 mg by mouth at bedtime.  06/03/2021: Takes every night (1/4 tablet or less)   clobetasol ointment (TEMOVATE) 0.05 % Apply a small amount topically BID x  2 weeks (Patient not taking: Reported on 02/17/2022) 02/17/2022: prn   diltiazem 2 % GEL Using your index finger, apply a small amount of medication inside the rectum up to your first knuckle/joint twice daily x 8 weeks. (Patient not taking: Reported on 09/20/2021)    FLOVENT HFA 110 MCG/ACT inhaler Inhale 2 puffs into the lungs 2 (two) times daily. (Patient not taking: Reported on 09/20/2021) 06/03/2021: prn   Liniments (SALONPAS PAIN RELIEF PATCH EX) Place 1 patch onto the skin daily as needed (pain). (Patient not taking: Reported on 02/17/2022) 02/17/2022: prn   PROAIR HFA 108 (90 Base) MCG/ACT inhaler Inhale 2 puffs into the lungs every 4 (four) hours as  needed. (Patient not taking: Reported on 09/20/2021) 02/17/2022: prn   No facility-administered encounter medications on file as of 02/17/2022.   Allergies  Allergen Reactions   Cymbalta [Duloxetine Hcl]     Abdominal pain   Nitrofurantoin [Macrodantin]    Sulfamethoxazole Hives   Cefdinir Rash   ROS:  No f/c, no n/v/d.  Some hoarseness and cough last week, per HPI, resolved. No chest pain or shortness of breath.  No mouth or throat swelling. Denies shortness of breath, just a slight tightness in the chest. Rash per HPI.   PHYSICAL EXAM:  BP 128/76   Pulse 84   Temp (!) 96.9 F (36.1 C) (Tympanic)   Ht '5\' 1"'$  (1.549 m)   Wt 130 lb (59 kg)   LMP 08/15/1988 (Exact Date)   BMI 24.56 kg/m   Well-appearing female, in no distress. Periodically scratching at her torso, arms. HEENT: conjunctiva and sclera are clear, EOMI. OP is clear, no swelling. Heart: regular rate and rhythm Lungs: clear bilaterally, no wheezes, rales, ronchi Skin: Diffusely erythematous (coalesced patches, with very few areas of clearing) on the back and abdomen. Patch of raised erythema at R upper forehead, and a few small areas elsewhere on the face, R lower chin. Some patchy areas of raised erythema at the lower chest (above bra area). She has large patches of erythema and swelling at the left antecubital fossa and bilaterally anterior wrists. Forearms appear normal (for her--with the patchy rash, unchanged). Legs not examined.   ASSESSMENT/PLAN:  Urticaria - normal OP and lung exam. Unclear trigger, poss related to exposure at nail salon. Antihistamines, prednisone (risks/SE reviewed), other supportive measures - Plan: predniSONE (STERAPRED UNI-PAK 21 TAB) 10 MG (21) TBPK tablet  Allergic reaction, initial encounter - Plan: predniSONE (STERAPRED UNI-PAK 21 TAB) 10 MG (21) TBPK tablet  Take a once daily anithistamine (either restarting the zyrtec, but trying it in the morning, since it caused cramps at  night, vs trying allegra or claritin instead of zyrtec). You may continue to use benadryl as needed for rash/hives/itching. You can use topical medications, if needed, to help with itching (Sarna anti-itch, menthol-type creams, hydrocortisone cream, topical calamine/caladryl lotions), Aveeno baths.  Take the prednisone as directed. If you start to have recurrent rash as the dose decreases or upon completing the course, let us know; we may need to prescribe some additional low dose prednisone to extend the course.

## 2022-02-17 NOTE — Patient Instructions (Signed)
Take a once daily anithistamine (either restarting the zyrtec, but trying it in the morning, since it caused cramps at night, vs trying allegra or claritin instead of zyrtec). You may continue to use benadryl as needed for rash/hives/itching. You can use topical medications, if needed, to help with itching (Sarna anti-itch, menthol-type creams, hydrocortisone cream, topical calamine/caladryl lotions), Aveeno baths.  Take the prednisone as directed. If you start to have recurrent rash as the dose decreases or upon completing the course, let us know; we may need to prescribe some additional low dose prednisone to extend the course.

## 2022-02-24 ENCOUNTER — Telehealth: Payer: Self-pay | Admitting: Family Medicine

## 2022-02-24 MED ORDER — GABAPENTIN 100 MG PO CAPS: ORAL_CAPSULE | ORAL | 0 refills | Status: DC

## 2022-02-24 NOTE — Telephone Encounter (Signed)
Pt called and states that she was on prednisone for her neck pain. That medication has ended and the neck pain has returned. She states that as her last visit for next pain she was offered gabapentin to start. She would like that sent into [facility] at [address] of [address]. Pt can be reached at [phone].

## 2022-02-24 NOTE — Telephone Encounter (Signed)
Discussion from 10/11 visit (info on her AVS):  "'100mg'$  at bedtime for a week. If you tolerate this without side effects, and you are still having a lot of pain at night, you can increase to '200mg'$  at bedtime. If you're sleeping fine at night, no pain at night, but still a lot of pain during the day, then we can add '100mg'$  dose in the morning. We can slowly increase the dose (up to '300mg'$  three times daily) IF NEEDED if you are still having a lot of pain. We have to balance the tiredness/grogginess from the medication with the pain.  If we go very slowly, we can get the dose up gradually and have it help with the pain.  Some people are much more sensitive to this medication, and can't tolerate high doses, and other people can tolerate very high doses.  We only need to find a dose that helps with pain."  Please remind pt of this info (on her AVS). I'm sending in rx for '100mg'$ , with instruction to increase the dose at bedtime once a week to '300mg'$ , if tolerated. She will need to let us know how she is doing with dose changes, touching base within 3-4 weeks (is she tolerating, how much is she taking, is pain any better).

## 2022-03-16 ENCOUNTER — Ambulatory Visit
Admission: RE | Admit: 2022-03-16 | Discharge: 2022-03-16 | Disposition: A | Discharge status: Home / Self Care | Payer: PPO | Source: Ambulatory Visit | Attending: Family Medicine | Admitting: Family Medicine

## 2022-03-16 DIAGNOSIS — M85851 Other specified disorders of bone density and structure, right thigh: Secondary | ICD-10-CM | POA: Diagnosis not present

## 2022-03-16 DIAGNOSIS — M810 Age-related osteoporosis without current pathological fracture: Secondary | ICD-10-CM | POA: Diagnosis not present

## 2022-03-16 DIAGNOSIS — M8580 Other specified disorders of bone density and structure, unspecified site: Secondary | ICD-10-CM

## 2022-03-16 DIAGNOSIS — Z780 Asymptomatic menopausal state: Secondary | ICD-10-CM | POA: Diagnosis not present

## 2022-03-20 DIAGNOSIS — M810 Age-related osteoporosis without current pathological fracture: Secondary | ICD-10-CM | POA: Insufficient documentation

## 2022-03-20 NOTE — Progress Notes (Signed)
No chief complaint on file.   Patient presents to discuss the results of her bone density test.  She has h/o osteopenia with elevated FRAX score in 11/2019 (T-2.4 at L forearm/radius, T-2.2 at R fem neck; FRAX score was 15.1% for major osteoporotic fracture, 4.6% for hip fracture (elevated). She was started on Actonel '150mg'$  monthly, reports compliance and denies side effects.  No chest pain, no change in heartburn related to when she takes the medication, no nausea. She is compliant with her Ca and D.  She gets weight-bearing execises 3x/week.  Bone density test was repeated earlier this month, showing osteoporosis at forearm/radius (assessment of report states RIGHT, but lower in the report, it said LEFT ??):  ASSESSMENT: The BMD measured at Forearm Radius 33% is 0.650 g/cm2 with a T-score of -2.6.   This patient is considered osteoporotic according to [facility]) criteria. The quality of the exam is good.   Left hip excluded due to surgical hardware.   The lumbar spine was excluded due to degenerative changes.   Site Region Measured Date Measured Age [age]A BMD Significant CHANGE T-score   Left Forearm Radius 33% 1/16/20[age].5 -2.4 0.650 g/cm2 Left Forearm Radius 33% 12/10/2019 76.3 -2.4 0.678 g/cm2 *   Right Femur Neck 1/16/2022 78.5 -2.1 0.750 g/cm2 Right Femur Neck 12/10/2019 76.3 -2.2 0.729 g/cm2   Right Femur Total 03/16/2022 78.5 -0.8 0.912 g/cm2 Right Femur Total 12/10/2019 76.3 -0.9 0.892 g/cm2   PMH, PSH, SH reviewed   ROS:   PHYSICAL EXAM:  LMP 08/15/1988 (Exact Date)   Wt Readings from Last 3 Encounters:  02/17/22 130 lb (59 kg)  12/08/21 129 lb 12.8 oz (58.9 kg)  09/20/21 128 lb (58.1 kg)     ASSESSMENT/PLAN:   Confirm compliance with actonel, Ca, D, weight-bearing exercise

## 2022-03-21 ENCOUNTER — Ambulatory Visit: Payer: PPO | Admitting: Family Medicine

## 2022-03-21 ENCOUNTER — Encounter: Payer: Self-pay | Admitting: Family Medicine

## 2022-03-21 VITALS — BP 120/74 | HR 68 | Ht 61.0 in | Wt 131.0 lb

## 2022-03-22 NOTE — Progress Notes (Signed)
PATIENT WAS NOT SEEN TODAY SHE WAS ROOMED (AND CHART HAD BEEN PREPPED), BUT PATIENT HAD TO LEAVE PRIOR TO BEING SEEN. APPT IS BEING RESCHEDULED

## 2022-03-24 DIAGNOSIS — L290 Pruritus ani: Secondary | ICD-10-CM | POA: Diagnosis not present

## 2022-03-24 DIAGNOSIS — N898 Other specified noninflammatory disorders of vagina: Secondary | ICD-10-CM | POA: Diagnosis not present

## 2022-03-24 DIAGNOSIS — N816 Rectocele: Secondary | ICD-10-CM | POA: Diagnosis not present

## 2022-03-25 DIAGNOSIS — N898 Other specified noninflammatory disorders of vagina: Secondary | ICD-10-CM | POA: Diagnosis not present

## 2022-03-25 DIAGNOSIS — L290 Pruritus ani: Secondary | ICD-10-CM | POA: Diagnosis not present

## 2022-03-25 DIAGNOSIS — N390 Urinary tract infection, site not specified: Secondary | ICD-10-CM | POA: Diagnosis not present

## 2022-03-27 NOTE — Progress Notes (Unsigned)
No chief complaint on file.  Patient presents to discuss the results of her bone density test.  She has h/o osteopenia with elevated FRAX score in 11/2019 (T-2.4 at L forearm/radius, T-2.2 at R fem neck; FRAX score was 15.1% for major osteoporotic fracture, 4.6% for hip fracture (elevated). She was started on Actonel '150mg'$  monthly, reports compliance and denies side effects.  No chest pain, no change in heartburn related to when she takes the medication, no nausea. She is compliant with her Ca and D.  She gets weight-bearing execises 3x/week.   Bone density test was repeated earlier this month, showing osteoporosis at forearm/radius (assessment of report states RIGHT, but lower in the report, it said LEFT ??):   ASSESSMENT: The BMD measured at Forearm Radius 33% is 0.650 g/cm2 with a T-score of -2.6.   This patient is considered osteoporotic according to [facility]) criteria. The quality of the exam is good.   Left hip excluded due to surgical hardware.   The lumbar spine was excluded due to degenerative changes.   Site Region Measured Date Measured Age YA BMD Significant CHANGE T-score   Left Forearm Radius 33% 03/16/2022 78.5 -2.6 0.650 g/cm2 Left Forearm Radius 33% 12/10/2019 76.3 -2.4 0.678 g/cm2 *   Right Femur Neck 03/16/2022 78.5 -2.1 0.750 g/cm2 Right Femur Neck 12/10/2019 76.3 -2.2 0.729 g/cm2   Right Femur Total 03/16/2022 78.5 -0.8 0.912 g/cm2 Right Femur Total 12/10/2019 76.3 -0.9 0.892 g/cm2     PMH, PSH, SH reviewed     ROS:     PHYSICAL EXAM:   LMP 08/15/1988 (Exact Date)   Wt Readings from Last 3 Encounters:  03/21/22 131 lb (59.4 kg)  02/17/22 130 lb (59 kg)  12/08/21 129 lb 12.8 oz (58.9 kg)          ASSESSMENT/PLAN:     Confirm compliance with actonel, Ca, D, weight-bearing exercise

## 2022-03-28 ENCOUNTER — Ambulatory Visit (INDEPENDENT_AMBULATORY_CARE_PROVIDER_SITE_OTHER): Payer: PPO | Admitting: Family Medicine

## 2022-03-28 ENCOUNTER — Encounter: Payer: Self-pay | Admitting: Family Medicine

## 2022-03-28 VITALS — BP 120/68 | HR 64 | Ht 61.0 in | Wt 129.4 lb

## 2022-03-28 DIAGNOSIS — M542 Cervicalgia: Secondary | ICD-10-CM | POA: Diagnosis not present

## 2022-03-28 DIAGNOSIS — M810 Age-related osteoporosis without current pathological fracture: Secondary | ICD-10-CM

## 2022-03-28 DIAGNOSIS — E559 Vitamin D deficiency, unspecified: Secondary | ICD-10-CM

## 2022-03-28 DIAGNOSIS — G8929 Other chronic pain: Secondary | ICD-10-CM

## 2022-03-28 DIAGNOSIS — R5383 Other fatigue: Secondary | ICD-10-CM

## 2022-03-28 NOTE — Patient Instructions (Addendum)
Your body can't absorb too much calcium all at once. Don't take both calcium pills together, take one twice daily instead. Your total calcium intake should be between 1200-'1500mg'$  from ALL sources--including diet and vitamins. If you have a diet with a lot of calcium (milk, yogurt, cheese, kale, collards, etc) you may not need both pills every day.   We are checking your vitamin D level, as it was on the low side in April.  We discussed the risks of both the medicine you are currently taking, and of Prolia injections every 6 months. If your vitamin D level is normal, then I think we should look into the cost and consider switching to Prolia (since your bones have gotten weaker despite being on the monthly oral medication). If your vitamin D is low, then we can work on fixing that, and give the risedronate another 2 years and recheck the bone density again.  You aren't getting enough of a dose of gabapentin to truly help with pain. You are limited by feeling tired.  I would recommend stopping the ambien while taking the gabapentin. You can then likely take a higher dose, which might help more. You can feel free to stop the gabapentin entirely, to see if your energy improves.  If it doesn't (stop it for a full week), then your tiredness may not be from the medication. If your energy is much better when you stop it, try taking just the gabapentin alone ,without the ambien, and see how you feel.

## 2022-03-29 LAB — VITAMIN D 25 HYDROXY (VIT D DEFICIENCY, FRACTURES): Vit D, 25-Hydroxy: 44.2 ng/mL (ref 30.0–100.0)

## 2022-04-02 DIAGNOSIS — F3342 Major depressive disorder, recurrent, in full remission: Secondary | ICD-10-CM | POA: Diagnosis not present

## 2022-04-04 DIAGNOSIS — N816 Rectocele: Secondary | ICD-10-CM | POA: Diagnosis not present

## 2022-04-04 DIAGNOSIS — N904 Leukoplakia of vulva: Secondary | ICD-10-CM | POA: Diagnosis not present

## 2022-04-04 DIAGNOSIS — L900 Lichen sclerosus et atrophicus: Secondary | ICD-10-CM | POA: Diagnosis not present

## 2022-04-04 DIAGNOSIS — N815 Vaginal enterocele: Secondary | ICD-10-CM | POA: Diagnosis not present

## 2022-04-04 DIAGNOSIS — K5902 Outlet dysfunction constipation: Secondary | ICD-10-CM | POA: Diagnosis not present

## 2022-04-04 DIAGNOSIS — N952 Postmenopausal atrophic vaginitis: Secondary | ICD-10-CM | POA: Diagnosis not present

## 2022-04-04 HISTORY — PX: RECTOCELE REPAIR: SHX761

## 2022-04-13 DIAGNOSIS — R252 Cramp and spasm: Secondary | ICD-10-CM | POA: Diagnosis not present

## 2022-04-20 ENCOUNTER — Other Ambulatory Visit: Payer: Self-pay

## 2022-04-22 ENCOUNTER — Other Ambulatory Visit (HOSPITAL_COMMUNITY): Payer: Self-pay

## 2022-04-22 ENCOUNTER — Telehealth: Payer: Self-pay | Admitting: Family Medicine

## 2022-04-22 MED ORDER — DENOSUMAB 60 MG/ML ~~LOC~~ SOSY
60.0000 mg | PREFILLED_SYRINGE | Freq: Once | SUBCUTANEOUS | 0 refills | Status: DC
  Filled 2022-04-22: qty 1, 180d supply, fill #0
  Filled 2022-04-22 (×2): qty 1, 1d supply, fill #0

## 2022-04-22 NOTE — Telephone Encounter (Signed)
Pt called and advised medication approved and cost concerning Prolia. Pt advised that medication will be less expensive thru Pharmacy benefits. Pharmacy benefits is $200 and buy and bill is estimated is $317. Pt is going to get thru pharmacy. PT advised that I would have a CMA order medication and have delivered to [facility] and she would receive a call for payment. Pt advised to call as leaving so med so medication can be set out. Pt advised understanding.

## 2022-04-25 DIAGNOSIS — M7062 Trochanteric bursitis, left hip: Secondary | ICD-10-CM | POA: Diagnosis not present

## 2022-04-25 DIAGNOSIS — Z96642 Presence of left artificial hip joint: Secondary | ICD-10-CM | POA: Diagnosis not present

## 2022-04-28 ENCOUNTER — Other Ambulatory Visit (INDEPENDENT_AMBULATORY_CARE_PROVIDER_SITE_OTHER): Payer: PPO

## 2022-04-28 DIAGNOSIS — M810 Age-related osteoporosis without current pathological fracture: Secondary | ICD-10-CM | POA: Diagnosis not present

## 2022-04-28 MED ORDER — DENOSUMAB 60 MG/ML ~~LOC~~ SOSY
60.0000 mg | PREFILLED_SYRINGE | Freq: Once | SUBCUTANEOUS | Status: AC
  Administered 2022-04-28: 60 mg via SUBCUTANEOUS

## 2022-05-04 DIAGNOSIS — L814 Other melanin hyperpigmentation: Secondary | ICD-10-CM | POA: Diagnosis not present

## 2022-05-04 DIAGNOSIS — L565 Disseminated superficial actinic porokeratosis (DSAP): Secondary | ICD-10-CM | POA: Diagnosis not present

## 2022-05-04 DIAGNOSIS — C44319 Basal cell carcinoma of skin of other parts of face: Secondary | ICD-10-CM | POA: Diagnosis not present

## 2022-05-04 DIAGNOSIS — D1801 Hemangioma of skin and subcutaneous tissue: Secondary | ICD-10-CM | POA: Diagnosis not present

## 2022-05-04 DIAGNOSIS — L821 Other seborrheic keratosis: Secondary | ICD-10-CM | POA: Diagnosis not present

## 2022-05-30 ENCOUNTER — Ambulatory Visit (INDEPENDENT_AMBULATORY_CARE_PROVIDER_SITE_OTHER): Payer: PPO | Admitting: Family Medicine

## 2022-05-30 ENCOUNTER — Encounter: Payer: Self-pay | Admitting: Family Medicine

## 2022-05-30 VITALS — BP 128/70 | HR 68 | Ht 61.0 in | Wt 128.0 lb

## 2022-05-30 DIAGNOSIS — H8113 Benign paroxysmal vertigo, bilateral: Secondary | ICD-10-CM | POA: Diagnosis not present

## 2022-05-30 DIAGNOSIS — J309 Allergic rhinitis, unspecified: Secondary | ICD-10-CM | POA: Diagnosis not present

## 2022-05-30 NOTE — Patient Instructions (Signed)
I recommend using Meclizine (12.5 to 25 mg) every 8 hours if needed for vertigo. Since your vertigo is mainly only laying down, you likely only need to take this at bedtime. It may make you a little sleepy (though likely not as sleepy as the dramamine you've been taking).  You might have some allergies also contributing (based on the congestion in your nose). Consider taking an antihistamine such as claritin or allegra, once a day.  Try and avoid having your head turned to the right or left when laying down (I realize that is very hard--but laying on your back with your head straight seems to not trigger your vertigo).  You declined a referral for physical therapy to help treat this, electing to try the meclizine and give it more time. Let us know if/when you are ready for a referral, to treat positional vertigo.  If you develop new neurologic symptoms--headaches, fainting spells, numbness, tingling, weakness, trouble speaking/thinking--these would need immediate evaluation.   Meclizine is over-the-counter.  It can be found in [place], or certain Dramamine formulations--look for this ingredient (meclizine)--previously has been in the Dramamine-N (for nausea), or the "less drowsy" formulation of Dramamine.

## 2022-05-30 NOTE — Progress Notes (Signed)
Chief Complaint  Patient presents with   Dizziness    Vertigo mostly at night, when she lays down on left side or when she pops up too quickly. Yesterday she had it during the day.    Patient presents with intermittent vertigo over the last 2 weeks.  This mainly affects her at night, when laying down, and if she sits up too quickly.  Yesterday, she laid down to rest at 2pm, started with the spinning. Had some spinning when she rolls over onto her left side, but also when she sits up at the side of the bed. It spun really fast, for under a minute. It is usually just at night.  At chair yoga today, while bending forward to touch her toes, she also felt some vertigo.  Took dramamine (not meclizine?) at 3pm ysterday afternoon.   Has taken it about 8-9 times, usually at bedtime, and it helps some.  Last night she got up twice--once for a cramp in her RLE, and when she started to get up for the cramp, she also had some vertigo, and another time when she got up to go to the bathroom.  Had positional vertigo about 2 years ago, recalls doing exercises to help. She moves her head around sometimes when she has the dizziness (trying to reproduce what she thought was the epley maneuver, but was not correct), but seems to feel better with changing positions like that.  She denies allergy symptoms, just a slightly "hacky cough" which she relates to her asthma.  "I haven't been myself since my surgery" 2/6, rectocele repair.  PMH, PSH, SH reviewed  Outpatient Encounter Medications as of 05/30/2022  Medication Sig Note   amLODipine (NORVASC) 5 MG tablet Take 1 tablet (5 mg total) by mouth daily.    b complex vitamins capsule Take 1 capsule by mouth daily. 09/20/2021: 100mg    Calcium Carbonate-Vitamin D (CALCIUM PLUS VITAMIN D PO) Take 2 tablets by mouth daily. 09/20/2021: 2 tab=1200mg  Ca+ and 400iu D   Cholecalciferol (VITAMIN D) 125 MCG (5000 UT) CAPS Take 1 capsule by mouth daily.    dimenhyDRINATE  (DRAMAMINE) 50 MG tablet Take 50 mg by mouth every 8 (eight) hours as needed. 05/30/2022: Took last night   estradiol (ESTRACE) 0.1 MG/GM vaginal cream  09/20/2021: Had used it BID, was to taper it to qod.  Only using sporadically now   famotidine (PEPCID) 40 MG tablet TAKE 1 TABLET BY MOUTH TWICE A DAY 05/30/2022: Once daily   FIBER ADULT GUMMIES PO Take 3 each by mouth daily at 6 (six) AM.    HYDROcodone-acetaminophen (NORCO/VICODIN) 5-325 MG tablet Take 0.5 tablets by mouth every 6 (six) hours as needed for moderate pain. 05/30/2022: 1/2 tablet daily    lamoTRIgine (LAMICTAL) 150 MG tablet Take 150 mg by mouth at bedtime.    Liniments (SALONPAS PAIN RELIEF PATCH EX) Place 1 patch onto the skin daily as needed (pain). 05/30/2022: Currently using   Multiple Vitamins-Minerals (CENTRUM SILVER 50+WOMEN PO) Take 1 tablet by mouth.    Multiple Vitamins-Minerals (HAIR SKIN AND NAILS FORMULA PO) Take 3 capsules by mouth daily.    zolpidem (AMBIEN) 10 MG tablet Take 2.5 mg by mouth at bedtime.  06/03/2021: Takes every night (1/4 tablet or less)   [DISCONTINUED] risedronate (ACTONEL) 150 MG tablet TAKE 1 TABLET BY MOUTH EVERY 30 DAYS WITH WATER ON AN EMPTY STOMACH NOTHING BY MOUTH OR LIE DOWN FOR NEXT 30 MINUTES    cetirizine (ZYRTEC) 10 MG tablet Take  10 mg by mouth daily. (Patient not taking: Reported on 02/17/2022) 05/30/2022: As needed   clobetasol ointment (TEMOVATE) 0.05 % Apply a small amount topically BID x 2 weeks (Patient not taking: Reported on 02/17/2022) 05/30/2022: As needed   diltiazem 2 % GEL Using your index finger, apply a small amount of medication inside the rectum up to your first knuckle/joint twice daily x 8 weeks. (Patient not taking: Reported on 09/20/2021) 05/30/2022: As needed   diphenhydrAMINE (BENADRYL) 25 MG tablet Take 25 mg by mouth every 6 (six) hours as needed. (Patient not taking: Reported on 03/21/2022) 05/30/2022: As needed   FLOVENT HFA 110 MCG/ACT inhaler Inhale 2 puffs into the lungs 2  (two) times daily. (Patient not taking: Reported on 09/20/2021) 05/30/2022: prn   gabapentin (NEURONTIN) 100 MG capsule Take 1 capsule by mouth at bedtime. If tolerating, increase to 2 capsules after a week. If needed, can further increase to 3 pills at bedtime after another week (if tolerating) (Patient not taking: Reported on 05/30/2022) 05/30/2022: No longer taking   PROAIR HFA 108 (90 Base) MCG/ACT inhaler Inhale 2 puffs into the lungs every 4 (four) hours as needed. (Patient not taking: Reported on 09/20/2021) 05/30/2022: As needed   [DISCONTINUED] docusate sodium (COLACE) 100 MG capsule Take 100 mg by mouth daily. (Patient not taking: Reported on 05/30/2022) 06/03/2021: Takes one qhs   No facility-administered encounter medications on file as of 05/30/2022.   Allergies  Allergen Reactions   Cymbalta [Duloxetine Hcl]     Abdominal pain   Nitrofurantoin [Macrodantin]    Sulfamethoxazole Hives   Cefdinir Rash   ROS:  No f/c, UTI symptoms. Denies allergies, poss some PND vs asthma (hacky cough intermittently). Some L neck and ear pain. No significant headaches (just related to the neck pain). No nausea or vomiting. No diarrhea (has chronic constipation, unchanged) no abdominal pain, no urinary complaints. See HPI   PHYSICAL EXAM:  BP 128/70   Pulse 68   Ht 5\' 1"  (1.549 m)   Wt 128 lb (58.1 kg)   LMP 08/15/1988 (Exact Date)   BMI 24.19 kg/m   Wt Readings from Last 3 Encounters:  05/30/22 128 lb (58.1 kg)  03/28/22 129 lb 6.4 oz (58.7 kg)  03/21/22 131 lb (59.4 kg)   Pleasant, well-appearing female.  In mild discomfort at her L neck when looking side to side (both ways), with limited ROM in looking to the left due to pain. HEENT: conjunctiva and sclera are clear, EOMI. Nasal mucosa is mildly edematous, with some yellow crusting noted bilaterally, and some clear mucus. TM's and EAC's normal bilaterally. OP is clear. Sinuses are nontender Neck: no lymphadenopathy or mass, no bruit Heart:  regular rate and rhythm Lungs: clear bilaterally Neuro: alert and oriented, cranial nerves intact. Normal finger to nose.  Normal DTR's, strength, sensation in upper and lower extremities. Vertigo and "wooziness" developed with turning her head to the right and laying down. She closed her eyes, but when asked to open, no nystagmus was noted. She also had slight, short-lived dizziness when she sat up (quickly) with head straight (no symptoms when laying down). Limited ROM of looking to her left. Had vertigo/spinning with laying down with head turned to the left. She felt this was worse than when her head was turned to the right. No nystagmus noted   ASSESSMENT/PLAN:  Benign paroxysmal positional vertigo due to bilateral vestibular disorder - bilateral, when reclined, worse w/head turned to the L than the R. Declined PT. Discussed  meclizine, triggering position avoidance. Will call for PT prn  Allergic rhinitis, unspecified seasonality, unspecified trigger - discussed this can potentially trigger vertigo as well. Discussed OTC antihistamines prn  Declines referral to PT at this time, wants to give it longer to see if it gets better on its own.  I spent 32 minutes dedicated to the care of this patient, including pre-visit review of records, face to face time, post-visit ordering of testing and documentation.   I recommend using Meclizine (12.5 to 25 mg) every 8 hours if needed for vertigo. Since your vertigo is mainly only laying down, you likely only need to take this at bedtime. It may make you a little sleepy (though likely not as sleepy as the dramamine you've been taking).  You might have some allergies also contributing (based on the congestion in your nose). Consider taking an antihistamine such as claritin or allegra, once a day.  Try and avoid having your head turned to the right or left when laying down (I realize that is very hard--but laying on your back with your head straight  seems to not trigger your vertigo).  You declined a referral for physical therapy to help treat this, electing to try the meclizine and give it more time. Let us know if/when you are ready for a referral, to treat positional vertigo.  If you develop new neurologic symptoms--headaches, fainting spells, numbness, tingling, weakness, trouble speaking/thinking--these would need immediate evaluation.

## 2022-06-08 DIAGNOSIS — Z96642 Presence of left artificial hip joint: Secondary | ICD-10-CM | POA: Diagnosis not present

## 2022-06-08 DIAGNOSIS — M25552 Pain in left hip: Secondary | ICD-10-CM | POA: Diagnosis not present

## 2022-06-09 DIAGNOSIS — Z96642 Presence of left artificial hip joint: Secondary | ICD-10-CM | POA: Diagnosis not present

## 2022-06-09 DIAGNOSIS — H9202 Otalgia, left ear: Secondary | ICD-10-CM | POA: Diagnosis not present

## 2022-06-16 ENCOUNTER — Telehealth: Payer: Self-pay

## 2022-06-16 NOTE — Progress Notes (Signed)
Patient ID: [name], female   DOB: 11-14-1945, 77 y.o.   MRN: [id number]  Care Management & Coordination Services Pharmacy Team  Reason for Encounter: General adherence update   Contacted patient for general health update and medication adherence call.  Spoke with patient on 06/21/2022   What concerns do you have about your medications? Patient reports none  The patient denies side effects with their medications.   How often do you forget or accidentally miss a dose? Never  Do you use a pillbox? No  Are you having any problems getting your medications from your pharmacy? No  Has the cost of your medications been a concern? No  Since last visit with PharmD, the following interventions have been made.  Patient was instructed to Stop Docusate and Stopped Risedronate Recommended Meclizine  The patient has not had an ED visit since last contact.   The patient denies problems with their health.   Patient denies concerns or questions for Milas Kocher, PharmD at this time.   Counseled patient on: Great job taking medications and Access to carecoordination team for any cost, medication or pharmacy concerns.    Chart Updates:  Recent office visits:  05/30/22 Joselyn Arrow, MD - Patient presented for Benign paroxysmal positional vertigo due to bilateral vestibular disorder and other concerns Stopped Docusate. Stopped Risedronate.   03/28/22 Joselyn Arrow, MD - Patient presented for Osteoporosis without current pathological fracture and other concerns. No medication changes.   03/21/22 Joselyn Arrow, MD - Patient presented for Osteoporosis without current pathological fracture and other concerns. Stopped Prednisone.   02/17/22 Joselyn Arrow, MD - Patient presented for Urticaria and other concerns. Prescribed Prednisone 10 mg.   Recent consult visits:  05/24/22 Lucina Mellow, NP  (Urology) - Patient presented for Rectocele and other concerns. No medication changes.   04/25/22 [place],  [employer] ([facility]) - Patient presented for Pain of left hip joint and other concerns. Cortisone Injection administered.   04/04/22 Tomasa Hose, MD  - Patient presented to [facility] for PR Post and other concerns. No medication changes.   Hospital visits:  None in previous 6 months  Medications: Outpatient Encounter Medications as of 06/16/2022  Medication Sig Note   amLODipine (NORVASC) 5 MG tablet Take 1 tablet (5 mg total) by mouth daily.    b complex vitamins capsule Take 1 capsule by mouth daily. 09/20/2021: 100mg    Calcium Carbonate-Vitamin D (CALCIUM PLUS VITAMIN D PO) Take 2 tablets by mouth daily. 09/20/2021: 2 tab=1200mg  Ca+ and 400iu D   cetirizine (ZYRTEC) 10 MG tablet Take 10 mg by mouth daily. (Patient not taking: Reported on 02/17/2022) 05/30/2022: As needed   Cholecalciferol (VITAMIN D) 125 MCG (5000 UT) CAPS Take 1 capsule by mouth daily.    clobetasol ointment (TEMOVATE) 0.05 % Apply a small amount topically BID x 2 weeks (Patient not taking: Reported on 02/17/2022) 05/30/2022: As needed   diltiazem 2 % GEL Using your index finger, apply a small amount of medication inside the rectum up to your first knuckle/joint twice daily x 8 weeks. (Patient not taking: Reported on 09/20/2021) 05/30/2022: As needed   dimenhyDRINATE (DRAMAMINE) 50 MG tablet Take 50 mg by mouth every 8 (eight) hours as needed. 05/30/2022: Took last night   diphenhydrAMINE (BENADRYL) 25 MG tablet Take 25 mg by mouth every 6 (six) hours as needed. (Patient not taking: Reported on 03/21/2022) 05/30/2022: As needed   estradiol (ESTRACE) 0.1 MG/GM vaginal cream  09/20/2021: Had used it BID, was  to taper it to qod.  Only using sporadically now   famotidine (PEPCID) 40 MG tablet TAKE 1 TABLET BY MOUTH TWICE A DAY 05/30/2022: Once daily   FIBER ADULT GUMMIES PO Take 3 each by mouth daily at 6 (six) AM.    FLOVENT HFA 110 MCG/ACT inhaler Inhale 2 puffs into the lungs 2 (two) times daily. (Patient not taking:  Reported on 09/20/2021) 05/30/2022: prn   gabapentin (NEURONTIN) 100 MG capsule Take 1 capsule by mouth at bedtime. If tolerating, increase to 2 capsules after a week. If needed, can further increase to 3 pills at bedtime after another week (if tolerating) (Patient not taking: Reported on 05/30/2022) 05/30/2022: No longer taking   HYDROcodone-acetaminophen (NORCO/VICODIN) 5-325 MG tablet Take 0.5 tablets by mouth every 6 (six) hours as needed for moderate pain. 05/30/2022: 1/2 tablet daily    lamoTRIgine (LAMICTAL) 150 MG tablet Take 150 mg by mouth at bedtime.    Liniments (SALONPAS PAIN RELIEF PATCH EX) Place 1 patch onto the skin daily as needed (pain). 05/30/2022: Currently using   Multiple Vitamins-Minerals (CENTRUM SILVER 50+WOMEN PO) Take 1 tablet by mouth.    Multiple Vitamins-Minerals (HAIR SKIN AND NAILS FORMULA PO) Take 3 capsules by mouth daily.    PROAIR HFA 108 (90 Base) MCG/ACT inhaler Inhale 2 puffs into the lungs every 4 (four) hours as needed. (Patient not taking: Reported on 09/20/2021) 05/30/2022: As needed   zolpidem (AMBIEN) 10 MG tablet Take 2.5 mg by mouth at bedtime.  06/03/2021: Takes every night (1/4 tablet or less)   No facility-administered encounter medications on file as of 06/16/2022.    Recent vitals BP Readings from Last 3 Encounters:  05/30/22 128/70  03/28/22 120/68  03/21/22 120/74   Pulse Readings from Last 3 Encounters:  05/30/22 68  03/28/22 64  03/21/22 68   Wt Readings from Last 3 Encounters:  05/30/22 128 lb (58.1 kg)  03/28/22 129 lb 6.4 oz (58.7 kg)  03/21/22 131 lb (59.4 kg)   BMI Readings from Last 3 Encounters:  05/30/22 24.19 kg/m  03/28/22 24.45 kg/m  03/21/22 24.75 kg/m    Recent lab results    Component Value Date/Time   NA 138 06/03/2021 1541   K 4.1 06/03/2021 1541   CL 101 06/03/2021 1541   CO2 23 06/03/2021 1541   GLUCOSE 92 06/03/2021 1541   GLUCOSE 105 (H) 08/27/2018 1514   BUN 13 06/03/2021 1541   CREATININE 0.69 06/03/2021  1541   CREATININE 0.65 05/23/2016 1110   CALCIUM 9.1 06/03/2021 1541    Lab Results  Component Value Date   CREATININE 0.69 06/03/2021   GFR 102.41 11/06/2015   EGFR 89 06/03/2021   GFRNONAA 90 12/18/2019   GFRAA 104 12/18/2019   No results found for: "HGBA1C", "FRUCTOSAMINE", "MICROALBUR"  Lab Results  Component Value Date   CHOL 203 (H) 08/29/2019   HDL 77 08/29/2019   LDLCALC 113 (H) 08/29/2019   TRIG 70 08/29/2019   CHOLHDL 2.6 08/29/2019    Care Gaps: COVID Vaccine - Overdue AWV - 09/20/21 Pharm follow up 07/2022  Star Rating Drugs:  None      Pamala Duffel CMA Clinical Pharmacist Assistant [phone]

## 2022-06-20 DIAGNOSIS — M25552 Pain in left hip: Secondary | ICD-10-CM | POA: Diagnosis not present

## 2022-07-01 DIAGNOSIS — M25552 Pain in left hip: Secondary | ICD-10-CM | POA: Diagnosis not present

## 2022-07-01 DIAGNOSIS — M545 Low back pain, unspecified: Secondary | ICD-10-CM | POA: Diagnosis not present

## 2022-07-05 DIAGNOSIS — H8112 Benign paroxysmal vertigo, left ear: Secondary | ICD-10-CM | POA: Diagnosis not present

## 2022-07-18 DIAGNOSIS — M5387 Other specified dorsopathies, lumbosacral region: Secondary | ICD-10-CM | POA: Diagnosis not present

## 2022-07-18 DIAGNOSIS — M9903 Segmental and somatic dysfunction of lumbar region: Secondary | ICD-10-CM | POA: Diagnosis not present

## 2022-07-18 DIAGNOSIS — M9905 Segmental and somatic dysfunction of pelvic region: Secondary | ICD-10-CM | POA: Diagnosis not present

## 2022-07-18 DIAGNOSIS — M9904 Segmental and somatic dysfunction of sacral region: Secondary | ICD-10-CM | POA: Diagnosis not present

## 2022-07-26 DIAGNOSIS — M9903 Segmental and somatic dysfunction of lumbar region: Secondary | ICD-10-CM | POA: Diagnosis not present

## 2022-07-26 DIAGNOSIS — M9904 Segmental and somatic dysfunction of sacral region: Secondary | ICD-10-CM | POA: Diagnosis not present

## 2022-07-26 DIAGNOSIS — M5387 Other specified dorsopathies, lumbosacral region: Secondary | ICD-10-CM | POA: Diagnosis not present

## 2022-07-26 DIAGNOSIS — M9905 Segmental and somatic dysfunction of pelvic region: Secondary | ICD-10-CM | POA: Diagnosis not present

## 2022-07-28 DIAGNOSIS — M5387 Other specified dorsopathies, lumbosacral region: Secondary | ICD-10-CM | POA: Diagnosis not present

## 2022-07-28 DIAGNOSIS — M9905 Segmental and somatic dysfunction of pelvic region: Secondary | ICD-10-CM | POA: Diagnosis not present

## 2022-07-28 DIAGNOSIS — M9904 Segmental and somatic dysfunction of sacral region: Secondary | ICD-10-CM | POA: Diagnosis not present

## 2022-07-28 DIAGNOSIS — M9903 Segmental and somatic dysfunction of lumbar region: Secondary | ICD-10-CM | POA: Diagnosis not present

## 2022-08-03 DIAGNOSIS — M9904 Segmental and somatic dysfunction of sacral region: Secondary | ICD-10-CM | POA: Diagnosis not present

## 2022-08-03 DIAGNOSIS — M9903 Segmental and somatic dysfunction of lumbar region: Secondary | ICD-10-CM | POA: Diagnosis not present

## 2022-08-03 DIAGNOSIS — M9905 Segmental and somatic dysfunction of pelvic region: Secondary | ICD-10-CM | POA: Diagnosis not present

## 2022-08-03 DIAGNOSIS — M5387 Other specified dorsopathies, lumbosacral region: Secondary | ICD-10-CM | POA: Diagnosis not present

## 2022-08-05 DIAGNOSIS — M5387 Other specified dorsopathies, lumbosacral region: Secondary | ICD-10-CM | POA: Diagnosis not present

## 2022-08-05 DIAGNOSIS — M9903 Segmental and somatic dysfunction of lumbar region: Secondary | ICD-10-CM | POA: Diagnosis not present

## 2022-08-05 DIAGNOSIS — M9904 Segmental and somatic dysfunction of sacral region: Secondary | ICD-10-CM | POA: Diagnosis not present

## 2022-08-05 DIAGNOSIS — M9905 Segmental and somatic dysfunction of pelvic region: Secondary | ICD-10-CM | POA: Diagnosis not present

## 2022-08-08 DIAGNOSIS — M9904 Segmental and somatic dysfunction of sacral region: Secondary | ICD-10-CM | POA: Diagnosis not present

## 2022-08-08 DIAGNOSIS — M5387 Other specified dorsopathies, lumbosacral region: Secondary | ICD-10-CM | POA: Diagnosis not present

## 2022-08-08 DIAGNOSIS — M9905 Segmental and somatic dysfunction of pelvic region: Secondary | ICD-10-CM | POA: Diagnosis not present

## 2022-08-08 DIAGNOSIS — M9903 Segmental and somatic dysfunction of lumbar region: Secondary | ICD-10-CM | POA: Diagnosis not present

## 2022-08-11 DIAGNOSIS — M9904 Segmental and somatic dysfunction of sacral region: Secondary | ICD-10-CM | POA: Diagnosis not present

## 2022-08-11 DIAGNOSIS — M9903 Segmental and somatic dysfunction of lumbar region: Secondary | ICD-10-CM | POA: Diagnosis not present

## 2022-08-11 DIAGNOSIS — M5387 Other specified dorsopathies, lumbosacral region: Secondary | ICD-10-CM | POA: Diagnosis not present

## 2022-08-11 DIAGNOSIS — M9905 Segmental and somatic dysfunction of pelvic region: Secondary | ICD-10-CM | POA: Diagnosis not present

## 2022-08-12 ENCOUNTER — Other Ambulatory Visit: Payer: Self-pay | Admitting: Family Medicine

## 2022-08-12 DIAGNOSIS — I10 Essential (primary) hypertension: Secondary | ICD-10-CM

## 2022-08-15 DIAGNOSIS — L570 Actinic keratosis: Secondary | ICD-10-CM | POA: Diagnosis not present

## 2022-08-15 DIAGNOSIS — X32XXXA Exposure to sunlight, initial encounter: Secondary | ICD-10-CM | POA: Diagnosis not present

## 2022-08-18 DIAGNOSIS — M5387 Other specified dorsopathies, lumbosacral region: Secondary | ICD-10-CM | POA: Diagnosis not present

## 2022-08-18 DIAGNOSIS — M9903 Segmental and somatic dysfunction of lumbar region: Secondary | ICD-10-CM | POA: Diagnosis not present

## 2022-08-18 DIAGNOSIS — M9904 Segmental and somatic dysfunction of sacral region: Secondary | ICD-10-CM | POA: Diagnosis not present

## 2022-08-18 DIAGNOSIS — M9905 Segmental and somatic dysfunction of pelvic region: Secondary | ICD-10-CM | POA: Diagnosis not present

## 2022-08-29 IMAGING — MG MM DIGITAL SCREENING BILAT W/ TOMO AND CAD
8 series · 9 of 24 positions shown · non-contrast
Comparison: Previous exam(s).

CLINICAL DATA: Screening.

EXAM:
DIGITAL SCREENING BILATERAL MAMMOGRAM WITH TOMOSYNTHESIS AND CAD
TECHNIQUE: Bilateral screening digital craniocaudal and mediolateral oblique
mammograms were obtained. Bilateral screening digital breast
tomosynthesis was performed. The images were evaluated with
computer-aided detection.

[L CC synth-2D]
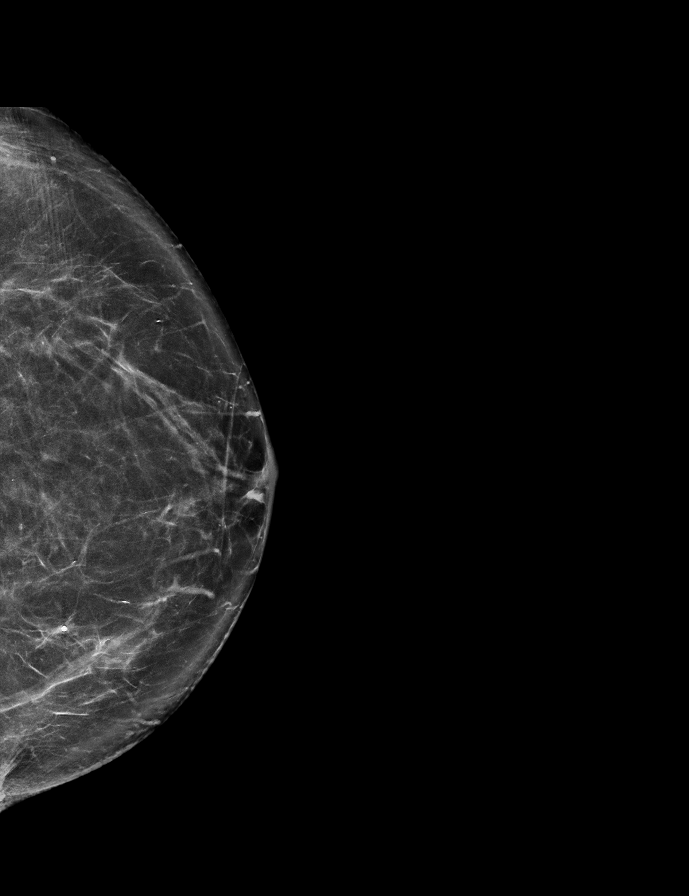

[R CC synth-2D]
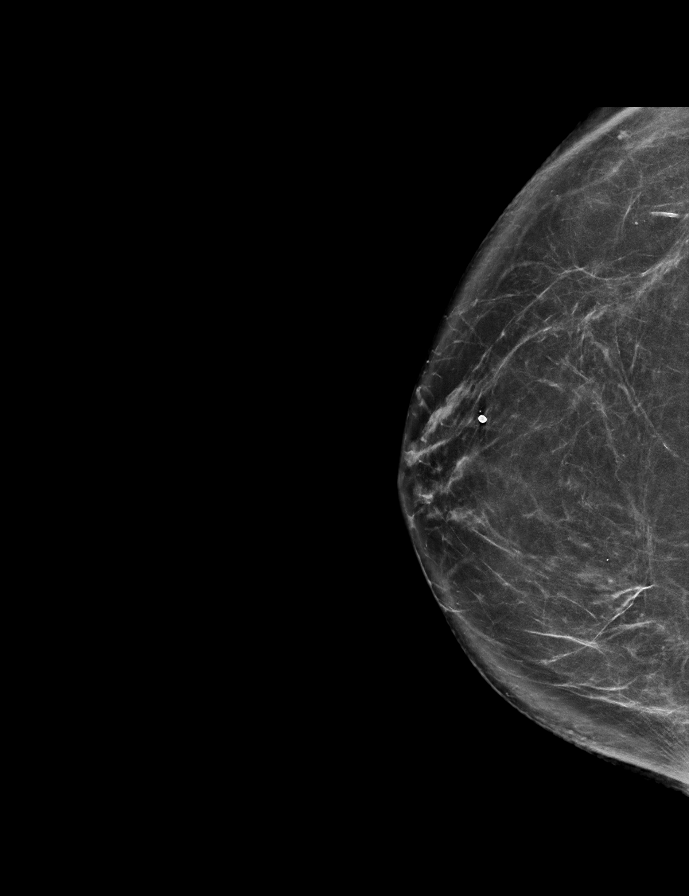

[L MLO synth-2D]
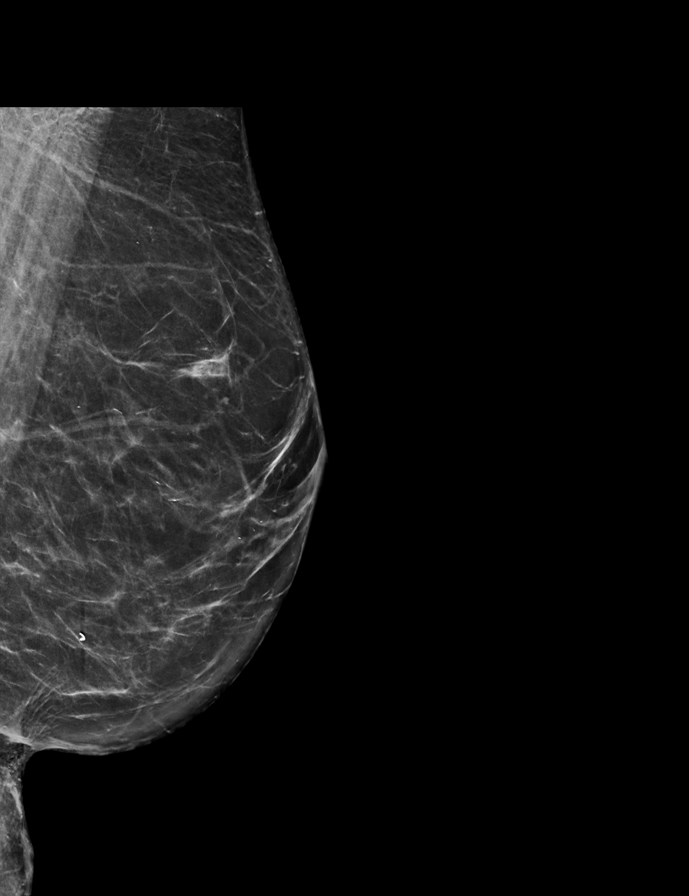

[R MLO synth-2D]
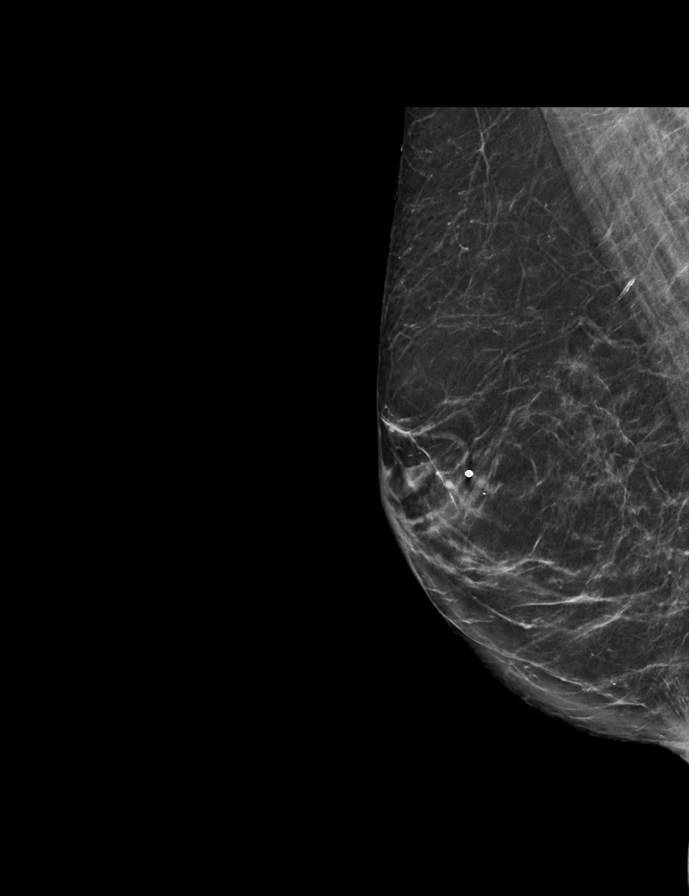

[L CC tomo · 2 of 73 frames shown]
[frame 24/73]
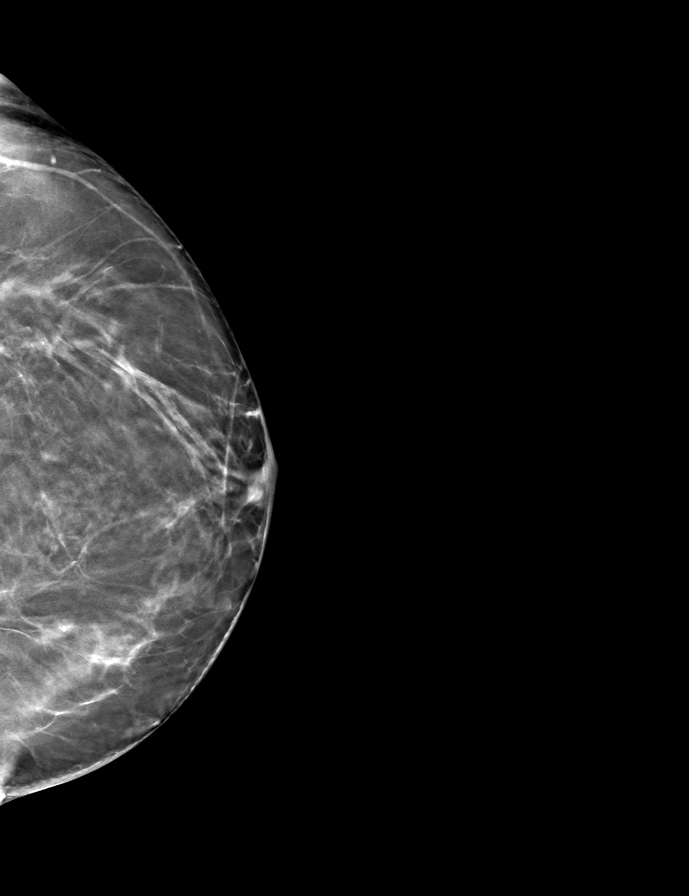
[frame 37/73]
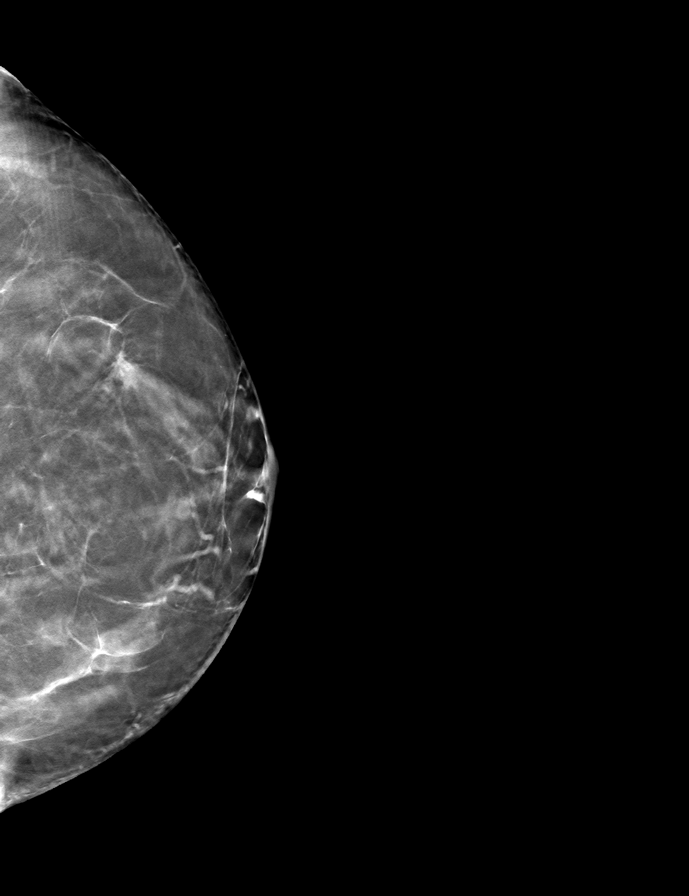

[R CC tomo · tomo slice 39/77.0]
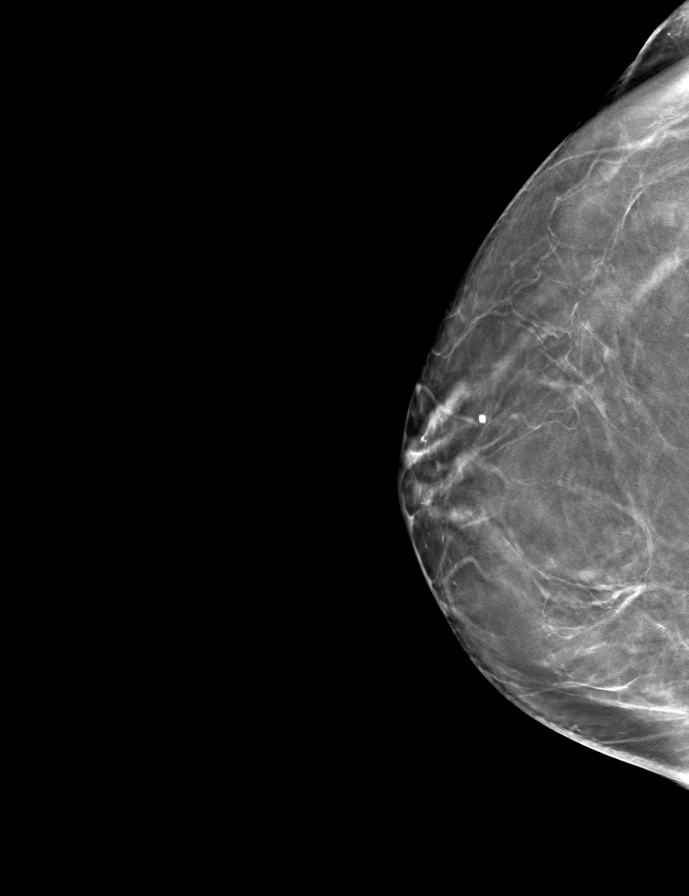

[R MLO tomo · tomo slice 34/67.0]
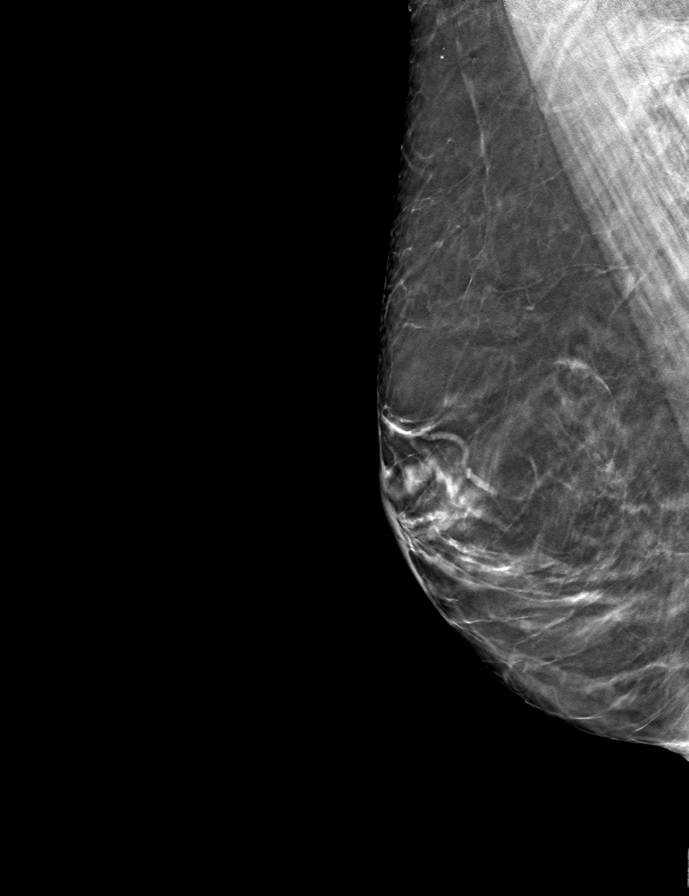

[L MLO tomo · tomo slice 37/72.0]
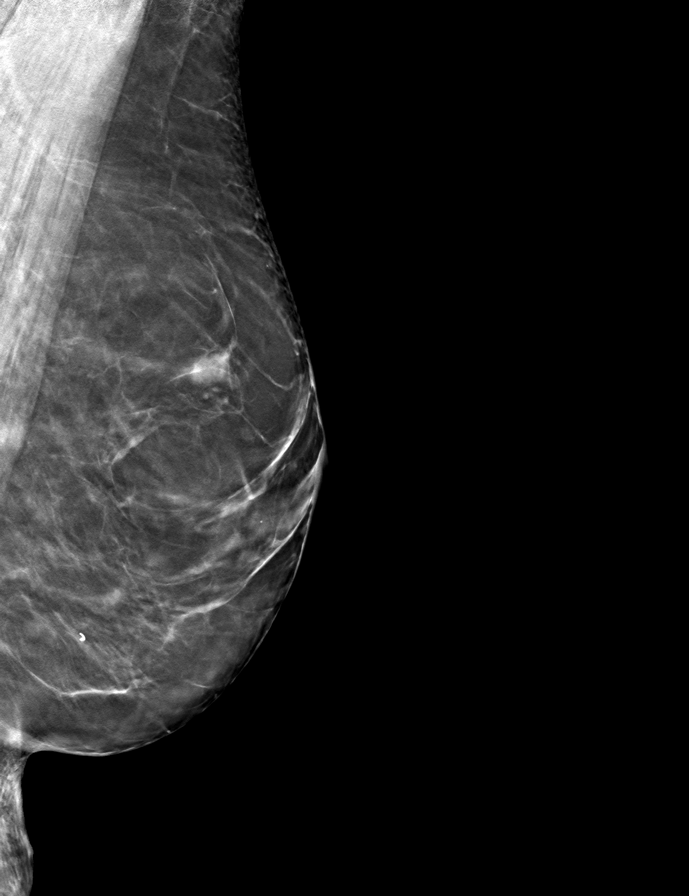

[9 of 24 positions shown; findings below may reference images not displayed]

ACR Breast Density Category b: There are scattered areas of
fibroglandular density.
FINDINGS: There are no findings suspicious for malignancy.
IMPRESSION: No mammographic evidence of malignancy. A result letter of this
screening mammogram will be mailed directly to the patient.

RECOMMENDATION:
Screening mammogram in one year. (Code:51-O-LD2)

BI-RADS CATEGORY  1: Negative.

## 2022-09-09 ENCOUNTER — Other Ambulatory Visit (HOSPITAL_COMMUNITY): Payer: Self-pay

## 2022-09-12 DIAGNOSIS — M47816 Spondylosis without myelopathy or radiculopathy, lumbar region: Secondary | ICD-10-CM | POA: Diagnosis not present

## 2022-09-21 ENCOUNTER — Other Ambulatory Visit (HOSPITAL_COMMUNITY): Payer: Self-pay

## 2022-09-21 ENCOUNTER — Other Ambulatory Visit: Payer: Self-pay | Admitting: Family Medicine

## 2022-09-21 ENCOUNTER — Telehealth: Payer: Self-pay | Admitting: Family Medicine

## 2022-09-21 DIAGNOSIS — M47816 Spondylosis without myelopathy or radiculopathy, lumbar region: Secondary | ICD-10-CM | POA: Diagnosis not present

## 2022-09-21 NOTE — Telephone Encounter (Signed)
Pt called & states she got a call from her pharmacy about ordering her Prolia shot & she wanted to know if she is due.  Please let her know

## 2022-09-26 ENCOUNTER — Other Ambulatory Visit: Payer: Self-pay

## 2022-09-26 ENCOUNTER — Other Ambulatory Visit (HOSPITAL_COMMUNITY): Payer: Self-pay

## 2022-09-26 MED ORDER — PROLIA 60 MG/ML ~~LOC~~ SOSY
60.0000 mg | PREFILLED_SYRINGE | Freq: Once | SUBCUTANEOUS | 0 refills | Status: AC
  Filled 2022-09-26: qty 1, 1d supply, fill #0
  Filled 2022-10-17: qty 1, 180d supply, fill #0

## 2022-09-26 NOTE — Telephone Encounter (Signed)
Pt is due for prolia after 10/27/22

## 2022-09-26 NOTE — Telephone Encounter (Signed)
Pt was notified that she is due for next prolia after 10/27/22. I have submitted to pharmacy to get this refilled and they will notify pt of copay if needed

## 2022-09-29 ENCOUNTER — Other Ambulatory Visit (HOSPITAL_COMMUNITY): Payer: Self-pay

## 2022-09-29 ENCOUNTER — Other Ambulatory Visit: Payer: Self-pay

## 2022-09-29 NOTE — Telephone Encounter (Addendum)
Spoke with [facility] and they will contact patient next week for payment and send it to [place]

## 2022-10-06 ENCOUNTER — Other Ambulatory Visit (HOSPITAL_COMMUNITY): Payer: Self-pay

## 2022-10-09 NOTE — Progress Notes (Unsigned)
No chief complaint on file.   [name] is a 77 y.o. female who presents for annual Medicare wellness visit and follow-up on chronic medical conditions.   She has the following concerns:  She was last seen in April with vertigo.    Osteoporosis:  She had osteopenia with elevated FRAX score in 11/2019 (T-2.4 at L forearm/radius, T-2.2 at R fem neck; FRAX score was 15.1% for major osteoporotic fracture, 4.6% for hip fracture (elevated). She was started on Actonel 150mg  monthly, which she tolerated. DEXA in 02/2022 showed osteoporosis, with T-2.6 at L forearm radius. T-2.1 at R femur neck. L hip and spine excluded. Treatment was switched to Prolia, with first injection in 03/2022. She tolerated this without side effects, and is due for next injection later this month. She is compliant with her Ca and D (taking 2 tablets equaling 1200mg , once daily).  She gets weight-bearing execises 3x/week.  Vitamin D deficiency. She continues to take a separate vitamin D3 ***update current dose 5000 IU??*** , and last D level was 44.2 in 02/2022. (Previously 25.1 in 01/2018 per GYN).    GERD and h/o esophagitis:  She was started on famotidine 40mg  BID after EGD in 06/2019 (showed reflux esophagitis, 4cm HH). She reports taking pepcid once daily and doing well. She still has some mild heartburn intermittently. Denies chest pain or dysphagia.  Hypertension follow-up: She has been taking amlodipine 5mg  daily without any side effects. She denies any edema. BP's have been running    Denies headaches, chest pain, palpitations, dizziness, edema.  BP Readings from Last 3 Encounters:  05/30/22 128/70  03/28/22 120/68  03/21/22 120/74     She had been diagnosed with global pelvic floor laxity, mild cystocele, mod anterior rectocele. She was referred for pelvic floor PT, and to urogynecologist.  She has been seeing Dr. Ashley Royalty. Diagnosed with vaginal atrophy/lichen sclerosus. Irritation/dryness improved with  topical estrogen.  She reports using estrogen sporadically, and not using clobetasol.  L hip pain:  She saw Dr. Charlann Boxer (who did hip replacement), had MRI in 4/24, and lateral left hip injection in 06/2022. (Also had L trochanteric bursa injected end of 03/2022). Referred to Dr. Drucie Ip for pain management. SEEN??  Chronic neck pain--Has seen Dr. Wynetta Emery, who had ordered MRI. She was told nothing surgical present; he recommended PT, but she had tried that 3x with Brad at [facility], so preferred not to try PT again. She recalls dry needling being very painful, and didn't help.  She goes to exercise classes at [employer] 3x/week. Has TENS unit at home, uses it every other day. Also uses a Facilities manager. These help. Wenda Over[place] also helps some, still uses this some. Pain in LBP x 2 years, pain is not all the time. The neck pain is constant. She was started on Meloxicam after MRI by Dr. Wynetta Emery, around December, didn't really notice much improvement.  She takes hydrocodone 1/2 tablet daily.  At her April visit she was complaining of fatigue, which she attributed possibly to the gabapentin that was added to help with her pain. She was only taking 100 mg daily. She was also still taking ambien nightly (1/3 of a 10mg  tablet). She hadn't noticed improvement in pain. We had discussed stopping ambien while taking gabapentin, which could allow her to take a higher dose which might have more efficacy with pain relief. UPDATE    Depression/anxiety and insomnia--sees  Ellis Savage. Continues on Lamictal, per psych.  Allergies and asthma--No longer sees allergist,  goes only when sick, and has used Symbicort prn with bronchitis.  She denies any wheezing or shortness of breath.  She has albuterol at home, to use when things flare. She uses zyrtec prn for allergies.   Immunization History  Administered Date(s) Administered   Fluad Quad(high Dose 65+) 12/08/2021   Influenza Split 11/13/2015   Influenza, High Dose  Seasonal PF 03/05/2014, 12/15/2014, 11/25/2016, 12/04/2017, 11/21/2018, 11/27/2019, 12/07/2020   Influenza,inj,quad, With Preservative 11/28/2016, 11/28/2017   Influenza-Unspecified 12/29/2012, 11/25/2016   Moderna Covid-19 Vaccine Bivalent Booster 28yrs & up 02/22/2021   PFIZER Comirnaty(Gray Top)Covid-19 Tri-Sucrose Vaccine 07/28/2020   PFIZER(Purple Top)SARS-COV-2 Vaccination 03/15/2019, 04/05/2019, 12/11/2019   Pneumococcal Conjugate-13 07/02/2014   Pneumococcal Polysaccharide-23 08/29/2007, 07/06/2015   Tdap 08/29/2007, 12/26/2017   Zoster Recombinant(Shingrix) 12/17/2018, 02/28/2019   Zoster, Live 08/29/2007   Last Pap smear: 03/2014 with GYN, normal Last mammogram: 09/2021 Last colonoscopy: 06/2019--diverticulosis, small internal hemorrhoids, no polyps. F/u not needed (prior was in 08/2014, had tubular adenoma) Last DEXA: 02/2022 T-2.6  L forearm radius. T-2.1 at R femur neck Dentist: twice yearly Ophtho: yearly Exercise:  Going to 02 gym 3x/wk ([employer]), including cardio and weights.  No longer walking or doing yoga.  Hep C screen: negative, done by GYN 04/2015  Lipid screen: Lab Results  Component Value Date   CHOL 203 (H) 08/29/2019   HDL 77 08/29/2019   LDLCALC 113 (H) 08/29/2019   TRIG 70 08/29/2019   CHOLHDL 2.6 08/29/2019   Thyroid screen: Lab Results  Component Value Date   TSH 2.520 09/10/2020      Patient Care Team: Joselyn Arrow, MD as PCP - General (Family Medicine) Pyrtle, Carie Caddy, MD as Consulting Physician (Gastroenterology) Durene Romans, MD as Consulting Physician (Orthopedic Surgery) Karie Soda, MD as Consulting Physician (General Surgery) Verner Chol, [facility] (Inactive) as Pharmacist (Pharmacist) Romualdo Bolk, MD (Inactive) as Consulting Physician (Obstetrics and Gynecology) Blima Ledger, OD (Optometry) Tyrone Schimke, MD as Consulting Physician (Ophthalmology) Cherlyn Roberts, MD as Referring Physician (Dermatology) Regal,  Kirstie Peri, DPM as Consulting Physician (Podiatry) Patricia Nettle, MD (Orthopedic Surgery) Durene Romans, MD as Consulting Physician (Orthopedic Surgery) Francena Hanly, MD as Consulting Physician (Orthopedic Surgery) Donalee Citrin, MD as Consulting Physician (Neurosurgery) Odette Fraction, MD (Inactive) as Referring Physician (Anesthesiology) Sheran Luz, MD as Consulting Physician (Physical Medicine and Rehabilitation) Callie Fielding, MD as Consulting Physician (Physical Medicine and Rehabilitation) Christia Reading, MD as Consulting Physician (Otolaryngology)  UroGYN: Dr. Ashley Royalty Dentist: Dr. Alvester Morin Ortho--Dr. Charlann Boxer (seen in the past for L hip bursitis), Dr. Rennis Chris (shoulder), Dr. Penni Bombard (foot pain) Pain doctor--Has seen Dr. Ollen Bowl, Dr. Ethelene Hal, also has seen Dr. Murray Hodgkins in the past.  Doesn't see one currently, Dr. Wells Guiles office prescribes her pain medications Psych: Ellis Savage (PA at [facility]) Allergist: Dr. Irena Cords  Physical Therapist: previously saw Beryle Flock ([facility])  Depression Screening:    09/20/2021    1:38 PM 09/07/2020    1:51 PM 09/04/2019    1:54 PM 08/27/2018   11:27 AM 07/12/2017    8:38 AM  Depression screen PHQ 2/9  Decreased Interest 0 0 0 0 0  Down, Depressed, Hopeless 0 0 0 0 0  PHQ - 2 Score 0 0 0 0 0  Altered sleeping 1      Tired, decreased energy 3      Change in appetite 0      Feeling bad or failure about yourself  0      Trouble concentrating 1  Moving slowly or fidgety/restless 0      Suicidal thoughts 0      PHQ-9 Score 5      Difficult doing work/chores Not difficult at all        Falls screen:     12/08/2021    2:26 PM 09/20/2021    1:38 PM 06/03/2021    2:32 PM 09/07/2020    1:51 PM 09/04/2019    1:54 PM  Fall Risk   Falls in the past year? 0 0 0 0 0  Number falls in past yr: 0 0 0 0   Injury with Fall? 0 0 0 0   Risk for fall due to : No Fall Risks No Fall Risks No Fall Risks No Fall Risks   Follow up Falls evaluation completed Falls  evaluation completed Falls evaluation completed Falls evaluation completed      Functional Status Survey:        End of Life Discussion:  Patient has a living will and medical power of attorney (brought in 08/2018)   PMH, PSH, SH and FH were reviewed and updated    ROS:  The patient denies anorexia, fever, weight changes, headaches,  vision changes, ear pain, sore throat, breast concerns, chest pain, palpitations, dizziness, syncope, dyspnea on exertion,  nausea, vomiting, diarrhea, abdominal pain, melena, hematochezia, hematuria, incontinence, dysuria, vaginal bleeding, discharge, odor or itch, numbness, tingling, weakness, suspicious skin lesions, depression, anxiety (controlled), abnormal bleeding or enlarged lymph nodes. +hearing loss, uses hearing aids (not wearing today) Neck pain per HPI.  Occasional reflux Slight tremor on R, noted with writing only (not with eating/drinking) Denies shortness of breath, wheezing allergies Constipation is controlled with current diet/meds   PHYSICAL EXAM:  LMP 08/15/1988 (Exact Date)   Wt Readings from Last 3 Encounters:  05/30/22 128 lb (58.1 kg)  03/28/22 129 lb 6.4 oz (58.7 kg)  03/21/22 131 lb (59.4 kg)   Well appearing, pleasant elderly female in no distress.  HEENT: conjunctiva and sclera are clear, EOMI. TM's and EAC's normal. No nasal drainage or sinus tenderness. OP is clear Neck: no lymphadenopathy, thyromegaly or mass. No spinal tenderness. WHSS. No carotid bruit.  Tender at lateral L neck muscles ***UPDATE?? Heart: regular rate and rhythm, no murmur Lungs: clear bilaterally, good air movement Back: no spinal or CVA tenderness, no SI tenderness. Tender at lumbar paraspinous muscles on the right Abdomen: soft, nontender, no mass Extremities: Normal pulses, no edema Psych: normal mood, affect, hygiene, grooming, eye contact, speech.   Neuro: alert and oriented, normal gait. Breast and pelvic exams deferred to her GYN Skin:  normal turgor.  There is some bruising and purpura L mid forearm.  UPDATE EXAM   ASSESSMENT/PLAN:   Did she pick up Prolia from pharmacy?  I believe she needs to schedule NV for 8/29 or after (6 months from the last one). Not sure why there is a "sensitive note" in last phone call about her Prolia.  Has she gotten RSV vaccine from pharmacy? (If so, check [facility] and enter), same for any additional COVID boosters.  Prevnar-20 today  RSV, high dose flu shot and COVID booster in the Fall  Discussed monthly self breast exams and yearly mammograms; at least 30 minutes of aerobic activity at least 5 days/week, weight-bearing exercise at least 2x/wk; proper sunscreen use reviewed; healthy diet, including goals of calcium and vitamin D intake and alcohol recommendations (less than or equal to 1 drink/day) reviewed; regular seatbelt use; changing batteries in smoke detectors,  carbon monoxide detectors.  Immunization recommendations discussed--continue yearly high dose flu shots.  Prevnar-20 RSV recommended in the Fall, to get from the pharmacy.  Updated bivalent COVID booster in the Fall, when available.  Colonoscopy recommendations reviewed, no longer needed.   MOST form reviewed and completed.  Full Code, Full Care.  NV for Prolia 8/29 or later.   Medicare Attestation I have personally reviewed: The patient's medical and social history Their use of alcohol, tobacco or illicit drugs Their current medications and supplements The patient's functional ability including ADLs,fall risks, home safety risks, cognitive, and hearing and visual impairment Diet and physical activities Evidence for depression or mood disorders  The patient's weight, height, BMI have been recorded in the chart.  I have made referrals, counseling, and provided education to the patient based on review of the above and I have provided the patient with a written personalized care plan for preventive services.

## 2022-10-09 NOTE — Patient Instructions (Incomplete)
  HEALTH MAINTENANCE RECOMMENDATIONS:  It is recommended that you get at least 30 minutes of aerobic exercise at least 5 days/week (for weight loss, you may need as much as 60-90 minutes). This can be any activity that gets your heart rate up. This can be divided in 10-15 minute intervals if needed, but try and build up your endurance at least once a week.  Weight bearing exercise is also recommended twice weekly.  Eat a healthy diet with lots of vegetables, fruits and fiber.  "Colorful" foods have a lot of vitamins (ie green vegetables, tomatoes, red peppers, etc).  Limit sweet tea, regular sodas and alcoholic beverages, all of which has a lot of calories and sugar.  Up to 1 alcoholic drink daily may be beneficial for women (unless trying to lose weight, watch sugars).  Drink a lot of water.  Calcium recommendations are 1200-1500 mg daily (1500 mg for postmenopausal women or women without ovaries), and vitamin D 1000 IU daily.  This should be obtained from diet and/or supplements (vitamins), and calcium should not be taken all at once, but in divided doses.  Monthly self breast exams and yearly mammograms for women over the age of 29 is recommended.  Sunscreen of at least SPF 30 should be used on all sun-exposed parts of the skin when outside between the hours of 10 am and 4 pm (not just when at beach or pool, but even with exercise, golf, tennis, and yard work!)  Use a sunscreen that says "broad spectrum" so it covers both UVA and UVB rays, and make sure to reapply every 1-2 hours.  Remember to change the batteries in your smoke detectors when changing your clock times in the spring and fall. Carbon monoxide detectors are recommended for your home.  Use your seat belt every time you are in a car, and please drive safely and not be distracted with cell phones and texting while driving.   [name] , Thank you for taking time to come for your Medicare Wellness Visit. I appreciate your ongoing  commitment to your health goals. Please review the following plan we discussed and let me know if I can assist you in the future.   This is a list of the screening recommended for you and due dates:  Health Maintenance  Topic Date Due   COVID-19 Vaccine (6 - 2023-24 season) 10/29/2021   Flu Shot  09/29/2022   Mammogram  10/12/2022   Medicare Annual Wellness Visit  10/10/2023   DTaP/Tdap/Td vaccine (3 - Td or Tdap) 12/27/2027   Pneumonia Vaccine  Completed   DEXA scan (bone density measurement)  Completed   Hepatitis C Screening  Completed   Zoster (Shingles) Vaccine  Completed   HPV Vaccine  Aged Out   Colon Cancer Screening  Discontinued   Please get the RSV vaccine from the pharmacy in the Fall. Continue to get your yearly high dose flu shots every Fall. I recommend getting the updated COVID booster when it becomes available in the Fall. You can get these vaccines at the same time (I wouldn't get all 3 together, but you can get the flu shot along with either RSV or COVID booster), or separate them by 2 weeks.  Continue yearly mammograms (due now). Next bone density test will be due in 02/2024.  Schedule a nurse visit for your next Prolia (due 8/29 or after).

## 2022-10-10 ENCOUNTER — Ambulatory Visit (INDEPENDENT_AMBULATORY_CARE_PROVIDER_SITE_OTHER): Payer: PPO | Admitting: Family Medicine

## 2022-10-10 ENCOUNTER — Encounter: Payer: Self-pay | Admitting: Family Medicine

## 2022-10-10 ENCOUNTER — Other Ambulatory Visit (HOSPITAL_COMMUNITY): Payer: Self-pay

## 2022-10-10 VITALS — BP 112/70 | HR 73 | Resp 12 | Ht 62.0 in | Wt 125.4 lb

## 2022-10-10 DIAGNOSIS — M545 Low back pain, unspecified: Secondary | ICD-10-CM | POA: Diagnosis not present

## 2022-10-10 DIAGNOSIS — M810 Age-related osteoporosis without current pathological fracture: Secondary | ICD-10-CM | POA: Diagnosis not present

## 2022-10-10 DIAGNOSIS — Z0000 Encounter for general adult medical examination without abnormal findings: Secondary | ICD-10-CM | POA: Diagnosis not present

## 2022-10-10 DIAGNOSIS — G8929 Other chronic pain: Secondary | ICD-10-CM

## 2022-10-10 DIAGNOSIS — M25552 Pain in left hip: Secondary | ICD-10-CM | POA: Diagnosis not present

## 2022-10-10 DIAGNOSIS — K2100 Gastro-esophageal reflux disease with esophagitis, without bleeding: Secondary | ICD-10-CM

## 2022-10-10 DIAGNOSIS — Z5181 Encounter for therapeutic drug level monitoring: Secondary | ICD-10-CM

## 2022-10-10 DIAGNOSIS — M542 Cervicalgia: Secondary | ICD-10-CM

## 2022-10-10 DIAGNOSIS — Z23 Encounter for immunization: Secondary | ICD-10-CM | POA: Diagnosis not present

## 2022-10-10 DIAGNOSIS — I10 Essential (primary) hypertension: Secondary | ICD-10-CM | POA: Diagnosis not present

## 2022-10-10 DIAGNOSIS — R55 Syncope and collapse: Secondary | ICD-10-CM

## 2022-10-10 MED ORDER — AMLODIPINE BESYLATE 5 MG PO TABS: 5.0000 mg | ORAL_TABLET | Freq: Every day | ORAL | 3 refills | Status: DC

## 2022-10-10 NOTE — Telephone Encounter (Signed)
I called [name] and since she is not due until After 8/29, they have her on the call list to be called this week and shipped out. it is a $100 copay, if she would like to call them I can put the information in her after visit summary to call to go ahead and pay to get it shipped to [place].

## 2022-10-11 DIAGNOSIS — M47816 Spondylosis without myelopathy or radiculopathy, lumbar region: Secondary | ICD-10-CM | POA: Diagnosis not present

## 2022-10-11 DIAGNOSIS — M7918 Myalgia, other site: Secondary | ICD-10-CM | POA: Diagnosis not present

## 2022-10-12 ENCOUNTER — Other Ambulatory Visit: Payer: Self-pay | Admitting: Internal Medicine

## 2022-10-12 ENCOUNTER — Other Ambulatory Visit (HOSPITAL_COMMUNITY): Payer: Self-pay

## 2022-10-12 DIAGNOSIS — K209 Esophagitis, unspecified without bleeding: Secondary | ICD-10-CM

## 2022-10-12 DIAGNOSIS — K219 Gastro-esophageal reflux disease without esophagitis: Secondary | ICD-10-CM

## 2022-10-14 ENCOUNTER — Other Ambulatory Visit (HOSPITAL_COMMUNITY): Payer: Self-pay

## 2022-10-17 ENCOUNTER — Other Ambulatory Visit (HOSPITAL_COMMUNITY): Payer: Self-pay

## 2022-10-17 ENCOUNTER — Other Ambulatory Visit: Payer: Self-pay

## 2022-10-17 DIAGNOSIS — J4520 Mild intermittent asthma, uncomplicated: Secondary | ICD-10-CM | POA: Diagnosis not present

## 2022-10-17 DIAGNOSIS — J310 Chronic rhinitis: Secondary | ICD-10-CM | POA: Diagnosis not present

## 2022-10-17 NOTE — Telephone Encounter (Signed)
Due to labor day weekend, pt scheduled for 9/4

## 2022-10-24 ENCOUNTER — Other Ambulatory Visit (HOSPITAL_COMMUNITY): Payer: Self-pay

## 2022-10-25 ENCOUNTER — Other Ambulatory Visit (HOSPITAL_COMMUNITY): Payer: Self-pay

## 2022-10-26 ENCOUNTER — Other Ambulatory Visit: Payer: Self-pay

## 2022-10-26 ENCOUNTER — Other Ambulatory Visit (HOSPITAL_COMMUNITY): Payer: Self-pay

## 2022-10-27 ENCOUNTER — Ambulatory Visit (INDEPENDENT_AMBULATORY_CARE_PROVIDER_SITE_OTHER): Payer: PPO | Admitting: Medical

## 2022-10-27 VITALS — BP 120/70 | HR 64 | Temp 97.3°F | Wt 127.6 lb

## 2022-10-27 DIAGNOSIS — G894 Chronic pain syndrome: Secondary | ICD-10-CM | POA: Diagnosis not present

## 2022-10-27 DIAGNOSIS — M47812 Spondylosis without myelopathy or radiculopathy, cervical region: Secondary | ICD-10-CM | POA: Diagnosis not present

## 2022-10-27 DIAGNOSIS — M4322 Fusion of spine, cervical region: Secondary | ICD-10-CM | POA: Diagnosis not present

## 2022-10-27 DIAGNOSIS — J029 Acute pharyngitis, unspecified: Secondary | ICD-10-CM

## 2022-10-27 LAB — POCT RAPID STREP A (OFFICE): Rapid Strep A Screen: NEGATIVE

## 2022-10-27 NOTE — Progress Notes (Signed)
Subjective:  [name] is a 77 y.o. female who presents for Chief Complaint  Patient presents with   Fatigue    Fatigue started Monday, covid negative today, sore throat and drainage.      Here for fatigue, sore throat.  Started 4 days ago with fatigue, malaise, but today started with sore throat.  No cough, no runny nose, no sneezing.  Some nasal congestion and runny nose.   No headaches.  No fever, no body aches or chills.  No sick contacts.  Doesn't normally have bad allergy problems this time of year.  No other aggravating or relieving factors.    No other c/o.  Past Medical History:  Diagnosis Date   Anal fissure    Anxiety    sees [facility] [name], [place])   Asthma    Colon polyps 08/2014   tubular adenoma and hyperplastic polyps (Dr. Rhea Huerta)   Cystocele with rectocele 2023   DDD (degenerative disc disease), cervical dr [name]   s/p surgery, ongoing pain   Depression    h/o hospitalization with ECT   Diverticulosis    Esophagitis    Hiatal hernia    History of insomnia    Hypertension    Internal hemorrhoids    Neck pain    Osteopenia stopped fosamax ~2006   Post-operative nausea and vomiting    Vertigo    Vitamin D deficiency borderline(28)-2010   treated by her GYN   Current Outpatient Medications on File Prior to Visit  Medication Sig Dispense Refill   amLODipine (NORVASC) 5 MG tablet Take 1 tablet (5 mg total) by mouth daily. 90 tablet 3   b complex vitamins capsule Take 1 capsule by mouth daily.     Calcium Carbonate-Vitamin D (CALCIUM PLUS VITAMIN D PO) Take 2 tablets by mouth daily.     cetirizine (ZYRTEC) 10 MG tablet Take 10 mg by mouth daily.     Cholecalciferol (VITAMIN D) 125 MCG (5000 UT) CAPS Take 1 capsule by mouth daily.     clobetasol ointment (TEMOVATE) 0.05 % Apply a small amount topically BID x 2 weeks 30 g 0   denosumab (PROLIA) 60 MG/ML SOSY injection Inject 60 mg into the skin once for 1 dose. 1 mL 0   diltiazem 2 % GEL Using your index  finger, apply a small amount of medication inside the rectum up to your first knuckle/joint twice daily x 8 weeks. 30 g 1   dimenhyDRINATE (DRAMAMINE) 50 MG tablet Take 50 mg by mouth every 8 (eight) hours as needed.     diphenhydrAMINE (BENADRYL) 25 MG tablet Take 25 mg by mouth every 6 (six) hours as needed.     estradiol (ESTRACE) 0.1 MG/GM vaginal cream      famotidine (PEPCID) 40 MG tablet TAKE 1 TABLET BY MOUTH TWICE A DAY 180 tablet 1   FIBER ADULT GUMMIES PO Take 3 each by mouth daily at 6 (six) AM.     FLOVENT HFA 110 MCG/ACT inhaler Inhale 2 puffs into the lungs 2 (two) times daily.     gabapentin (NEURONTIN) 100 MG capsule Take 1 capsule by mouth at bedtime. If tolerating, increase to 2 capsules after a week. If needed, can further increase to 3 pills at bedtime after another week (if tolerating) 90 capsule 0   HYDROcodone-acetaminophen (NORCO/VICODIN) 5-325 MG tablet Take 0.5 tablets by mouth every 6 (six) hours as needed for moderate pain.     lamoTRIgine (LAMICTAL) 150 MG tablet Take 150 mg  by mouth at bedtime.     Liniments (SALONPAS PAIN RELIEF PATCH EX) Place 1 patch onto the skin daily as needed (pain).     Multiple Vitamins-Minerals (CENTRUM SILVER 50+WOMEN PO) Take 1 tablet by mouth.     Multiple Vitamins-Minerals (HAIR SKIN AND NAILS FORMULA PO) Take 3 capsules by mouth daily.     PROAIR HFA 108 (90 Base) MCG/ACT inhaler Inhale 2 puffs into the lungs every 4 (four) hours as needed.     zolpidem (AMBIEN) 10 MG tablet Take 2.5 mg by mouth at bedtime.      No current facility-administered medications on file prior to visit.     The following portions of the patient's history were reviewed and updated as appropriate: allergies, current medications, past family history, past medical history, past social history, past surgical history and problem list.  ROS Otherwise as in subjective above  Objective: BP 120/70   Pulse 64   Temp (!) 97.3 F (36.3 C)   Wt 127 lb 9.6 oz  (57.9 kg)   LMP 08/15/1988 (Exact Date)   BMI 23.34 kg/m   General appearance: alert, no distress, well developed, well nourished HEENT: normocephalic, sclerae anicteric, conjunctiva pink and moist, TMs pearly, nares patent, no discharge or erythema, pharynx normal Oral cavity: MMM, no lesions Neck: supple, no lymphadenopathy, no thyromegaly, no masses Heart: RRR, normal S1, S2, no murmurs Lungs: CTA bilaterally, no wheezes, rhonchi, or rales Pulses: 2+ radial pulses, 2+ pedal pulses, normal cap refill Ext: no edema   Assessment: Encounter Diagnosis  Name Primary?   Sore throat Yes     Plan: Covid negative today at home, strep negative today here.  Exam unremarkable  Discussed symptoms, concerns.  Advised rest, hydration, tylenol if needed, salt water gargles.   Consider re-test covid if symptoms worsen  Call /recheck if worse or not improving or if new symptoms in the next few days  [name] was seen today for fatigue.  Diagnoses and all orders for this visit:  Sore throat -     Rapid Strep A   Follow up: prn

## 2022-11-02 ENCOUNTER — Telehealth: Payer: Self-pay | Admitting: Family Medicine

## 2022-11-02 ENCOUNTER — Ambulatory Visit: Payer: PPO | Admitting: Dermatology

## 2022-11-02 ENCOUNTER — Other Ambulatory Visit (INDEPENDENT_AMBULATORY_CARE_PROVIDER_SITE_OTHER): Payer: PPO

## 2022-11-02 DIAGNOSIS — M810 Age-related osteoporosis without current pathological fracture: Secondary | ICD-10-CM

## 2022-11-02 MED ORDER — DENOSUMAB 60 MG/ML ~~LOC~~ SOSY
60.0000 mg | PREFILLED_SYRINGE | Freq: Once | SUBCUTANEOUS | Status: AC
  Administered 2022-11-02: 60 mg via SUBCUTANEOUS

## 2022-11-02 NOTE — Telephone Encounter (Signed)
Pt called and is requesting a referral for to go to [facility] rheumatology, states she thanks is the next step with the pain she is having,  There fax number is [phone] Informed pt that you was [place] the [place] until [place] week

## 2022-11-08 NOTE — Telephone Encounter (Signed)
I'm not sure how helpful it will be, but if she wants a consult, that's fine with me. They can submit the last physical with the referral. She has seen lots of orthos, spine docs, pain docs, I don't believe she has seen a rheumatologist in the past. They often like some labs done first, that have never been done by [place] (ANA, sed rate, CRP, RF).  She can just let them do labs at her visit (whatever they feel is appropriate), or we can do these screening labs first.  I suspect that they will be fairly normal, and that she doesn't have rheumatoid arthritis or any other auto-immune disease. If she wants these done first, okay to order (order the ANA with reflex), using the pain codes used at her recent physical.

## 2022-11-10 ENCOUNTER — Other Ambulatory Visit: Payer: Self-pay | Admitting: *Deleted

## 2022-11-10 DIAGNOSIS — F331 Major depressive disorder, recurrent, moderate: Secondary | ICD-10-CM | POA: Diagnosis not present

## 2022-11-10 DIAGNOSIS — M25552 Pain in left hip: Secondary | ICD-10-CM

## 2022-11-10 DIAGNOSIS — M545 Low back pain, unspecified: Secondary | ICD-10-CM

## 2022-11-10 DIAGNOSIS — G8929 Other chronic pain: Secondary | ICD-10-CM

## 2022-11-10 DIAGNOSIS — Z5181 Encounter for therapeutic drug level monitoring: Secondary | ICD-10-CM | POA: Diagnosis not present

## 2022-11-10 DIAGNOSIS — F3342 Major depressive disorder, recurrent, in full remission: Secondary | ICD-10-CM | POA: Diagnosis not present

## 2022-11-10 NOTE — Telephone Encounter (Signed)
Referral sent.   Patient advised.

## 2022-11-17 ENCOUNTER — Ambulatory Visit (INDEPENDENT_AMBULATORY_CARE_PROVIDER_SITE_OTHER): Payer: PPO | Admitting: Medical

## 2022-11-17 ENCOUNTER — Other Ambulatory Visit: Payer: Self-pay | Admitting: Family Medicine

## 2022-11-17 VITALS — BP 122/78 | HR 95 | Temp 97.8°F | Wt 127.8 lb

## 2022-11-17 DIAGNOSIS — J029 Acute pharyngitis, unspecified: Secondary | ICD-10-CM | POA: Diagnosis not present

## 2022-11-17 DIAGNOSIS — J988 Other specified respiratory disorders: Secondary | ICD-10-CM

## 2022-11-17 DIAGNOSIS — Z1231 Encounter for screening mammogram for malignant neoplasm of breast: Secondary | ICD-10-CM

## 2022-11-17 MED ORDER — AMOXICILLIN 875 MG PO TABS
875.0000 mg | ORAL_TABLET | Freq: Two times a day (BID) | ORAL | 0 refills | Status: AC
Start: 2022-11-17 — End: 2022-11-27

## 2022-11-17 MED ORDER — PROAIR HFA 108 (90 BASE) MCG/ACT IN AERS
2.0000 | INHALATION_SPRAY | RESPIRATORY_TRACT | 1 refills | Status: DC | PRN
Start: 2022-11-17 — End: 2023-01-06

## 2022-11-17 NOTE — Patient Instructions (Signed)
Symptoms and exam findings suggest respiratory tract infection.  Given your timeframe and prior evaluation a few weeks ago, begin the following recommendations since you are not fully resolved and have some more sore throat this week.  Recommendations: Continue salt water gargles and add nasal saline flush if you are not doing nasal saline already Begin amoxicillin twice daily for 10 days Use your inhaler as needed, refill sent today I would use Mucinex DM for the next few days to help with cough and congestion.  Do this instead of multiple other remedies You can use Tylenol for mild pain. For worse pain or cough, keep in mind you have hydrocodone which is used for pain in general.  This can help suppress cough but I would do it no more than twice daily.  Caution with sedation of hydrocodone Drink plenty of water throughout the day If not much improved by Tuesday of next week let me know

## 2022-11-17 NOTE — Progress Notes (Signed)
Subjective:  [name] is a 77 y.o. female who presents for Chief Complaint  Patient presents with   throat pain    Throat pain since Monday. Very painful. Had this back in end of August,     She notes sore throat all week since Monday 4 days ago, chest congested again, tight, some whezing, some cough.   Does feel good.   Was seen end of August for illness, got a litle better, but never fully resolved.   Tired.    Has some nose stopped up.  Feels discomfrt in head to neck.    No fever, no NVD, no runny nose.   No body aches or chills.  Did covid test at home today, negative.  Using flonase, zyrtec, gargles with salt water.  No other aggravating or relieving factors.    No other c/o.  Past Medical History:  Diagnosis Date   Anal fissure    Anxiety    sees [facility] [place], [place])   Asthma    Colon polyps 08/2014   tubular adenoma and hyperplastic polyps (Dr. Rhea Belton)   Cystocele with rectocele 2023   DDD (degenerative disc disease), cervical dr Noel Gerold   s/p surgery, ongoing pain   Depression    h/o hospitalization with ECT   Diverticulosis    Esophagitis    Hiatal hernia    History of insomnia    Hypertension    Internal hemorrhoids    Neck pain    Osteopenia stopped fosamax ~2006   Post-operative nausea and vomiting    Vertigo    Vitamin D deficiency borderline(28)-2010   treated by her GYN   Current Outpatient Medications on File Prior to Visit  Medication Sig Dispense Refill   amLODipine (NORVASC) 5 MG tablet Take 1 tablet (5 mg total) by mouth daily. 90 tablet 3   b complex vitamins capsule Take 1 capsule by mouth daily.     Calcium Carbonate-Vitamin D (CALCIUM PLUS VITAMIN D PO) Take 2 tablets by mouth daily.     cetirizine (ZYRTEC) 10 MG tablet Take 10 mg by mouth daily.     Cholecalciferol (VITAMIN D) 125 MCG (5000 UT) CAPS Take 1 capsule by mouth daily.     clobetasol ointment (TEMOVATE) 0.05 % Apply a small amount topically BID x 2 weeks 30 g 0   diltiazem 2  % GEL Using your index finger, apply a small amount of medication inside the rectum up to your first knuckle/joint twice daily x 8 weeks. 30 g 1   dimenhyDRINATE (DRAMAMINE) 50 MG tablet Take 50 mg by mouth every 8 (eight) hours as needed.     diphenhydrAMINE (BENADRYL) 25 MG tablet Take 25 mg by mouth every 6 (six) hours as needed.     estradiol (ESTRACE) 0.1 MG/GM vaginal cream      famotidine (PEPCID) 40 MG tablet TAKE 1 TABLET BY MOUTH TWICE A DAY 180 tablet 1   FIBER ADULT GUMMIES PO Take 3 each by mouth daily at 6 (six) AM.     FLOVENT HFA 110 MCG/ACT inhaler Inhale 2 puffs into the lungs 2 (two) times daily.     HYDROcodone-acetaminophen (NORCO/VICODIN) 5-325 MG tablet Take 0.5 tablets by mouth every 6 (six) hours as needed for moderate pain.     lamoTRIgine (LAMICTAL) 150 MG tablet Take 150 mg by mouth at bedtime.     Liniments (SALONPAS PAIN RELIEF PATCH EX) Place 1 patch onto the skin daily as needed (pain).  Multiple Vitamins-Minerals (CENTRUM SILVER 50+WOMEN PO) Take 1 tablet by mouth.     Multiple Vitamins-Minerals (HAIR SKIN AND NAILS FORMULA PO) Take 3 capsules by mouth daily.     zolpidem (AMBIEN) 10 MG tablet Take 2.5 mg by mouth at bedtime.      No current facility-administered medications on file prior to visit.     The following portions of the patient's history were reviewed and updated as appropriate: allergies, current medications, past family history, past medical history, past social history, past surgical history and problem list.  ROS Otherwise as in subjective above  Objective: BP 122/78   Pulse 95   Temp 97.8 F (36.6 C)   Wt 127 lb 12.8 oz (58 kg)   LMP 08/15/1988 (Exact Date)   BMI 23.37 kg/m   General appearance: alert, no distress, well developed, well nourished HEENT: normocephalic, sclerae anicteric, conjunctiva pink and moist, TMs flat, nares with some mucoid discharge , +erythema, pharynx with mild eryteham and post nasal drainage Oral  cavity: MMM, no lesions Neck: supple, shody anterior nodes, no thyromegaly, no masses Lungs: somewhat coarse sounds, no wheezes, rhonchi, or rales   Assessment: Encounter Diagnoses  Name Primary?   Sore throat Yes   Respiratory tract infection       Plan: Symptoms and exam findings suggest respiratory tract infection.  Given your timeframe and prior evaluation a few weeks ago, begin the following recommendations since you are not fully resolved and have some more sore throat this week.  Recommendations: Continue salt water gargles and add nasal saline flush if you are not doing nasal saline already Begin amoxicillin twice daily for 10 days Use your inhaler as needed, refill sent today I would use Mucinex DM for the next few days to help with cough and congestion.  Do this instead of multiple other remedies You can use Tylenol for mild pain. For worse pain or cough, keep in mind you have hydrocodone which is used for pain in general.  This can help suppress cough but I would do it no more than twice daily.  Caution with sedation of hydrocodone Drink plenty of water throughout the day If not much improved by Tuesday of next week let me know    [name] was seen today for throat pain.  Diagnoses and all orders for this visit:  Sore throat  Respiratory tract infection  Other orders -     amoxicillin (AMOXIL) 875 MG tablet; Take 1 tablet (875 mg total) by mouth 2 (two) times daily for 10 days. -     PROAIR HFA 108 (90 Base) MCG/ACT inhaler; Inhale 2 puffs into the lungs every 4 (four) hours as needed.    Follow up: prn

## 2022-11-19 ENCOUNTER — Encounter (HOSPITAL_COMMUNITY): Payer: Self-pay

## 2022-11-23 DIAGNOSIS — M542 Cervicalgia: Secondary | ICD-10-CM | POA: Diagnosis not present

## 2022-11-23 DIAGNOSIS — M546 Pain in thoracic spine: Secondary | ICD-10-CM | POA: Diagnosis not present

## 2022-11-29 DIAGNOSIS — M7918 Myalgia, other site: Secondary | ICD-10-CM | POA: Diagnosis not present

## 2022-11-29 DIAGNOSIS — M1991 Primary osteoarthritis, unspecified site: Secondary | ICD-10-CM | POA: Diagnosis not present

## 2022-11-29 DIAGNOSIS — Z6823 Body mass index (BMI) 23.0-23.9, adult: Secondary | ICD-10-CM | POA: Diagnosis not present

## 2022-11-29 DIAGNOSIS — M542 Cervicalgia: Secondary | ICD-10-CM | POA: Diagnosis not present

## 2022-11-29 DIAGNOSIS — M5030 Other cervical disc degeneration, unspecified cervical region: Secondary | ICD-10-CM | POA: Diagnosis not present

## 2022-11-29 DIAGNOSIS — M546 Pain in thoracic spine: Secondary | ICD-10-CM | POA: Diagnosis not present

## 2022-12-01 DIAGNOSIS — M546 Pain in thoracic spine: Secondary | ICD-10-CM | POA: Diagnosis not present

## 2022-12-01 DIAGNOSIS — M542 Cervicalgia: Secondary | ICD-10-CM | POA: Diagnosis not present

## 2022-12-01 DIAGNOSIS — J029 Acute pharyngitis, unspecified: Secondary | ICD-10-CM | POA: Diagnosis not present

## 2022-12-02 DIAGNOSIS — F3342 Major depressive disorder, recurrent, in full remission: Secondary | ICD-10-CM | POA: Diagnosis not present

## 2022-12-05 DIAGNOSIS — M542 Cervicalgia: Secondary | ICD-10-CM | POA: Diagnosis not present

## 2022-12-05 DIAGNOSIS — M546 Pain in thoracic spine: Secondary | ICD-10-CM | POA: Diagnosis not present

## 2022-12-07 DIAGNOSIS — M542 Cervicalgia: Secondary | ICD-10-CM | POA: Diagnosis not present

## 2022-12-07 DIAGNOSIS — M546 Pain in thoracic spine: Secondary | ICD-10-CM | POA: Diagnosis not present

## 2022-12-12 DIAGNOSIS — M542 Cervicalgia: Secondary | ICD-10-CM | POA: Diagnosis not present

## 2022-12-12 DIAGNOSIS — M546 Pain in thoracic spine: Secondary | ICD-10-CM | POA: Diagnosis not present

## 2022-12-13 ENCOUNTER — Ambulatory Visit: Payer: PPO

## 2022-12-15 DIAGNOSIS — M546 Pain in thoracic spine: Secondary | ICD-10-CM | POA: Diagnosis not present

## 2022-12-15 DIAGNOSIS — M542 Cervicalgia: Secondary | ICD-10-CM | POA: Diagnosis not present

## 2022-12-19 ENCOUNTER — Telehealth: Payer: Self-pay | Admitting: Internal Medicine

## 2022-12-19 NOTE — Telephone Encounter (Signed)
Pt has h/o rectal prolapse surgery. Reports she is having discomfort in her rectum when she tries to has a BM. Pt thinks she may have a hemorrhoid, requesting to be seen. Pt scheduled to see Quentin Mulling PA 12/26/22 at 8:30am. Pt aware of appt. Also states she saw her ENT due to a sore throat on the right side of her throat. ENT told her she has an ulcer at the back of her throat that is reflux related. ENT placed her on meds for this though.

## 2022-12-19 NOTE — Telephone Encounter (Signed)
Patient called states she has been experiencing rectal pain and Gerd. Please advise.

## 2022-12-20 DIAGNOSIS — M546 Pain in thoracic spine: Secondary | ICD-10-CM | POA: Diagnosis not present

## 2022-12-20 DIAGNOSIS — M542 Cervicalgia: Secondary | ICD-10-CM | POA: Diagnosis not present

## 2022-12-21 ENCOUNTER — Ambulatory Visit
Admission: RE | Admit: 2022-12-21 | Discharge: 2022-12-21 | Disposition: A | Discharge status: Home / Self Care | Payer: PPO | Source: Ambulatory Visit | Attending: Family Medicine | Admitting: Family Medicine

## 2022-12-21 DIAGNOSIS — Z1231 Encounter for screening mammogram for malignant neoplasm of breast: Secondary | ICD-10-CM | POA: Diagnosis not present

## 2022-12-22 ENCOUNTER — Ambulatory Visit: Payer: PPO | Admitting: Podiatry

## 2022-12-22 ENCOUNTER — Ambulatory Visit (INDEPENDENT_AMBULATORY_CARE_PROVIDER_SITE_OTHER): Payer: PPO

## 2022-12-22 ENCOUNTER — Encounter: Payer: Self-pay | Admitting: Podiatry

## 2022-12-22 DIAGNOSIS — M778 Other enthesopathies, not elsewhere classified: Secondary | ICD-10-CM

## 2022-12-22 DIAGNOSIS — M546 Pain in thoracic spine: Secondary | ICD-10-CM | POA: Diagnosis not present

## 2022-12-22 DIAGNOSIS — M542 Cervicalgia: Secondary | ICD-10-CM | POA: Diagnosis not present

## 2022-12-22 MED ORDER — TRIAMCINOLONE ACETONIDE 10 MG/ML IJ SUSP
10.0000 mg | Freq: Once | INTRAMUSCULAR | Status: AC
  Administered 2022-12-22: 10 mg via INTRA_ARTICULAR

## 2022-12-22 NOTE — Progress Notes (Signed)
12/26/2022 [name] [id number] 22-Jul-1945  Referring provider: Joselyn Arrow, MD Primary GI doctor: Dr. Rhea Belton  ASSESSMENT AND PLAN:   Anal Fissure Rectal pain and bleeding with bowel movements, likely secondary to constipation and hard stools. Noted a visible fissure on examination. -Apply Diltiazem cream and Calmol 4 suppositories twice daily for two months. -Consider pelvic floor physical therapy for improved bowel movement coordination post surgery - can consider surgical referral if not healing.  Chronic Constipation Longstanding issue, exacerbated by hydrocodone and recent addition of Carafate (sucralfate). Stool softeners and laxatives have been minimally effective. - Hard large stool volume on exam, too tender for disimpaction this visit -Initiate bowel purge with Trilyte over two days and Fleet's enemas. -Start daily Miralax, up to twice daily as needed. -Consider Linzess if Miralax is ineffective.  Perianal Yeast Infection Noted on examination, likely contributing to itching and discomfort. -Apply prescribed antifungal cream.  Reflux On Carafate (sucralfate) for a recent diagnosis of a small ulcer. Not currently on Pepcid due to misunderstanding of instructions. No dysphagia, no weight loss.  -Resume Pepcid for reflux management.  Follow-up in 2-3 months to assess response to treatment.  Call sooner if symptoms worsen or do not improve  Patient Care Team: Joselyn Arrow, MD as PCP - General (Family Medicine) Pyrtle, Carie Caddy, MD as Consulting Physician (Gastroenterology) Durene Romans, MD as Consulting Physician (Orthopedic Surgery) Karie Soda, MD as Consulting Physician (General Surgery) Verner Chol, [facility] (Inactive) as Pharmacist (Pharmacist) Romualdo Bolk, MD (Inactive) as Consulting Physician (Obstetrics and Gynecology) Blima Ledger, OD (Optometry) Tyrone Schimke, MD as Consulting Physician (Ophthalmology) Cherlyn Roberts, MD as Referring  Physician (Dermatology) Regal, Kirstie Peri, DPM as Consulting Physician (Podiatry) Patricia Nettle, MD (Orthopedic Surgery) Durene Romans, MD as Consulting Physician (Orthopedic Surgery) Francena Hanly, MD as Consulting Physician (Orthopedic Surgery) Donalee Citrin, MD as Consulting Physician (Neurosurgery) Odette Fraction, MD (Inactive) as Referring Physician (Anesthesiology) Sheran Luz, MD as Consulting Physician (Physical Medicine and Rehabilitation) Callie Fielding, MD as Consulting Physician (Physical Medicine and Rehabilitation) Christia Reading, MD as Consulting Physician (Otolaryngology)  HISTORY OF PRESENT ILLNESS: 77 y.o. female with a past medical history of hypertension, internal hemorrhoids, esophagitis, diverticulosis, anal fissure, personal history of colon polyps and others listed below presents for evaluation of rectal discomfort.   06/2019 colonoscopy for follow-up of adenomatous colon polyps with multiple sigmoid and left colon diverticuli and small internal hemorrhoids.  04/04/2022 surgery at [facility] for stage II rectocele with obstructive defecation status post enterocele repair. 12/19/2022 patient called complaining of rectal pain. Patient also discussed recently seeing ear nose and throat for pharyngitis was told it was reflux related and placed on medication.  10/10/2022 CBC without leukocytosis no anemia kidney and liver unremarkable  Discussed the use of AI scribe software for clinical note transcription with the patient, who gave verbal consent to proceed.  The patient, with a history of enterocele, and rectocele s/p repair presents with worsening constipation and rectal pain. She reports a long-standing issue with constipation, which has been exacerbated by the recent addition of sulcrafate for a sore throat. The patient also takes hydrocodone for chronic pain, which she has been on for several years. She describes difficulty with bowel movements, often going two days without one,  and straining when she does occur.  The patient also reports rectal pain during bowel movements, which she attributes to a hemorrhoid. She describes a sensation of tingling and itching, and has noticed bright red blood on the toilet paper after  a severe bowel movement. She denies any abdominal pain or weight loss.  The patient has tried various over-the-counter remedies for her constipation, including stool softeners and laxatives, with varying degrees of success. She has also tried a powdered drink, which she finds unpalatable, it is orange. She expresses concern about the safety of long-term laxative use, as advised by her primary care physician.  The patient's last colonoscopy was in 2021, which showed diverticula and hemorrhoids. She also had surgery for her enterocele, and rectocele in February of the same year. She reports that her constipation has worsened since the surgery.  She  reports that she has quit smoking. Her smoking use included cigarettes. She has never used smokeless tobacco. She reports current alcohol use. She reports that she does not use drugs.  RELEVANT LABS AND IMAGING:  Results   DIAGNOSTIC Colonoscopy: Diverticula and hemorrhoids (2021)      CBC    Component Value Date/Time   WBC 4.6 10/10/2022 0938   WBC 6.4 08/27/2018 1514   RBC 3.97 10/10/2022 0938   RBC 4.22 08/27/2018 1514   HGB 12.1 10/10/2022 0938   HCT 35.8 10/10/2022 0938   PLT 244 10/10/2022 0938   MCV 90 10/10/2022 0938   MCH 30.5 10/10/2022 0938   MCH 29.9 08/27/2018 1514   MCHC 33.8 10/10/2022 0938   MCHC 32.3 08/27/2018 1514   RDW 12.6 10/10/2022 0938   LYMPHSABS 1.3 10/10/2022 0938   MONOABS 400 05/23/2016 1110   EOSABS 0.1 10/10/2022 0938   BASOSABS 0.1 10/10/2022 0938   Recent Labs    10/10/22 0938  HGB 12.1    CMP     Component Value Date/Time   NA 138 10/10/2022 0938   K 4.4 10/10/2022 0938   CL 100 10/10/2022 0938   CO2 25 10/10/2022 0938   GLUCOSE 91 10/10/2022 0938    GLUCOSE 105 (H) 08/27/2018 1514   BUN 12 10/10/2022 0938   CREATININE 0.59 10/10/2022 0938   CREATININE 0.65 05/23/2016 1110   CALCIUM 8.6 (L) 10/10/2022 0938   PROT 6.5 10/10/2022 0938   ALBUMIN 4.7 10/10/2022 0938   AST 19 10/10/2022 0938   ALT 15 10/10/2022 0938   ALKPHOS 73 10/10/2022 0938   BILITOT <0.2 10/10/2022 0938   GFRNONAA 90 12/18/2019 0000   GFRAA 104 12/18/2019 0000      Latest Ref Rng & Units 10/10/2022    9:38 AM 06/03/2021    3:41 PM 09/10/2020    9:10 AM  Hepatic Function  Total Protein 6.0 - 8.5 g/dL 6.5  6.4  6.5   Albumin 3.8 - 4.8 g/dL 4.7  4.7  4.6   AST 0 - 40 IU/L 19  19  22    ALT 0 - 32 IU/L 15  19  19    Alk Phosphatase 44 - 121 IU/L 73  82  81   Total Bilirubin 0.0 - 1.2 mg/dL <6.3  0.2  0.3       Current Medications:   Current Outpatient Medications (Endocrine & Metabolic):    denosumab (PROLIA) 60 MG/ML SOSY injection, Inject 60 mg into the skin every 6 (six) months.  Current Outpatient Medications (Cardiovascular):    amLODipine (NORVASC) 5 MG tablet, Take 1 tablet (5 mg total) by mouth daily.   diltiazem 2 % GEL*, Using your index finger, apply a small amount of medication inside the rectum up to your first knuckle/joint twice daily x 8 weeks. (Patient not taking: Reported on 12/26/2022)  Current Outpatient Medications (Respiratory):  cetirizine (ZYRTEC) 10 MG tablet, Take 10 mg by mouth daily.   FLOVENT HFA 110 MCG/ACT inhaler, Inhale 2 puffs into the lungs 2 (two) times daily.   PROAIR HFA 108 (90 Base) MCG/ACT inhaler, Inhale 2 puffs into the lungs every 4 (four) hours as needed.   benzonatate (TESSALON) 100 MG capsule, Take 100 mg by mouth 2 (two) times daily as needed. (Patient not taking: Reported on 12/26/2022)  Current Outpatient Medications (Analgesics):    HYDROcodone-acetaminophen (NORCO/VICODIN) 5-325 MG tablet, Take 0.5 tablets by mouth every 6 (six) hours as needed for moderate pain.   Current Outpatient Medications  (Other):    AMBULATORY NON FORMULARY MEDICATION, Medication Name: Diltiazem 2%/Lidocaine 2%   Using your index finger apply a small amount of medication inside the anal opening and to the external anal area twice daily x 6 weeks.   b complex vitamins capsule, Take 1 capsule by mouth daily.   Calcium Carbonate-Vitamin D (CALCIUM PLUS VITAMIN D PO), Take 2 tablets by mouth daily.   Cholecalciferol (VITAMIN D) 125 MCG (5000 UT) CAPS, Take 1 capsule by mouth daily.   clobetasol ointment (TEMOVATE) 0.05 %, Apply a small amount topically BID x 2 weeks   estradiol (ESTRACE) 0.1 MG/GM vaginal cream,    famotidine (PEPCID) 40 MG tablet, TAKE 1 TABLET BY MOUTH TWICE A DAY   FIBER ADULT GUMMIES PO, Take 3 each by mouth daily at 6 (six) AM.   lamoTRIgine (LAMICTAL) 150 MG tablet, Take 150 mg by mouth at bedtime.   Liniments (SALONPAS PAIN RELIEF PATCH EX), Place 1 patch onto the skin daily as needed (pain).   Multiple Vitamins-Minerals (CENTRUM SILVER 50+WOMEN PO), Take 1 tablet by mouth.   Multiple Vitamins-Minerals (HAIR SKIN AND NAILS FORMULA PO), Take 3 capsules by mouth daily.   nystatin cream (MYCOSTATIN), Apply 1 Application topically 2 (two) times daily. On rectum   polyethylene glycol-electrolytes (NULYTELY) 420 g solution, Take 4,000 mLs by mouth once for 1 dose. Follow instructions that are printed out for you.   sucralfate (CARAFATE) 1 g tablet, Take 1 tablet by mouth 4 (four) times daily.   zolpidem (AMBIEN) 10 MG tablet, Take 2.5 mg by mouth at bedtime.    diltiazem 2 % GEL*, Using your index finger, apply a small amount of medication inside the rectum up to your first knuckle/joint twice daily x 8 weeks. (Patient not taking: Reported on 12/26/2022) * These medications belong to multiple therapeutic classes and are listed under each applicable group.  Medical History:  Past Medical History:  Diagnosis Date   Anal fissure    Anxiety    sees [facility] [place], [place])   Asthma    Colon  polyps 08/2014   tubular adenoma and hyperplastic polyps (Dr. Rhea Belton)   Cystocele with rectocele 2023   DDD (degenerative disc disease), cervical dr Noel Gerold   s/p surgery, ongoing pain   Depression    h/o hospitalization with ECT   Diverticulosis    Esophagitis    Hiatal hernia    History of insomnia    Hypertension    Internal hemorrhoids    Neck pain    Osteopenia stopped fosamax ~2006   Post-operative nausea and vomiting    Vertigo    Vitamin D deficiency borderline(28)-2010   treated by her GYN   Allergies:  Allergies  Allergen Reactions   Codeine Other (See Comments)    Other Reaction(s): "goes crazy"  "goes crazy"   Cymbalta [Duloxetine Hcl]  Abdominal pain   Nitrofurantoin [Macrodantin]    Sulfamethoxazole Hives   Cefdinir Rash     Surgical History:  She  has a past surgical history that includes Neck surgery (02/2010([facility])1/03,2005([facility])); LASIK; Nasal septoplasty w/ turbinoplasty (1986); Knee arthroscopy (Right); Total hip arthroplasty (Left, 08/2007); Hemorrhoid surgery (2006, 12/2014); Reduction mammaplasty (Bilateral, 07/24/2008); Cataract extraction, bilateral (Bilateral, Sept/October 2019); Total shoulder arthroplasty (Left, 08/30/2018); Tonsillectomy; Hemorrhoid banding (12/2020); and Rectocele repair (04/04/2022). Family History:  Her family history includes Arthritis in her mother; Breast cancer in her cousin; Depression in her brother and mother; Diabetes in her paternal uncle; Heart attack in her father; Heart disease (age of onset: 32) in her father; Pernicious anemia in her mother.  REVIEW OF SYSTEMS  : All other systems reviewed and negative except where noted in the History of Present Illness.  PHYSICAL EXAM: BP (!) 152/70   Pulse 64   Ht 5' 0.5" (1.537 m) Comment: measured w/o shoes  Wt 126 lb (57.2 kg)   LMP 08/15/1988 (Exact Date)   BMI 24.20 kg/m  General Appearance: Well nourished, in no apparent distress. Head:   Normocephalic and  atraumatic. Eyes:  sclerae anicteric,conjunctive pink  Respiratory: Respiratory effort normal, BS equal bilaterally without rales, rhonchi, wheezing. Cardio: RRR with no MRGs. Peripheral pulses intact.  Abdomen: Soft,  Non-distended ,active bowel sounds. No tenderness . Without guarding and Without rebound. No masses. RECTAL: Fissure identified, tender upon examination. Presence of hemorrhoid noted, tender upon palpation. Significant amount of hard stool present. SKIN: Rash present, suggestive of yeast infection.  Musculoskeletal: Full ROM, Normal gait. Without edema. Skin:  Dry and intact without significant lesions or rashes Neuro: Alert and  oriented x4;  No focal deficits. Psych:  Cooperative. Normal mood and affect.   Physical Exam         Doree Albee, PA-C 9:29 AM

## 2022-12-23 NOTE — Progress Notes (Signed)
Subjective:   Patient ID: [name], female   DOB: 77 y.o.   MRN: [id number]   HPI Patient's not been seen for several years complains of pain on the top of the left foot does not remember specific injury   ROS      Objective:  Physical Exam  Neurovascular status intact inflammation of the lesser MPJ left fluid buildup around the second that is painful     Assessment:  Inflammatory capsulitis hopefully acute     Plan:  H&P x-ray reviewed today I went ahead did sterile prep did a periarticular injection around the MPJ 3 mg dexamethasone Kenalog 5 mg Xylocaine advised on rigid bottom shoes reappoint as needed  X-rays indicate no signs of stress fracture appears to be inflammatory

## 2022-12-26 ENCOUNTER — Ambulatory Visit: Payer: PPO | Admitting: Physician Assistant

## 2022-12-26 ENCOUNTER — Encounter: Payer: Self-pay | Admitting: Physician Assistant

## 2022-12-26 VITALS — BP 152/70 | HR 64 | Ht 60.5 in | Wt 126.0 lb

## 2022-12-26 DIAGNOSIS — K6289 Other specified diseases of anus and rectum: Secondary | ICD-10-CM | POA: Diagnosis not present

## 2022-12-26 DIAGNOSIS — K5903 Drug induced constipation: Secondary | ICD-10-CM | POA: Diagnosis not present

## 2022-12-26 DIAGNOSIS — K625 Hemorrhage of anus and rectum: Secondary | ICD-10-CM | POA: Diagnosis not present

## 2022-12-26 MED ORDER — AMBULATORY NON FORMULARY MEDICATION: 1 refills | Status: DC

## 2022-12-26 MED ORDER — PEG 3350-KCL-NA BICARB-NACL 420 G PO SOLR: 4000.0000 mL | Freq: Once | ORAL | 0 refills | Status: AC

## 2022-12-26 MED ORDER — NYSTATIN 100000 UNIT/GM EX CREA: 1.0000 | TOPICAL_CREAM | Freq: Two times a day (BID) | CUTANEOUS | 1 refills | Status: DC

## 2022-12-26 NOTE — Patient Instructions (Addendum)
Anal Fissure, Adult  A fissure is a linear defect in the anal mucosa, symptoms include burning, itching, discomfort especially with a bowel movement with associated rectal bleeding.  Risk factors include low fiber diet, chronic constipation and straining. Anal fissures can take a very long time to heal so this will be a 2 to 100-month process.  Treatment for a fissure includes:  -decreasing time in the toilet should not be more than 5 minutes -adding fiber supplement such as Benefiber or Citrucel -increasing water. -There is a lubricating suppository over-the-counter called Calmol-4 you can get from [facility] or from your pharmacy (may have to order) that I want to do twice daily morning and evening for 8 weeks.  This is a 60 to 80% success rate. -I am also going to send in a calcium channel blocker cream to a compound pharmacy, apply twice daily for 12 weeks. If after 3 months this is not helpful then we will we will refer you to general surgery for evaluation, they can do Botox injections under anesthesia or surgery.  Diltiazem/lidocaine 3 x daily for 2 months sent to compound pharmacy   Sent this medication to a compound pharmacy:  [facility] [address] [place], [place], [place] [address]  [phone]  Please DO NOT go directly from our office to pick up this medication! Give the pharmacy 1 day to process the prescription. Extra time is required for them to compound your medication.  Please send in [place]- take as directed- but do 6 oz every 30 mins until the impaction passes, can do 1/2 gallon on first day and 1/2 gallon on 2nd day.  Can continue 2 fleets enemas a day until it resolves.  Go to ER if any severe AB pain, vomiting/unable to hold down food/drink, blood in stool, or unable to pass gas.  Miralax is an osmotic laxative.  It only brings more water into the stool.  This is safe to take daily.  Can take up to 17 gram of miralax twice a day.  Mix with juice or coffee.   Start 1 capful at night for 3-4 days and reassess your response in 3-4 days.  You can increase and decrease the dose based on your response.  Remember, it can take up to 3-4 days to take effect OR for the effects to wear off.   I often pair this with benefiber in the morning to help assure the stool is not too loose.   If this does not work can try linzess.   Toileting tips to help with your constipation - Drink at least 64-80 ounces of water/liquid per day. - Establish a time to try to move your bowels every day.  For many people, this is after a cup of coffee or after a meal such as breakfast. - Sit all of the way back on the toilet keeping your back fairly straight and while sitting up, try to rest the tops of your forearms on your upper thighs.   - Raising your feet with a step stool/squatty potty can be helpful to improve the angle that allows your stool to pass through the rectum. - Relax the rectum feeling it bulge toward the toilet water.  If you feel your rectum raising toward your body, you are contracting rather than relaxing. - Breathe in and slowly exhale. "Belly breath" by expanding your belly towards your belly button. Keep belly expanded as you gently direct pressure down and back to the anus.  A low pitched  GRRR sound can assist with increasing intra-abdominal pressure.  (Can also trying to blow on a pinwheel and make it move, this helps with the same belly breathing) - Repeat 3-4 times. If unsuccessful, contract the pelvic floor to restore normal tone and get off the toilet.  Avoid excessive straining. - To reduce excessive wiping by teaching your anus to normally contract, place hands on outer aspect of knees and resist knee movement outward.  Hold 5-10 second then place hands just inside of knees and resist inward movement of knees.  Hold 5 seconds.  Repeat a few times each way.  Go to the ER if unable to pass gas, severe AB pain, unable to hold down food, any shortness of  breath of chest pain.  Can consider pelvic floor PT

## 2022-12-26 NOTE — Progress Notes (Signed)
Addendum: Reviewed and agree with assessment and management plan. Kadijah Shamoon M, MD  

## 2023-01-03 DIAGNOSIS — M546 Pain in thoracic spine: Secondary | ICD-10-CM | POA: Diagnosis not present

## 2023-01-03 DIAGNOSIS — M542 Cervicalgia: Secondary | ICD-10-CM | POA: Diagnosis not present

## 2023-01-06 ENCOUNTER — Other Ambulatory Visit: Payer: Self-pay | Admitting: Medical

## 2023-01-11 DIAGNOSIS — J029 Acute pharyngitis, unspecified: Secondary | ICD-10-CM | POA: Diagnosis not present

## 2023-01-11 DIAGNOSIS — R0981 Nasal congestion: Secondary | ICD-10-CM | POA: Diagnosis not present

## 2023-01-12 ENCOUNTER — Ambulatory Visit: Payer: PPO | Admitting: Obstetrics and Gynecology

## 2023-01-12 ENCOUNTER — Encounter: Payer: Self-pay | Admitting: Obstetrics and Gynecology

## 2023-01-12 VITALS — BP 124/74 | HR 83 | Ht 61.02 in | Wt 126.0 lb

## 2023-01-12 DIAGNOSIS — Z01419 Encounter for gynecological examination (general) (routine) without abnormal findings: Secondary | ICD-10-CM | POA: Diagnosis not present

## 2023-01-12 DIAGNOSIS — N763 Subacute and chronic vulvitis: Secondary | ICD-10-CM | POA: Diagnosis not present

## 2023-01-12 DIAGNOSIS — L309 Dermatitis, unspecified: Secondary | ICD-10-CM

## 2023-01-12 DIAGNOSIS — N958 Other specified menopausal and perimenopausal disorders: Secondary | ICD-10-CM | POA: Diagnosis not present

## 2023-01-12 DIAGNOSIS — M816 Localized osteoporosis [Lequesne]: Secondary | ICD-10-CM | POA: Diagnosis not present

## 2023-01-12 DIAGNOSIS — N9089 Other specified noninflammatory disorders of vulva and perineum: Secondary | ICD-10-CM | POA: Insufficient documentation

## 2023-01-12 MED ORDER — CLOBETASOL PROPIONATE 0.05 % EX OINT: TOPICAL_OINTMENT | CUTANEOUS | 0 refills | Status: AC

## 2023-01-12 MED ORDER — ESTRADIOL 0.1 MG/GM VA CREA: TOPICAL_CREAM | VAGINAL | 1 refills | Status: AC

## 2023-01-12 NOTE — Assessment & Plan Note (Signed)
Cervical cancer screening discontinued due to age Encouraged annual mammogram screening Colonoscopy 2019 DXA 2024, managed by PCP Labs and immunizations with her primary Encouraged safe sexual practices as indicated Encouraged healthy lifestyle practices with diet and exercise For patients over 79yo, I recommend 1200mg  calcium daily and 800IU of vitamin D daily.

## 2023-01-12 NOTE — Progress Notes (Signed)
77 y.o. [id number] postmenopausal female with osteoporosis (managed by PCP, on prolia, started 2024), rectocele, hemorrhoids, and chronic vulvitis, perianal dermatitis here for annual exam. Married.  Postmenopausal bleeding: none Pelvic discharge or pain: none Breast mass, nipple discharge or skin changes : none Last PAP: 2016, no longer screening Last mammogram: 12/21/22 BIRADS 1, density b Last colonoscopy: 07/17/19  Last DXA: 03/16/22 osteoporosis, worsened despite bisphosphonate Exercising: cardio and strength training 3x days for one hour   GYN HISTORY: No significant history  OB History  Gravida Para Term Preterm AB Living  3 3 3  0 0 3  SAB IAB Ectopic Multiple Live Births  0 0 0 0 3    # Outcome Date GA Lbr Len/2nd Weight Sex Type Anes PTL Lv  3 Term 1979    M Vag-Spont   LIV  2 Term 1975    F Vag-Spont   LIV  1 Term 51    F Vag-Spont   LIV    Past Medical History:  Diagnosis Date   Anal fissure    Anxiety    sees [facility] [place], [place])   Asthma    Colon polyps 08/2014   tubular adenoma and hyperplastic polyps (Dr. Rhea Belton)   Cystocele with rectocele 2023   DDD (degenerative disc disease), cervical dr Noel Gerold   s/p surgery, ongoing pain   Depression    h/o hospitalization with ECT   Diverticulosis    Esophagitis    Hiatal hernia    History of insomnia    Hypertension    Internal hemorrhoids    Neck pain    Osteopenia stopped fosamax ~2006   Post-operative nausea and vomiting    Vertigo    Vitamin D deficiency borderline(28)-2010   treated by her GYN    Past Surgical History:  Procedure Laterality Date   CATARACT EXTRACTION, BILATERAL Bilateral Sept/October 2019   HEMORRHOID BANDING  12/2020   Dr. Rhea Belton   HEMORRHOID SURGERY  2006, 12/2014   KNEE ARTHROSCOPY Right    LASIK     Dr. Delaney Meigs   NASAL SEPTOPLASTY W/ TURBINOPLASTY  1986   NECK SURGERY  02/2010([facility])1/03,2005([facility])   RECTOCELE REPAIR  04/04/2022   [facility]   REDUCTION MAMMAPLASTY  Bilateral 07/24/2008   TONSILLECTOMY     TOTAL HIP ARTHROPLASTY Left 08/2007       TOTAL SHOULDER ARTHROPLASTY Left 08/30/2018   Procedure: TOTAL SHOULDER ARTHROPLASTY;  Surgeon: Francena Hanly, MD;  Location: [facility];  Service: Orthopedics;  Laterality: Left;  , move scope to 12:30pm    Current Outpatient Medications on File Prior to Visit  Medication Sig Dispense Refill   albuterol (VENTOLIN HFA) 108 (90 Base) MCG/ACT inhaler TAKE 2 PUFFS BY MOUTH EVERY 4 HOURS AS NEEDED 8.5 each 0   AMBULATORY NON FORMULARY MEDICATION Medication Name: Diltiazem 2%/Lidocaine 2%   Using your index finger apply a small amount of medication inside the anal opening and to the external anal area twice daily x 6 weeks. 30 g 1   amLODipine (NORVASC) 5 MG tablet Take 1 tablet (5 mg total) by mouth daily. 90 tablet 3   b complex vitamins capsule Take 1 capsule by mouth daily.     Calcium Carbonate-Vitamin D (CALCIUM PLUS VITAMIN D PO) Take 2 tablets by mouth daily.     Cholecalciferol (VITAMIN D) 125 MCG (5000 UT) CAPS Take 1 capsule by mouth daily.     denosumab (PROLIA) 60 MG/ML SOSY injection Inject 60 mg into the skin every 6 (  six) months.     diltiazem 2 % GEL Using your index finger, apply a small amount of medication inside the rectum up to your first knuckle/joint twice daily x 8 weeks. 30 g 1   famotidine (PEPCID) 40 MG tablet TAKE 1 TABLET BY MOUTH TWICE A DAY 180 tablet 1   FLOVENT HFA 110 MCG/ACT inhaler Inhale 2 puffs into the lungs 2 (two) times daily.     HYDROcodone-acetaminophen (NORCO/VICODIN) 5-325 MG tablet Take 0.5 tablets by mouth every 6 (six) hours as needed for moderate pain.     lamoTRIgine (LAMICTAL) 150 MG tablet Take 150 mg by mouth at bedtime.     Liniments (SALONPAS PAIN RELIEF PATCH EX) Place 1 patch onto the skin daily as needed (pain).     Multiple Vitamins-Minerals (CENTRUM SILVER 50+WOMEN PO) Take 1 tablet by mouth.     Multiple Vitamins-Minerals (HAIR SKIN AND NAILS FORMULA  PO) Take 3 capsules by mouth daily.     nystatin cream (MYCOSTATIN) Apply 1 Application topically 2 (two) times daily. On rectum 30 g 1   tretinoin (RETIN-A) 0.025 % cream APPLY TO AFFECTED AREA AT BEDTIME     zolpidem (AMBIEN) 10 MG tablet Take 2.5 mg by mouth at bedtime.      No current facility-administered medications on file prior to visit.    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 Has 3 kids, 4 grandchildren. Daughter (Kendra--drug rep, lives here in [place])   Other children live in [place]; Son moved to [place]. Maisie Fus      Updated 09/2022   Social Determinants of Health   Financial Resource Strain: Low Risk  (10/10/2022)   Overall Financial Resource Strain (CARDIA)    Difficulty of Paying Living Expenses: Not very hard  Food Insecurity: No Food Insecurity (10/10/2022)   Hunger Vital Sign    Worried About Running Out of Food in the Last Year: Never true    Ran Out of Food in the Last Year: Never true  Transportation Needs: No Transportation Needs (10/10/2022)   PRAPARE - Administrator, Civil Service (Medical): No    Lack of Transportation (Non-Medical): No  Physical Activity: Unknown (10/10/2022)   Exercise Vital Sign    Days of Exercise per Week: 3 days    Minutes of Exercise per Session: Not on file  Stress: Stress  Concern Present (10/10/2022)   [employer] of Occupational Health - Occupational Stress Questionnaire    Feeling of Stress : To some extent  Social Connections: Unknown (10/10/2022)   Social Connection and Isolation Panel [NHANES]    Frequency of Communication with Friends and Family: More than three times a week    Frequency of Social Gatherings with  Friends and Family: More than three times a week    Attends Religious Services: More than 4 times per year    Active Member of Clubs or Organizations: Yes    Attends Banker Meetings: More than 4 times per year    Marital Status: Patient declined  Intimate Partner Violence: Not At Risk (10/10/2022)   Humiliation, Afraid, Rape, and Kick questionnaire    Fear of Current or Ex-Partner: No    Emotionally Abused: No    Physically Abused: No    Sexually Abused: No    Family History  Problem Relation Age of Onset   Arthritis Mother    Depression Mother    Pernicious anemia Mother    Heart disease Father 32   Heart attack Father    Depression Brother    Diabetes Paternal Uncle    Breast cancer Cousin        paternal   Colon cancer Neg Hx    Rectal cancer Neg Hx    Stomach cancer Neg Hx    Pancreatic disease Neg Hx    Esophageal cancer Neg Hx     Allergies  Allergen Reactions   Codeine Other (See Comments)    Other Reaction(s): "goes crazy"  "goes crazy"   Cymbalta [Duloxetine Hcl]     Abdominal pain   Nitrofurantoin [Macrodantin]    Sulfamethoxazole Hives   Cefdinir Rash      PE Today's Vitals   01/12/23 1329  BP: 124/74  Pulse: 83  SpO2: 96%  Weight: 126 lb (57.2 kg)  Height: 5' 1.02" (1.55 m)   Body mass index is 23.79 kg/m.  Physical Exam Vitals reviewed. Exam conducted with a chaperone present.  Constitutional:      General: She is not in acute distress.    Appearance: Normal appearance.  HENT:     Head: Normocephalic and atraumatic.     Nose: Nose normal.  Eyes:     Extraocular  Movements: Extraocular movements intact.     Conjunctiva/sclera: Conjunctivae normal.  Neck:     Thyroid: No thyroid mass, thyromegaly or thyroid tenderness.  Pulmonary:     Effort: Pulmonary effort is normal.  Chest:     Chest wall: No mass or tenderness.  Breasts:    Right: Normal. No swelling, mass, nipple discharge, skin change or tenderness.     Left: Normal. No swelling, mass, nipple discharge, skin change or tenderness.  Abdominal:     General: There is no distension.     Palpations: Abdomen is soft.     Tenderness: There is no abdominal tenderness.  Genitourinary:    General: Normal vulva.     Exam position: Lithotomy position.     Urethra: No prolapse.     Vagina: Signs of injury present. No vaginal discharge or bleeding.     Cervix: Normal. No lesion.     Uterus: Normal. Not enlarged and not tender.      Adnexa: Right adnexa normal and left adnexa normal.       Comments: Vaginal atrophy, bilateral irregular erythematous patches of labia minora Musculoskeletal:        General: Normal range of motion.     Cervical back: Normal range of motion.  Lymphadenopathy:     Upper Body:     Right upper body: No axillary adenopathy.     Left upper body: No axillary adenopathy.     Lower Body: No right inguinal adenopathy. No left inguinal adenopathy.  Skin:  General: Skin is warm and dry.  Neurological:     General: No focal deficit present.     Mental Status: She is alert.  Psychiatric:        Mood and Affect: Mood normal.        Behavior: Behavior normal.       Assessment and Plan:        Genitourinary syndrome of menopause -     Estradiol; Apply 1/2 gram to vulva nightly for 2 weeks then decrease to 1/2 gram to vulva two nights a week.  Dispense: 42.5 g; Refill: 1  Chronic vulvitis -     Clobetasol Propionate; Apply a small amount topically BID x 2 weeks as needed.  Dispense: 60 g; Refill: 0  Perianal dermatitis -     Clobetasol Propionate; Apply a small amount  topically BID x 2 weeks as needed.  Dispense: 60 g; Refill: 0  Well woman exam with routine gynecological exam Assessment & Plan: Cervical cancer screening discontinued due to age Encouraged annual mammogram screening Colonoscopy 2019 DXA 2024, managed by PCP Labs and immunizations with her primary Encouraged safe sexual practices as indicated Encouraged healthy lifestyle practices with diet and exercise For patients over 70yo, I recommend 1200mg  calcium daily and 800IU of vitamin D daily.    Vulvar lesion Assessment & Plan: RTO for reassessment in 1 wk and likely punch biopsy.   Localized osteoporosis without current pathological fracture Assessment & Plan: On prolia, managed by PCP     Rosalyn Gess, MD

## 2023-01-12 NOTE — Assessment & Plan Note (Signed)
RTO for reassessment in 1 wk and likely punch biopsy.

## 2023-01-12 NOTE — Assessment & Plan Note (Signed)
On prolia, managed by PCP

## 2023-01-12 NOTE — Patient Instructions (Signed)
For patients under 50-79yo, I recommend 1200mg  calcium daily and 600IU of vitamin D daily. For patients over 79yo, I recommend 1200mg  calcium daily and 800IU of vitamin D daily.  Health Maintenance, Female Adopting a healthy lifestyle and getting preventive care are important in promoting health and wellness. Ask your health care provider about: The right schedule for you to have regular tests and exams. Things you can do on your own to prevent diseases and keep yourself healthy. What should I know about diet, weight, and exercise? Eat a healthy diet  Eat a diet that includes plenty of vegetables, fruits, low-fat dairy products, and lean protein. Do not eat a lot of foods that are high in solid fats, added sugars, or sodium. Maintain a healthy weight Body mass index (BMI) is used to identify weight problems. It estimates body fat based on height and weight. Your health care provider can help determine your BMI and help you achieve or maintain a healthy weight. Get regular exercise Get regular exercise. This is one of the most important things you can do for your health. Most adults should: Exercise for at least 150 minutes each week. The exercise should increase your heart rate and make you sweat (moderate-intensity exercise). Do strengthening exercises at least twice a week. This is in addition to the moderate-intensity exercise. Spend less time sitting. Even light physical activity can be beneficial. Watch cholesterol and blood lipids Have your blood tested for lipids and cholesterol at 79 years of age, then have this test every 5 years. Have your cholesterol levels checked more often if: Your lipid or cholesterol levels are high. You are older than 79 years of age. You are at high risk for heart disease. What should I know about cancer screening? Depending on your health history and family history, you may need to have cancer screening at various ages. This may include screening  for: Breast cancer. Cervical cancer. Colorectal cancer. Skin cancer. Lung cancer. What should I know about heart disease, diabetes, and high blood pressure? Blood pressure and heart disease High blood pressure causes heart disease and increases the risk of stroke. This is more likely to develop in people who have high blood pressure readings or are overweight. Have your blood pressure checked: Every 3-5 years if you are 83-79 years of age. Every year if you are 4 years old or older. Diabetes Have regular diabetes screenings. This checks your fasting blood sugar level. Have the screening done: Once every three years after age 68 if you are at a normal weight and have a low risk for diabetes. More often and at a younger age if you are overweight or have a high risk for diabetes. What should I know about preventing infection? Hepatitis B If you have a higher risk for hepatitis B, you should be screened for this virus. Talk with your health care provider to find out if you are at risk for hepatitis B infection. Hepatitis C Testing is recommended for: Everyone born from [birth year] through 1967. Anyone with known risk factors for hepatitis C. Sexually transmitted infections (STIs) Get screened for STIs, including gonorrhea and chlamydia, if: You are sexually active and are younger than 79 years of age. You are older than 79 years of age and your health care provider tells you that you are at risk for this type of infection. Your sexual activity has changed since you were last screened, and you are at increased risk for chlamydia or gonorrhea. Ask your health care provider if  you are at risk. Ask your health care provider about whether you are at high risk for HIV. Your health care provider may recommend a prescription medicine to help prevent HIV infection. If you choose to take medicine to prevent HIV, you should first get tested for HIV. You should then be tested every 3 months for as long as you  are taking the medicine. Osteoporosis and menopause Osteoporosis is a disease in which the bones lose minerals and strength with aging. This can result in bone fractures. If you are 44 years old or older, or if you are at risk for osteoporosis and fractures, ask your health care provider if you should: Be screened for bone loss. Take a calcium or vitamin D supplement to lower your risk of fractures. Be given hormone replacement therapy (HRT) to treat symptoms of menopause. Follow these instructions at home: Alcohol use Do not drink alcohol if: Your health care provider tells you not to drink. You are pregnant, may be pregnant, or are planning to become pregnant. If you drink alcohol: Limit how much you have to: 0-1 drink a day. Know how much alcohol is in your drink. In the [place] one drink equals one 12 oz bottle of beer (355 mL), one 5 oz glass of wine (148 mL), or one 1 oz glass of hard liquor (44 mL). Lifestyle Do not use any products that contain nicotine or tobacco. These products include cigarettes, chewing tobacco, and vaping devices, such as e-cigarettes. If you need help quitting, ask your health care provider. Do not use street drugs. Do not share needles. Ask your health care provider for help if you need support or information about quitting drugs. General instructions Schedule regular health, dental, and eye exams. Stay current with your vaccines. Tell your health care provider if: You often feel depressed. You have ever been abused or do not feel safe at home. Summary Adopting a healthy lifestyle and getting preventive care are important in promoting health and wellness. Follow your health care provider's instructions about healthy diet, exercising, and getting tested or screened for diseases. Follow your health care provider's instructions on monitoring your cholesterol and blood pressure. This information is not intended to replace advice given to you by your health  care provider. Make sure you discuss any questions you have with your health care provider. Document Revised: 07/06/2020 Document Reviewed: 07/06/2020 Elsevier Patient Education  2024 [employer].

## 2023-01-20 ENCOUNTER — Telehealth: Payer: Self-pay

## 2023-01-20 NOTE — Telephone Encounter (Signed)
Pt LVM in triage line stating that she is using cream rx'd by [facility] from OV on 01/12/2023 but thinks she is noticing a sore/bump on perineum.   Per OV notes: for pt to f/u in 1wk after appt. Has f/u scheduled on 02/14/2023.  Spoke w/ pt and she reported that she is using the nystatin cream that was dispensed to her by pharmacy since being seen.   Pt advised that rx was not prescribed by [place], but another provider sent that in and the directions for that cream was for her to use on the rectum. Not sure if her PCP or another specialty but provider's name was provided and pt reported that provider was from her GI.  Pt advised that at the time of OV, it was advised to her by Dr. Kennith Center to use clobetasol BID x 2 weeks prn and also gave directions for estrogen.   Reports has been using estradiol once weekly since before Dr. Kennith Center prescribed and the clobetasol, applying small amt on finger daily.   Pt advised to consider estrogen therapy as a "restart" with doing the daily applications and then going back to maintenance dosing.   Then also advised pt about over use of steroid ointment possibly causing thinning of her skin but will confirm with physician and contact her back with additional recommendations.   Pt voiced concern that she receives clobetasol ointment monthly and felt as if she was not made aware prior that she should not use daily for a long period of time due to it being a steroid and the side effects that could happen due to that long term use. Appreciative of the information during call.   Please advise if anything additional needs to be advised to pt.

## 2023-01-24 ENCOUNTER — Telehealth: Payer: Self-pay | Admitting: Internal Medicine

## 2023-01-24 DIAGNOSIS — K5732 Diverticulitis of large intestine without perforation or abscess without bleeding: Secondary | ICD-10-CM

## 2023-01-24 MED ORDER — AMOXICILLIN-POT CLAVULANATE 875-125 MG PO TABS: 1.0000 | ORAL_TABLET | Freq: Two times a day (BID) | ORAL | 0 refills | Status: DC

## 2023-01-24 NOTE — Telephone Encounter (Addendum)
Inbound call from patient stating she is having a diverticulitis flare up and is requesting prescription be sent to pharmacy. Patient is also wanting to schedule an appointment to be seen and does not want to wait until February.   Please advise.

## 2023-01-24 NOTE — Telephone Encounter (Signed)
Pt states she had diverticulitis a few years back and last night she started having some symptoms with LLQ pain. It got better earlier today and she has only eaten soup today but now the symptoms are getting worse. She reports the LLQ abd pain and excessive burping. She denies any fever. Pt would like to have medications called in for her. Please advise.

## 2023-01-24 NOTE — Telephone Encounter (Signed)
History of diverticulosis and prior diverticulitis  Augmentin 875 mg twice daily x 7 days  Have her keep [place] updated and we can try to work her in for an urgent appointment if symptoms or not improving

## 2023-01-25 ENCOUNTER — Other Ambulatory Visit: Payer: Self-pay

## 2023-01-25 MED ORDER — AMOXICILLIN-POT CLAVULANATE 875-125 MG PO TABS: 1.0000 | ORAL_TABLET | Freq: Two times a day (BID) | ORAL | 0 refills | Status: DC

## 2023-01-25 NOTE — Telephone Encounter (Signed)
Spoke with pt and she is aware of recommendations per Dr. Rhea Belton and has already picked up the prescription.

## 2023-02-01 ENCOUNTER — Ambulatory Visit: Payer: PPO | Admitting: Obstetrics and Gynecology

## 2023-02-10 NOTE — Telephone Encounter (Signed)
Per JC:  "Nothing more."  Pt scheduled for an appt on 02/14/2023 for f/u and possible bx.   Encounter closed.

## 2023-02-14 ENCOUNTER — Ambulatory Visit: Payer: PPO | Admitting: Obstetrics and Gynecology

## 2023-02-15 ENCOUNTER — Ambulatory Visit (INDEPENDENT_AMBULATORY_CARE_PROVIDER_SITE_OTHER): Payer: PPO | Admitting: Obstetrics and Gynecology

## 2023-02-15 ENCOUNTER — Encounter: Payer: Self-pay | Admitting: Obstetrics and Gynecology

## 2023-02-15 VITALS — BP 122/64 | HR 71 | Wt 128.0 lb

## 2023-02-15 DIAGNOSIS — N952 Postmenopausal atrophic vaginitis: Secondary | ICD-10-CM | POA: Diagnosis not present

## 2023-02-15 DIAGNOSIS — N958 Other specified menopausal and perimenopausal disorders: Secondary | ICD-10-CM

## 2023-02-15 DIAGNOSIS — N9089 Other specified noninflammatory disorders of vulva and perineum: Secondary | ICD-10-CM

## 2023-02-15 NOTE — Progress Notes (Signed)
77 y.o. [id number] postmenopausal female with osteoporosis (managed by PCP, on prolia, started 2024), rectocele, hemorrhoids, and chronic vulvitis, perianal dermatitis here for follow-up of vulvar lesion. Married.  No irritation or itching. Still noticing some redness.  Postmenopausal bleeding: none Pelvic discharge or pain: none Breast mass, nipple discharge or skin changes : none Last PAP: 2016, no longer screening Last mammogram: 12/21/22 BIRADS 1, density b Last colonoscopy: 07/17/19  Last DXA: 03/16/22 osteoporosis, worsened despite bisphosphonate Exercising: cardio and strength training 3x days for one hour   GYN HISTORY: No significant history Past Medical History:  Diagnosis Date   Anal fissure    Anxiety    sees [facility] [place], [place])   Asthma    Colon polyps 08/2014   tubular adenoma and hyperplastic polyps (Dr. Rhea Belton)   Cystocele with rectocele 2023   DDD (degenerative disc disease), cervical dr Noel Gerold   s/p surgery, ongoing pain   Depression    h/o hospitalization with ECT   Diverticulosis    Esophagitis    Hiatal hernia    History of insomnia    Hypertension    Internal hemorrhoids    Neck pain    Osteopenia stopped fosamax ~2006   Post-operative nausea and vomiting    Vertigo    Vitamin D deficiency borderline(28)-2010   treated by her GYN    Past Surgical History:  Procedure Laterality Date   CATARACT EXTRACTION, BILATERAL Bilateral Sept/October 2019   HEMORRHOID BANDING  12/2020   Dr. Rhea Belton   HEMORRHOID SURGERY  2006, 12/2014   KNEE ARTHROSCOPY Right    LASIK     Dr. Delaney Meigs   NASAL SEPTOPLASTY W/ TURBINOPLASTY  1986   NECK SURGERY  02/2010([facility])1/03,2005([facility])   RECTOCELE REPAIR  04/04/2022   [facility]   REDUCTION MAMMAPLASTY Bilateral 07/24/2008   TONSILLECTOMY     TOTAL HIP ARTHROPLASTY Left 08/2007       TOTAL SHOULDER ARTHROPLASTY Left 08/30/2018   Procedure: TOTAL SHOULDER ARTHROPLASTY;  Surgeon: Francena Hanly, MD;  Location: [facility];  Service: Orthopedics;  Laterality: Left;  , move scope to 12:30pm    Current Outpatient Medications on File Prior to Visit  Medication Sig Dispense Refill   albuterol (VENTOLIN HFA) 108 (90 Base) MCG/ACT inhaler TAKE 2 PUFFS BY MOUTH EVERY 4 HOURS AS NEEDED 8.5 each 0   amLODipine (NORVASC) 5 MG tablet Take 1 tablet (5 mg total) by mouth daily. 90 tablet 3   b complex vitamins capsule Take 1 capsule by mouth daily.     Calcium Carbonate-Vitamin D (CALCIUM PLUS VITAMIN D PO) Take 2 tablets by mouth daily.     Cholecalciferol (VITAMIN D) 125 MCG (5000 UT) CAPS Take 1 capsule by mouth daily.     clobetasol ointment (TEMOVATE) 0.05 % Apply a small amount topically BID x 2 weeks as needed. 60 g 0   denosumab (PROLIA) 60 MG/ML SOSY injection Inject 60 mg into the skin every 6 (six) months.     diltiazem 2 % GEL Using your index finger, apply a small amount of medication inside the rectum up to your first knuckle/joint twice daily x 8 weeks. 30 g 1   estradiol (ESTRACE VAGINAL) 0.1 MG/GM vaginal cream Apply 1/2 gram to vulva nightly for 2 weeks then decrease to 1/2 gram to vulva two nights a week. 42.5 g 1   famotidine (PEPCID) 40 MG tablet TAKE 1 TABLET BY MOUTH TWICE A DAY 180 tablet 1   FLOVENT HFA 110 MCG/ACT inhaler  Inhale 2 puffs into the lungs 2 (two) times daily.     HYDROcodone-acetaminophen (NORCO/VICODIN) 5-325 MG tablet Take 0.5 tablets by mouth every 6 (six) hours as needed for moderate pain.     lamoTRIgine (LAMICTAL) 150 MG tablet Take 150 mg by mouth at bedtime.     Liniments (SALONPAS PAIN RELIEF PATCH EX) Place 1 patch onto the skin daily as needed (pain).     Multiple Vitamins-Minerals (CENTRUM SILVER 50+WOMEN PO) Take 1 tablet by mouth.     Multiple Vitamins-Minerals (HAIR SKIN AND NAILS FORMULA PO) Take 3 capsules by mouth daily.     nystatin cream (MYCOSTATIN) Apply 1 Application topically 2 (two) times daily. On rectum 30 g 1   tretinoin (RETIN-A) 0.025 % cream APPLY  TO AFFECTED AREA AT BEDTIME     zolpidem (AMBIEN) 10 MG tablet Take 2.5 mg by mouth at bedtime.      No current facility-administered medications on file prior to visit.   Allergies  Allergen Reactions   Codeine Other (See Comments)    Other Reaction(s): "goes crazy"  "goes crazy"   Cymbalta [Duloxetine Hcl]     Abdominal pain   Nitrofurantoin [Macrodantin]    Sulfamethoxazole Hives   Cefdinir Rash      PE Today's Vitals   02/15/23 0921  BP: 122/64  Pulse: 71  SpO2: 98%  Weight: 128 lb (58.1 kg)   Body mass index is 24.17 kg/m.  Physical Exam Vitals reviewed.  Constitutional:      General: She is not in acute distress.    Appearance: Normal appearance.  HENT:     Head: Normocephalic and atraumatic.     Nose: Nose normal.  Eyes:     Extraocular Movements: Extraocular movements intact.     Conjunctiva/sclera: Conjunctivae normal.  Pulmonary:     Effort: Pulmonary effort is normal.  Genitourinary:      Comments: Improved, erytjema resolved. White area of perineum persists, however improved Musculoskeletal:        General: Normal range of motion.     Cervical back: Normal range of motion.  Neurological:     General: No focal deficit present.     Mental Status: She is alert.  Psychiatric:        Mood and Affect: Mood normal.        Behavior: Behavior normal.       Assessment and Plan:        Vulvar lesion Assessment & Plan: Significant improvement with vaginal estrogen and topical clobetasol. Decrease both therapies to twice weekly use Return office with worsening symptoms and for annual exam   Genitourinary syndrome of menopause  See above  Maria Gess, MD

## 2023-02-15 NOTE — Assessment & Plan Note (Signed)
Significant improvement with vaginal estrogen and topical clobetasol. Decrease both therapies to twice weekly use Return office with worsening symptoms and for annual exam

## 2023-03-02 ENCOUNTER — Telehealth: Payer: Self-pay | Admitting: Internal Medicine

## 2023-03-02 DIAGNOSIS — M810 Age-related osteoporosis without current pathological fracture: Secondary | ICD-10-CM

## 2023-03-02 MED ORDER — DENOSUMAB 60 MG/ML ~~LOC~~ SOSY
60.0000 mg | PREFILLED_SYRINGE | Freq: Once | SUBCUTANEOUS | Status: AC
  Administered 2023-05-08: 60 mg via SUBCUTANEOUS

## 2023-03-02 NOTE — Telephone Encounter (Signed)
 See prolia referral  Due on or after 05/03/2023

## 2023-03-13 ENCOUNTER — Ambulatory Visit (INDEPENDENT_AMBULATORY_CARE_PROVIDER_SITE_OTHER): Payer: PPO

## 2023-03-13 ENCOUNTER — Ambulatory Visit: Payer: PPO | Admitting: Podiatry

## 2023-03-13 ENCOUNTER — Encounter: Payer: Self-pay | Admitting: Podiatry

## 2023-03-13 DIAGNOSIS — M778 Other enthesopathies, not elsewhere classified: Secondary | ICD-10-CM

## 2023-03-13 DIAGNOSIS — M779 Enthesopathy, unspecified: Secondary | ICD-10-CM

## 2023-03-13 MED ORDER — TRIAMCINOLONE ACETONIDE 10 MG/ML IJ SUSP
10.0000 mg | Freq: Once | INTRAMUSCULAR | Status: AC
  Administered 2023-03-13: 10 mg via INTRA_ARTICULAR

## 2023-03-15 NOTE — Progress Notes (Signed)
 Subjective:   Patient ID: [name], female   DOB: 78 y.o.   MRN: [id number]   HPI Patient states she was better for couple months but she is having quite a bit of a reoccurrence of pain and has not used any topical medicine neuro eval   ROS      Objective:  Physical Exam  Ocular status intact with severe arthritis of the metatarsocuneiform joints digits 2 and 3 left with fluid buildup around the area     Assessment:  Tendinitis of the dorsal left foot with arthritis as underlying pathology     Plan:  H&P reviewed x-ray and did discuss that this would be a difficult surgical correction to undertake but if symptoms do not improve it is possible this will need to be done.  I went ahead today and I did do sterile prep I injected the tendon complex 3 mg dexamethasone  Kenalog  5 mg Xylocaine  after explaining risk and begin Voltaren gel and depending on symptoms we may need to get a CT scan of this area.

## 2023-03-23 NOTE — Progress Notes (Deleted)
03/23/2023 [name] [id number] Aug 20, 1945  Referring provider: Joselyn Arrow, MD Primary GI doctor: Dr. Rhea Belton  ASSESSMENT AND PLAN:   Anal Fissure Rectal pain and bleeding with bowel movements, likely secondary to constipation and hard stools. Noted a visible fissure on examination. -Apply Diltiazem cream and Calmol 4 suppositories twice daily for two months. -Consider pelvic floor physical therapy for improved bowel movement coordination post surgery - can consider surgical referral if not healing.  Chronic Constipation Longstanding issue, exacerbated by hydrocodone and recent addition of Carafate (sucralfate). Stool softeners and laxatives have been minimally effective. - Hard large stool volume on exam, too tender for disimpaction this visit -Initiate bowel purge with Trilyte over two days and Fleet's enemas. -Start daily Miralax, up to twice daily as needed. -Consider Linzess if Miralax is ineffective.  Perianal Yeast Infection Noted on examination, likely contributing to itching and discomfort. -Apply prescribed antifungal cream.  Reflux On Carafate (sucralfate) for a recent diagnosis of a small ulcer. Not currently on Pepcid due to misunderstanding of instructions. No dysphagia, no weight loss.  -Resume Pepcid for reflux management.  Follow-up in 2-3 months to assess response to treatment.  Call sooner if symptoms worsen or do not improve  Patient Care Team: Joselyn Arrow, MD as PCP - General (Family Medicine) Pyrtle, Carie Caddy, MD as Consulting Physician (Gastroenterology) Durene Romans, MD as Consulting Physician (Orthopedic Surgery) Karie Soda, MD as Consulting Physician (General Surgery) Verner Chol, [facility] (Inactive) as Pharmacist (Pharmacist) Blima Ledger, OD (Optometry) Tyrone Schimke, MD as Consulting Physician (Ophthalmology) Cherlyn Roberts, MD as Referring Physician (Dermatology) Regal, Kirstie Peri, DPM as Consulting Physician (Podiatry) Patricia Nettle, MD (Orthopedic Surgery) Durene Romans, MD as Consulting Physician (Orthopedic Surgery) Francena Hanly, MD as Consulting Physician (Orthopedic Surgery) Donalee Citrin, MD as Consulting Physician (Neurosurgery) Odette Fraction, MD (Inactive) as Referring Physician (Anesthesiology) Sheran Luz, MD as Consulting Physician (Physical Medicine and Rehabilitation) Callie Fielding, MD as Consulting Physician (Physical Medicine and Rehabilitation) Christia Reading, MD as Consulting Physician (Otolaryngology)  HISTORY OF PRESENT ILLNESS: 78 y.o. female with a past medical history of hypertension, internal hemorrhoids, esophagitis, diverticulosis, anal fissure, personal history of colon polyps and others listed below presents for evaluation of rectal discomfort.   The patient's last colonoscopy was in 2021, which showed diverticula and hemorrhoids. She also had surgery for her enterocele, and rectocele in February of the same year. She reports that her constipation has worsened since the surgery.  Patient seen 12/26/2022 for rectal pain with bleeding with bowel movements and worsening constipation.  On exam patient had hard large stool volume too tender for disimpaction, also had anal fissure.  Given diltiazem cream and Calmol 4 suppositories, given TriLyte fecal impaction purge with enemas.   Patient called 01/24/2023 for possible diverticulitis flare with left lower quadrant abdominal discomfort excessive burping given Augmentin twice daily for 7 days.  She  reports that she has quit smoking. Her smoking use included cigarettes. She has never used smokeless tobacco. She reports current alcohol use. She reports that she does not use drugs.  RELEVANT LABS AND IMAGING: 06/2019 colonoscopy for follow-up of adenomatous colon polyps with multiple sigmoid and left colon diverticuli and small internal hemorrhoids.  04/04/2022 surgery at [facility] for stage II rectocele with obstructive defecation status post enterocele  repair.   CBC    Component Value Date/Time   WBC 4.6 10/10/2022 0938   WBC 6.4 08/27/2018 1514   RBC 3.97 10/10/2022 0938   RBC 4.22 08/27/2018 1514  HGB 12.1 10/10/2022 0938   HCT 35.8 10/10/2022 0938   PLT 244 10/10/2022 0938   MCV 90 10/10/2022 0938   MCH 30.5 10/10/2022 0938   MCH 29.9 08/27/2018 1514   MCHC 33.8 10/10/2022 0938   MCHC 32.3 08/27/2018 1514   RDW 12.6 10/10/2022 0938   LYMPHSABS 1.3 10/10/2022 0938   MONOABS 400 05/23/2016 1110   EOSABS 0.1 10/10/2022 0938   BASOSABS 0.1 10/10/2022 0938   Recent Labs    10/10/22 0938  HGB 12.1    CMP     Component Value Date/Time   NA 138 10/10/2022 0938   K 4.4 10/10/2022 0938   CL 100 10/10/2022 0938   CO2 25 10/10/2022 0938   GLUCOSE 91 10/10/2022 0938   GLUCOSE 105 (H) 08/27/2018 1514   BUN 12 10/10/2022 0938   CREATININE 0.59 10/10/2022 0938   CREATININE 0.65 05/23/2016 1110   CALCIUM 8.6 (L) 10/10/2022 0938   PROT 6.5 10/10/2022 0938   ALBUMIN 4.7 10/10/2022 0938   AST 19 10/10/2022 0938   ALT 15 10/10/2022 0938   ALKPHOS 73 10/10/2022 0938   BILITOT <0.2 10/10/2022 0938   GFRNONAA 90 12/18/2019 0000   GFRAA 104 12/18/2019 0000      Latest Ref Rng & Units 10/10/2022    9:38 AM 06/03/2021    3:41 PM 09/10/2020    9:10 AM  Hepatic Function  Total Protein 6.0 - 8.5 g/dL 6.5  6.4  6.5   Albumin 3.8 - 4.8 g/dL 4.7  4.7  4.6   AST 0 - 40 IU/L 19  19  22    ALT 0 - 32 IU/L 15  19  19    Alk Phosphatase 44 - 121 IU/L 73  82  81   Total Bilirubin 0.0 - 1.2 mg/dL <1.9  0.2  0.3       Current Medications:   Current Outpatient Medications (Endocrine & Metabolic):    denosumab (PROLIA) 60 MG/ML SOSY injection, Inject 60 mg into the skin every 6 (six) months.  Current Facility-Administered Medications (Endocrine & Metabolic):    [START ON 05/03/2023] denosumab (PROLIA) injection 60 mg  Current Outpatient Medications (Cardiovascular):    amLODipine (NORVASC) 5 MG tablet, Take 1 tablet (5 mg total) by  mouth daily.   diltiazem 2 % GEL*, Using your index finger, apply a small amount of medication inside the rectum up to your first knuckle/joint twice daily x 8 weeks.   Current Outpatient Medications (Respiratory):    albuterol (VENTOLIN HFA) 108 (90 Base) MCG/ACT inhaler, TAKE 2 PUFFS BY MOUTH EVERY 4 HOURS AS NEEDED   FLOVENT HFA 110 MCG/ACT inhaler, Inhale 2 puffs into the lungs 2 (two) times daily.   Current Outpatient Medications (Analgesics):    HYDROcodone-acetaminophen (NORCO/VICODIN) 5-325 MG tablet, Take 0.5 tablets by mouth every 6 (six) hours as needed for moderate pain.     Current Outpatient Medications (Other):    b complex vitamins capsule, Take 1 capsule by mouth daily.   Calcium Carbonate-Vitamin D (CALCIUM PLUS VITAMIN D PO), Take 2 tablets by mouth daily.   Cholecalciferol (VITAMIN D) 125 MCG (5000 UT) CAPS, Take 1 capsule by mouth daily.   clobetasol ointment (TEMOVATE) 0.05 %, Apply a small amount topically BID x 2 weeks as needed.   diltiazem 2 % GEL*, Using your index finger, apply a small amount of medication inside the rectum up to your first knuckle/joint twice daily x 8 weeks.   estradiol (ESTRACE VAGINAL) 0.1 MG/GM vaginal cream,  Apply 1/2 gram to vulva nightly for 2 weeks then decrease to 1/2 gram to vulva two nights a week.   famotidine (PEPCID) 40 MG tablet, TAKE 1 TABLET BY MOUTH TWICE A DAY   lamoTRIgine (LAMICTAL) 150 MG tablet, Take 150 mg by mouth at bedtime.   Liniments (SALONPAS PAIN RELIEF PATCH EX), Place 1 patch onto the skin daily as needed (pain).   Multiple Vitamins-Minerals (CENTRUM SILVER 50+WOMEN PO), Take 1 tablet by mouth.   Multiple Vitamins-Minerals (HAIR SKIN AND NAILS FORMULA PO), Take 3 capsules by mouth daily.   nystatin cream (MYCOSTATIN), Apply 1 Application topically 2 (two) times daily. On rectum   tretinoin (RETIN-A) 0.025 % cream, APPLY TO AFFECTED AREA AT BEDTIME   zolpidem (AMBIEN) 10 MG tablet, Take 2.5 mg by mouth at  bedtime.   * These medications belong to multiple therapeutic classes and are listed under each applicable group.  Medical History:  Past Medical History:  Diagnosis Date   Anal fissure    Anxiety    sees [facility] [place], [place])   Asthma    Colon polyps 08/2014   tubular adenoma and hyperplastic polyps (Dr. Rhea Belton)   Cystocele with rectocele 2023   DDD (degenerative disc disease), cervical dr Noel Gerold   s/p surgery, ongoing pain   Depression    h/o hospitalization with ECT   Diverticulosis    Esophagitis    Hiatal hernia    History of insomnia    Hypertension    Internal hemorrhoids    Neck pain    Osteopenia stopped fosamax ~2006   Post-operative nausea and vomiting    Vertigo    Vitamin D deficiency borderline(28)-2010   treated by her GYN   Allergies:  Allergies  Allergen Reactions   Codeine Other (See Comments)    Other Reaction(s): "goes crazy"  "goes crazy"   Cymbalta [Duloxetine Hcl]     Abdominal pain   Nitrofurantoin [Macrodantin]    Sulfamethoxazole Hives   Cefdinir Rash     Surgical History:  She  has a past surgical history that includes Neck surgery (02/2010([facility])1/03,2005([facility])); LASIK; Nasal septoplasty w/ turbinoplasty (1986); Knee arthroscopy (Right); Total hip arthroplasty (Left, 08/2007); Hemorrhoid surgery (2006, 12/2014); Reduction mammaplasty (Bilateral, 07/24/2008); Cataract extraction, bilateral (Bilateral, Sept/October 2019); Total shoulder arthroplasty (Left, 08/30/2018); Tonsillectomy; Hemorrhoid banding (12/2020); and Rectocele repair (04/04/2022). Family History:  Her family history includes Arthritis in her mother; Breast cancer in her cousin; Depression in her brother and mother; Diabetes in her paternal uncle; Heart attack in her father; Heart disease (age of onset: 54) in her father; Pernicious anemia in her mother.  REVIEW OF SYSTEMS  : All other systems reviewed and negative except where noted in the History of Present  Illness.  PHYSICAL EXAM: LMP 08/15/1988 (Exact Date)  General Appearance: Well nourished, in no apparent distress. Head:   Normocephalic and atraumatic. Eyes:  sclerae anicteric,conjunctive pink  Respiratory: Respiratory effort normal, BS equal bilaterally without rales, rhonchi, wheezing. Cardio: RRR with no MRGs. Peripheral pulses intact.  Abdomen: Soft,  Non-distended ,active bowel sounds. No tenderness . Without guarding and Without rebound. No masses. RECTAL: Fissure identified, tender upon examination. Presence of hemorrhoid noted, tender upon palpation. Significant amount of hard stool present. SKIN: Rash present, suggestive of yeast infection.  Musculoskeletal: Full ROM, Normal gait. Without edema. Skin:  Dry and intact without significant lesions or rashes Neuro: Alert and  oriented x4;  No focal deficits. Psych:  Cooperative. Normal mood and affect.   Physical Exam  Doree Albee, PA-C 11:34 AM

## 2023-03-24 ENCOUNTER — Ambulatory Visit: Payer: PPO | Admitting: Physician Assistant

## 2023-03-29 DIAGNOSIS — L570 Actinic keratosis: Secondary | ICD-10-CM | POA: Diagnosis not present

## 2023-03-29 DIAGNOSIS — X32XXXD Exposure to sunlight, subsequent encounter: Secondary | ICD-10-CM | POA: Diagnosis not present

## 2023-04-05 ENCOUNTER — Other Ambulatory Visit (HOSPITAL_COMMUNITY): Payer: Self-pay

## 2023-04-06 DIAGNOSIS — M9901 Segmental and somatic dysfunction of cervical region: Secondary | ICD-10-CM | POA: Diagnosis not present

## 2023-04-06 DIAGNOSIS — M9902 Segmental and somatic dysfunction of thoracic region: Secondary | ICD-10-CM | POA: Diagnosis not present

## 2023-04-06 DIAGNOSIS — S29012A Strain of muscle and tendon of back wall of thorax, initial encounter: Secondary | ICD-10-CM | POA: Diagnosis not present

## 2023-04-06 DIAGNOSIS — S161XXA Strain of muscle, fascia and tendon at neck level, initial encounter: Secondary | ICD-10-CM | POA: Diagnosis not present

## 2023-04-07 ENCOUNTER — Ambulatory Visit (INDEPENDENT_AMBULATORY_CARE_PROVIDER_SITE_OTHER): Payer: PPO | Admitting: Nurse Practitioner

## 2023-04-07 ENCOUNTER — Encounter: Payer: Self-pay | Admitting: Nurse Practitioner

## 2023-04-07 VITALS — BP 134/82 | HR 64 | Wt 130.2 lb

## 2023-04-07 DIAGNOSIS — S161XXA Strain of muscle, fascia and tendon at neck level, initial encounter: Secondary | ICD-10-CM

## 2023-04-07 DIAGNOSIS — M5030 Other cervical disc degeneration, unspecified cervical region: Secondary | ICD-10-CM | POA: Diagnosis not present

## 2023-04-07 DIAGNOSIS — L0390 Cellulitis, unspecified: Secondary | ICD-10-CM | POA: Diagnosis not present

## 2023-04-07 DIAGNOSIS — M19112 Post-traumatic osteoarthritis, left shoulder: Secondary | ICD-10-CM

## 2023-04-07 MED ORDER — PREDNISONE 20 MG PO TABS: ORAL_TABLET | ORAL | 0 refills | Status: DC

## 2023-04-07 MED ORDER — CELECOXIB 100 MG PO CAPS: 100.0000 mg | ORAL_CAPSULE | Freq: Two times a day (BID) | ORAL | 2 refills | Status: DC

## 2023-04-07 MED ORDER — METHOCARBAMOL 750 MG PO TABS: 750.0000 mg | ORAL_TABLET | Freq: Three times a day (TID) | ORAL | 1 refills | Status: DC | PRN

## 2023-04-07 MED ORDER — DOXYCYCLINE HYCLATE 100 MG PO TABS: 100.0000 mg | ORAL_TABLET | Freq: Two times a day (BID) | ORAL | 0 refills | Status: DC

## 2023-04-07 NOTE — Assessment & Plan Note (Signed)
 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 Increased pain and swelling since January, likely exacerbated by cold weather. Discussed that anti-inflammatories like Celebrex  can be effective for long-term management. - Consider Celebrex  for long-term management after completing the prednisone  taper

## 2023-04-07 NOTE — Assessment & Plan Note (Signed)
 Recent removal of skin lesions with subsequent soreness and drainage. Persistent soreness and drainage raise concerns for potential cellulitis. Discussed benefits of a short course of antibiotics to prevent cellulitis and promote healing. - Prescribe doxycycline  twice daily for 5 days - Continue mupirocin application twice daily - Cover the affected area with gauze to protect and keep mupirocin in place

## 2023-04-07 NOTE — Patient Instructions (Signed)
 I have sent in the prednisone  for you to take to help with pain and inflammation. This may make you feel a little amped up.  Once you finish with that you can start the celebrex  twice a day. This is for arthritis pain. If this works well, we can keep this up.  I have also sent in the Robaxin  for you for spasms. Take this only as needed. This may make you sleepy so be careful when taking this and dont take at the same time as the pain medication.   I have sent in doxycycline  for the hand and leg wounds. You can continue to use the mupirocin twice a day on these areas.

## 2023-04-07 NOTE — Assessment & Plan Note (Signed)
 Chronic back pain exacerbated by cold weather, with a history of multiple surgeries and nerve damage. Pain is primarily on the left side in the upper back, with associated muscle spasms and swelling. Hydrocodone  provides limited relief. I do not recommend this as a chronic therapy for this type of pain, but given that she has been on this for many years, if she wishes to stop, a taper will be required. Previous therapies include chiropractic care, acupuncture, injection, and massage. Discussed potential benefits of muscle relaxers and steroids for acute symptoms and inflammation. Explained that steroids can provide fast relief but may cause hyperactivity. Celebrex  is well-tolerated and effective for long-term inflammation management. - Prescribe methocarbamol  every 8 hours as needed for muscle spasms. Patient aware not to take with narcotic pain medication.  - Prescribe prednisone  taper to reduce inflammation - Consider Celebrex  for ongoing management after completing the prednisone  taper

## 2023-04-07 NOTE — Progress Notes (Signed)
 Camie FORBES Doing, DNP, AGNP-c [facility] [address] [place], [place] [address] [phone]   ACUTE VISIT- ESTABLISHED PATIENT  Blood pressure 134/82, pulse 64, weight 130 lb 3.2 oz (59.1 kg), last menstrual period 08/15/1988.  Subjective:  HPI [name] is a 78 y.o. female presents to day for evaluation of acute concern(s).   History of Present Illness [name] is a 78 year old female with chronic back pain who presents with worsening upper back/neck pain and concerns about arthritis.  She has been experiencing worsening back pain for about a month, primarily on the left side of her upper back/cervical spine, with associated swelling and muscle spasms. She attributes the exacerbation to cold weather and suspects arthritis, given her history of osteoarthritis in her hand. Her back pain has been a long-standing issue since age 68 following a fall, leading to several surgeries without relief. She is concerned about managing her pain during an upcoming trip to [facility].  For pain management, she has been using hydrocodone , taking a quarter of a tablet every six hours as needed, but finds it ineffective. She reports being on pain management with this medication for about 20 years. She has also tried over-the-counter medications like Advil and Aleve without success. She previously consulted with a neurologist, who indicated no further surgical options were available (she has undergone three surgeries of the spine). She has undergone physical therapy and received a pain management injection about seven months ago, though she cannot recall its effectiveness. Despite her pain, she remains active, participating in chair yoga and senior fitness classes three times a week, but is cautious with shoulder movements due to a past shoulder replacement.  Additionally, she had some skin lesions removed a week ago, which have been painful and sore. She has noted that the area on her left  hand and left posterior ankle appear to be more irritated and swollen and not healing. She has been using mupirocin on the affected areas, which has provided some relief, but is concerned about potential infection as one area has drainage.  ROS negative except for what is listed in HPI. History, Medications, Surgery, SDOH, and Family History reviewed and updated as appropriate.  Objective:  Physical Exam Vitals and nursing note reviewed.  Constitutional:      General: She is not in acute distress.    Appearance: Normal appearance. She is normal weight.  Neck:     Vascular: No carotid bruit.     Comments: Significant limitations in cervical spine ROM. Specifically lateral rotation.  Musculoskeletal:        General: Swelling and tenderness present.       Arms:     Cervical back: Tenderness present.  Lymphadenopathy:     Cervical: No cervical adenopathy.  Skin:    General: Skin is warm and dry.     Capillary Refill: Capillary refill takes less than 2 seconds.  Neurological:     General: No focal deficit present.     Mental Status: She is alert and oriented to person, place, and time.     Motor: Weakness present.  Psychiatric:        Mood and Affect: Mood normal.        Behavior: Behavior normal.    2cm wound left hand 2.5cm wound left posterior calf.     Assessment & Plan:   Problem List Items Addressed This Visit     Osteoarthritis of left shoulder   Arthritis in hands and spine with severe  foraminal encroachment due to bony spurs. Increased pain and swelling since January, likely exacerbated by cold weather. Discussed that anti-inflammatories like Celebrex  can be effective for long-term management. - Consider Celebrex  for long-term management after completing the prednisone  taper      Relevant Medications   predniSONE  (DELTASONE ) 20 MG tablet   methocarbamol  (ROBAXIN -750) 750 MG tablet   celecoxib  (CELEBREX ) 100 MG capsule   Cellulitis   Recent removal of skin lesions  with subsequent soreness and drainage. Persistent soreness and drainage raise concerns for potential cellulitis. Discussed benefits of a short course of antibiotics to prevent cellulitis and promote healing. - Prescribe doxycycline  twice daily for 5 days - Continue mupirocin application twice daily - Cover the affected area with gauze to protect and keep mupirocin in place      Relevant Medications   doxycycline  (VIBRA -TABS) 100 MG tablet   Strain of cervical portion of left trapezius muscle   Relevant Medications   predniSONE  (DELTASONE ) 20 MG tablet   methocarbamol  (ROBAXIN -750) 750 MG tablet   Degenerative disc disease, cervical - Primary   Chronic back pain exacerbated by cold weather, with a history of multiple surgeries and nerve damage. Pain is primarily on the left side in the upper back, with associated muscle spasms and swelling. Hydrocodone  provides limited relief. I do not recommend this as a chronic therapy for this type of pain, but given that she has been on this for many years, if she wishes to stop, a taper will be required. Previous therapies include chiropractic care, acupuncture, injection, and massage. Discussed potential benefits of muscle relaxers and steroids for acute symptoms and inflammation. Explained that steroids can provide fast relief but may cause hyperactivity. Celebrex  is well-tolerated and effective for long-term inflammation management. - Prescribe methocarbamol  every 8 hours as needed for muscle spasms. Patient aware not to take with narcotic pain medication.  - Prescribe prednisone  taper to reduce inflammation - Consider Celebrex  for ongoing management after completing the prednisone  taper      Relevant Medications   predniSONE  (DELTASONE ) 20 MG tablet   methocarbamol  (ROBAXIN -750) 750 MG tablet   celecoxib  (CELEBREX ) 100 MG capsule      Camie FORBES Doing, DNP, AGNP-c

## 2023-04-11 ENCOUNTER — Other Ambulatory Visit: Payer: Self-pay | Admitting: Physician Assistant

## 2023-04-11 DIAGNOSIS — S161XXA Strain of muscle, fascia and tendon at neck level, initial encounter: Secondary | ICD-10-CM | POA: Diagnosis not present

## 2023-04-11 DIAGNOSIS — M9902 Segmental and somatic dysfunction of thoracic region: Secondary | ICD-10-CM | POA: Diagnosis not present

## 2023-04-11 DIAGNOSIS — S29012A Strain of muscle and tendon of back wall of thorax, initial encounter: Secondary | ICD-10-CM | POA: Diagnosis not present

## 2023-04-11 DIAGNOSIS — K219 Gastro-esophageal reflux disease without esophagitis: Secondary | ICD-10-CM

## 2023-04-11 DIAGNOSIS — M9901 Segmental and somatic dysfunction of cervical region: Secondary | ICD-10-CM | POA: Diagnosis not present

## 2023-04-11 DIAGNOSIS — K209 Esophagitis, unspecified without bleeding: Secondary | ICD-10-CM

## 2023-04-18 ENCOUNTER — Telehealth: Payer: Self-pay

## 2023-04-18 NOTE — Telephone Encounter (Signed)
 Prolia VOB initiated via [email or web address]  Next Prolia inj DUE: 05/02/23

## 2023-04-24 ENCOUNTER — Other Ambulatory Visit (HOSPITAL_COMMUNITY): Payer: Self-pay

## 2023-04-24 NOTE — Telephone Encounter (Signed)
 Pt ready for scheduling for PROLIA on or after : 05/02/23  Out-of-pocket cost due at time of visit: $332  Number of injection/visits approved: ---  Primary: [employer] Prolia co-insurance: 20% Admin fee co-insurance: 0%  Secondary: --- Prolia co-insurance:  Admin fee co-insurance:   Medical Benefit Details: Date Benefits were checked: 04/21/23 Deductible: NO/ Coinsurance: 20%/ Admin Fee: 0%  Prior Auth: N/A PA# Expiration Date:   # of doses approved:  Pharmacy benefit: Copay $250 If patient wants fill through the pharmacy benefit please send prescription to: [employer] [facility], and include estimated need by date in rx notes. Pharmacy will ship medication directly to the office.  Patient NOT eligible for Prolia Copay Card. Copay Card can make patient's cost as little as $25. Link to apply: [email or web address]  ** This summary of benefits is an estimation of the patient's out-of-pocket cost. Exact cost may very based on individual plan coverage.

## 2023-04-25 ENCOUNTER — Other Ambulatory Visit: Payer: Self-pay | Admitting: Internal Medicine

## 2023-04-26 ENCOUNTER — Other Ambulatory Visit: Payer: Self-pay

## 2023-04-28 ENCOUNTER — Other Ambulatory Visit: Payer: Self-pay

## 2023-04-28 ENCOUNTER — Telehealth: Payer: Self-pay | Admitting: *Deleted

## 2023-04-28 DIAGNOSIS — M810 Age-related osteoporosis without current pathological fracture: Secondary | ICD-10-CM

## 2023-04-28 NOTE — Telephone Encounter (Signed)
 Copied from CRM [id number]. Topic: Clinical - Medication Question >> Apr 28, 2023 12:43 PM Victorino Dike T wrote: Reason for CRM: need to know when she is going to get the next prolia shot [phone]

## 2023-05-01 ENCOUNTER — Other Ambulatory Visit (HOSPITAL_COMMUNITY): Payer: Self-pay

## 2023-05-01 ENCOUNTER — Other Ambulatory Visit: Payer: Self-pay

## 2023-05-01 ENCOUNTER — Ambulatory Visit: Payer: Self-pay | Admitting: Family Medicine

## 2023-05-01 DIAGNOSIS — M810 Age-related osteoporosis without current pathological fracture: Secondary | ICD-10-CM

## 2023-05-01 MED ORDER — DENOSUMAB 60 MG/ML ~~LOC~~ SOSY
60.0000 mg | PREFILLED_SYRINGE | SUBCUTANEOUS | 0 refills | Status: DC
  Filled 2023-05-01 – 2023-05-02 (×2): qty 1, 180d supply, fill #0

## 2023-05-01 NOTE — Telephone Encounter (Signed)
 [phone]

## 2023-05-01 NOTE — Telephone Encounter (Signed)
 Prolia is $250 copay sent to [facility]. Pt will need to call [facility] to pay copay so then they can send the medication over.  Patient can be scheduled for next week as a nurse visit if she calls today to pay copay so they can send medication   Patient will call back when she gets home.

## 2023-05-01 NOTE — Telephone Encounter (Signed)
 Copied from CRM [id number]. Topic: Clinical - Medication Question >> May 01, 2023  1:01 PM Everette C wrote: Reason for CRM: Marcy Salvo with [facility] would like to speak with a member of clinical staff regarding adjustment of the patient's prescription for Rx #: [id number]  denosumab (PROLIA) 60 MG/ML SOSY injection [[id number]]   Chief Complaint: Medication Issue   Disposition: [] ED /[] Urgent Care (no appt availability in office) / [] Appointment(In office/virtual)/ []  [facility]/ [] Home Care/ [] Refused Recommended Disposition /[] [facility]/ [x]  Follow-up with PCP Additional Notes: Marcy Salvo at [facility] called regarding prescription order information   Call back number: [phone]  Verification of the order: Authorization to Change the quantity dispense from 60 mL to 1 mL or can we send a brand new prescription of 1 mL altogether of the brand PROLIA.   Contacted [facility] to speak with clinical team for verification, at lunch during this time, PCP office to contact pharmacy regarding clarification.

## 2023-05-01 NOTE — Telephone Encounter (Signed)
 The pharmacy called and states for the patients prolia prescription it needs to be changed to per 60MG . She is scheduled to get her shot Monday 05/08/23.

## 2023-05-01 NOTE — Telephone Encounter (Signed)
 The message is asking for disp #1 mL, instead of 60 mL. Looking at the prescription that Sabrina sent in 3/3, she wrote dispense 60 mL (instead of 1 mL).  I left a detailed message (it was after hours) authorizing the change to dispense 1mL, and left our phone number to call back if there are additional issues.

## 2023-05-02 ENCOUNTER — Other Ambulatory Visit: Payer: Self-pay

## 2023-05-02 NOTE — Telephone Encounter (Signed)
 My mistake it was meant to be sent in for 1ml and not 60mg 

## 2023-05-02 NOTE — Progress Notes (Signed)
 Specialty Pharmacy Refill Coordination Note  [name] is a 78 y.o. female contacted today regarding refills of specialty medication(s) Denosumab (PROLIA)   Patient requested Courier to Provider Office   Delivery date: 05/03/23   Verified address: [facility] [place] [address]   Medication will be filled on 05/02/23.

## 2023-05-03 ENCOUNTER — Telehealth: Payer: Self-pay | Admitting: *Deleted

## 2023-05-03 DIAGNOSIS — S29012A Strain of muscle and tendon of back wall of thorax, initial encounter: Secondary | ICD-10-CM | POA: Diagnosis not present

## 2023-05-03 DIAGNOSIS — M9901 Segmental and somatic dysfunction of cervical region: Secondary | ICD-10-CM | POA: Diagnosis not present

## 2023-05-03 DIAGNOSIS — M9902 Segmental and somatic dysfunction of thoracic region: Secondary | ICD-10-CM | POA: Diagnosis not present

## 2023-05-03 DIAGNOSIS — S161XXA Strain of muscle, fascia and tendon at neck level, initial encounter: Secondary | ICD-10-CM | POA: Diagnosis not present

## 2023-05-03 NOTE — Telephone Encounter (Signed)
 No, I don't do these. I'm sure that many of the pain docs that she has seen in the past does them.  Or sports medicine might.

## 2023-05-03 NOTE — Telephone Encounter (Signed)
 This came over in CRM:  Communication  Reason for CRM: pt's chiropractor, Dr Ernestina Patches states pt has a big muscle in her L shoulder that needs a trigger injection into the muscle.  Pt wants to know if Dr Lynelle Doctor will do this for her. She said she can come any day, but will be there on Monday for a prolia injection.               Please advise.

## 2023-05-03 NOTE — Telephone Encounter (Signed)
 Patient advised, she said she will call her pain doctor.

## 2023-05-04 ENCOUNTER — Other Ambulatory Visit: Payer: Self-pay

## 2023-05-08 ENCOUNTER — Telehealth: Payer: Self-pay | Admitting: Internal Medicine

## 2023-05-08 ENCOUNTER — Other Ambulatory Visit

## 2023-05-08 DIAGNOSIS — S161XXA Strain of muscle, fascia and tendon at neck level, initial encounter: Secondary | ICD-10-CM | POA: Diagnosis not present

## 2023-05-08 DIAGNOSIS — M9902 Segmental and somatic dysfunction of thoracic region: Secondary | ICD-10-CM | POA: Diagnosis not present

## 2023-05-08 DIAGNOSIS — M810 Age-related osteoporosis without current pathological fracture: Secondary | ICD-10-CM

## 2023-05-08 DIAGNOSIS — M9901 Segmental and somatic dysfunction of cervical region: Secondary | ICD-10-CM | POA: Diagnosis not present

## 2023-05-08 DIAGNOSIS — S29012A Strain of muscle and tendon of back wall of thorax, initial encounter: Secondary | ICD-10-CM | POA: Diagnosis not present

## 2023-05-08 NOTE — Telephone Encounter (Signed)
 Pt states that she feels Celebrex is not working for her for her Arthritis. She wants to know, does it take a while to get into your system, or does she need something else. She is still having some joint pain  Will send to both Sarabeth who saw patient and pcp

## 2023-05-09 DIAGNOSIS — M47812 Spondylosis without myelopathy or radiculopathy, cervical region: Secondary | ICD-10-CM | POA: Diagnosis not present

## 2023-05-09 DIAGNOSIS — Z79899 Other long term (current) drug therapy: Secondary | ICD-10-CM | POA: Diagnosis not present

## 2023-05-09 DIAGNOSIS — Z6821 Body mass index (BMI) 21.0-21.9, adult: Secondary | ICD-10-CM | POA: Diagnosis not present

## 2023-05-09 DIAGNOSIS — Z79891 Long term (current) use of opiate analgesic: Secondary | ICD-10-CM | POA: Diagnosis not present

## 2023-05-09 DIAGNOSIS — M4322 Fusion of spine, cervical region: Secondary | ICD-10-CM | POA: Diagnosis not present

## 2023-05-09 DIAGNOSIS — G894 Chronic pain syndrome: Secondary | ICD-10-CM | POA: Diagnosis not present

## 2023-05-09 NOTE — Telephone Encounter (Signed)
 Her visit was over a month ago with [name]. The Celebrex can take a few days/week, not this long. At this point, she can schedule a follow-up if she wants to discuss other medications, but she has seen many pain doctors in the past, and specialists, so I'm not necessarily saying she needs to f/u with [place].

## 2023-05-09 NOTE — Telephone Encounter (Signed)
 Pt was notified and she states she will just stop taking it. She thinks its giving her heart burn

## 2023-05-15 DIAGNOSIS — M47812 Spondylosis without myelopathy or radiculopathy, cervical region: Secondary | ICD-10-CM | POA: Diagnosis not present

## 2023-05-15 DIAGNOSIS — M542 Cervicalgia: Secondary | ICD-10-CM | POA: Diagnosis not present

## 2023-05-15 DIAGNOSIS — M791 Myalgia, unspecified site: Secondary | ICD-10-CM | POA: Diagnosis not present

## 2023-05-30 DIAGNOSIS — M5412 Radiculopathy, cervical region: Secondary | ICD-10-CM | POA: Diagnosis not present

## 2023-06-09 DIAGNOSIS — M47812 Spondylosis without myelopathy or radiculopathy, cervical region: Secondary | ICD-10-CM | POA: Diagnosis not present

## 2023-06-09 DIAGNOSIS — M791 Myalgia, unspecified site: Secondary | ICD-10-CM | POA: Diagnosis not present

## 2023-06-09 DIAGNOSIS — M542 Cervicalgia: Secondary | ICD-10-CM | POA: Diagnosis not present

## 2023-06-14 NOTE — Progress Notes (Unsigned)
 No chief complaint on file.  Patient presents to discuss her fatigue/sleepiness. Requesting labs. Last cbc and c-met were 09/2022. Last thyroid screening was 08/2020   She has chronic pain in low back and neck/shoulders.  She underwent C7-T1 interlaminar epidural steroid injection, without much reduction in her pain.    PMH, PSH, SH reviewed   ROS:    PHYSICAL EXAM:  LMP 08/15/1988 (Exact Date)   Wt Readings from Last 3 Encounters:  04/07/23 130 lb 3.2 oz (59.1 kg)  02/15/23 128 lb (58.1 kg)  01/12/23 126 lb (57.2 kg)       ASSESSMENT/PLAN:

## 2023-06-15 ENCOUNTER — Ambulatory Visit (INDEPENDENT_AMBULATORY_CARE_PROVIDER_SITE_OTHER): Admitting: Family Medicine

## 2023-06-15 ENCOUNTER — Encounter: Payer: Self-pay | Admitting: Family Medicine

## 2023-06-15 VITALS — BP 120/80 | HR 92 | Temp 98.5°F | Wt 131.2 lb

## 2023-06-15 DIAGNOSIS — M542 Cervicalgia: Secondary | ICD-10-CM

## 2023-06-15 DIAGNOSIS — Z5181 Encounter for therapeutic drug level monitoring: Secondary | ICD-10-CM | POA: Diagnosis not present

## 2023-06-15 DIAGNOSIS — E559 Vitamin D deficiency, unspecified: Secondary | ICD-10-CM | POA: Diagnosis not present

## 2023-06-15 DIAGNOSIS — M545 Low back pain, unspecified: Secondary | ICD-10-CM

## 2023-06-15 DIAGNOSIS — G8929 Other chronic pain: Secondary | ICD-10-CM

## 2023-06-15 DIAGNOSIS — R5383 Other fatigue: Secondary | ICD-10-CM

## 2023-06-15 DIAGNOSIS — F330 Major depressive disorder, recurrent, mild: Secondary | ICD-10-CM | POA: Diagnosis not present

## 2023-06-16 LAB — TSH: TSH: 1.51 u[IU]/mL (ref 0.450–4.500)

## 2023-06-16 LAB — CBC WITH DIFFERENTIAL/PLATELET
Basophils Absolute: 0 10*3/uL (ref 0.0–0.2)
Basos: 0 %
EOS (ABSOLUTE): 0.1 10*3/uL (ref 0.0–0.4)
Eos: 2 %
Hematocrit: 35.5 % (ref 34.0–46.6)
Hemoglobin: 11.9 g/dL (ref 11.1–15.9)
Immature Grans (Abs): 0 10*3/uL (ref 0.0–0.1)
Immature Granulocytes: 0 %
Lymphocytes Absolute: 1.6 10*3/uL (ref 0.7–3.1)
Lymphs: 24 %
MCH: 29.8 pg (ref 26.6–33.0)
MCHC: 33.5 g/dL (ref 31.5–35.7)
MCV: 89 fL (ref 79–97)
Monocytes Absolute: 0.5 10*3/uL (ref 0.1–0.9)
Monocytes: 8 %
Neutrophils Absolute: 4.4 10*3/uL (ref 1.4–7.0)
Neutrophils: 66 %
Platelets: 330 10*3/uL (ref 150–450)
RBC: 3.99 x10E6/uL (ref 3.77–5.28)
RDW: 12.3 % (ref 11.7–15.4)
WBC: 6.7 10*3/uL (ref 3.4–10.8)

## 2023-06-16 LAB — COMPREHENSIVE METABOLIC PANEL WITH GFR
ALT: 18 IU/L (ref 0–32)
AST: 16 IU/L (ref 0–40)
Albumin: 4.2 g/dL (ref 3.8–4.8)
Alkaline Phosphatase: 68 IU/L (ref 44–121)
BUN/Creatinine Ratio: 31 — ABNORMAL HIGH (ref 12–28)
BUN: 22 mg/dL (ref 8–27)
Bilirubin Total: 0.2 mg/dL (ref 0.0–1.2)
CO2: 23 mmol/L (ref 20–29)
Calcium: 9.2 mg/dL (ref 8.7–10.3)
Chloride: 99 mmol/L (ref 96–106)
Creatinine, Ser: 0.7 mg/dL (ref 0.57–1.00)
Globulin, Total: 1.6 g/dL (ref 1.5–4.5)
Glucose: 138 mg/dL — ABNORMAL HIGH (ref 70–99)
Potassium: 3.8 mmol/L (ref 3.5–5.2)
Sodium: 137 mmol/L (ref 134–144)
Total Protein: 5.8 g/dL — ABNORMAL LOW (ref 6.0–8.5)
eGFR: 88 mL/min/{1.73_m2} (ref >59)

## 2023-06-16 LAB — VITAMIN D 25 HYDROXY (VIT D DEFICIENCY, FRACTURES): Vit D, 25-Hydroxy: 56.8 ng/mL (ref 30.0–100.0)

## 2023-06-16 NOTE — Progress Notes (Signed)
 Advise pt of lab results.  Glucose was elevated, but she wasn't fasting.  Kidney function was normal. The BUN/Cr ratio was elevated, which suggests that she might not have been well hydrated.  Encourage her to drink plenty of water (aiming for very pale urine).  Drinking more water also helps with the bowels. This could make someone feel a little weak. Her electrolytes and liver tests were normal. Blood counts were normal, no anemia. Thyroid  function and vitamin D  levels are normal. There is nothing in the labwork to explain her fatigue.  We discussed that her fatigue got much worse since her pain got much worse, and that she has been taking the hydrocodone  twice a day, whereas she used to take it just once a day. This might be a factor (since the medication can be sedating). We had also discussed that chronic pain can cause depression, which can manifest as fatigue, lack of motivation. She reported not feeling depressed, but if things aren't improving, she should talk further about this with her psychiatrist. In the meantime, work on staying well hydrated. I'm hoping the new doctor she is scheduled to see can help her with her pain.

## 2023-06-27 DIAGNOSIS — M5030 Other cervical disc degeneration, unspecified cervical region: Secondary | ICD-10-CM | POA: Diagnosis not present

## 2023-06-27 DIAGNOSIS — M62838 Other muscle spasm: Secondary | ICD-10-CM | POA: Diagnosis not present

## 2023-06-27 DIAGNOSIS — M47812 Spondylosis without myelopathy or radiculopathy, cervical region: Secondary | ICD-10-CM | POA: Diagnosis not present

## 2023-06-27 DIAGNOSIS — Z981 Arthrodesis status: Secondary | ICD-10-CM | POA: Diagnosis not present

## 2023-06-27 DIAGNOSIS — M542 Cervicalgia: Secondary | ICD-10-CM | POA: Diagnosis not present

## 2023-06-30 ENCOUNTER — Other Ambulatory Visit: Payer: Self-pay | Admitting: Internal Medicine

## 2023-06-30 NOTE — Progress Notes (Signed)
 Ordered prolia  and [facility] to [facility] going forward

## 2023-07-10 ENCOUNTER — Telehealth: Payer: Self-pay | Admitting: Pharmacy Technician

## 2023-07-10 NOTE — Telephone Encounter (Signed)
 Auth Submission: NO AUTH NEEDED Site of care: Site of care: [facility] Payer: [employer] Medication & CPT/J Code(s) submitted: Prolia  (Denosumab ) [id number] Route of submission (phone, fax, portal):  Phone # Fax # Auth type: Buy/Bill PB Units/visits requested: 60MG  Q6 MONTHS X 2 DOSES Reference number:  Approval from: 07/10/23 to 02/28/24

## 2023-07-12 DIAGNOSIS — M25519 Pain in unspecified shoulder: Secondary | ICD-10-CM | POA: Diagnosis not present

## 2023-07-12 DIAGNOSIS — M542 Cervicalgia: Secondary | ICD-10-CM | POA: Diagnosis not present

## 2023-07-12 DIAGNOSIS — M532X2 Spinal instabilities, cervical region: Secondary | ICD-10-CM | POA: Diagnosis not present

## 2023-07-12 DIAGNOSIS — M79673 Pain in unspecified foot: Secondary | ICD-10-CM | POA: Diagnosis not present

## 2023-07-17 DIAGNOSIS — M542 Cervicalgia: Secondary | ICD-10-CM | POA: Diagnosis not present

## 2023-07-17 DIAGNOSIS — M79673 Pain in unspecified foot: Secondary | ICD-10-CM | POA: Diagnosis not present

## 2023-07-17 DIAGNOSIS — M25519 Pain in unspecified shoulder: Secondary | ICD-10-CM | POA: Diagnosis not present

## 2023-07-17 DIAGNOSIS — M532X2 Spinal instabilities, cervical region: Secondary | ICD-10-CM | POA: Diagnosis not present

## 2023-07-19 ENCOUNTER — Ambulatory Visit: Admitting: Podiatry

## 2023-07-20 DIAGNOSIS — M25519 Pain in unspecified shoulder: Secondary | ICD-10-CM | POA: Diagnosis not present

## 2023-07-20 DIAGNOSIS — M542 Cervicalgia: Secondary | ICD-10-CM | POA: Diagnosis not present

## 2023-07-20 DIAGNOSIS — M79673 Pain in unspecified foot: Secondary | ICD-10-CM | POA: Diagnosis not present

## 2023-07-20 DIAGNOSIS — M532X2 Spinal instabilities, cervical region: Secondary | ICD-10-CM | POA: Diagnosis not present

## 2023-07-26 ENCOUNTER — Encounter: Payer: Self-pay | Admitting: Podiatry

## 2023-07-26 ENCOUNTER — Ambulatory Visit: Admitting: Podiatry

## 2023-07-26 DIAGNOSIS — M7752 Other enthesopathy of left foot: Secondary | ICD-10-CM | POA: Diagnosis not present

## 2023-07-26 DIAGNOSIS — M779 Enthesopathy, unspecified: Secondary | ICD-10-CM

## 2023-07-26 MED ORDER — TRIAMCINOLONE ACETONIDE 10 MG/ML IJ SUSP
10.0000 mg | Freq: Once | INTRAMUSCULAR | Status: AC
Start: 2023-07-26 — End: 2023-07-26
  Administered 2023-07-26: 10 mg via INTRA_ARTICULAR

## 2023-07-26 NOTE — Progress Notes (Signed)
 Subjective:   Patient ID: [name], female   DOB: 78 y.o.   MRN: [id number]   HPI Patient states she started a lot of pain on top of her left foot and it has been very inflamed to get   ROS      Objective:  Physical Exam  Neurovascular status intact with exquisite tendinitis dorsal left painful     Assessment:  Tendinitis left with chronic and acute nature to it     Plan:  Sterile prep injected the dorsal tendon complex 3 mg Dexasone Kenalog  5 mg Xylocaine  used topical medicine wide shoes reappoint to recheck

## 2023-08-28 ENCOUNTER — Telehealth: Payer: Self-pay | Admitting: Physician Assistant

## 2023-08-28 MED ORDER — AMOXICILLIN-POT CLAVULANATE 875-125 MG PO TABS: 1.0000 | ORAL_TABLET | Freq: Two times a day (BID) | ORAL | 0 refills | Status: AC

## 2023-08-28 NOTE — Telephone Encounter (Signed)
 Called and informed patient that prescription has been sent to pharmacy on file. Patient knows to contact us  if symptoms not improving during treatment. I reviewed ER precautions with patient. Patient verbalized understanding and had no concerns at the end of the call.

## 2023-08-28 NOTE — Telephone Encounter (Signed)
 Inbound call from patient, would like Augmentin  prescribed for her diverticulitis. She states she went to the beach and ate fried foods and believes it may be the cause. She states she does not want to schedule an appointment but would like prescription sent in.

## 2023-09-11 ENCOUNTER — Ambulatory Visit: Payer: Self-pay

## 2023-09-11 NOTE — Telephone Encounter (Signed)
 FYI Only or Action Required?: FYI only for provider.  Patient was last seen in primary care on 06/15/2023 by Randol Dawes, MD.  Called Nurse Triage reporting Fall.  Symptoms began about a month ago.  Interventions attempted: Nothing.  Symptoms are: unchanged.  Triage Disposition: No disposition on file.  Patient/caregiver understands and will follow disposition?:    Copied from CRM [id number]. Topic: Clinical - Red Word Triage >> Sep 11, 2023 11:17 AM [name] wrote: Red Word that prompted transfer to Nurse Triage: Confusion, previously fell and hit head at [facility] 6/22. Has not been seen. Reason for Disposition  MILD weakness (e.g., does not interfere with ability to work, go to school, normal activities)  (Exception: Mild weakness is a chronic symptom.)  Answer Assessment - Initial Assessment Questions 1. MECHANISM: How did the fall happen?     08/20/23 fell and hit head on fell on steps exiting a hotel on the concrete 2. DOMESTIC VIOLENCE AND ELDER ABUSE SCREENING: Did you fall because someone pushed you or tried to hurt you? If Yes, ask: Are you safe now?     denies 3. ONSET: When did the fall happen? (e.g., minutes, hours, or days ago)     On 6/22 4. LOCATION: What part of the body hit the ground? (e.g., back, buttocks, head, hips, knees, hands, head, stomach)     Eyebrow left side 5. INJURY: Did you hurt (injure) yourself when you fell? If Yes, ask: What did you injure? Tell me more about this? (e.g., body area; type of injury; pain severity)     Left eyebrow 6. PAIN: Is there any pain? If Yes, ask: How bad is the pain? (e.g., Scale 0-10; or none, mild,      denies 7. SIZE: For cuts, bruises, or swelling, ask: How large is it? (e.g., inches or centimeters)      na 8. PREGNANCY: Is there any chance you are pregnant? When was your last menstrual period?     na 9. OTHER SYMPTOMS: Do you have any other symptoms? (e.g., dizziness, fever, weakness;  new-onset or worsening).      Confusion, states saying stuff that doesn't make sense, agitated 10. CAUSE: What do you think caused the fall (or falling)? (e.g., dizzy spell, tripped)       tripped  Protocols used: Falls and [facility]

## 2023-09-12 ENCOUNTER — Other Ambulatory Visit: Payer: Self-pay | Admitting: Nurse Practitioner

## 2023-09-12 DIAGNOSIS — S161XXA Strain of muscle, fascia and tendon at neck level, initial encounter: Secondary | ICD-10-CM

## 2023-09-12 NOTE — Telephone Encounter (Signed)
 Left message for pt to call back

## 2023-09-12 NOTE — Telephone Encounter (Signed)
 Left message for pt to call back  I see she has upcoming appointment from a fall. Not sure if she requested this or if it was an automatic request.

## 2023-09-13 ENCOUNTER — Ambulatory Visit: Admitting: Family Medicine

## 2023-09-18 ENCOUNTER — Ambulatory Visit: Admitting: Family Medicine

## 2023-09-27 DIAGNOSIS — M19072 Primary osteoarthritis, left ankle and foot: Secondary | ICD-10-CM | POA: Diagnosis not present

## 2023-10-04 ENCOUNTER — Telehealth: Payer: Self-pay | Admitting: Podiatry

## 2023-10-04 NOTE — Telephone Encounter (Signed)
 Patient left a voicemail stating she is having pain at the top of the foot.Pin started a week ago. She has been icing it and using arnica gel but no difference. She has taken arthritis tylenol  , it hasn't worked.  She asks are there any other treatments or pain medication that would help her pain?

## 2023-10-05 NOTE — Telephone Encounter (Signed)
 No answer left detailed message if is need of any further assistant call back to office.

## 2023-10-05 NOTE — Telephone Encounter (Signed)
 I don't have any new ideas beyond what Dr. Kit recommended

## 2023-10-11 ENCOUNTER — Other Ambulatory Visit: Payer: Self-pay | Admitting: Family Medicine

## 2023-10-11 DIAGNOSIS — I10 Essential (primary) hypertension: Secondary | ICD-10-CM

## 2023-10-12 DIAGNOSIS — M47812 Spondylosis without myelopathy or radiculopathy, cervical region: Secondary | ICD-10-CM | POA: Diagnosis not present

## 2023-10-12 DIAGNOSIS — M542 Cervicalgia: Secondary | ICD-10-CM | POA: Diagnosis not present

## 2023-10-12 DIAGNOSIS — Z1330 Encounter for screening examination for mental health and behavioral disorders, unspecified: Secondary | ICD-10-CM | POA: Diagnosis not present

## 2023-10-12 DIAGNOSIS — M791 Myalgia, unspecified site: Secondary | ICD-10-CM | POA: Diagnosis not present

## 2023-10-17 ENCOUNTER — Other Ambulatory Visit: Payer: Self-pay

## 2023-10-17 DIAGNOSIS — J4520 Mild intermittent asthma, uncomplicated: Secondary | ICD-10-CM | POA: Diagnosis not present

## 2023-10-17 DIAGNOSIS — J310 Chronic rhinitis: Secondary | ICD-10-CM | POA: Diagnosis not present

## 2023-10-18 NOTE — Progress Notes (Unsigned)
 No chief complaint on file.   [name] is a 78 y.o. female who presents for annual physical exam, Medicare wellness visit and follow-up on chronic medical conditions.    She is under the care of GYN, Dr. Vera Pa, and had WWE in 12/2022.   She was last seen in April with complaints of fatigue.  Nonfasting labs were done at that visit. Nonfasting glucose was elevated, BUN/Cr ratio was elevated, which suggests that she might not have been well hydrated.  Her electrolytes and liver tests were normal. Blood counts were normal, no anemia. Thyroid  function and vitamin D  levels are normal. She was encouraged to stay well hydrated.   We had discussed that her fatigue got much worse since her pain got much worse, and that she has been taking the hydrocodone  twice a day, whereas she used to take it just once a day. This might be a factor (since the medication can be sedating). We had also discussed that chronic pain can cause depression, which can manifest as fatigue, lack of motivation. She reported not feeling depressed, but if things aren't improving, she should talk further about this with her psychiatrist.  She has chronic pain in neck and pack.  She was having a lot of trapezius pain in March/April. She underwent C7-T1 interlaminar epidural steroid injection at that time through [facility] without much benefit.   She continues to see Dr. Gust and NP Elida for pain, last seen 8/14. They have refilled her hydrocodone , up to 2/d.  She had no benefit from Celebrex  (rx'd elsewhere), advised she can stop that and use Aleve up to twice daily. Robaxin  helps some. She also has some foot arthritis, sees Dr. Kit and voltaren gel helps some. TMT injection also helped (last visit 7/30, was too soon for another injection). Saw Dr. Magdalen 5/28, had an injection for tendonitis.  She saw Dr. Fleeta Smock (allergies and asthma) earlier this week. No changes were made.  Osteoporosis:  She had osteopenia  with elevated FRAX score in 11/2019 (T-2.4 at L forearm/radius, T-2.2 at R fem neck; FRAX score was 15.1% for major osteoporotic fracture, 4.6% for hip fracture (elevated). She was started on Actonel  150mg  monthly, which she tolerated. DEXA in 02/2022 showed osteoporosis, with T-2.6 at L forearm radius. T-2.1 at R femur neck. L hip and spine excluded. Treatment was switched to Prolia , with first injection in 03/2022. Last injection was 04/2023, tolerating with side effects. She is compliant with her Ca and D (taking 2 tablets equaling 1200mg , once daily).  She gets weight-bearing execises 3x/week.  Vitamin D  deficiency. She continues to take a separate vitamin D3 , 5000 IU daily.  Last level was 56.8 in 05/2023.   Component Ref Range & Units (hover) 4 mo ago (06/15/23) 1 yr ago (03/28/22) 2 yr ago (06/03/21) 4 yr ago (08/29/19) 5 yr ago (09/19/18) 5 yr ago (02/14/18) 5 yr ago (12/13/17)  Vit D, 25-Hydroxy 56.8 44.2 CM 31.1 CM 40.0 CM 35.8 CM 25.1 Low  CM 23.2 Low  CM    GERD and h/o esophagitis:  She was started on famotidine  40mg  BID after EGD in 06/2019 (showed reflux esophagitis, 4cm HH). She reports taking pepcid  once daily and doing well. She still has some mild heartburn intermittently. Uses Mylanta prn (after certain foods, about 1-2x/week). Denies chest pain or dysphagia.  Hypertension follow-up: She has been taking amlodipine  5mg  daily without any side effects. She denies any edema. BP's have been running  120/70 Denies headaches,  chest pain, palpitations, dizziness, edema.  BP Readings from Last 3 Encounters:  06/15/23 120/80  04/07/23 134/82  02/15/23 122/64     She had been diagnosed with global pelvic floor laxity, mild cystocele, mod anterior rectocele. She was referred for pelvic floor PT, and to urogynecologist.  She had surgery in 03/2022 with Dr. Alvia (rectocele repair). Diagnosed with vaginal atrophy/lichen sclerosus. Irritation/dryness improved with topical estrogen.  She  uses the estrogen once a week, the clobetasol  just prn. This was flaring last fall when she had GYN exam, treated again with clobetasol  and more frequent estradiol , and had significant improvement at f/u exam.  Currently she is using **** estradiol ??  Depression/anxiety and insomnia--sees  Olam Blackwater. Continues on Lamictal , per psych. She reports her moods are good.    Immunization History  Administered Date(s) Administered   Fluad Quad(high Dose 65+) 12/08/2021   Influenza Split 11/13/2015   Influenza, High Dose Seasonal PF 03/05/2014, 12/15/2014, 11/25/2016, 12/04/2017, 11/21/2018, 11/27/2019, 12/07/2020   Influenza,inj,quad, With Preservative 11/28/2016, 11/28/2017   Influenza-Unspecified 12/29/2012, 11/25/2016   Moderna Covid-19 Vaccine Bivalent Booster 17yrs & up 02/22/2021   PFIZER Comirnaty(Gray Top)Covid-19 Tri-Sucrose Vaccine 07/28/2020   PFIZER(Purple Top)SARS-COV-2 Vaccination 03/15/2019, 04/05/2019, 12/11/2019   PNEUMOCOCCAL CONJUGATE-20 10/10/2022   Pneumococcal Conjugate-13 07/02/2014   Pneumococcal Polysaccharide-23 08/29/2007, 07/06/2015   Tdap 08/29/2007, 12/26/2017   Zoster Recombinant(Shingrix) 12/17/2018, 02/28/2019   Zoster, Live 08/29/2007   Last Pap smear: 03/2014 with GYN, normal Last mammogram: 11/2022 Last colonoscopy: 06/2019--diverticulosis, small internal hemorrhoids, no polyps. F/u not needed (prior was in 08/2014, had tubular adenoma) Last DEXA: 02/2022 T-2.6  L forearm radius. T-2.1 at R femur neck Dentist: twice yearly Ophtho: yearly Exercise:    Goes to [employer] on Mondays (yoga, walks the track, weights), goes to 02 gym up to 3x/wk [employer]), including cardio and weights.   Hep C screen: negative, done by GYN 04/2015  Lipid screen: Lab Results  Component Value Date   CHOL 203 (H) 08/29/2019   HDL 77 08/29/2019   LDLCALC 113 (H) 08/29/2019   TRIG 70 08/29/2019   CHOLHDL 2.6 08/29/2019   Thyroid  screen: Lab Results  Component Value  Date   TSH 1.510 06/15/2023     Patient Care Team: Randol Dawes, MD as PCP - General (Family Medicine) Pyrtle, Gordy HERO, MD as Consulting Physician (Gastroenterology) Ernie Cough, MD as Consulting Physician (Orthopedic Surgery) Sheldon Standing, MD as Consulting Physician (General Surgery) Liane Sharyne MATSU, [facility] (Inactive) as Pharmacist (Pharmacist) Cleotilde Sewer, OD (Optometry) Meridee Seltzer, MD as Consulting Physician (Ophthalmology) Ivin Kocher, MD as Referring Physician (Dermatology) Regal, [place] RAMAN, DPM as Consulting Physician (Podiatry) Gust Royden ORN, MD (Orthopedic Surgery) Ernie Cough, MD as Consulting Physician (Orthopedic Surgery) Melita Drivers, MD as Consulting Physician (Orthopedic Surgery) Onetha Kuba, MD as Consulting Physician (Neurosurgery) Mindi Mt, MD (Inactive) as Referring Physician (Anesthesiology) Bonner Ade, MD as Consulting Physician (Physical Medicine and Rehabilitation) Letha Cancer, MD as Consulting Physician (Physical Medicine and Rehabilitation) Carlie Clark, MD as Consulting Physician (Otolaryngology)  UroGYN: Dr. Alvia Dentist: Dr. Carolee Ortho--Dr. Ernie (seen in the past for L hip bursitis), Dr. Melita (shoulder), Dr. Arnaldo (foot pain)--now Dr. Kit for foot Pain doctor--Dr. Laqueta (previously has seen Dr. Mindi, Dr. Bonner, Dr. Letha) Dr. Iran office prescribes her pain medications Psych: Olam Blackwater (PA at [facility]) Allergist: Dr. Fleeta Smock  Physical Therapist: previously saw Arvella Admire ([facility]), Eoin Colleran ([facility])    Depression Screening:    06/15/2023    3:42 PM 09/20/2021  1:38 PM 09/07/2020    1:51 PM 09/04/2019    1:54 PM 08/27/2018   11:27 AM  Depression screen PHQ 2/9  Decreased Interest 1 0 0 0 0  Down, Depressed, Hopeless 0 0 0 0 0  PHQ - 2 Score 1 0 0 0 0  Altered sleeping  1     Tired, decreased energy  3     Change in appetite  0     Feeling bad or failure about yourself   0     Trouble  concentrating  1     Moving slowly or fidgety/restless  0     Suicidal thoughts  0     PHQ-9 Score  5     Difficult doing work/chores  Not difficult at all       Falls screen:     01/12/2023    1:41 PM 10/10/2022    8:32 AM 12/08/2021    2:26 PM 09/20/2021    1:38 PM 06/03/2021    2:32 PM  Fall Risk   Falls in the past year? 0 0 0 0 0  Number falls in past yr: 0 0 0 0 0  Injury with Fall? 0 0 0 0 0  Risk for fall due to : No Fall Risks No Fall Risks No Fall Risks No Fall Risks No Fall Risks  Follow up Falls evaluation completed Falls evaluation completed Falls evaluation completed  Falls evaluation completed  Falls evaluation completed      Data saved with a previous flowsheet row definition     Functional Status Survey:        End of Life Discussion:  Patient has a living will and medical power of attorney (brought in 08/2018)   PMH, PSH, SH and FH were reviewed and updated     ROS:  The patient denies anorexia, fever, weight changes, headaches, vision changes, ear pain, sore throat, breast concerns, chest pain, palpitations, dizziness, syncope, dyspnea on exertion,  nausea, vomiting, diarrhea, abdominal pain, melena, hematochezia, hematuria, incontinence, dysuria, vaginal bleeding, discharge, odor or itch, numbness, tingling, weakness, suspicious skin lesions, depression, anxiety (controlled), abnormal bleeding or enlarged lymph nodes.  +hearing loss, uses hearing aids (not wearing today) *** Neck pain per HPI.   Low back pain  Occasional reflux/heartburn, related to what she eats Slight tremor on R, noted with writing only (not with eating/drinking), unchanged. Denies shortness of breath, wheezing or current allergy symptoms Constipation--requires laxative daily *** Occasional hemorrhoidal bleeding.   PHYSICAL EXAM:  LMP 08/15/1988 (Exact Date)   Wt Readings from Last 3 Encounters:  06/15/23 131 lb 3.2 oz (59.5 kg)  04/07/23 130 lb 3.2 oz (59.1 kg)  02/15/23  128 lb (58.1 kg)   Well appearing, pleasant elderly female in no distress.  HEENT: conjunctiva and sclera are clear, EOMI. TM's and EAC's normal, non-obstructive cerumen on the left.  No nasal drainage or sinus tenderness. OP is clear Neck: no lymphadenopathy, thyromegaly or mass. No spinal tenderness. WHSS. No carotid bruit.  No focal tenderness today, just slightly tender on left.  Feels a little tighter at the level of the lateral neck. *** Heart: regular rate and rhythm, no murmur Lungs: clear bilaterally, good air movement Back: no spinal or CVA tenderness, no SI tenderness. No focal tenderness today Abdomen: soft, nontender, no mass Extremities: Normal pulses, no edema Psych: normal mood, affect, hygiene, grooming, eye contact, speech.   Neuro: alert and oriented, normal gait. Breast and pelvic exams deferred to her GYN  Skin: normal turgor.  There is some healing purpura on the forearms.  ***UPDATE neck, back, skin--purpura  ASSESSMENT/PLAN:  Did she get flu shot, COVID booster or RSV vaccine?  Order dexa for 02/2024, f/u osteoporosis  C-met, lipid Update if taking pepcid  once or twice daily  [facility] refilled her amlodipine  for A YEAR LAST WEEK!!  Please talk to her about not doing this with upcoming appointments without verifying if they need it, and if so, NOT PUTTING REFILLS   Discussed monthly self breast exams and yearly mammograms; at least 30 minutes of aerobic activity at least 5 days/week, weight-bearing exercise at least 2x/wk; proper sunscreen use reviewed; healthy diet, including goals of calcium and vitamin D  intake and alcohol recommendations (less than or equal to 1 drink/day) reviewed; regular seatbelt use; changing batteries in smoke detectors, carbon monoxide detectors.  Immunization recommendations discussed--continue yearly high dose flu shots.  RSV recommended in the Fall, to get from the pharmacy.  *** Updated bivalent COVID booster in the Fall, when  available.   Colonoscopy recommendations reviewed, no longer needed.   MOST form reviewed and completed.  Full Code, Full Care.    Medicare Attestation I have personally reviewed: The patient's medical and social history Their use of alcohol, tobacco or illicit drugs Their current medications and supplements The patient's functional ability including ADLs,fall risks, home safety risks, cognitive, and hearing and visual impairment Diet and physical activities Evidence for depression or mood disorders  The patient's weight, height, BMI have been recorded in the chart.  I have made referrals, counseling, and provided education to the patient based on review of the above and I have provided the patient with a written personalized care plan for preventive services.

## 2023-10-18 NOTE — Patient Instructions (Incomplete)
 HEALTH MAINTENANCE RECOMMENDATIONS:  It is recommended that you get at least 30 minutes of aerobic exercise at least 5 days/week (for weight loss, you may need as much as 60-90 minutes). This can be any activity that gets your heart rate up. This can be divided in 10-15 minute intervals if needed, but try and build up your endurance at least once a week.  Weight bearing exercise is also recommended twice weekly.  Eat a healthy diet with lots of vegetables, fruits and fiber.  Colorful foods have a lot of vitamins (ie green vegetables, tomatoes, red peppers, etc).  Limit sweet tea, regular sodas and alcoholic beverages, all of which has a lot of calories and sugar.  Up to 1 alcoholic drink daily may be beneficial for women (unless trying to lose weight, watch sugars).  Drink a lot of water.  Calcium recommendations are 1200-1500 mg daily (1500 mg for postmenopausal women or women without ovaries), and vitamin D  1000 IU daily.  This should be obtained from diet and/or supplements (vitamins), and calcium should not be taken all at once, but in divided doses.  Monthly self breast exams and yearly mammograms for women over the age of 105 is recommended.  Sunscreen of at least SPF 30 should be used on all sun-exposed parts of the skin when outside between the hours of 10 am and 4 pm (not just when at beach or pool, but even with exercise, golf, tennis, and yard work!)  Use a sunscreen that says broad spectrum so it covers both UVA and UVB rays, and make sure to reapply every 1-2 hours.  Remember to change the batteries in your smoke detectors when changing your clock times in the spring and fall. Carbon monoxide detectors are recommended for your home.  Use your seat belt every time you are in a car, and please drive safely and not be distracted with cell phones and texting while driving.   [name] , Thank you for taking time to come for your Medicare Wellness Visit. I appreciate your ongoing  commitment to your health goals. Please review the following plan we discussed and let me know if I can assist you in the future.   This is a list of the screening recommended for you and due dates:  Health Maintenance  Topic Date Due   COVID-19 Vaccine (6 - 2024-25 season) 10/30/2022   Flu Shot  09/29/2023   Mammogram  12/21/2023   Medicare Annual Wellness Visit  10/18/2024   DTaP/Tdap/Td vaccine (3 - Td or Tdap) 12/27/2027   Pneumococcal Vaccine for age over 73  Completed   DEXA scan (bone density measurement)  Completed   Zoster (Shingles) Vaccine  Completed   HPV Vaccine  Aged Out   Meningitis B Vaccine  Aged Out   Colon Cancer Screening  Discontinued   Hepatitis C Screening  Discontinued    Air Supra is great to use as needed for wheezing or asthma flares. You do NOT use any albuterol  along with it (that you may still have at home, or any Flovent ). This contains both a steroid and the albuterol .  Technically you should be taking the pepcid  (famotidine ) twice daily. On the days you are eating something fried for dinner, consider taking the pepcid  before dinner, rather than before bedtime.  You may need to take it twice daily (especially if having symptoms after lunch).  Continue to monitor your blood pressure. If it is high, think about if your pain is worse than usual,  or if you had more salty foods.  Try and follow a low sodium diet. Goal blood pressures are 130/80 or less, definitely try and keep it under 140.  Consider pool exercises (swimming, water aerobics) at least while your foot is bothering you.   Try and drink a glass of milk daily, consider fortified milks (have more calcium in it), or try almond/soy or oat milk, as these are also fortified with more calcium.

## 2023-10-19 ENCOUNTER — Encounter: Payer: Self-pay | Admitting: Family Medicine

## 2023-10-19 ENCOUNTER — Ambulatory Visit: Payer: PPO | Admitting: Family Medicine

## 2023-10-19 VITALS — BP 120/60 | HR 64 | Ht 61.0 in | Wt 130.8 lb

## 2023-10-19 DIAGNOSIS — G8929 Other chronic pain: Secondary | ICD-10-CM | POA: Diagnosis not present

## 2023-10-19 DIAGNOSIS — F329 Major depressive disorder, single episode, unspecified: Secondary | ICD-10-CM | POA: Diagnosis not present

## 2023-10-19 DIAGNOSIS — Z13220 Encounter for screening for lipoid disorders: Secondary | ICD-10-CM | POA: Diagnosis not present

## 2023-10-19 DIAGNOSIS — Z0000 Encounter for general adult medical examination without abnormal findings: Secondary | ICD-10-CM | POA: Diagnosis not present

## 2023-10-19 DIAGNOSIS — M810 Age-related osteoporosis without current pathological fracture: Secondary | ICD-10-CM

## 2023-10-19 DIAGNOSIS — M542 Cervicalgia: Secondary | ICD-10-CM

## 2023-10-19 DIAGNOSIS — J4520 Mild intermittent asthma, uncomplicated: Secondary | ICD-10-CM | POA: Diagnosis not present

## 2023-10-19 DIAGNOSIS — K2100 Gastro-esophageal reflux disease with esophagitis, without bleeding: Secondary | ICD-10-CM

## 2023-10-19 DIAGNOSIS — M545 Low back pain, unspecified: Secondary | ICD-10-CM | POA: Diagnosis not present

## 2023-10-19 DIAGNOSIS — I10 Essential (primary) hypertension: Secondary | ICD-10-CM | POA: Diagnosis not present

## 2023-10-19 DIAGNOSIS — E559 Vitamin D deficiency, unspecified: Secondary | ICD-10-CM

## 2023-10-19 DIAGNOSIS — Z5181 Encounter for therapeutic drug level monitoring: Secondary | ICD-10-CM

## 2023-10-19 LAB — COMPREHENSIVE METABOLIC PANEL WITH GFR
ALT: 20 IU/L (ref 0–32)
AST: 22 IU/L (ref 0–40)
Albumin: 4.5 g/dL (ref 3.8–4.8)
Alkaline Phosphatase: 68 IU/L (ref 44–121)
BUN/Creatinine Ratio: 23 (ref 12–28)
BUN: 17 mg/dL (ref 8–27)
Bilirubin Total: 0.4 mg/dL (ref 0.0–1.2)
CO2: 24 mmol/L (ref 20–29)
Calcium: 9.4 mg/dL (ref 8.7–10.3)
Chloride: 97 mmol/L (ref 96–106)
Creatinine, Ser: 0.75 mg/dL (ref 0.57–1.00)
Globulin, Total: 2 g/dL (ref 1.5–4.5)
Glucose: 88 mg/dL (ref 70–99)
Potassium: 4.9 mmol/L (ref 3.5–5.2)
Sodium: 135 mmol/L (ref 134–144)
Total Protein: 6.5 g/dL (ref 6.0–8.5)
eGFR: 80 mL/min/1.73 (ref >59)

## 2023-10-19 LAB — LIPID PANEL
Chol/HDL Ratio: 2.6 ratio (ref 0.0–4.4)
Cholesterol, Total: 213 mg/dL — ABNORMAL HIGH (ref 100–199)
HDL: 83 mg/dL (ref >39)
LDL Chol Calc (NIH): 117 mg/dL — ABNORMAL HIGH (ref 0–99)
Triglycerides: 74 mg/dL (ref 0–149)
VLDL Cholesterol Cal: 13 mg/dL (ref 5–40)

## 2023-10-20 ENCOUNTER — Ambulatory Visit: Payer: Self-pay | Admitting: Family Medicine

## 2023-10-23 ENCOUNTER — Other Ambulatory Visit: Payer: Self-pay

## 2023-10-25 ENCOUNTER — Ambulatory Visit: Payer: Self-pay

## 2023-10-25 NOTE — Telephone Encounter (Signed)
 Copied from CRM [id number]. Topic: Clinical - Medication Question >> Oct 25, 2023 11:53 AM Debby BROCKS wrote: Reason for CRM: Patient was given hydrocodone  for her pain on shoulders, lower back and foot. But she wants to know if there are alternative medications that could work better as the pain is strong

## 2023-10-25 NOTE — Telephone Encounter (Signed)
 Per workflow, routing to practice for no contact X 3 attempts follow up.    Reason for Disposition  Third attempt to contact caller AND no contact made. Phone number verified.  Message left on identified voice mail  Protocols used: No Contact or Duplicate Contact Call-A-AH

## 2023-10-26 ENCOUNTER — Other Ambulatory Visit (HOSPITAL_COMMUNITY): Payer: Self-pay

## 2023-10-31 ENCOUNTER — Other Ambulatory Visit: Payer: Self-pay | Admitting: Family Medicine

## 2023-10-31 ENCOUNTER — Other Ambulatory Visit: Payer: Self-pay

## 2023-10-31 DIAGNOSIS — M810 Age-related osteoporosis without current pathological fracture: Secondary | ICD-10-CM

## 2023-10-31 MED ORDER — PROLIA 60 MG/ML ~~LOC~~ SOSY
60.0000 mg | PREFILLED_SYRINGE | SUBCUTANEOUS | 0 refills | Status: AC
  Filled 2023-10-31 – 2023-11-02 (×3): qty 1, 180d supply, fill #0

## 2023-10-31 NOTE — Telephone Encounter (Signed)
 [name],  Patients get prolias done at [facility] infusion center on [address] now as we no longer get give them

## 2023-11-02 ENCOUNTER — Other Ambulatory Visit: Payer: Self-pay

## 2023-11-02 DIAGNOSIS — J4520 Mild intermittent asthma, uncomplicated: Secondary | ICD-10-CM | POA: Diagnosis not present

## 2023-11-02 DIAGNOSIS — J310 Chronic rhinitis: Secondary | ICD-10-CM | POA: Diagnosis not present

## 2023-11-02 DIAGNOSIS — J4521 Mild intermittent asthma with (acute) exacerbation: Secondary | ICD-10-CM | POA: Diagnosis not present

## 2023-11-02 NOTE — Progress Notes (Signed)
 Infusion clinic doing buy/bill. Dis-enrolling.

## 2023-11-03 ENCOUNTER — Other Ambulatory Visit: Payer: Self-pay

## 2023-11-08 ENCOUNTER — Ambulatory Visit (INDEPENDENT_AMBULATORY_CARE_PROVIDER_SITE_OTHER)

## 2023-11-08 VITALS — BP 134/73 | HR 63 | Temp 97.9°F | Resp 18 | Ht 61.0 in | Wt 128.4 lb

## 2023-11-08 DIAGNOSIS — M816 Localized osteoporosis [Lequesne]: Secondary | ICD-10-CM

## 2023-11-08 MED ORDER — DENOSUMAB 60 MG/ML ~~LOC~~ SOSY
60.0000 mg | PREFILLED_SYRINGE | Freq: Once | SUBCUTANEOUS | Status: AC
  Administered 2023-11-08: 60 mg via SUBCUTANEOUS
  Filled 2023-11-08: qty 1

## 2023-11-08 NOTE — Progress Notes (Signed)
 Diagnosis: Osteoporosis  Provider:  Mannam, Praveen MD  Procedure: Injection  Prolia  (Denosumab ), Dose: 60 mg, Site: subcutaneous, Number of injections: 1  Injection Site(s): Right lower quad. abdomne  Post Care: Pt declined observation.  Discharge: Condition: Good, Destination: Home . AVS Declined  Performed by:  Eivan Gallina, RN

## 2023-11-13 ENCOUNTER — Other Ambulatory Visit: Payer: Self-pay | Admitting: Family Medicine

## 2023-11-13 DIAGNOSIS — Z0000 Encounter for general adult medical examination without abnormal findings: Secondary | ICD-10-CM

## 2023-11-16 ENCOUNTER — Other Ambulatory Visit: Payer: Self-pay

## 2023-11-20 ENCOUNTER — Encounter: Payer: Self-pay | Admitting: Family Medicine

## 2023-11-20 NOTE — Progress Notes (Unsigned)
 Maria Console, PA-C [address] [place], [place]  [address] Phone: [phone]   Primary Care Physician: Randol Dawes, MD  Primary Gastroenterologist:  Maria Console, PA-C / Dr. Gordy Starch   Chief Complaint:  Hemorrhoids       HPI:   [name] is a 78 y.o. female, established patient Dr. Starch, presents for evaluation of hemorrhoids.  She has history of chronic constipation (opioid-induced), anal fissure, internal and external hemorrhoids, and GERD with esophagitis.  Has been treated with MiraLAX , docusate stool softener, fiber supplement, and Dulcolax in the past.  Underwent rectocele surgery 03/2022.  Current symptoms: Patient reports having protrusion from the rectum intermittently for many years.  She attributes this to hemorrhoids.  It is worse after having a hard bowel movement or straining.  She sees mild intermittent bright red blood on the tissue after straining for bowel movement.  She has chronic constipation and takes MiraLAX  1 capful every day with benefit.  She is having soft bowel movement every morning.  She also reports flareup of acid reflux for several weeks.  It started after she took Aleve and then Celebrex  for arthritis.  She has discontinued all NSAIDs.  Currently taking famotidine  40 mg twice daily which is not controlling her reflux.  She is not on PPI.  06/2019 last colonoscopy: Small internal hemorrhoids.  No polyps.  Diverticulosis.  Good prep.  No further colonoscopies were recommended due to advanced age.  08/2014 colonoscopy: 9 mm tubular adenoma and 3 mm hyperplastic polyp removed.  Internal and external hemorrhoids.  06/2019 EGD: 4 cm hiatal hernia, LA grade C esophagitis.  Normal stomach and duodenum.  Famotidine  was increased to 40 mg twice daily.  Repeat EGD 11/2019: 4 cm hiatal hernia.  LA grade a esophagitis (improved) on famotidine  twice daily.  Current Outpatient Medications  Medication Sig Dispense Refill   AIRSUPRA 90-80 MCG/ACT AERO 2  Inhalations every 4 (four) hours as needed (wheezing).     amLODipine  (NORVASC ) 5 MG tablet TAKE 1 TABLET (5 MG TOTAL) BY MOUTH DAILY. 90 tablet 3   b complex vitamins capsule Take 1 capsule by mouth daily.     Calcium Carbonate-Vitamin D  (CALCIUM PLUS VITAMIN D  PO) Take 2 tablets by mouth daily.     Cholecalciferol (VITAMIN D ) 125 MCG (5000 UT) CAPS Take 1 capsule by mouth daily.     clobetasol  ointment (TEMOVATE ) 0.05 % Apply a small amount topically BID x 2 weeks as needed. 60 g 0   denosumab  (PROLIA ) 60 MG/ML SOSY injection Inject 60 mg into the skin every 6 (six) months. 1 mL 0   estradiol  (ESTRACE  VAGINAL) 0.1 MG/GM vaginal cream Apply 1/2 gram to vulva nightly for 2 weeks then decrease to 1/2 gram to vulva two nights a week. 42.5 g 1   famotidine  (PEPCID ) 40 MG tablet TAKE 1 TABLET BY MOUTH TWICE A DAY 180 tablet 1   fluticasone (FLONASE) 50 MCG/ACT nasal spray Place 1 spray into both nostrils as needed.     HYDROcodone -acetaminophen  (NORCO/VICODIN) 5-325 MG tablet Take 0.5 tablets by mouth every 6 (six) hours as needed for moderate pain.     hydrocortisone  (ANUSOL -HC) 25 MG suppository Place 1 suppository (25 mg total) rectally at bedtime for 24 days. 12 suppository 1   lamoTRIgine  (LAMICTAL ) 150 MG tablet Take 150 mg by mouth at bedtime.     Liniments (SALONPAS PAIN RELIEF PATCH EX) Place 1 patch onto the skin daily as needed (pain).  Multiple Vitamins-Minerals (CENTRUM SILVER 50+WOMEN PO) Take 1 tablet by mouth.     Multiple Vitamins-Minerals (HAIR SKIN AND NAILS FORMULA PO) Take 3 capsules by mouth daily.     pantoprazole  (PROTONIX ) 40 MG tablet Take 1 tablet (40 mg total) by mouth daily. 90 tablet 3   tretinoin (RETIN-A) 0.025 % cream APPLY TO AFFECTED AREA AT BEDTIME     zolpidem  (AMBIEN ) 10 MG tablet Take 3 mg by mouth at bedtime.     nystatin  cream (MYCOSTATIN ) Apply 1 Application topically 2 (two) times daily. On rectum 30 g 1   No current facility-administered medications for  this visit.    Allergies as of 11/21/2023 - Review Complete 11/21/2023  Allergen Reaction Noted   Codeine Other (See Comments) 01/31/2021   Cymbalta [duloxetine hcl]  04/09/2013   Nitrofurantoin [macrodantin]  07/07/2010   Sulfamethoxazole Hives 05/13/2015   Cefdinir Rash and Dermatitis 11/21/2023    Past Medical History:  Diagnosis Date   Anal fissure    Anxiety    sees [facility] Mackie, [place])   Asthma    Colon polyps 08/2014   tubular adenoma and hyperplastic polyps (Dr. Albertus)   Cystocele with rectocele 2023   DDD (degenerative disc disease), cervical dr gust   s/p surgery, ongoing pain   Depression    h/o hospitalization with ECT   Diverticulosis    Esophagitis    Hiatal hernia    History of insomnia    Hypertension    Internal hemorrhoids    Neck pain    Osteopenia stopped fosamax  ~2006   Post-operative nausea and vomiting    Vertigo    Vitamin D  deficiency borderline(28)-2010   treated by her GYN    Past Surgical History:  Procedure Laterality Date   CATARACT EXTRACTION, BILATERAL Bilateral Sept/October 2019   HEMORRHOID BANDING  12/2020   Dr. Albertus   HEMORRHOID SURGERY  2006, 12/2014   KNEE ARTHROSCOPY Right    LASIK     Dr. Meridee   NASAL SEPTOPLASTY W/ TURBINOPLASTY  1986   NECK SURGERY  02/2010([facility])1/03,2005([facility])   RECTOCELE REPAIR  04/04/2022   [facility]   REDUCTION MAMMAPLASTY Bilateral 07/24/2008   TONSILLECTOMY     TOTAL HIP ARTHROPLASTY Left 08/2007       TOTAL SHOULDER ARTHROPLASTY Left 08/30/2018   Procedure: TOTAL SHOULDER ARTHROPLASTY;  Surgeon: Melita Drivers, MD;  Location: [facility];  Service: Orthopedics;  Laterality: Left;  , move scope to 12:30pm    Review of Systems:    All systems reviewed and negative except where noted in HPI.    Physical Exam:  BP 128/70 (BP Location: Left Arm, Patient Position: Sitting, Cuff Size: Normal)   Pulse 76   Ht 5' 0.5 (1.537 m)   Wt 131 lb 8 oz (59.6 kg)   LMP 08/15/1988 (Exact  Date)   BMI 25.26 kg/m  Patient's last menstrual period was 08/15/1988 (exact date).  General: Well-nourished, well-developed in no acute distress.  Lungs: Clear to auscultation bilaterally. Non-labored. Heart: Regular rate and rhythm, no murmurs rubs or gallops.  Abdomen: Bowel sounds are normal; Abdomen is Soft; No hepatosplenomegaly, masses or hernias;  No Abdominal Tenderness; No guarding or rebound tenderness. Rectal: Mild perianal dermatitis consistent with Candida.  There are no external hemorrhoids.  No anal fissure.  No rectal masses.  Soft brown stool in the vault.  Normal anal sphincter tone. Neuro: Alert and oriented x 3.  Grossly intact.  Psych: Alert and cooperative, normal mood and affect.  Chaperone for Exam:  Alethea Blocker, CMA    Imaging Studies: No results found.  Labs: CBC    Component Value Date/Time   WBC 6.7 06/15/2023 1631   WBC 6.4 08/27/2018 1514   RBC 3.99 06/15/2023 1631   RBC 4.22 08/27/2018 1514   HGB 11.9 06/15/2023 1631   HCT 35.5 06/15/2023 1631   PLT 330 06/15/2023 1631   MCV 89 06/15/2023 1631   MCH 29.8 06/15/2023 1631   MCH 29.9 08/27/2018 1514   MCHC 33.5 06/15/2023 1631   MCHC 32.3 08/27/2018 1514   RDW 12.3 06/15/2023 1631   LYMPHSABS 1.6 06/15/2023 1631   MONOABS 400 05/23/2016 1110   EOSABS 0.1 06/15/2023 1631   BASOSABS 0.0 06/15/2023 1631    CMP     Component Value Date/Time   NA 135 10/19/2023 0950   K 4.9 10/19/2023 0950   CL 97 10/19/2023 0950   CO2 24 10/19/2023 0950   GLUCOSE 88 10/19/2023 0950   GLUCOSE 105 (H) 08/27/2018 1514   BUN 17 10/19/2023 0950   CREATININE 0.75 10/19/2023 0950   CREATININE 0.65 05/23/2016 1110   CALCIUM 9.4 10/19/2023 0950   PROT 6.5 10/19/2023 0950   ALBUMIN 4.5 10/19/2023 0950   AST 22 10/19/2023 0950   ALT 20 10/19/2023 0950   ALKPHOS 68 10/19/2023 0950   BILITOT 0.4 10/19/2023 0950   GFRNONAA 90 12/18/2019 0000   GFRAA 104 12/18/2019 0000       Assessment and Plan:    Lulu Avino is a 78 y.o. y/o female presents for evaluation of chronic GI symptoms:  1.  Internal hemorrhoids - Rx Hydrocortisone  Suppositories 25mg  Insert 1 into rectum once daily at bedtime for 10-14 days. - Stressed importance of treating underlying constipation. - Avoid Sitting on the toilet for prolonged amount of time. - Consider internal Hemorrhoid Banding if no improvement with conservative treament.   2.  Perianal candidiasis - Rx nystatin  cream apply 2-3 times daily as needed.  3.  Chronic constipation - Continue MiraLAX  daily.  She is advised to increase to 1-1/2 capfuls daily.  4.  GERD with esophagitis - Continue famotidine  40 mg twice daily - Start Rx pantoprazole  40 mg 1 tablet once daily. - Recommend Lifestyle Modifications to prevent Acid Reflux.  Rec. Avoid coffee, sodas, peppermint, garlic, onions, alcohol, citrus fruits, chocolate, tomatoes, fatty and spicey foods.  Avoid eating 2-3 hours before bedtime.   - Consider repeat EGD if symptoms worsen or persist.   Maria Console, PA-C  Follow up in 6 weeks with [facility] to assess response to treatment.

## 2023-11-21 ENCOUNTER — Encounter: Payer: Self-pay | Admitting: Physician Assistant

## 2023-11-21 ENCOUNTER — Ambulatory Visit: Admitting: Physician Assistant

## 2023-11-21 VITALS — BP 128/70 | HR 76 | Ht 60.5 in | Wt 131.5 lb

## 2023-11-21 DIAGNOSIS — B3789 Other sites of candidiasis: Secondary | ICD-10-CM

## 2023-11-21 DIAGNOSIS — K648 Other hemorrhoids: Secondary | ICD-10-CM | POA: Diagnosis not present

## 2023-11-21 DIAGNOSIS — K5909 Other constipation: Secondary | ICD-10-CM

## 2023-11-21 DIAGNOSIS — L309 Dermatitis, unspecified: Secondary | ICD-10-CM

## 2023-11-21 DIAGNOSIS — Z860101 Personal history of adenomatous and serrated colon polyps: Secondary | ICD-10-CM

## 2023-11-21 DIAGNOSIS — K2100 Gastro-esophageal reflux disease with esophagitis, without bleeding: Secondary | ICD-10-CM | POA: Diagnosis not present

## 2023-11-21 DIAGNOSIS — K5904 Chronic idiopathic constipation: Secondary | ICD-10-CM

## 2023-11-21 DIAGNOSIS — K649 Unspecified hemorrhoids: Secondary | ICD-10-CM

## 2023-11-21 MED ORDER — HYDROCORTISONE ACETATE 25 MG RE SUPP: 25.0000 mg | Freq: Every day | RECTAL | 1 refills | Status: AC

## 2023-11-21 MED ORDER — PANTOPRAZOLE SODIUM 40 MG PO TBEC: 40.0000 mg | DELAYED_RELEASE_TABLET | Freq: Every day | ORAL | 3 refills | Status: AC

## 2023-11-21 MED ORDER — NYSTATIN 100000 UNIT/GM EX CREA: 1.0000 | TOPICAL_CREAM | Freq: Two times a day (BID) | CUTANEOUS | 1 refills | Status: AC

## 2023-11-21 NOTE — Patient Instructions (Addendum)
 We have sent the following medications to your pharmacy for you to pick up at your convenience: Hydrocortisone  Suppositories 25 mg at bedtime for 24 nights, Nystatin  Cream twice daily and Pantoprazole  40 mg once daily  Please follow up sooner if symptoms increase or worsen  Due to recent changes in healthcare laws, you may see the results of your imaging and laboratory studies on MyChart before your provider has had a chance to review them.  We understand that in some cases there may be results that are confusing or concerning to you. Not all laboratory results come back in the same time frame and the provider may be waiting for multiple results in order to interpret others.  Please give us  48 hours in order for your provider to thoroughly review all the results before contacting the office for clarification of your results.   Thank you for trusting me with your gastrointestinal care!   Ellouise Console, PA-C _______________________________________________________  If your blood pressure at your visit was 140/90 or greater, please contact your primary care physician to follow up on this.  _______________________________________________________  If you are age 80 or older, your body mass index should be between 23-30. Your Body mass index is 25.26 kg/m. If this is out of the aforementioned range listed, please consider follow up with your Primary Care Provider.  If you are age 80 or younger, your body mass index should be between 19-25. Your Body mass index is 25.26 kg/m. If this is out of the aformentioned range listed, please consider follow up with your Primary Care Provider.   ________________________________________________________  The [place] [facility] providers would like to encourage you to use MYCHART to communicate with providers for non-urgent requests or questions.  Due to long hold times on the telephone, sending your provider a message by [facility] may be a faster and more efficient way to  get a response.  Please allow 48 business hours for a response.  Please remember that this is for non-urgent requests.  _______________________________________________________

## 2023-11-29 ENCOUNTER — Telehealth: Payer: Self-pay | Admitting: *Deleted

## 2023-11-29 ENCOUNTER — Other Ambulatory Visit: Payer: Self-pay | Admitting: *Deleted

## 2023-11-29 DIAGNOSIS — M79671 Pain in right foot: Secondary | ICD-10-CM

## 2023-11-29 DIAGNOSIS — G8929 Other chronic pain: Secondary | ICD-10-CM

## 2023-11-29 DIAGNOSIS — M79641 Pain in right hand: Secondary | ICD-10-CM

## 2023-11-29 NOTE — Telephone Encounter (Signed)
 Chart was reviewed--she saw rheum at [facility] in 11/2022.  In reviewing those notes, it was mainly for her neck pain, and they had nothing additional to offer her.  She is now mentioning pain in hands and feet as well, so okay for referral

## 2023-11-29 NOTE — Telephone Encounter (Signed)
Referral done and patient notified.

## 2023-11-29 NOTE — Telephone Encounter (Signed)
 Copied from CRM #[id number]. Topic: Referral - Request for Referral >> Nov 29, 2023  8:19 AM Myrick T wrote: Did the patient discuss referral with their provider in the last year? yes  Appointment offered? Yes  Type of order/referral and detailed reason for visit: Rheumatologist for hands, foot and back  Preference of office, provider, location: [facility] Rodolfo Pyo   If referral order, have you been seen by this specialty before? No (If Yes, this issue or another issue? When? Where?  Can we respond through MyChart? No   Is this okay? She has CPE next week.

## 2023-11-30 DIAGNOSIS — M19072 Primary osteoarthritis, left ankle and foot: Secondary | ICD-10-CM | POA: Diagnosis not present

## 2023-12-12 NOTE — Telephone Encounter (Signed)
 We did not talk about sleep study. We talked about her being tired (and her sedating pain meds and depression). I don't see mention of specific questions to raise suspicion for sleep apnea (snoring, unrefreshed sleep, any of the STOP-BANG questions I'd like to ask her these questions (STOP-BANG questionnaire), and discuss what sleep apnea means, how it is treated, if positive. It can be a virtual visit.

## 2023-12-12 NOTE — Telephone Encounter (Unsigned)
 Copied from CRM [id number]. Topic: Referral - Request for Referral >> Dec 12, 2023 10:19 AM Jasmin G wrote: Did the patient discuss referral with their provider in the last year? Yes (If No - schedule appointment) (If Yes - send message)  Appointment offered? No  Type of order/referral and detailed reason for visit: Sleep Apnea Testing  Preference of office, provider, location: Not discussed  If referral order, have you been seen by this specialty before? No (If Yes, this issue or another issue? When? Where?  Can we respond through MyChart? No

## 2023-12-12 NOTE — Telephone Encounter (Signed)
 Spoke with patient, she was getting her hair done. She will call tomorrow to schedule (can be virtual).

## 2024-01-02 ENCOUNTER — Ambulatory Visit

## 2024-01-08 DIAGNOSIS — H6693 Otitis media, unspecified, bilateral: Secondary | ICD-10-CM | POA: Diagnosis not present

## 2024-01-08 DIAGNOSIS — J310 Chronic rhinitis: Secondary | ICD-10-CM | POA: Diagnosis not present

## 2024-01-08 DIAGNOSIS — J4521 Mild intermittent asthma with (acute) exacerbation: Secondary | ICD-10-CM | POA: Diagnosis not present

## 2024-01-08 DIAGNOSIS — J4520 Mild intermittent asthma, uncomplicated: Secondary | ICD-10-CM | POA: Diagnosis not present

## 2024-01-15 ENCOUNTER — Telehealth: Payer: Self-pay | Admitting: Physician Assistant

## 2024-01-15 ENCOUNTER — Encounter: Payer: Self-pay | Admitting: Physician Assistant

## 2024-01-15 ENCOUNTER — Ambulatory Visit: Admitting: Physician Assistant

## 2024-01-15 VITALS — BP 100/58 | HR 79 | Ht 61.0 in | Wt 130.5 lb

## 2024-01-15 DIAGNOSIS — H6693 Otitis media, unspecified, bilateral: Secondary | ICD-10-CM | POA: Diagnosis not present

## 2024-01-15 DIAGNOSIS — R079 Chest pain, unspecified: Secondary | ICD-10-CM | POA: Diagnosis not present

## 2024-01-15 DIAGNOSIS — K649 Unspecified hemorrhoids: Secondary | ICD-10-CM

## 2024-01-15 DIAGNOSIS — K2100 Gastro-esophageal reflux disease with esophagitis, without bleeding: Secondary | ICD-10-CM | POA: Diagnosis not present

## 2024-01-15 DIAGNOSIS — J4520 Mild intermittent asthma, uncomplicated: Secondary | ICD-10-CM | POA: Diagnosis not present

## 2024-01-15 DIAGNOSIS — K5903 Drug induced constipation: Secondary | ICD-10-CM

## 2024-01-15 DIAGNOSIS — R052 Subacute cough: Secondary | ICD-10-CM | POA: Diagnosis not present

## 2024-01-15 DIAGNOSIS — R918 Other nonspecific abnormal finding of lung field: Secondary | ICD-10-CM | POA: Diagnosis not present

## 2024-01-15 NOTE — Patient Instructions (Addendum)
 For acid reflux: - Continue pantoprazole  40 mg 1 tablet once daily in the morning before breakfast. - Add famotidine  40 mg once daily before dinner or bedtime if needed for breakthrough acid reflux. - Recommend Lifestyle Modifications to prevent Acid Reflux.  Rec. Avoid coffee, sodas, peppermint, garlic, onions, alcohol, citrus fruits, chocolate, tomatoes, fatty and spicey foods.  Avoid eating 2-3 hours before bedtime.    For constipation: - Start OTC Senokot S 50\8.6 mg 2 tablets daily before bedtime.  You can increase to 2 tablets twice daily if needed. - If Senokot-S does not control constipation, then we can try a prescription medication such as Linzess, Amitiza, Trulance, or Ibsrela.  Just let me know.  You have been scheduled for an Endoscopy. Please follow written instructions given to you at your visit today.  If you use inhalers (even only as needed), please bring them with you on the day of your procedure.  If you take any of the following medications, they will need to be adjusted prior to your procedure:   DO NOT TAKE 7 DAYS PRIOR TO TEST- Trulicity (dulaglutide) Ozempic, Wegovy (semaglutide) Mounjaro (tirzepatide) Bydureon Bcise (exanatide extended release)  DO NOT TAKE 1 DAY PRIOR TO YOUR TEST Rybelsus (semaglutide) Adlyxin (lixisenatide) Victoza (liraglutide) Byetta (exanatide) ___________________________________________________________________________  Please follow up sooner if symptoms increase or worsen  Due to recent changes in healthcare laws, you may see the results of your imaging and laboratory studies on MyChart before your provider has had a chance to review them.  We understand that in some cases there may be results that are confusing or concerning to you. Not all laboratory results come back in the same time frame and the provider may be waiting for multiple results in order to interpret others.  Please give us  48 hours in order for your provider to  thoroughly review all the results before contacting the office for clarification of your results.   Thank you for trusting me with your gastrointestinal care!   Ellouise Console, PA-C _______________________________________________________  If your blood pressure at your visit was 140/90 or greater, please contact your primary care physician to follow up on this.  _______________________________________________________  If you are age 80 or older, your body mass index should be between 23-30. Your Body mass index is 24.66 kg/m. If this is out of the aforementioned range listed, please consider follow up with your Primary Care Provider.  If you are age 80 or younger, your body mass index should be between 19-25. Your Body mass index is 24.66 kg/m. If this is out of the aformentioned range listed, please consider follow up with your Primary Care Provider.   ________________________________________________________  The  GI providers would like to encourage you to use MYCHART to communicate with providers for non-urgent requests or questions.  Due to long hold times on the telephone, sending your provider a message by [facility] may be a faster and more efficient way to get a response.  Please allow 48 business hours for a response.  Please remember that this is for non-urgent requests.  _______________________________________________________

## 2024-01-15 NOTE — Telephone Encounter (Signed)
 PT is calling to schedule an EGD. She stated she picked a date but there is no information to support. Please review and advise with patient.hea

## 2024-01-15 NOTE — Progress Notes (Signed)
 Ellouise Console, PA-C [address] [place], [place]  [address] Phone: [phone]   Primary Care Physician: Randol Dawes, MD  Primary Gastroenterologist:  Ellouise Console, PA-C / Dr. Gordy Starch   Chief Complaint: Follow-up hemorrhoids; evaluate GERD and dysphagia      HPI:   Discussed the use of AI scribe software for clinical note transcription with the patient, who gave verbal consent to proceed.  I last saw her 11/21/2023 to evaluate hemorrhoids.  Has history of chronic opioid-induced constipation treated with MiraLAX .  2 months ago she was given hydrocortisone  suppositories to treat internal hemorrhoids.  Consider hemorrhoid banding if no improvement.  Was also given nystatin  cream for perianal candidiasis.  History of rectocele surgery 03/2022.  History of anal fissure.  She takes pantoprazole  40 mg every morning  for GERD with esophagitis.  She is not taking famotidine . History of Present Illness She experiences difficulty swallowing, describing it as food 'won't go down' and when it does, she feels a burning sensation in her chest and back. This is a new symptom and is concerning as it reminds her of her mother's swallowing difficulties in older age.  She has a history of a 4 cm hiatal hernia and esophagitis diagnosed during an upper endoscopy in 2021. She is currently taking pantoprazole  40 mg once daily for acid reflux, which has been effective in reducing symptoms, although she occasionally experiences heartburn, particularly if she misses a dose. She has not been taking famotidine , although she has a supply available.  Her symptoms are sometimes exacerbated by eating, particularly if she eats quickly or does not chew her food thoroughly. She has been trying to eat slower and chew better to manage her symptoms. She also reports a significant increase in appetite, which she attributes to her medication, and notes a weight gain of 2-3 pounds recently.  She has a history of asthma  and is taking hydrocodone  for pain, which she wonders might be affecting her appetite. She experiences constipation, describing her bowel movements as incomplete and requiring multiple attempts. She has tried using Metamucil and Miralax  but finds them unpalatable. She reports improvement in her hemorrhoids and less bleeding recently.  06/2019 last colonoscopy: Small internal hemorrhoids.  No polyps.  Diverticulosis.  Good prep.  No further colonoscopies were recommended due to advanced age.   08/2014 colonoscopy: 9 mm tubular adenoma and 3 mm hyperplastic polyp removed.  Internal and external hemorrhoids.   06/2019 EGD: 4 cm hiatal hernia, LA grade C esophagitis.  Normal stomach and duodenum.  Famotidine  was increased to 40 mg twice daily.   Repeat EGD 11/2019: 4 cm hiatal hernia.  LA grade a esophagitis (improved) on famotidine  twice daily.    Current Outpatient Medications  Medication Sig Dispense Refill   AIRSUPRA 90-80 MCG/ACT AERO 2 Inhalations every 4 (four) hours as needed (wheezing).     amLODipine  (NORVASC ) 5 MG tablet TAKE 1 TABLET (5 MG TOTAL) BY MOUTH DAILY. 90 tablet 3   b complex vitamins capsule Take 1 capsule by mouth daily.     Calcium Carbonate-Vitamin D  (CALCIUM PLUS VITAMIN D  PO) Take 2 tablets by mouth daily.     cefdinir (OMNICEF) 300 MG capsule 2 (two) times daily.     Cholecalciferol (VITAMIN D ) 125 MCG (5000 UT) CAPS Take 1 capsule by mouth daily.     clobetasol  ointment (TEMOVATE ) 0.05 % Apply a small amount topically BID x 2 weeks as needed. 60 g 0   denosumab  (  PROLIA ) 60 MG/ML SOSY injection Inject 60 mg into the skin every 6 (six) months. 1 mL 0   estradiol  (ESTRACE  VAGINAL) 0.1 MG/GM vaginal cream Apply 1/2 gram to vulva nightly for 2 weeks then decrease to 1/2 gram to vulva two nights a week. 42.5 g 1   famotidine  (PEPCID ) 40 MG tablet TAKE 1 TABLET BY MOUTH TWICE A DAY 180 tablet 1   fluticasone (FLONASE) 50 MCG/ACT nasal spray Place 1 spray into both nostrils as  needed.     HYDROcodone -acetaminophen  (NORCO/VICODIN) 5-325 MG tablet Take 0.5 tablets by mouth every 6 (six) hours as needed for moderate pain.     lamoTRIgine  (LAMICTAL ) 150 MG tablet Take 150 mg by mouth at bedtime.     Liniments (SALONPAS PAIN RELIEF PATCH EX) Place 1 patch onto the skin daily as needed (pain).     Multiple Vitamins-Minerals (CENTRUM SILVER 50+WOMEN PO) Take 1 tablet by mouth.     Multiple Vitamins-Minerals (HAIR SKIN AND NAILS FORMULA PO) Take 3 capsules by mouth daily.     nystatin  cream (MYCOSTATIN ) Apply 1 Application topically 2 (two) times daily. On rectum 30 g 1   pantoprazole  (PROTONIX ) 40 MG tablet Take 1 tablet (40 mg total) by mouth daily. 90 tablet 3   tretinoin (RETIN-A) 0.025 % cream APPLY TO AFFECTED AREA AT BEDTIME     zolpidem  (AMBIEN ) 10 MG tablet Take 3 mg by mouth at bedtime.     No current facility-administered medications for this visit.    Allergies as of 01/15/2024 - Review Complete 01/15/2024  Allergen Reaction Noted   Codeine Other (See Comments) 01/31/2021   Cymbalta [duloxetine hcl]  04/09/2013   Nitrofurantoin [macrodantin]  07/07/2010   Sulfamethoxazole Hives 05/13/2015   Cefdinir Rash and Dermatitis 11/21/2023    Past Medical History:  Diagnosis Date   Anal fissure    Anxiety    sees [facility] G[facility] Mackie, [place])   Asthma    Colon polyps 08/2014   tubular adenoma and hyperplastic polyps (Dr. Albertus)   Cystocele with rectocele 2023   DDD (degenerative disc disease), cervical dr gust   s/p surgery, ongoing pain   Depression    h/o hospitalization with ECT   Diverticulosis    Esophagitis    Hiatal hernia    History of insomnia    Hypertension    Internal hemorrhoids    Neck pain    Osteopenia stopped fosamax  ~2006   Post-operative nausea and vomiting    Vertigo    Vitamin D  deficiency borderline(28)-2010   treated by her GYN    Past Surgical History:  Procedure Laterality Date   CATARACT EXTRACTION, BILATERAL  Bilateral Sept/October 2019   HEMORRHOID BANDING  12/2020   Dr. Albertus   HEMORRHOID SURGERY  2006, 12/2014   KNEE ARTHROSCOPY Right    LASIK     Dr. Meridee   NASAL SEPTOPLASTY W/ TURBINOPLASTY  1986   NECK SURGERY  02/2010([facility])1/03,2005([facility])   RECTOCELE REPAIR  04/04/2022   [facility]   REDUCTION MAMMAPLASTY Bilateral 07/24/2008   TONSILLECTOMY     TOTAL HIP ARTHROPLASTY Left 08/2007       TOTAL SHOULDER ARTHROPLASTY Left 08/30/2018   Procedure: TOTAL SHOULDER ARTHROPLASTY;  Surgeon: Melita Drivers, MD;  Location: [facility];  Service: Orthopedics;  Laterality: Left;  , move scope to 12:30pm    Review of Systems:    All systems reviewed and negative except where noted in HPI.    Physical Exam:  BP (!) 100/58  Pulse 79   Ht 5' 1 (1.549 m)   Wt 130 lb 8 oz (59.2 kg)   LMP 08/15/1988 (Exact Date)   BMI 24.66 kg/m  Patient's last menstrual period was 08/15/1988 (exact date).  General: Well-nourished, well-developed in no acute distress.  Lungs: Clear to auscultation bilaterally. Non-labored. Heart: Regular rate and rhythm, no murmurs rubs or gallops.  Abdomen: Bowel sounds are normal; Abdomen is Soft; No hepatosplenomegaly, masses or hernias;  No Abdominal Tenderness; No guarding or rebound tenderness. Neuro: Alert and oriented x 3.  Grossly intact.  Psych: Alert and cooperative, normal mood and affect.   Imaging Studies: No results found.  Labs: CBC    Component Value Date/Time   WBC 6.7 06/15/2023 1631   WBC 6.4 08/27/2018 1514   RBC 3.99 06/15/2023 1631   RBC 4.22 08/27/2018 1514   HGB 11.9 06/15/2023 1631   HCT 35.5 06/15/2023 1631   PLT 330 06/15/2023 1631   MCV 89 06/15/2023 1631   MCH 29.8 06/15/2023 1631   MCH 29.9 08/27/2018 1514   MCHC 33.5 06/15/2023 1631   MCHC 32.3 08/27/2018 1514   RDW 12.3 06/15/2023 1631   LYMPHSABS 1.6 06/15/2023 1631   MONOABS 400 05/23/2016 1110   EOSABS 0.1 06/15/2023 1631   BASOSABS 0.0 06/15/2023 1631    CMP      Component Value Date/Time   NA 135 10/19/2023 0950   K 4.9 10/19/2023 0950   CL 97 10/19/2023 0950   CO2 24 10/19/2023 0950   GLUCOSE 88 10/19/2023 0950   GLUCOSE 105 (H) 08/27/2018 1514   BUN 17 10/19/2023 0950   CREATININE 0.75 10/19/2023 0950   CREATININE 0.65 05/23/2016 1110   CALCIUM 9.4 10/19/2023 0950   PROT 6.5 10/19/2023 0950   ALBUMIN 4.5 10/19/2023 0950   AST 22 10/19/2023 0950   ALT 20 10/19/2023 0950   ALKPHOS 68 10/19/2023 0950   BILITOT 0.4 10/19/2023 0950   GFRNONAA 90 12/18/2019 0000   GFRAA 104 12/18/2019 0000       Assessment and Plan:   Zyionna Ruud is a 78 y.o. y/o female returns for follow-up of:  1.  GERD with esophagitis: Not controlled on pantoprazole  40 mg daily. -Scheduling repeat EGD for follow-up with esophagitis. - Continue pantoprazole  40 mg once daily every morning. - Add famotidine  40 mg before dinner or bedtime if needed for breakthrough GERD symptoms. - Recommend Lifestyle Modifications to prevent Acid Reflux.  Rec. Avoid coffee, sodas, peppermint, garlic, onions, alcohol, citrus fruits, chocolate, tomatoes, fatty and spicey foods.  Avoid eating 2-3 hours before bedtime.    2.  4 cm Hiatal hernia - Patient education discussed. - Discussed laparoscopic Nissen fundoplication repair as a last resort if we are not able to get acid reflux controlled on medication.  3.  Dysphagia to solid foods; occasional episode - Scheduling EGD with possible dilation I discussed risks of EGD with possible dilation with patient to include risk of bleeding, perforation, and risk of sedation.  Patient expressed understanding and agrees to proceed with EGD.   4.  Hemorrhoids: Greatly improved after using hydrocortisone  suppository. - Stressed the importance of treating underlying constipation.  5.  Chronic opioid-induced constipation - She could not tolerate MiraLAX . - Recommend start OTC Senokot-S, take 2 tablets before bedtime.  Increase to 2 tablets twice  daily if needed. - If Senokot-S does not work, consider Linzess, Amitiza, Trulance, or Ibsrela. - Recommend High Fiber diet with fruits, vegetables, and whole grains. - Drink 64 ounces  of Fluids Daily.   Ellouise Console, PA-C  Follow up with Dr. Albertus in 3 months.

## 2024-01-16 NOTE — Telephone Encounter (Signed)
 I spoke with the patient and got her scheduled for December 30 th at 2:30 pm with Dr.Pyrtle for an EGD.

## 2024-01-16 NOTE — Telephone Encounter (Signed)
 I called the patient and she did not answer a voicemail was left.

## 2024-01-18 ENCOUNTER — Telehealth: Payer: Self-pay

## 2024-01-18 NOTE — Telephone Encounter (Signed)
-----   Message from [facility] T sent at 01/15/2024  3:11 PM EST ----- Call patient to schedule an EGD

## 2024-01-18 NOTE — Telephone Encounter (Signed)
 Patient is scheduled with Dr. Albertus on 12-30 at 2:30pm

## 2024-01-23 ENCOUNTER — Ambulatory Visit

## 2024-01-29 DIAGNOSIS — R079 Chest pain, unspecified: Secondary | ICD-10-CM | POA: Diagnosis not present

## 2024-01-29 DIAGNOSIS — J310 Chronic rhinitis: Secondary | ICD-10-CM | POA: Diagnosis not present

## 2024-01-29 DIAGNOSIS — J4520 Mild intermittent asthma, uncomplicated: Secondary | ICD-10-CM | POA: Diagnosis not present

## 2024-01-29 DIAGNOSIS — J189 Pneumonia, unspecified organism: Secondary | ICD-10-CM | POA: Diagnosis not present

## 2024-01-31 DIAGNOSIS — M542 Cervicalgia: Secondary | ICD-10-CM | POA: Diagnosis not present

## 2024-01-31 DIAGNOSIS — M47812 Spondylosis without myelopathy or radiculopathy, cervical region: Secondary | ICD-10-CM | POA: Diagnosis not present

## 2024-01-31 DIAGNOSIS — M791 Myalgia, unspecified site: Secondary | ICD-10-CM | POA: Diagnosis not present

## 2024-02-14 ENCOUNTER — Ambulatory Visit

## 2024-02-27 ENCOUNTER — Encounter: Admitting: Internal Medicine

## 2024-02-29 ENCOUNTER — Encounter: Payer: Self-pay | Admitting: Family Medicine

## 2024-03-07 ENCOUNTER — Ambulatory Visit
Admission: RE | Admit: 2024-03-07 | Discharge: 2024-03-07 | Disposition: A | Discharge status: Home / Self Care | Source: Ambulatory Visit | Attending: Family Medicine | Admitting: Family Medicine

## 2024-03-07 ENCOUNTER — Encounter: Payer: Self-pay | Admitting: Family Medicine

## 2024-03-07 DIAGNOSIS — Z0000 Encounter for general adult medical examination without abnormal findings: Secondary | ICD-10-CM

## 2024-03-14 ENCOUNTER — Telehealth: Payer: Self-pay | Admitting: Internal Medicine

## 2024-03-14 ENCOUNTER — Encounter: Admitting: Internal Medicine

## 2024-03-14 NOTE — Telephone Encounter (Signed)
 Good Morning Dr. Albertus,  I called this patient today at 8:45am to see if she was coming for her procedure.  I left a message for her to call us  if she was running late or needed to reschedule.    I will NO SHOW her.  [place] adv

## 2024-03-19 ENCOUNTER — Encounter: Admitting: Internal Medicine

## 2024-04-01 ENCOUNTER — Ambulatory Visit: Admitting: Obstetrics and Gynecology

## 2024-04-01 ENCOUNTER — Encounter: Admitting: Internal Medicine

## 2024-04-16 ENCOUNTER — Encounter: Admitting: Internal Medicine

## 2024-04-19 ENCOUNTER — Ambulatory Visit: Admitting: Obstetrics and Gynecology

## 2024-04-26 ENCOUNTER — Encounter: Admitting: Rheumatology

## 2024-05-08 ENCOUNTER — Ambulatory Visit

## 2024-05-24 ENCOUNTER — Ambulatory Visit: Admitting: Rheumatology

## 2024-12-05 ENCOUNTER — Encounter: Payer: Self-pay | Admitting: Family Medicine

## 2024-12-11 ENCOUNTER — Encounter: Admitting: Family Medicine
